# Patient Record
Sex: Female | Born: 1950
Health system: Southern US, Community
[De-identification: ages and names within clinical notes are randomized; demographics above are authoritative.]

## PROBLEM LIST (undated history)

## (undated) DIAGNOSIS — Z9889 Other specified postprocedural states: Secondary | ICD-10-CM

## (undated) DIAGNOSIS — F329 Major depressive disorder, single episode, unspecified: Secondary | ICD-10-CM

## (undated) DIAGNOSIS — K649 Unspecified hemorrhoids: Secondary | ICD-10-CM

## (undated) DIAGNOSIS — N189 Chronic kidney disease, unspecified: Secondary | ICD-10-CM

## (undated) DIAGNOSIS — T7840XA Allergy, unspecified, initial encounter: Secondary | ICD-10-CM

## (undated) DIAGNOSIS — R112 Nausea with vomiting, unspecified: Secondary | ICD-10-CM

## (undated) DIAGNOSIS — K219 Gastro-esophageal reflux disease without esophagitis: Secondary | ICD-10-CM

## (undated) DIAGNOSIS — E785 Hyperlipidemia, unspecified: Secondary | ICD-10-CM

## (undated) DIAGNOSIS — F32A Depression, unspecified: Secondary | ICD-10-CM

## (undated) DIAGNOSIS — M545 Low back pain, unspecified: Secondary | ICD-10-CM

## (undated) DIAGNOSIS — R011 Cardiac murmur, unspecified: Secondary | ICD-10-CM

## (undated) DIAGNOSIS — I341 Nonrheumatic mitral (valve) prolapse: Secondary | ICD-10-CM

## (undated) DIAGNOSIS — I493 Ventricular premature depolarization: Secondary | ICD-10-CM

## (undated) DIAGNOSIS — I10 Essential (primary) hypertension: Secondary | ICD-10-CM

## (undated) DIAGNOSIS — M1990 Unspecified osteoarthritis, unspecified site: Secondary | ICD-10-CM

## (undated) DIAGNOSIS — D649 Anemia, unspecified: Secondary | ICD-10-CM

## (undated) DIAGNOSIS — F419 Anxiety disorder, unspecified: Secondary | ICD-10-CM

## (undated) DIAGNOSIS — L509 Urticaria, unspecified: Secondary | ICD-10-CM

## (undated) HISTORY — PX: OTHER SURGICAL HISTORY: SHX169

## (undated) HISTORY — DX: Gastro-esophageal reflux disease without esophagitis: K21.9

## (undated) HISTORY — PX: HEMORRHOID SURGERY: SHX153

## (undated) HISTORY — DX: Urticaria, unspecified: L50.9

## (undated) HISTORY — DX: Unspecified osteoarthritis, unspecified site: M19.90

## (undated) HISTORY — DX: Ventricular premature depolarization: I49.3

## (undated) HISTORY — DX: Essential (primary) hypertension: I10

## (undated) HISTORY — DX: Major depressive disorder, single episode, unspecified: F32.9

## (undated) HISTORY — DX: Unspecified hemorrhoids: K64.9

## (undated) HISTORY — DX: Anemia, unspecified: D64.9

## (undated) HISTORY — DX: Low back pain, unspecified: M54.50

## (undated) HISTORY — DX: Low back pain: M54.5

## (undated) HISTORY — DX: Nonrheumatic mitral (valve) prolapse: I34.1

## (undated) HISTORY — DX: Allergy, unspecified, initial encounter: T78.40XA

## (undated) HISTORY — DX: Cardiac murmur, unspecified: R01.1

## (undated) HISTORY — DX: Hyperlipidemia, unspecified: E78.5

## (undated) HISTORY — DX: Depression, unspecified: F32.A

## (undated) HISTORY — PX: TONSILLECTOMY: SUR1361

## (undated) HISTORY — DX: Anxiety disorder, unspecified: F41.9

---

## 1982-11-12 HISTORY — PX: ABDOMINAL HYSTERECTOMY: SHX81

## 1999-04-21 ENCOUNTER — Other Ambulatory Visit: Admission: RE | Admit: 1999-04-21 | Discharge: 1999-04-21 | Payer: Self-pay | Admitting: Gynecology

## 1999-07-10 ENCOUNTER — Ambulatory Visit (HOSPITAL_COMMUNITY): Admission: RE | Admit: 1999-07-10 | Discharge: 1999-07-10 | Payer: Self-pay | Admitting: Gynecology

## 1999-07-10 ENCOUNTER — Encounter: Payer: Self-pay | Admitting: Gynecology

## 1999-12-27 ENCOUNTER — Encounter: Admission: RE | Admit: 1999-12-27 | Discharge: 2000-03-26 | Payer: Self-pay | Admitting: *Deleted

## 2000-01-31 ENCOUNTER — Encounter: Payer: Self-pay | Admitting: *Deleted

## 2000-01-31 ENCOUNTER — Encounter: Admission: RE | Admit: 2000-01-31 | Discharge: 2000-01-31 | Payer: Self-pay | Admitting: *Deleted

## 2000-03-27 ENCOUNTER — Encounter: Admission: RE | Admit: 2000-03-27 | Discharge: 2000-04-29 | Payer: Self-pay | Admitting: *Deleted

## 2000-06-14 ENCOUNTER — Other Ambulatory Visit: Admission: RE | Admit: 2000-06-14 | Discharge: 2000-06-14 | Payer: Self-pay | Admitting: Gynecology

## 2000-07-02 ENCOUNTER — Ambulatory Visit (HOSPITAL_COMMUNITY)
Admission: RE | Admit: 2000-07-02 | Discharge: 2000-07-02 | Payer: Self-pay | Admitting: Physical Medicine & Rehabilitation

## 2000-07-11 ENCOUNTER — Ambulatory Visit (HOSPITAL_COMMUNITY): Admission: RE | Admit: 2000-07-11 | Discharge: 2000-07-11 | Payer: Self-pay | Admitting: Gynecology

## 2000-07-11 ENCOUNTER — Encounter
Admission: RE | Admit: 2000-07-11 | Discharge: 2000-08-23 | Payer: Self-pay | Admitting: Physical Medicine & Rehabilitation

## 2000-07-11 ENCOUNTER — Encounter: Payer: Self-pay | Admitting: Gynecology

## 2000-11-27 ENCOUNTER — Emergency Department (HOSPITAL_COMMUNITY): Admission: EM | Admit: 2000-11-27 | Discharge: 2000-11-28 | Payer: Self-pay | Admitting: Internal Medicine

## 2000-12-06 ENCOUNTER — Ambulatory Visit (HOSPITAL_COMMUNITY)
Admission: RE | Admit: 2000-12-06 | Discharge: 2000-12-06 | Payer: Self-pay | Admitting: Physical Medicine & Rehabilitation

## 2000-12-06 ENCOUNTER — Encounter: Payer: Self-pay | Admitting: Physical Medicine & Rehabilitation

## 2001-07-01 ENCOUNTER — Other Ambulatory Visit: Admission: RE | Admit: 2001-07-01 | Discharge: 2001-07-01 | Payer: Self-pay | Admitting: Gynecology

## 2002-06-23 ENCOUNTER — Emergency Department (HOSPITAL_COMMUNITY): Admission: EM | Admit: 2002-06-23 | Discharge: 2002-06-23 | Payer: Self-pay | Admitting: Emergency Medicine

## 2002-07-16 LAB — HM COLONOSCOPY: HM Colonoscopy: ABNORMAL

## 2002-08-02 ENCOUNTER — Emergency Department (HOSPITAL_COMMUNITY): Admission: EM | Admit: 2002-08-02 | Discharge: 2002-08-02 | Payer: Self-pay | Admitting: Emergency Medicine

## 2002-08-27 ENCOUNTER — Other Ambulatory Visit: Admission: RE | Admit: 2002-08-27 | Discharge: 2002-08-27 | Payer: Self-pay | Admitting: Gynecology

## 2002-09-11 ENCOUNTER — Encounter: Payer: Self-pay | Admitting: Gynecology

## 2002-09-11 ENCOUNTER — Ambulatory Visit (HOSPITAL_COMMUNITY): Admission: RE | Admit: 2002-09-11 | Discharge: 2002-09-11 | Payer: Self-pay | Admitting: Gynecology

## 2002-11-12 HISTORY — PX: ROTATOR CUFF REPAIR: SHX139

## 2003-02-19 ENCOUNTER — Emergency Department (HOSPITAL_COMMUNITY): Admission: EM | Admit: 2003-02-19 | Discharge: 2003-02-19 | Payer: Self-pay | Admitting: Emergency Medicine

## 2003-09-22 ENCOUNTER — Encounter: Admission: RE | Admit: 2003-09-22 | Discharge: 2003-11-12 | Payer: Self-pay | Admitting: Orthopedic Surgery

## 2003-10-19 ENCOUNTER — Other Ambulatory Visit: Admission: RE | Admit: 2003-10-19 | Discharge: 2003-10-19 | Payer: Self-pay | Admitting: Gynecology

## 2003-11-13 ENCOUNTER — Encounter: Admission: RE | Admit: 2003-11-13 | Discharge: 2004-01-13 | Payer: Self-pay | Admitting: Orthopedic Surgery

## 2004-08-30 ENCOUNTER — Ambulatory Visit (HOSPITAL_COMMUNITY): Admission: RE | Admit: 2004-08-30 | Discharge: 2004-08-30 | Payer: Self-pay | Admitting: Gynecology

## 2004-09-19 ENCOUNTER — Ambulatory Visit: Payer: Self-pay | Admitting: Internal Medicine

## 2004-11-02 ENCOUNTER — Other Ambulatory Visit: Admission: RE | Admit: 2004-11-02 | Discharge: 2004-11-02 | Payer: Self-pay | Admitting: Gynecology

## 2004-12-19 ENCOUNTER — Ambulatory Visit: Payer: Self-pay | Admitting: Internal Medicine

## 2005-02-28 ENCOUNTER — Ambulatory Visit: Payer: Self-pay | Admitting: Internal Medicine

## 2005-08-30 ENCOUNTER — Ambulatory Visit: Payer: Self-pay | Admitting: Internal Medicine

## 2005-09-05 ENCOUNTER — Ambulatory Visit (HOSPITAL_COMMUNITY): Admission: RE | Admit: 2005-09-05 | Discharge: 2005-09-05 | Payer: Self-pay | Admitting: Gynecology

## 2006-02-28 ENCOUNTER — Ambulatory Visit: Payer: Self-pay | Admitting: Internal Medicine

## 2006-03-19 ENCOUNTER — Ambulatory Visit: Payer: Self-pay | Admitting: Professional

## 2006-04-02 ENCOUNTER — Ambulatory Visit: Payer: Self-pay | Admitting: Professional

## 2006-04-16 ENCOUNTER — Ambulatory Visit: Payer: Self-pay | Admitting: Professional

## 2006-09-10 ENCOUNTER — Ambulatory Visit (HOSPITAL_COMMUNITY): Admission: RE | Admit: 2006-09-10 | Discharge: 2006-09-10 | Payer: Self-pay | Admitting: Gynecology

## 2006-11-11 ENCOUNTER — Emergency Department (HOSPITAL_COMMUNITY): Admission: EM | Admit: 2006-11-11 | Discharge: 2006-11-11 | Payer: Self-pay | Admitting: Family Medicine

## 2006-11-21 ENCOUNTER — Ambulatory Visit: Payer: Self-pay | Admitting: Internal Medicine

## 2006-11-21 LAB — CONVERTED CEMR LAB
Chol/HDL Ratio, serum: 4.9
Cholesterol: 202 mg/dL (ref 0–200)
HDL: 40.9 mg/dL (ref >39.0)
TSH: 1.14 microintl units/mL (ref 0.35–5.50)
Triglyceride fasting, serum: 123 mg/dL (ref 0–149)
VLDL: 25 mg/dL (ref 0–40)

## 2006-12-05 ENCOUNTER — Ambulatory Visit: Payer: Self-pay | Admitting: Internal Medicine

## 2007-03-13 HISTORY — PX: KNEE SURGERY: SHX244

## 2007-03-15 ENCOUNTER — Ambulatory Visit (HOSPITAL_COMMUNITY): Admission: RE | Admit: 2007-03-15 | Discharge: 2007-03-15 | Payer: Self-pay | Admitting: Orthopedic Surgery

## 2007-04-10 ENCOUNTER — Ambulatory Visit (HOSPITAL_COMMUNITY): Admission: RE | Admit: 2007-04-10 | Discharge: 2007-04-10 | Payer: Self-pay | Admitting: Orthopedic Surgery

## 2007-06-10 ENCOUNTER — Emergency Department (HOSPITAL_COMMUNITY): Admission: EM | Admit: 2007-06-10 | Discharge: 2007-06-10 | Payer: Self-pay | Admitting: Family Medicine

## 2007-07-16 ENCOUNTER — Ambulatory Visit: Payer: Self-pay | Admitting: Internal Medicine

## 2007-09-12 ENCOUNTER — Ambulatory Visit (HOSPITAL_COMMUNITY): Admission: RE | Admit: 2007-09-12 | Discharge: 2007-09-12 | Payer: Self-pay | Admitting: Gynecology

## 2007-09-30 ENCOUNTER — Encounter: Payer: Self-pay | Admitting: Internal Medicine

## 2007-09-30 ENCOUNTER — Ambulatory Visit: Payer: Self-pay | Admitting: Internal Medicine

## 2007-09-30 DIAGNOSIS — F4321 Adjustment disorder with depressed mood: Secondary | ICD-10-CM | POA: Insufficient documentation

## 2007-09-30 DIAGNOSIS — I10 Essential (primary) hypertension: Secondary | ICD-10-CM

## 2007-09-30 DIAGNOSIS — H9209 Otalgia, unspecified ear: Secondary | ICD-10-CM | POA: Insufficient documentation

## 2007-09-30 DIAGNOSIS — F411 Generalized anxiety disorder: Secondary | ICD-10-CM

## 2007-09-30 DIAGNOSIS — F419 Anxiety disorder, unspecified: Secondary | ICD-10-CM | POA: Insufficient documentation

## 2007-09-30 LAB — CONVERTED CEMR LAB
ALT: 11 units/L (ref 0–35)
Albumin: 3.9 g/dL (ref 3.5–5.2)
Alkaline Phosphatase: 72 units/L (ref 39–117)
BUN: 10 mg/dL (ref 6–23)
Basophils Absolute: 0 10*3/uL (ref 0.0–0.1)
Basophils Relative: 0.8 % (ref 0.0–1.0)
CO2: 30 meq/L (ref 19–32)
Calcium: 9.4 mg/dL (ref 8.4–10.5)
Cholesterol: 229 mg/dL (ref 0–200)
Creatinine, Ser: 0.8 mg/dL (ref 0.4–1.2)
GFR calc Af Amer: 95 mL/min
HDL: 49.7 mg/dL (ref >39.0)
Hemoglobin: 12.1 g/dL (ref 12.0–15.0)
MCHC: 33.8 g/dL (ref 30.0–36.0)
Monocytes Absolute: 0.6 10*3/uL (ref 0.2–0.7)
Monocytes Relative: 12.6 % — ABNORMAL HIGH (ref 3.0–11.0)
Neutro Abs: 2.7 10*3/uL (ref 1.4–7.7)
Platelets: 210 10*3/uL (ref 150–400)
Potassium: 3.8 meq/L (ref 3.5–5.1)
RDW: 13.6 % (ref 11.5–14.6)
TSH: 1.38 microintl units/mL (ref 0.35–5.50)
Total CHOL/HDL Ratio: 4.6
VLDL: 20 mg/dL (ref 0–40)

## 2007-10-01 ENCOUNTER — Encounter: Payer: Self-pay | Admitting: Internal Medicine

## 2007-10-02 ENCOUNTER — Telehealth: Payer: Self-pay | Admitting: Internal Medicine

## 2008-02-13 ENCOUNTER — Ambulatory Visit: Payer: Self-pay | Admitting: Internal Medicine

## 2008-02-13 DIAGNOSIS — J309 Allergic rhinitis, unspecified: Secondary | ICD-10-CM | POA: Insufficient documentation

## 2008-02-13 DIAGNOSIS — M25519 Pain in unspecified shoulder: Secondary | ICD-10-CM

## 2008-02-26 ENCOUNTER — Emergency Department (HOSPITAL_COMMUNITY): Admission: EM | Admit: 2008-02-26 | Discharge: 2008-02-26 | Payer: Self-pay | Admitting: Emergency Medicine

## 2008-03-30 ENCOUNTER — Ambulatory Visit: Payer: Self-pay | Admitting: Internal Medicine

## 2008-03-30 DIAGNOSIS — M25569 Pain in unspecified knee: Secondary | ICD-10-CM

## 2008-04-09 ENCOUNTER — Ambulatory Visit: Payer: Self-pay | Admitting: Internal Medicine

## 2008-04-09 DIAGNOSIS — H6690 Otitis media, unspecified, unspecified ear: Secondary | ICD-10-CM | POA: Insufficient documentation

## 2008-04-09 DIAGNOSIS — R42 Dizziness and giddiness: Secondary | ICD-10-CM

## 2008-06-15 ENCOUNTER — Ambulatory Visit: Payer: Self-pay | Admitting: Internal Medicine

## 2008-09-24 ENCOUNTER — Ambulatory Visit: Payer: Self-pay | Admitting: Internal Medicine

## 2008-09-26 LAB — CONVERTED CEMR LAB
CO2: 30 meq/L (ref 19–32)
Chloride: 107 meq/L (ref 96–112)
Glucose, Bld: 110 mg/dL — ABNORMAL HIGH (ref 70–99)
Potassium: 3.7 meq/L (ref 3.5–5.1)
Sodium: 142 meq/L (ref 135–145)

## 2008-10-12 ENCOUNTER — Ambulatory Visit: Payer: Self-pay | Admitting: Internal Medicine

## 2008-11-12 DIAGNOSIS — F419 Anxiety disorder, unspecified: Secondary | ICD-10-CM

## 2008-11-12 HISTORY — DX: Anxiety disorder, unspecified: F41.9

## 2009-01-10 ENCOUNTER — Telehealth: Payer: Self-pay | Admitting: Internal Medicine

## 2009-02-16 ENCOUNTER — Ambulatory Visit: Payer: Self-pay | Admitting: Internal Medicine

## 2009-02-16 LAB — CONVERTED CEMR LAB
BUN: 13 mg/dL (ref 6–23)
CO2: 32 meq/L (ref 19–32)
Chloride: 103 meq/L (ref 96–112)
GFR calc non Af Amer: 82.25 mL/min (ref >60)
Glucose, Bld: 109 mg/dL — ABNORMAL HIGH (ref 70–99)
Potassium: 3.9 meq/L (ref 3.5–5.1)
Sodium: 142 meq/L (ref 135–145)

## 2009-02-24 ENCOUNTER — Ambulatory Visit: Payer: Self-pay | Admitting: Internal Medicine

## 2009-02-24 DIAGNOSIS — E559 Vitamin D deficiency, unspecified: Secondary | ICD-10-CM

## 2009-02-28 LAB — CONVERTED CEMR LAB
AST: 24 units/L (ref 0–37)
Albumin: 4.4 g/dL (ref 3.5–5.2)
Alkaline Phosphatase: 76 units/L (ref 39–117)
Basophils Relative: 0.2 % (ref 0.0–3.0)
CO2: 32 meq/L (ref 19–32)
Calcium: 10 mg/dL (ref 8.4–10.5)
Chloride: 100 meq/L (ref 96–112)
Eosinophils Absolute: 0.1 10*3/uL (ref 0.0–0.7)
HCT: 36.9 % (ref 36.0–46.0)
Hemoglobin: 12.5 g/dL (ref 12.0–15.0)
Lymphocytes Relative: 38.3 % (ref 12.0–46.0)
Lymphs Abs: 2.1 10*3/uL (ref 0.7–4.0)
MCHC: 33.9 g/dL (ref 30.0–36.0)
Neutro Abs: 2.7 10*3/uL (ref 1.4–7.7)
Potassium: 3.4 meq/L — ABNORMAL LOW (ref 3.5–5.1)
RBC: 4.6 M/uL (ref 3.87–5.11)
Sodium: 139 meq/L (ref 135–145)
Total Protein: 8.1 g/dL (ref 6.0–8.3)
Vitamin B-12: 397 pg/mL (ref 211–911)

## 2009-05-18 ENCOUNTER — Telehealth: Payer: Self-pay | Admitting: Internal Medicine

## 2009-06-02 ENCOUNTER — Telehealth: Payer: Self-pay | Admitting: Internal Medicine

## 2009-07-05 ENCOUNTER — Ambulatory Visit: Payer: Self-pay | Admitting: Internal Medicine

## 2009-09-07 ENCOUNTER — Ambulatory Visit (HOSPITAL_COMMUNITY): Admission: RE | Admit: 2009-09-07 | Discharge: 2009-09-07 | Payer: Self-pay | Admitting: Gynecology

## 2009-10-19 ENCOUNTER — Ambulatory Visit: Payer: Self-pay | Admitting: Internal Medicine

## 2009-10-20 LAB — CONVERTED CEMR LAB
BUN: 12 mg/dL (ref 6–23)
Basophils Absolute: 0.1 10*3/uL (ref 0.0–0.1)
Calcium: 9.3 mg/dL (ref 8.4–10.5)
Eosinophils Absolute: 0.1 10*3/uL (ref 0.0–0.7)
GFR calc non Af Amer: 94.01 mL/min (ref >60)
Glucose, Bld: 116 mg/dL — ABNORMAL HIGH (ref 70–99)
Lymphocytes Relative: 37.8 % (ref 12.0–46.0)
MCHC: 33.2 g/dL (ref 30.0–36.0)
Neutrophils Relative %: 45 % (ref 43.0–77.0)
RDW: 13.5 % (ref 11.5–14.6)
Sodium: 147 meq/L — ABNORMAL HIGH (ref 135–145)

## 2009-10-25 ENCOUNTER — Ambulatory Visit: Payer: Self-pay | Admitting: Internal Medicine

## 2009-10-25 DIAGNOSIS — M545 Low back pain: Secondary | ICD-10-CM | POA: Insufficient documentation

## 2009-10-25 DIAGNOSIS — R7309 Other abnormal glucose: Secondary | ICD-10-CM

## 2009-11-05 ENCOUNTER — Emergency Department (HOSPITAL_COMMUNITY): Admission: EM | Admit: 2009-11-05 | Discharge: 2009-11-05 | Payer: Self-pay | Admitting: Emergency Medicine

## 2009-11-07 ENCOUNTER — Emergency Department (HOSPITAL_COMMUNITY): Admission: EM | Admit: 2009-11-07 | Discharge: 2009-11-07 | Payer: Self-pay | Admitting: Emergency Medicine

## 2009-12-15 ENCOUNTER — Ambulatory Visit: Payer: Self-pay | Admitting: Internal Medicine

## 2009-12-15 DIAGNOSIS — R634 Abnormal weight loss: Secondary | ICD-10-CM

## 2009-12-15 DIAGNOSIS — J209 Acute bronchitis, unspecified: Secondary | ICD-10-CM | POA: Insufficient documentation

## 2009-12-29 ENCOUNTER — Telehealth: Payer: Self-pay | Admitting: Internal Medicine

## 2010-01-09 ENCOUNTER — Telehealth: Payer: Self-pay | Admitting: Internal Medicine

## 2010-02-23 ENCOUNTER — Ambulatory Visit: Payer: Self-pay | Admitting: Internal Medicine

## 2010-02-23 LAB — CONVERTED CEMR LAB
BUN: 18 mg/dL (ref 6–23)
Basophils Absolute: 0.1 10*3/uL (ref 0.0–0.1)
Basophils Relative: 1.2 % (ref 0.0–3.0)
CO2: 30 meq/L (ref 19–32)
Calcium: 9.5 mg/dL (ref 8.4–10.5)
Chloride: 102 meq/L (ref 96–112)
Creatinine, Ser: 1 mg/dL (ref 0.4–1.2)
Eosinophils Absolute: 0.2 10*3/uL (ref 0.0–0.7)
Eosinophils Relative: 4.3 % (ref 0.0–5.0)
GFR calc non Af Amer: 72.58 mL/min (ref >60)
Glucose, Bld: 107 mg/dL — ABNORMAL HIGH (ref 70–99)
HCT: 34.9 % — ABNORMAL LOW (ref 36.0–46.0)
Hemoglobin: 11.7 g/dL — ABNORMAL LOW (ref 12.0–15.0)
Iron: 53 ug/dL (ref 42–145)
Lymphocytes Relative: 34.8 % (ref 12.0–46.0)
Lymphs Abs: 1.8 10*3/uL (ref 0.7–4.0)
MCHC: 33.4 g/dL (ref 30.0–36.0)
MCV: 79.1 fL (ref 78.0–100.0)
Monocytes Absolute: 0.8 10*3/uL (ref 0.1–1.0)
Monocytes Relative: 15.1 % — ABNORMAL HIGH (ref 3.0–12.0)
Neutro Abs: 2.3 10*3/uL (ref 1.4–7.7)
Neutrophils Relative %: 44.6 % (ref 43.0–77.0)
Platelets: 200 10*3/uL (ref 150.0–400.0)
Potassium: 4.4 meq/L (ref 3.5–5.1)
RBC: 4.41 M/uL (ref 3.87–5.11)
RDW: 14.6 % (ref 11.5–14.6)
Saturation Ratios: 16 % — ABNORMAL LOW (ref 20.0–50.0)
Sodium: 140 meq/L (ref 135–145)
Transferrin: 236 mg/dL (ref 212.0–360.0)
WBC: 5.2 10*3/uL (ref 4.5–10.5)

## 2010-03-01 ENCOUNTER — Ambulatory Visit: Payer: Self-pay | Admitting: Internal Medicine

## 2010-03-30 ENCOUNTER — Ambulatory Visit: Payer: Self-pay | Admitting: Internal Medicine

## 2010-03-30 DIAGNOSIS — M6530 Trigger finger, unspecified finger: Secondary | ICD-10-CM | POA: Insufficient documentation

## 2010-06-29 ENCOUNTER — Ambulatory Visit: Payer: Self-pay | Admitting: Internal Medicine

## 2010-06-29 LAB — CONVERTED CEMR LAB
BUN: 16 mg/dL (ref 6–23)
Chloride: 101 meq/L (ref 96–112)
Hgb A1c MFr Bld: 5.7 % (ref 4.6–6.5)
Potassium: 4.6 meq/L (ref 3.5–5.1)
Sodium: 141 meq/L (ref 135–145)

## 2010-07-05 ENCOUNTER — Ambulatory Visit: Payer: Self-pay | Admitting: Internal Medicine

## 2010-09-08 ENCOUNTER — Ambulatory Visit (HOSPITAL_COMMUNITY): Admission: RE | Admit: 2010-09-08 | Discharge: 2010-09-08 | Payer: Self-pay | Admitting: Gynecology

## 2010-10-24 ENCOUNTER — Ambulatory Visit: Payer: Self-pay | Admitting: Internal Medicine

## 2010-10-24 LAB — CONVERTED CEMR LAB
AST: 16 units/L (ref 0–37)
Alkaline Phosphatase: 66 units/L (ref 39–117)
BUN: 16 mg/dL (ref 6–23)
Basophils Absolute: 0 10*3/uL (ref 0.0–0.1)
Calcium: 9.6 mg/dL (ref 8.4–10.5)
Cholesterol: 243 mg/dL — ABNORMAL HIGH (ref 0–200)
Creatinine, Ser: 1 mg/dL (ref 0.4–1.2)
Direct LDL: 160 mg/dL
Eosinophils Absolute: 0.2 10*3/uL (ref 0.0–0.7)
GFR calc non Af Amer: 72.42 mL/min (ref >60.00)
Glucose, Bld: 101 mg/dL — ABNORMAL HIGH (ref 70–99)
HDL: 44.4 mg/dL (ref >39.00)
Ketones, ur: NEGATIVE mg/dL
Leukocytes, UA: NEGATIVE
Lymphocytes Relative: 41.9 % (ref 12.0–46.0)
Lymphs Abs: 2 10*3/uL (ref 0.7–4.0)
MCHC: 33.4 g/dL (ref 30.0–36.0)
Monocytes Relative: 13.4 % — ABNORMAL HIGH (ref 3.0–12.0)
Platelets: 220 10*3/uL (ref 150.0–400.0)
RDW: 14.1 % (ref 11.5–14.6)
Specific Gravity, Urine: 1.02 (ref 1.000–1.030)
TSH: 0.89 microintl units/mL (ref 0.35–5.50)
Total Bilirubin: 0.5 mg/dL (ref 0.3–1.2)
Triglycerides: 167 mg/dL — ABNORMAL HIGH (ref 0.0–149.0)
Urobilinogen, UA: 0.2 (ref 0.0–1.0)
pH: 5.5 (ref 5.0–8.0)

## 2010-10-31 ENCOUNTER — Ambulatory Visit: Payer: Self-pay | Admitting: Internal Medicine

## 2010-10-31 ENCOUNTER — Encounter: Payer: Self-pay | Admitting: Internal Medicine

## 2010-10-31 DIAGNOSIS — E785 Hyperlipidemia, unspecified: Secondary | ICD-10-CM

## 2010-10-31 LAB — CONVERTED CEMR LAB
BUN: 14 mg/dL (ref 6–23)
Chloride: 103 meq/L (ref 96–112)
Hgb A1c MFr Bld: 5.8 % (ref 4.6–6.5)
Potassium: 4.1 meq/L (ref 3.5–5.1)
Sodium: 140 meq/L (ref 135–145)

## 2010-11-07 LAB — CONVERTED CEMR LAB
IgE (Immunoglobulin E), Serum: 4.2 intl units/mL (ref 0.0–180.0)
Vit D, 25-Hydroxy: 32 ng/mL (ref 30–89)

## 2010-11-26 ENCOUNTER — Emergency Department (HOSPITAL_COMMUNITY)
Admission: EM | Admit: 2010-11-26 | Discharge: 2010-11-26 | Payer: Self-pay | Source: Home / Self Care | Admitting: Family Medicine

## 2010-12-03 ENCOUNTER — Encounter: Payer: Self-pay | Admitting: Gynecology

## 2010-12-11 ENCOUNTER — Ambulatory Visit
Admission: RE | Admit: 2010-12-11 | Discharge: 2010-12-11 | Payer: Self-pay | Source: Home / Self Care | Attending: Internal Medicine | Admitting: Internal Medicine

## 2010-12-11 ENCOUNTER — Encounter: Payer: Self-pay | Admitting: Internal Medicine

## 2010-12-12 NOTE — Assessment & Plan Note (Signed)
Summary: 4 MO ROV /NWS  #   Vital Signs:  Patient profile:   60 year old female Height:      63 inches Weight:      177 pounds BMI:     31.47 O2 Sat:      98 % on Room air Temp:     99.0 degrees F oral Pulse rate:   72 / minute Pulse rhythm:   regular Resp:     16 per minute BP sitting:   120 / 82  (left arm) Cuff size:   regular  Vitals Entered By: Lanier Prude, CMA(AAMA) (July 05, 2010 8:07 AM)  O2 Flow:  Room air  Procedure Note  Injections: The patient complains of pain and inflammation. Indication: chronic pain Consent signed: yes  Procedure # 1: joint injection    Region: lateral    Location: R shoulder    Technique: 25 g needle    Medication: 40 mg depomedrol    Anesthesia: 3.0 ml 1% lidocaine w/o epinephrine    Comment: Risks including but not limited by incomplete procedure, bleeding, infection, recurrence were discussed with the patient. Consent form was signed. Tolerated well. Complicatons - none. Good pain relief following the procedure.   Cleaned and prepped with: alcohol and betadine Wound dressing: bandaid  CC: 4 mo f/u Is Patient Diabetic? No   Primary Care Provider:  Tresa Garter MD  CC:  4 mo f/u.  History of Present Illness: The patient presents for a follow up of HTN, anxiety, depression and R shoulder pain.   Current Medications (verified): 1)  Azor 10-40 Mg  Tabs (Amlodipine-Olmesartan) .... Take 1 Tab By Mouth Every Day 2)  Maxzide-25 37.5-25 Mg  Tabs (Triamterene-Hctz) .[name].. 1 By Mouth Qd 3)  Lorazepam 1 Mg  Tabs (Lorazepam) .[name].. 1 Two Times A Day Prn 4)  Hydrocodone-Acetaminophen 5-500 Mg  Tabs (Hydrocodone-Acetaminophen) .[name].. 1 By Mouth Two Times A Day As Needed Pain 5)  Promethazine Hcl 25 Mg  Tabs (Promethazine Hcl) .[name].. 1 By Mouth Qid As Needed Nausea 6)  Vitamin D3 1000 Unit  Tabs (Cholecalciferol) .[name].. 1 Qd 7)  Tramadol Hcl 50 Mg  Tabs (Tramadol Hcl) .[name].. 1-2 By Mouth Two Times A Day As Needed Pain 8)  Klor-Con M10 10  Meq Cr-Tabs (Potassium Chloride Crys Cr) .[name].. 1 By Mouth Qd 9)  Ferro-Bob 325 (65 Fe) Mg Tabs (Ferrous Sulfate) .[name].. 1 By Mouth Once Daily For Anemia 10)  Pennsaid 1.5 % Soln (Diclofenac Sodium) .... 3-5 Gtt On Skin Three Times A Day For Pain 11)  Meclizine Hcl 12.5 Mg Tabs (Meclizine Hcl) .[name].. 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    Physical Exam  General:  NAD, tired Eyes:  No corneal or conjunctival inflammation noted. EOMI. Perrla. Funduscopic exam benign, without hemorrhages, exudates or papilledema. Vision grossly normal. Ears:  External ear exam shows no significant lesions or deformities.  Otoscopic examination reveals clear canals, tympanic membranes are intact bilaterally without bulging, retraction, inflammation or discharge. Hearing is grossly normal bilaterally. Mouth:  Oral mucosa and oropharynx without lesions or exudates.  Teeth in good repair. Neck:  No deformities, masses, or tenderness noted. Lungs:  Normal respiratory effort, chest expands symmetrically. Lungs are clear to auscultation, no crackles or wheezes. Heart:  Normal rate and regular rhythm. S1 and S2 normal without gallop, click, rub or other extra sounds. 1-2/6 systolic heart murmur  Abdomen:  Bowel sounds positive,abdomen soft and non-tender without masses, organomegaly or hernias noted. Msk:   R shoulder w/restricted due to pain ROM Neurologic:  No cranial nerve deficits noted. Station and gait are normal. Plantar reflexes are down-going bilaterally. DTRs are symmetrical throughout. Sensory, motor and coordinative functions appear intact. Skin:  Intact without suspicious lesions or rashes 1 cm pigmented macula on  R shoulder Psych:  Oriented X3, good eye contact, not anxious appearing, and not depressed appearing.     Impression & Recommendations:  Problem # 1:  SHOULDER PAIN (ICD-719.41) R Assessment Deteriorated  Her updated medication list for this problem includes:    Hydrocodone-acetaminophen 5-500 Mg Tabs (Hydrocodone-acetaminophen) .[name]... 1 by mouth two times a day as needed pain    Tramadol Hcl 50 Mg Tabs (Tramadol hcl) .[name]... 1-2 by mouth two times a day as needed pain    Ibuprofen 600 Mg Tabs (Ibuprofen) .[name]... 1 by mouth bid  pc x 1 wk then as needed for  pain Dr Rennis Chris if not better  Orders: Joint Aspirate / Injection, Large (20610) Depo- Medrol 40mg  (J1030)  Complete Medication List: 1)  Azor 10-40 Mg Tabs (Amlodipine-olmesartan) .... Take 1 tab by mouth every day 2)  Maxzide-25 37.5-25 Mg Tabs (Triamterene-hctz) .[name].. 1 by mouth qd 3)  Lorazepam 1 Mg Tabs (Lorazepam) .[name].. 1 two times a day prn 4)  Hydrocodone-acetaminophen 5-500 Mg Tabs (Hydrocodone-acetaminophen) .[name].. 1 by mouth two times a day as needed pain 5)  Promethazine Hcl 25 Mg Tabs (Promethazine hcl) .[name].. 1 by mouth qid as needed nausea 6)  Vitamin D3 1000 Unit Tabs (Cholecalciferol) .[name].. 1 qd 7)  Tramadol Hcl 50 Mg Tabs (Tramadol hcl) .[name].. 1-2 by mouth two times a day as needed pain 8)  Klor-con M10 10 Meq Cr-tabs (Potassium chloride crys cr) .[name].. 1 by mouth qd 9)  Ferro-bob 325 (65 Fe) Mg Tabs (Ferrous sulfate) .[name].. 1 by mouth once daily for anemia 10)  Pennsaid 1.5 % Soln (Diclofenac sodium) .... 3-5 gtt on skin three times a day for pain 11)  Meclizine Hcl 12.5 Mg Tabs (Meclizine hcl) .[name].. 1-2 by  mouth qid as needed dizziness 12)  Ibuprofen 600 Mg Tabs (Ibuprofen) .[name].. 1 by mouth bid  pc x 1 wk then as needed for  pain  Other Orders: Admin 1st Vaccine (83151) Flu Vaccine 2yrs + (76160)  Patient Instructions: 1)  Please schedule a follow-up appointment in 4 months well w/labs. Prescriptions: IBUPROFEN 600 MG TABS (IBUPROFEN) 1 by mouth bid  pc x 1 wk then as needed for  pain  #60 x 3   Entered and Authorized by:   Tresa Garter MD   Signed by:   Tresa Garter MD on 07/05/2010   Method used:   [facility]        [facility]  [place] [id number]       [address]       [place], [place]  [address]  Ph: [phone] or [phone]       Fax: [phone]   RxID:   [id number]     .lbflu   Flu Vaccine Consent Questions     Do you have a history of severe allergic reactions to this vaccine? no    Any prior history of allergic reactions to egg and/or gelatin? no    Do you have a sensitivity to the preservative Thimersol? no    Do you have a past history of Guillan-Barre Syndrome? no    Do you currently have an acute febrile illness? no    Have you ever had a severe reaction to latex? no    Vaccine information given and explained to patient? yes    Are you currently pregnant? no    Lot Number:[id number]   Exp Date:05/12/2011   Site Given  Left Deltoid IM Lanier Prude, [facility])  July 05, 2010 9:48 AM   Addendum:  Pt moved arm away while receiving 1st flu inj. Needle scratched skin on left deltoid. Less than half of vaccine was injected.  Gave pt 2nd injection and advised her to clean scrape with soap and water and notify us if redness/swelling becomes an issue.  Pt understands. Lanier Prude, [facility])  July 05, 2010 9:32 AM

## 2010-12-12 NOTE — Assessment & Plan Note (Signed)
Summary: PERSISTANT COLD/#/CD   Vital Signs:  Patient profile:   60 year old female Weight:      173 pounds BMI:     30.76 Temp:     98.5 degrees F oral Pulse rate:   89 / minute BP sitting:   142 / 84  (left arm)  Vitals Entered By: Tora Perches (December 15, 2009 8:38 AM) CC: ongoing cough Is Patient Diabetic? No   Primary Care Provider:  Tresa Garter MD  CC:  ongoing cough.  History of Present Illness: The patient presents with complaints of sore throat, fever, cough, sinus congestion and drainge of several wks duration. Not better with OTC meds. Chest hurts with coughing. Can't sleep due to cough. Muscle aches are not  present.  The mucus is not colored. She was in ER in the end of Dec and had a CXR.    Preventive Screening-Counseling & Management  Alcohol-Tobacco     Smoking Status: never  Current Medications (verified): 1)  Azor 10-40 Mg  Tabs (Amlodipine-Olmesartan) .... Take 1 Tab By Mouth Every Day 2)  Maxzide-25 37.5-25 Mg  Tabs (Triamterene-Hctz) .[name].. 1 By Mouth Qd 3)  Lorazepam 1 Mg  Tabs (Lorazepam) .[name].. 1 Two Times A Day Prn 4)  Hydrocodone-Acetaminophen 5-500 Mg  Tabs (Hydrocodone-Acetaminophen) .[name].. 1 By Mouth Two Times A Day As Needed Pain 5)  Promethazine Hcl 25 Mg  Tabs (Promethazine Hcl) .[name].. 1 By Mouth Qid As Needed Nausea 6)  Vitamin D3 1000 Unit  Tabs (Cholecalciferol) .[name].. 1 Qd 7)  Tramadol Hcl 50 Mg  Tabs (Tramadol Hcl) .[name].. 1-2 By Mouth Two Times A Day As Needed Pain 8)  Klor-Con M10 10 Meq Cr-Tabs (Potassium Chloride Crys Cr) .[name].. 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  The patient denies abdominal pain and melena.    Physical Exam  General:  NAD, tired Ears:  External ear exam shows no significant lesions or deformities.  Otoscopic examination reveals clear canals, tympanic membranes are intact bilaterally without bulging, retraction, inflammation or discharge. Hearing is grossly normal bilaterally. Nose:  External nasal examination shows no deformity or inflammation. Nasal mucosa are pink and moist without lesions or exudates. Mouth:  Oral mucosa and oropharynx without lesions or exudates.  Teeth in good repair. Neck:  No deformities, masses, or tenderness noted. Lungs:  Normal respiratory effort, chest expands symmetrically. Lungs are clear to auscultation, no crackles or wheezes. Heart:  Normal rate and regular rhythm. S1 and S2 normal without gallop, click, rub or other extra sounds. 1-2/6 systolic heart murmur  Abdomen:  Bowel sounds positive,abdomen soft and non-tender without masses, organomegaly or hernias noted. Extremities:  No clubbing, cyanosis, edema, or deformity noted with normal full range of motion of all joints.   Skin:  Intact without suspicious lesions or rashes Cervical Nodes:  No lymphadenopathy noted   Impression & Recommendations:  Problem # 1:  BRONCHITIS, ACUTE (ICD-466.0) Assessment Deteriorated  Her updated medication list for this problem includes:    Tessalon Perles 100 Mg Caps (Benzonatate) .[name]... 1-2 by mouth two times a day as needed  cogh    Zithromax Z-pak 250 Mg Tabs (Azithromycin) .[name]... As dirrected    Dulera 100-5 Mcg/act Aero (Mometasone furo-formoterol fum) .[name]... 1 inh bid  Problem # 2:  WEIGHT LOSS (ICD-783.21) due to #1 Assessment: New Will watch. Call if not better  Complete Medication List: 1)  Azor 10-40 Mg Tabs (Amlodipine-olmesartan) .... Take 1 tab by mouth every day 2)  Maxzide-25 37.5-25 Mg Tabs (Triamterene-hctz) .[name].. 1 by mouth qd 3)  Lorazepam 1 Mg Tabs (Lorazepam)  .[name].. 1 two times a day prn 4)  Hydrocodone-acetaminophen 5-500 Mg Tabs (Hydrocodone-acetaminophen) .[name].. 1 by mouth two times a day as needed pain 5)  Promethazine Hcl 25 Mg Tabs (Promethazine hcl) .[name].. 1 by mouth qid as needed nausea 6)  Vitamin D3 1000 Unit Tabs (Cholecalciferol) .[name].. 1 qd 7)  Tramadol Hcl 50 Mg Tabs (Tramadol hcl) .[name].. 1-2 by mouth two times a day as needed pain 8)  Klor-con M10 10 Meq Cr-tabs (Potassium chloride crys cr) .[name].. 1 by mouth qd 9)  Tessalon Perles 100 Mg Caps (Benzonatate) .[name].. 1-2 by mouth two times a day as needed cogh 10)  Zithromax Z-pak 250 Mg Tabs (Azithromycin) .... As dirrected 11)  Dulera 100-5 Mcg/act Aero (Mometasone furo-formoterol fum) .[name].. 1 inh bid  Patient Instructions: 1)  Call if you are not better in a reasonable amount of time or if worse.  Prescriptions: DULERA 100-5 MCG/ACT AERO (MOMETASONE FURO-FORMOTEROL FUM) 1 inh bid  #1 x 3   Entered and Authorized by:   Tresa Garter MD   Signed by:   Tresa Garter MD on 12/15/2009   Method used:   Print then Give to Patient   RxID:   [id number] ZITHROMAX Z-PAK 250 MG TABS (AZITHROMYCIN) as dirrected  #1 x 0   Entered and Authorized by:   Tresa Garter MD   Signed by:   Tresa Garter MD on 12/15/2009   Method used:   [facility]        [facility]  [place] [id number]       [address]       [place], [place]  [address]       Ph: [phone] or [phone]       Fax: [phone]   RxID:   [id number] TESSALON PERLES 100 MG CAPS (BENZONATATE) 1-2 by mouth two times a day as needed cogh  #120 x 1   Entered and Authorized by:   Tresa Garter MD   Signed by:   Tresa Garter MD on 12/15/2009   Method used:   [facility]        [facility]  [place] (retail)       [address]       [place], [place]  [address]       Ph: [phone] or [phone]       Fax: [phone]   RxID:   [id number]

## 2010-12-12 NOTE — Assessment & Plan Note (Signed)
Summary: 4 MO ROV /NWS $50   Vital Signs:  Patient profile:   60 year old female Weight:      185 pounds Temp:     98.4 degrees F oral Pulse rate:   71 / minute BP sitting:   126 / 64  (left arm)  Vitals Entered By: Tora Perches (July 05, 2009 8:24 AM) CC: f/u Is Patient Diabetic? No   Primary Care Provider:  Tresa Garter MD  CC:  f/u.  History of Present Illness: The patient presents for a follow up of hypertension, anxiety, knee OA   Current Medications (verified): 1)  Azor 10-40 Mg  Tabs (Amlodipine-Olmesartan) .... Take 1 Tab By Mouth Every Day 2)  Maxzide-25 37.5-25 Mg  Tabs (Triamterene-Hctz) .[name].. 1 By Mouth Qd 3)  Lorazepam 1 Mg  Tabs (Lorazepam) .[name].. 1 Two Times A Day Prn 4)  Hydrocodone-Acetaminophen 5-500 Mg  Tabs (Hydrocodone-Acetaminophen) .[name].. 1 By Mouth Two Times A Day As Needed Pain 5)  Meclizine Hcl 12.5 Mg Tabs (Meclizine Hcl) .[name].. 1-2 By Mouth Qid As Needed [place] 6)  Promethazine Hcl 25 Mg  Tabs (Promethazine Hcl) .[name].. 1 By Mouth Qid As Needed Nausea 7)  Vitamin D3 1000 Unit  Tabs (Cholecalciferol) .[name].. 1 Qd 8)  Tramadol Hcl 50 Mg  Tabs (Tramadol Hcl) .[name].. 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   Eyes:  No corneal or conjunctival inflammation noted. EOMI. Perrla. Funduscopic exam benign, without  hemorrhages, exudates or papilledema. Vision grossly normal. Mouth:  Oral mucosa and oropharynx without lesions or exudates.  Teeth in good repair. Neck:  No deformities, masses, or tenderness noted. Lungs:  Normal respiratory effort, chest expands symmetrically. Lungs are clear to auscultation, no crackles or wheezes. Heart:  Normal rate and regular rhythm. S1 and S2 normal without gallop, murmur, click, rub or other extra sounds. Abdomen:  Bowel sounds positive,abdomen soft and non-tender without masses, organomegaly or hernias noted. Msk:  B knees are tender w/ROM Extremities:  no edema B Neurologic:  No cranial nerve deficits noted. Station and gait are normal. Plantar reflexes are down-going bilaterally. DTRs are symmetrical throughout. Sensory, motor and coordinative functions appear intact. Skin:  Intact without suspicious lesions or rashes   Impression & Recommendations:  Problem # 1:  HYPERTENSION (ICD-401.9) Assessment Comment Only  Her updated medication list for this problem includes:    Azor 10-40 Mg Tabs (Amlodipine-olmesartan) .[name]... Take 1 tab by mouth every day    Maxzide-25 37.5-25 Mg Tabs (Triamterene-hctz) .[name]... 1 by mouth qd  BP today: 126/64 Prior BP: 144/80 (02/24/2009)  Labs Reviewed: K+: 3.4 (02/24/2009) Creat: : 0.9 (02/24/2009)   Chol: 229 (09/30/2007)   HDL: 49.7 (09/30/2007)   LDL: DEL (09/30/2007)   TG: 100 (09/30/2007)  Problem # 2:  DEPRESSION (ICD-311) Assessment: Improved  Her updated medication list for this problem includes:    Lorazepam 1 Mg Tabs (Lorazepam) .[name]... 1 two  times a day prn  Problem # 3:  ANXIETY (ICD-300.00) Assessment: Comment Only  Her updated medication list for this problem includes:    Lorazepam 1 Mg Tabs (Lorazepam) .[name]... 1 two times a day prn  Problem # 4:  ALLERGIC RHINITIS (ICD-477.9) Assessment: Comment Only  Her updated medication list for this problem includes:    Promethazine Hcl 25 Mg Tabs (Promethazine hcl)  .[name]... 1 by mouth qid as needed nausea  Complete Medication List: 1)  Azor 10-40 Mg Tabs (Amlodipine-olmesartan) .... Take 1 tab by mouth every day 2)  Maxzide-25 37.5-25 Mg Tabs (Triamterene-hctz) .[name].. 1 by mouth qd 3)  Lorazepam 1 Mg Tabs (Lorazepam) .[name].. 1 two times a day prn 4)  Hydrocodone-acetaminophen 5-500 Mg Tabs (Hydrocodone-acetaminophen) .[name].. 1 by mouth two times a day as needed pain 5)  Meclizine Hcl 12.5 Mg Tabs (Meclizine hcl) .[name].. 1-2 by mouth qid as needed verigo 6)  Promethazine Hcl 25 Mg Tabs (Promethazine hcl) .[name].. 1 by mouth qid as needed nausea 7)  Vitamin D3 1000 Unit Tabs (Cholecalciferol) .[name].. 1 qd 8)  Tramadol Hcl 50 Mg Tabs (Tramadol hcl) .[name].. 1-2 by mouth two times a day as needed pain  Patient Instructions: 1)  Please schedule a follow-up appointment in 4 months. 2)  Try to eat more raw plant food, fresh and dry fruit, raw almonds, leafy vegetables, whole foods and less red meat, less animal fat. Poultry and fish is better for you than pork and beef. Avoid processed foods (canned soups, hot dogs, sausage, bacon , frozen dinners). Avoid corn syrup, high fructose syrup or aspartam and Splenda  containing drinks. Honey, Agave and Stevia are better sweeteners. Make your own  dressing with olive oil, wine vinegar, lemon juce, garlic etc. for your salads. 3)  [email or web address] 4)  Start taking a yoga class 5)  BMP prior to visit, ICD-9:401.1 6)  TSH prior to visit, ICD-9: 7)  CBC w/ Diff prior to visit, ICD-9: Prescriptions: HYDROCODONE-ACETAMINOPHEN 5-500 MG  TABS (HYDROCODONE-ACETAMINOPHEN) 1 by mouth two times a day as needed pain  #60 x 1   Entered and Authorized by:   Tresa Garter MD   Signed by:   Tresa Garter MD on 07/05/2009   Method used:   Print then Give to Patient   RxID:   [id number] MAXZIDE-25 37.5-25 MG  TABS (TRIAMTERENE-HCTZ) 1 by mouth qd  #30 Tablet x 12   Entered and Authorized by:   Tresa Garter MD   Signed by:    Tresa Garter MD on 07/05/2009   Method used:   Print then Give to Patient   RxID:   [id number]

## 2010-12-12 NOTE — Letter (Signed)
Summary: Generic Letter  [facility]  [address] [place], [place] [address]   Phone: [phone]  Fax: [phone]    07/05/2010  [id number] [place] [address] [place], [place]  [address]  Ms. Gorczyca' goal weight should be 160 lbs           Sincerely,   Jacinta Shoe MD

## 2010-12-12 NOTE — Miscellaneous (Signed)
Summary: Special Procedure/[facility]  Special Procedure/[facility]   Imported By: Lester [place] 07/07/2010 10:44:19  _____________________________________________________________________  External Attachment:    Type:   Image     Comment:   External Document

## 2010-12-12 NOTE — Progress Notes (Signed)
  Phone Note Call from Patient Call back at [facility] Phone [phone]   Summary of Call: Pt left vm that pharm has faxed [place] with no response but did not give name of medication she needed.  Initial call taken by: Lamar Sprinkles, CMA,  January 09, 2010 3:19 PM    Prescriptions: LORAZEPAM 1 MG  TABS (LORAZEPAM) 1 two times a day prn  #60 x 6   Entered by:   Ami Bullins CMA   Authorized by:   Tresa Garter MD   Signed by:   Bill Salinas CMA on 01/10/2010   Method used:   Telephoned to ...       [facility]  [place]       [address]       [place], [place]  [address]       Ph: [phone] or [phone]       Fax: [phone]   RxID:   [id number] LORAZEPAM 1 MG  TABS (LORAZEPAM) 1 two times a day prn  #60 x 6   Entered by:   Ami Bullins CMA   Authorized by:   A LIM Triage Nurse   Signed by:   Bill Salinas CMA on 01/10/2010   Method used:   Telephoned to ...       [facility]  [place] [facility]retail)       [address]       [place], [place]  [address]       Ph: [phone] or [phone]       Fax: [phone]   RxID:   [id number]  Pt does not use [facility], Lorazepam prescription was canceled at that Pharm/ ab

## 2010-12-12 NOTE — Progress Notes (Signed)
Summary: Lorazepam refill  Phone Note Refill Request Message from:  Fax from Pharmacy on December 29, 2009 11:01 AM  Refills Requested: Medication #1:  LORAZEPAM 1 MG  TABS 1 two times a day prn Next Appointment Scheduled: 02-21-10 Initial call taken by: Lucious Groves,  December 29, 2009 11:01 AM  Follow-up for Phone Call        ok 6 ref Follow-up by: Tresa Garter MD,  December 29, 2009 1:21 PM    Prescriptions: LORAZEPAM 1 MG  TABS (LORAZEPAM) 1 two times a day prn  #60 x 6   Entered by:   Rock Nephew CMA   Authorized by:   Tresa Garter MD   Signed by:   Rock Nephew CMA on 12/29/2009   Method used:   Telephoned to ...       [facility] [id number] (retail)       [address] [place], [place]  [address]       Ph: [phone]       Fax: [phone]   RxID:   [id number]

## 2010-12-12 NOTE — Assessment & Plan Note (Signed)
Summary: 4 MO ROV /NWS  #   Vital Signs:  Patient profile:   60 year old female Height:      63 inches Weight:      180 pounds BMI:     32.00 O2 Sat:      98 % on Room air Temp:     98.4 degrees F oral Pulse rate:   67 / minute BP sitting:   124 / 66  (left arm) Cuff size:   regular  Vitals Entered By: Lucious Groves (March 01, 2010 8:23 AM)  O2 Flow:  Room air CC: 4 mo rtn ov. Pt would like rx for Vertigo./kb Is Patient Diabetic? No Pain Assessment Patient in pain? no        Primary Care Provider:  Georgina Quint Plotnikov MD  CC:  4 mo rtn ov. Pt would like rx for Vertigo./kb.  History of Present Illness: The patient presents for a follow up of hypertension, OA, hyperlipidemia, elev glu. C/o R shoulder pain 2 wks  Current Medications (verified): 1)  Azor 10-40 Mg  Tabs (Amlodipine-Olmesartan) .... Take 1 Tab By Mouth Every Day 2)  Maxzide-25 37.5-25 Mg  Tabs (Triamterene-Hctz) .[name].. 1 By Mouth Qd 3)  Lorazepam 1 Mg  Tabs (Lorazepam) .[name].. 1 Two Times A Day Prn 4)  Hydrocodone-Acetaminophen 5-500 Mg  Tabs (Hydrocodone-Acetaminophen) .[name].. 1 By Mouth Two Times A Day As Needed Pain 5)  Promethazine Hcl 25 Mg  Tabs (Promethazine Hcl) .[name].. 1 By Mouth Qid As Needed Nausea 6)  Vitamin D3 1000 Unit  Tabs (Cholecalciferol) .[name].. 1 Qd 7)  Tramadol Hcl 50 Mg  Tabs (Tramadol Hcl) .[name].. 1-2 By Mouth Two Times A Day As Needed Pain 8)  Klor-Con M10 10 Meq Cr-Tabs (Potassium Chloride Crys Cr) .[name].. 1 By Mouth Qd 9)  Tessalon Perles 100 Mg Caps (Benzonatate) .[name].. 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    Physical Exam  General:  NAD, tired Nose:  External nasal examination shows no deformity or inflammation. Nasal mucosa are pink and moist without lesions or exudates. Mouth:  Oral mucosa and oropharynx without lesions or exudates.  Teeth in good repair. Lungs:  Normal respiratory effort, chest expands symmetrically. Lungs are clear to auscultation, no crackles or wheezes. Heart:  Normal rate and regular rhythm. S1 and S2 normal without gallop, click, rub or other extra sounds. 1-2/6 systolic heart murmur  Abdomen:  Bowel sounds positive,abdomen soft and non-tender without masses, organomegaly or hernias noted. Msk:  B knees are less tender w/ROM R shoulder is tender in subacromial space Extremities:  No clubbing, cyanosis, edema, or deformity noted with normal full range of motion of all joints.   Neurologic:  No cranial nerve deficits noted. Station and gait are normal. Plantar reflexes are down-going bilaterally. DTRs are symmetrical throughout.  Sensory, motor and coordinative functions appear intact. Skin:  Intact without suspicious lesions or rashes Psych:  Oriented X3, good eye contact, not anxious appearing, and not depressed appearing.     Impression & Recommendations:  Problem # 1:  SHOULDER PAIN (ICD-719.41) R rot cuff tendonitis Assessment New Stretch, ROM exercise Will inject if not better Her updated medication list for this problem includes:    Hydrocodone-acetaminophen 5-500 Mg Tabs (Hydrocodone-acetaminophen) .[name]... 1 by mouth two times a day as needed pain    Tramadol Hcl 50 Mg Tabs (Tramadol hcl) .[name]... 1-2 by mouth two times a day as  needed pain  Problem # 2:  LOW BACK PAIN (ICD-724.2) Assessment: Unchanged  Her updated medication list for this problem includes:    Hydrocodone-acetaminophen 5-500 Mg Tabs (Hydrocodone-acetaminophen) .[name]... 1 by mouth two times a day as needed pain    Tramadol Hcl 50 Mg Tabs (Tramadol hcl) .[name]... 1-2 by mouth two times a day as needed pain  Problem # 3:  HYPERGLYCEMIA (ICD-790.29) Assessment: Unchanged  Problem # 4:  DEPRESSION (ICD-311) Assessment: Improved  Her updated medication list for this problem includes:    Lorazepam 1 Mg Tabs (Lorazepam) .[name]... 1 two times a day prn  Complete Medication List: 1)  Azor 10-40 Mg Tabs (Amlodipine-olmesartan) .... Take 1 tab by mouth every day 2)  Maxzide-25 37.5-25 Mg Tabs (Triamterene-hctz) .[name].. 1 by mouth qd 3)  Lorazepam 1 Mg Tabs (Lorazepam) .[name].. 1 two times a day prn 4)  Hydrocodone-acetaminophen 5-500 Mg Tabs (Hydrocodone-acetaminophen) .[name].. 1 by mouth two times a day as needed pain 5)  Promethazine Hcl 25 Mg Tabs (Promethazine hcl) .[name].. 1 by mouth qid as needed nausea 6)  Vitamin D3 1000 Unit Tabs (Cholecalciferol) .[name].. 1 qd 7)  Tramadol Hcl 50 Mg Tabs (Tramadol hcl) .[name].. 1-2 by mouth two times a day as needed pain 8)  Klor-con M10 10 Meq Cr-tabs (Potassium chloride crys cr) .[name].. 1 by mouth qd 9)  Ferro-bob 325 (65 Fe) Mg Tabs (Ferrous sulfate) .[name].. 1 by mouth once daily for anemia 10)  Pennsaid 1.5 % Soln (Diclofenac sodium) .... 3-5 gtt on skin three times a day for pain 11)  Meclizine Hcl 12.5 Mg Tabs (Meclizine hcl) .[name].. 1-2 by mouth qid as needed dizziness  Patient Instructions: 1)  Please schedule a follow-up appointment in 4 months. 2)  BMP prior to visit, ICD-9:790.29 3)  HbgA1C prior to visit, ICD-9: Prescriptions: MECLIZINE HCL 12.5 MG TABS (MECLIZINE HCL) 1-2 by mouth qid as needed dizziness  #60 x 1   Entered and Authorized by:   Tresa Garter MD   Signed by:   Tresa Garter MD on 03/01/2010   Method used:    Print then Give to Patient   RxID:   [id number] PENNSAID 1.5 % SOLN (DICLOFENAC SODIUM) 3-5 gtt on skin three times a day for pain  #1 x 3   Entered and Authorized by:   Tresa Garter MD   Signed by:   Tresa Garter MD on 03/01/2010   Method used:   Print then Give to Patient   RxID:   [id number] HYDROCODONE-ACETAMINOPHEN 5-500 MG  TABS (HYDROCODONE-ACETAMINOPHEN) 1 by mouth two times a day as needed pain  #180 x 3   Entered and Authorized by:   Tresa Garter MD   Signed by:   Tresa Garter MD on 03/01/2010   Method used:   Print then Give to Patient   RxID:   [id number]  LORAZEPAM 1 MG  TABS (LORAZEPAM) 1 two times a day prn  #180 x 1   Entered and Authorized by:   Tresa Garter MD   Signed by:   Tresa Garter MD on 03/01/2010   Method used:   Print then Give to Patient   RxID:   [id number] TRAMADOL HCL 50 MG  TABS (TRAMADOL HCL) 1-2 by mouth two times a day as needed pain  #120 x 4   Entered and Authorized by:   Tresa Garter MD   Signed by:   Tresa Garter MD on 03/01/2010   Method used:   Electronically to        [facility]* (mail-order)             ,          Ph: [phone]       Fax: [phone]   RxID:   [id number] KLOR-CON M10 10 MEQ CR-TABS (POTASSIUM CHLORIDE CRYS CR) 1 by mouth qd  #90 x 3   Entered and Authorized by:   Tresa Garter MD   Signed by:   Tresa Garter MD on 03/01/2010   Method used:   Electronically to        [facility]* (mail-order)             ,          Ph: [phone]       Fax: [phone]   RxID:   [id number] MAXZIDE-25 37.5-25 MG  TABS (TRIAMTERENE-HCTZ) 1 by mouth qd  #90 x 3   Entered and Authorized by:   Tresa Garter MD   Signed by:   Tresa Garter MD on 03/01/2010   Method used:   Electronically to        [facility]* (mail-order)             ,          Ph: [phone]       Fax: [phone]   RxID:    [id number] AZOR 10-40 MG  TABS (AMLODIPINE-OLMESARTAN) Take 1 tab by mouth every day  #90 x 3   Entered and Authorized by:   Tresa Garter MD   Signed by:   Tresa Garter MD on 03/01/2010   Method used:   Electronically to        [facility]* (mail-order)             ,          Ph: [phone]       Fax: [phone]   RxID:   [id number] FERRO-BOB 325 (65 FE) MG TABS (FERROUS SULFATE) 1 by mouth once daily for anemia  #90 x 1   Entered and Authorized by:   Tresa Garter MD   Signed by:   Tresa Garter MD on 03/01/2010   Method used:   Electronically to        [facility]* (mail-order)             ,          Ph: [phone]       Fax: [phone]   RxID:   [id number]

## 2010-12-12 NOTE — Miscellaneous (Signed)
Summary: Procedure/[facility]  Procedure/[facility]   Imported By: Lester Brevard 04/05/2010 10:37:42  _____________________________________________________________________  External Attachment:    Type:   Image     Comment:   External Document

## 2010-12-12 NOTE — Assessment & Plan Note (Signed)
Summary: SHOULDER PAIN/ CORTISONE SHOT? /NWS   Vital Signs:  Patient profile:   60 year old female Height:      63 inches Weight:      176 pounds BMI:     31.29 O2 Sat:      99 % on Room air Temp:     98.6 degrees F oral Pulse rate:   71 / minute BP sitting:   108 / 70  (left arm) Cuff size:   regular  Vitals Entered By: Lucious Groves (Mar 30, 2010 11:22 AM)  O2 Flow:  Room air  Procedure Note  Injections: The patient complains of pain and inflammation. Indication: chronic pain Consent signed: yes  Procedure # 1: joint injection    Region: lateral    Location: R shoulder    Technique: 24 g needle    Medication: 40 mg depomedrol    Anesthesia: 3.0 ml 1% lidocaine w/o epinephrine  Procedure # 2: tendon sheath injection    Region: palmar    Location: right hand    Technique: 29 g needle    Medication: 10 mg depomedrol    Anesthesia: 1.0 ml 1% lidocaine w/o epinephrine    Comment: Risks including but not limited by incomplete procedure, bleeding, infection, recurrence were discussed with the patient. Consent form was signed. Tolerated well. Complicatons - none. Good pain relief following the procedurs.   Cleaned and prepped with: alcohol and betadine Wound dressing: bandaid  CC: C/O continued right shoulder pain. Requests injection./kb Is Patient Diabetic? No Pain Assessment Patient in pain? yes     Location: shoulder Onset of pain  couple of months ago   Primary Care Provider:  Tresa Garter MD  CC:  C/O continued right shoulder pain. Requests injection./kb.  History of Present Illness: R shoulder pain - worse, she is asking for a shot C/o R finger triggering x days - bad  Current Medications (verified): 1)  Azor 10-40 Mg  Tabs (Amlodipine-Olmesartan) .... Take 1 Tab By Mouth Every Day 2)  Maxzide-25 37.5-25 Mg  Tabs (Triamterene-Hctz) .[name].. 1 By Mouth Qd 3)  Lorazepam 1 Mg  Tabs (Lorazepam) .[name].. 1 Two Times A Day Prn 4)  Hydrocodone-Acetaminophen  5-500 Mg  Tabs (Hydrocodone-Acetaminophen) .[name].. 1 By Mouth Two Times A Day As Needed Pain 5)  Promethazine Hcl 25 Mg  Tabs (Promethazine Hcl) .[name].. 1 By Mouth Qid As Needed Nausea 6)  Vitamin D3 1000 Unit  Tabs (Cholecalciferol) .[name].. 1 Qd 7)  Tramadol Hcl 50 Mg  Tabs (Tramadol Hcl) .[name].. 1-2 By Mouth Two Times A Day As Needed Pain 8)  Klor-Con M10 10 Meq Cr-Tabs (Potassium Chloride Crys Cr) .[name].. 1 By Mouth Qd 9)  Ferro-Bob 325 (65 Fe) Mg Tabs (Ferrous Sulfate) .[name].. 1 By Mouth Once Daily For Anemia 10)  Pennsaid 1.5 % Soln (Diclofenac Sodium) .... 3-5 Gtt On Skin Three Times A Day For Pain 11)  Meclizine Hcl 12.5 Mg Tabs (Meclizine Hcl) .[name].. 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 1 by mouth two times a day as needed pain    Tramadol Hcl 50 Mg Tabs (Tramadol hcl) .[name]... 1-2 by mouth two times a day as needed pain  Problem # 2:  TRIGGER FINGER (ICD-727.03) R Assessment: New Options discussd - she asked to have an injection of the flexor tendon  Complete Medication List: 1)  Azor 10-40 Mg Tabs (Amlodipine-olmesartan) .... Take 1 tab by mouth every day 2)  Maxzide-25  37.5-25 Mg Tabs (Triamterene-hctz) .[name].. 1 by mouth qd 3)  Lorazepam 1 Mg Tabs (Lorazepam) .[name].. 1 two times a day prn 4)  Hydrocodone-acetaminophen 5-500 Mg Tabs (Hydrocodone-acetaminophen) .[name].. 1 by mouth two times a day as needed pain 5)  Promethazine Hcl 25 Mg Tabs (Promethazine hcl) .[name].. 1 by mouth qid as needed nausea 6)  Vitamin D3 1000 Unit Tabs (Cholecalciferol) .[name].. 1 qd 7)  Tramadol Hcl 50 Mg Tabs (Tramadol hcl) .[name].. 1-2 by mouth two times a day as needed pain 8)  Klor-con M10 10 Meq Cr-tabs (Potassium chloride crys cr) .[name].. 1 by mouth qd 9)  Ferro-bob 325 (65 Fe) Mg Tabs (Ferrous sulfate) .[name].. 1 by mouth once daily for anemia 10)  Pennsaid 1.5 % Soln (Diclofenac sodium) .... 3-5 gtt on skin three times a day for pain 11)  Meclizine Hcl 12.5 Mg Tabs (Meclizine hcl) .[name].. 1-2 by mouth qid as needed dizziness  Other Orders: Joint Aspirate / Injection, Large (20610) Depo- Medrol 40mg  (J1030) Injection, Tendon / Ligament (16109) Depo-Medrol 20mg  (J1020)  Patient Instructions: 1)  Please schedule a follow-up appointment in 2 months. 2)  Call if you are not better in a reasonable amount of time or if worse.

## 2010-12-14 NOTE — Assessment & Plan Note (Signed)
Summary: 4 MO ROV /NWS  #   Vital Signs:  Patient profile:   60 year old female Height:      63 inches Weight:      181 pounds BMI:     32.18 Temp:     98.4 degrees F oral Pulse rate:   76 / minute Pulse rhythm:   regular Resp:     16 per minute BP sitting:   134 / 90  (left arm) Cuff size:   regular  Vitals Entered By: Lanier Prude, CMA(AAMA) (October 31, 2010 7:53 AM) CC: 4 mo f/u  Is Patient Diabetic? No Comments pt is not taking Hydroco, Promethazine, Tramadol, Pennsaid  or Meclizine.   Primary Care Provider:  Tresa Garter MD  CC:  4 mo f/u .  History of Present Illness: The patient presents for a follow up anxiety, depression and OA, HTN. C/o bad allergies  Current Medications (verified): 1)  Azor 10-40 Mg  Tabs (Amlodipine-Olmesartan) .... Take 1 Tab By Mouth Every Day 2)  Maxzide-25 37.5-25 Mg  Tabs (Triamterene-Hctz) .[name].. 1 By Mouth Qd 3)  Lorazepam 1 Mg  Tabs (Lorazepam) .[name].. 1 Two Times A Day Prn 4)  Hydrocodone-Acetaminophen 5-500 Mg  Tabs (Hydrocodone-Acetaminophen) .[name].. 1 By Mouth Two Times A Day As Needed Pain 5)  Promethazine Hcl 25 Mg  Tabs (Promethazine Hcl) .[name].. 1 By Mouth Qid As Needed Nausea 6)  Vitamin D3 1000 Unit  Tabs (Cholecalciferol) .[name].. 1 Qd 7)  Tramadol Hcl 50 Mg  Tabs (Tramadol Hcl) .[name].. 1-2 By Mouth Two Times A Day As Needed Pain 8)  Klor-Con M10 10 Meq Cr-Tabs (Potassium Chloride Crys Cr) .[name].. 1 By Mouth Qd 9)  Ferro-Bob 325 (65 Fe) Mg Tabs (Ferrous Sulfate) .[name].. 1 By Mouth Once Daily For Anemia 10)  Pennsaid 1.5 % Soln (Diclofenac Sodium) .... 3-5 Gtt On Skin Three Times A Day For Pain 11)  Meclizine Hcl 12.5 Mg Tabs (Meclizine Hcl) .[name].. 1-2 By Mouth Qid As Needed Dizziness 12)  Ibuprofen 600 Mg Tabs (Ibuprofen) .[name].. 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 Nasal mucosa is swollen Mouth:  Oral mucosa and oropharynx without lesions or exudates.  Teeth in good repair. Lungs:  Normal respiratory effort, chest expands symmetrically. Lungs are clear to auscultation, no crackles or wheezes. Heart:  Normal rate and regular rhythm. S1 and S2 normal without gallop, click, rub or other extra sounds. 1-2/6 systolic heart murmur  Abdomen:  Bowel sounds positive,abdomen soft and non-tender without masses, organomegaly or hernias noted. Msk:  R shoulder w/restricted due to pain ROM Extremities:  No clubbing, cyanosis, edema, or deformity noted with normal full range of motion of all joints.   Neurologic:  No cranial nerve deficits noted. Station and gait are normal. Plantar reflexes are down-going bilaterally. DTRs are symmetrical throughout. Sensory, motor and coordinative functions appear intact. Skin:  Intact without suspicious lesions or rashes 1 cm  pigmented macula on  R shoulder Psych:  Oriented X3, good eye contact, not anxious appearing, and not depressed appearing.     Impression & Recommendations:  Problem # 1:  HYPERTENSION (ICD-401.9) Assessment Unchanged  Her updated medication list for this problem includes:    Azor 10-40 Mg Tabs (Amlodipine-olmesartan) .[name]... Take 1 tab by mouth every day    Maxzide-25 37.5-25 Mg Tabs (Triamterene-hctz) .[name]... 1 by mouth qd  Orders: TLB-A1C / Hgb A1C (Glycohemoglobin) (83036-A1C) TLB-BMP (Basic Metabolic Panel-BMET) (80048-METABOL) T-Vitamin D (25-Hydroxy) (21308-65784)  Problem # 2:  ALLERGIC RHINITIS  (ICD-477.9) Assessment: Deteriorated  Her updated medication list for this problem includes:    Promethazine Hcl 25 Mg Tabs (Promethazine hcl) .[name]... 1 by mouth qid as needed nausea    Loratadine 10 Mg Tabs (Loratadine) .[name]... 1 by mouth once daily as needed allergies    Flonase 50 Mcg/act Susp (Fluticasone propionate) .[name]... 1 spr each nostr qd as needed    Sudafed 12 Hour 120 Mg Xr12h-tab (Pseudoephedrine hcl) .[name]... 1 by mouth two times a day as needed allergies  Orders: T-Allergy Profile Region II-[place], [place], [place], [place], [place] (5484) TLB-A1C / Hgb A1C (Glycohemoglobin) (83036-A1C) TLB-BMP (Basic Metabolic Panel-BMET) (80048-METABOL) T-Vitamin D (25-Hydroxy) (69629-52841)  Problem # 3:  LOW BACK PAIN (ICD-724.2) Assessment: Improved  Her updated medication list for this problem includes:    Hydrocodone-acetaminophen 5-500 Mg Tabs (Hydrocodone-acetaminophen) .[name]... 1 by mouth two times a day as needed pain    Tramadol Hcl 50 Mg Tabs (Tramadol hcl) .[name]... 1-2 by mouth two times a day as needed pain    Ibuprofen 600 Mg Tabs (Ibuprofen) .[name]... 1 by mouth bid  pc x 1 wk then as needed for  pain  Orders: TLB-A1C / Hgb A1C (Glycohemoglobin) (83036-A1C) TLB-BMP (Basic Metabolic Panel-BMET) (80048-METABOL) T-Vitamin D (25-Hydroxy) (32440-10272)  Problem # 4:  HYPERGLYCEMIA (ICD-790.29) Assessment: Comment Only  Orders: TLB-A1C / Hgb A1C (Glycohemoglobin) (83036-A1C) TLB-BMP (Basic Metabolic Panel-BMET) (80048-METABOL) T-Vitamin D (25-Hydroxy) (53664-40347)  Problem # 5:  HYPERLIPIDEMIA (ICD-272.4) Assessment: Comment Only Cont w/good diet Rx options discussed  Complete Medication List: 1)  Azor 10-40 Mg Tabs (Amlodipine-olmesartan) .... Take 1 tab by mouth every day 2)  Maxzide-25 37.5-25 Mg Tabs (Triamterene-hctz) .[name].. 1 by mouth qd 3)  Lorazepam 1 Mg Tabs (Lorazepam) .[name].. 1 two times a day prn 4)  Hydrocodone-acetaminophen 5-500 Mg Tabs (Hydrocodone-acetaminophen) .[name].. 1 by mouth two times a  day as needed pain 5)  Promethazine Hcl 25 Mg Tabs (Promethazine hcl) .[name].. 1 by mouth qid as needed nausea 6)  Vitamin D3 1000 Unit Tabs (Cholecalciferol) .[name].. 1 qd 7)  Tramadol Hcl 50 Mg Tabs (Tramadol hcl) .[name].. 1-2 by mouth two times a day as needed pain 8)  Klor-con M10 10 Meq Cr-tabs (Potassium chloride crys cr) .[name].. 1 by mouth qd 9)  Ferro-bob 325 (65 Fe) Mg Tabs (Ferrous sulfate) .[name].. 1 by mouth once daily for anemia 10)  Pennsaid 1.5 % Soln (Diclofenac sodium) .... 3-5 gtt on skin three times a day for pain 11)  Meclizine Hcl 12.5 Mg Tabs (Meclizine hcl) .[name].. 1-2 by mouth qid as needed dizziness 12)  Ibuprofen 600 Mg Tabs (Ibuprofen) .[name].. 1 by mouth bid  pc x 1 wk then as needed for  pain 13)  Loratadine 10 Mg Tabs (Loratadine) .[name].. 1 by mouth once daily as needed allergies 14)  Flonase 50 Mcg/act Susp (Fluticasone propionate) .[name].. 1 spr each nostr qd as needed 15)  Sudafed 12 Hour 120 Mg Xr12h-tab (  Pseudoephedrine hcl) .[name].. 1 by mouth two times a day as needed allergies  Patient Instructions: 1)  Please schedule a follow-up appointment in 4 months. 2)  Use the Sinus rinse as needed  Prescriptions: LORAZEPAM 1 MG  TABS (LORAZEPAM) 1 two times a day prn  #180 x 1   Entered and Authorized by:   Tresa Garter MD   Signed by:   Tresa Garter MD on 10/31/2010   Method used:   Print then Give to Patient   RxID:   [id number] MAXZIDE-25 37.5-25 MG  TABS (TRIAMTERENE-HCTZ) 1 by mouth qd  #90 x 3   Entered and Authorized by:   Tresa Garter MD   Signed by:   Tresa Garter MD on 10/31/2010   Method used:   Print then Give to Patient   RxID:   [id number] AZOR 10-40 MG  TABS (AMLODIPINE-OLMESARTAN) Take 1 tab by mouth every day  #90 x 3   Entered and Authorized by:   Tresa Garter MD   Signed by:   Tresa Garter MD on 10/31/2010   Method used:   Print then Give to Patient   RxID:   [id number] SUDAFED 12 HOUR 120 MG XR12H-TAB  (PSEUDOEPHEDRINE HCL) 1 by mouth two times a day as needed allergies  #60 x 1   Entered and Authorized by:   Tresa Garter MD   Signed by:   Tresa Garter MD on 10/31/2010   Method used:   Print then Give to Patient   RxID:   [id number] FLONASE 50 MCG/ACT SUSP (FLUTICASONE PROPIONATE) 1 spr each nostr qd as needed  #3 x 3   Entered and Authorized by:   Tresa Garter MD   Signed by:   Tresa Garter MD on 10/31/2010   Method used:   Print then Give to Patient   RxID:   [id number] LORATADINE 10 MG TABS (LORATADINE) 1 by mouth once daily as needed allergies  #30 x 6   Entered and Authorized by:   Tresa Garter MD   Signed by:   Tresa Garter MD on 10/31/2010   Method used:   Print then Give to Patient   RxID:   [id number]    Orders Added: 1)  T-Allergy Profile Region II-[place], [place], [place], [place], [place] [5484] 2)  TLB-A1C / Hgb A1C (Glycohemoglobin) [83036-A1C] 3)  TLB-BMP (Basic Metabolic Panel-BMET) [80048-METABOL] 4)  T-Vitamin D (25-Hydroxy) [09323-55732] 5)  Est. Patient Level IV [20254]

## 2010-12-20 NOTE — Miscellaneous (Signed)
Summary: RT index tendon injection/[facility]  RT index tendon injection/[facility]   Imported By: Sherian Rein 12/15/2010 08:56:45  _____________________________________________________________________  External Attachment:    Type:   Image     Comment:   External Document

## 2010-12-20 NOTE — Assessment & Plan Note (Signed)
Summary: hand pain/swelling/SD   Vital Signs:  Patient profile:   61 year old female Height:      63 inches Weight:      180 pounds BMI:     32.00 Temp:     98.5 degrees F oral Pulse rate:   84 / minute Pulse rhythm:   regular Resp:     16 per minute BP sitting:   130 / 80  (left arm) Cuff size:   regular  Vitals Entered By: Lanier Prude, CMA(AAMA) (December 11, 2010 2:40 PM)  Procedure Note  Injections: The patient complains of pain and tenderness. Indication: chronic pain Consent signed: yes  Procedure # 1: tendon sheath injection    Region: palmar    Location: R index finger flexor tendon    Technique: 25 g needle    Medication: 10 mg depomedrol    Anesthesia: 0.5 ml 1% lidocaine w/o epinephrine    Comment: Risks including but not limited by incomplete procedure, bleeding, infection, recurrence were discussed with the patient. Consent form was signed. Tendon sheath inj was performed in usual fasion. Tolerated well. Complicatons - none. Good pain relief following the procedure.   Cleaned and prepped with: alcohol and betadine Wound dressing: bandaid  CC: Rt hand swelling/pain and dizziness Is Patient Diabetic? No Comments pt is not taking Hydroc/APAP, Promethazine, Tramadol, Pennsaid, Meclizine, Ibuprofen or Ferro-bob   Primary Care Provider:  Tresa Garter MD  CC:  Rt hand swelling/pain and dizziness.  History of Present Illness: C/o R index finger triggering and being painful x wks C/o R earache and vertigo off and on x wks.  Current Medications (verified): 1)  Azor 10-40 Mg  Tabs (Amlodipine-Olmesartan) .... Take 1 Tab By Mouth Every Day 2)  Maxzide-25 37.5-25 Mg  Tabs (Triamterene-Hctz) .[name].. 1 By Mouth Qd 3)  Lorazepam 1 Mg  Tabs (Lorazepam) .[name].. 1 Two Times A Day Prn 4)  Hydrocodone-Acetaminophen 5-500 Mg  Tabs (Hydrocodone-Acetaminophen) .[name].. 1 By Mouth Two Times A Day As Needed Pain 5)  Promethazine Hcl 25 Mg  Tabs (Promethazine Hcl) .[name].. 1 By  Mouth Qid As Needed Nausea 6)  Vitamin D3 1000 Unit  Tabs (Cholecalciferol) .[name].. 1 Qd 7)  Tramadol Hcl 50 Mg  Tabs (Tramadol Hcl) .[name].. 1-2 By Mouth Two Times A Day As Needed Pain 8)  Klor-Con M10 10 Meq Cr-Tabs (Potassium Chloride Crys Cr) .[name].. 1 By Mouth Qd 9)  Ferro-Bob 325 (65 Fe) Mg Tabs (Ferrous Sulfate) .[name].. 1 By Mouth Once Daily For Anemia 10)  Pennsaid 1.5 % Soln (Diclofenac Sodium) .... 3-5 Gtt On Skin Three Times A Day For Pain 11)  Meclizine Hcl 12.5 Mg Tabs (Meclizine Hcl) .[name].. 1-2 By Mouth Qid As Needed Dizziness 12)  Ibuprofen 600 Mg Tabs (Ibuprofen) .[name].. 1 By Mouth Bid  Pc X 1 Wk Then As Needed For  Pain 13)  Loratadine 10 Mg Tabs (Loratadine) .[name].. 1 By Mouth Once Daily As Needed Allergies 14)  Flonase 50 Mcg/act Susp (Fluticasone Propionate) .[name].. 1 Spr Each Nostr Qd As Needed 15)  Sudafed 12 Hour 120 Mg Xr12h-Tab (Pseudoephedrine Hcl) .[name].. 1 By Mouth Two Times A Day As Needed Allergies 16)  Fish Oil 1000 Mg Caps (Omega-3 Fatty Acids) .[name].. 1 By Mouth Once Daily 17)  Caltrate 600 1500 Mg Tabs (Calcium Carbonate) .[name].. 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    Physical Exam  General:  NAD Ears:  External ear exam shows no significant lesions or deformities.  Otoscopic examination reveals clear canals, tympanic membranes are with fluid behind R TM. Hearing is grossly normal bilaterally. Mouth:  Oral mucosa and oropharynx without lesions or exudates.  Teeth  in good repair. Lungs:  Normal respiratory effort, chest expands symmetrically. Lungs are clear to auscultation, no crackles or wheezes. Heart:  Normal rate and regular rhythm. S1 and S2 normal without gallop, click, rub or other extra sounds. 1-2/6 systolic heart murmur  Abdomen:  Bowel sounds positive,abdomen soft and non-tender without masses, organomegaly or hernias noted. Msk:  R index finger is triggering Neurologic:  No cranial nerve deficits noted. Station and gait are normal. Plantar reflexes are down-going bilaterally. DTRs are symmetrical throughout. Sensory, motor and coordinative functions appear intact.   Impression & Recommendations:  Problem # 1:  TRIGGER FINGER (ICD-727.03) R index Assessment New Tendon inj  Problem # 2:  OTITIS MEDIA (ICD-382.9) R Assessment: New  Her updated medication list for this problem includes:    Ibuprofen 600 Mg Tabs (Ibuprofen) .[name]... 1 by mouth bid  pc x 1 wk then as needed for  pain    Augmentin 875-125 Mg Tabs (Amoxicillin-pot clavulanate) .[name]... 1 by mouth bid  Problem # 3:  VERTIGO (ICD-780.4)  Assessment: Deteriorated Treat #2. Consider brain imaging if not better Her updated medication list for this problem includes:    Promethazine Hcl 25 Mg Tabs (Promethazine hcl) .[name]... 1 by mouth qid as needed nausea    Meclizine Hcl 12.5 Mg Tabs (Meclizine hcl) .[name]... 1-2 by mouth qid as needed dizziness    Loratadine 10 Mg Tabs (Loratadine) .[name]... 1 by mouth once daily as needed allergies  Complete Medication List: 1)  Azor 10-40 Mg Tabs (Amlodipine-olmesartan) .... Take 1 tab by mouth every day 2)  Maxzide-25 37.5-25 Mg Tabs (Triamterene-hctz) .[name].. 1 by mouth qd 3)  Lorazepam 1 Mg Tabs (Lorazepam) .[name].. 1 two times a day prn 4)  Hydrocodone-acetaminophen 5-500 Mg Tabs (Hydrocodone-acetaminophen) .[name].. 1 by mouth two times a day as needed pain 5)  Promethazine Hcl 25 Mg Tabs (Promethazine hcl) .[name].. 1 by mouth qid as needed nausea 6)  Vitamin D3  1000 Unit Tabs (Cholecalciferol) .[name].. 1 qd 7)  Tramadol Hcl 50 Mg Tabs (Tramadol hcl) .[name].. 1-2 by mouth two times a day as needed pain 8)  Klor-con M10 10 Meq Cr-tabs (Potassium chloride crys cr) .[name].. 1 by mouth qd 9)  Ferro-bob 325 (65 Fe) Mg Tabs (Ferrous sulfate) .[name].. 1 by mouth once daily for anemia 10)  Pennsaid 1.5 % Soln (Diclofenac sodium) .... 3-5 gtt on skin three times a day for pain 11)  Meclizine Hcl 12.5 Mg Tabs (Meclizine hcl) .[name].. 1-2 by mouth qid as needed dizziness 12)  Ibuprofen 600 Mg Tabs (Ibuprofen) .[name].. 1 by mouth bid  pc x 1 wk then as needed for  pain 13)  Loratadine 10 Mg Tabs (Loratadine) .[name].. 1 by mouth once daily as needed allergies 14)  Flonase 50 Mcg/act Susp (Fluticasone propionate) .[name].. 1 spr each nostr qd as needed 15)  Sudafed 12 Hour  120 Mg Xr12h-tab (Pseudoephedrine hcl) .[name].. 1 by mouth two times a day as needed allergies 16)  Fish Oil 1000 Mg Caps (Omega-3 fatty acids) .[name].. 1 by mouth once daily 17)  Caltrate 600 1500 Mg Tabs (Calcium carbonate) .[name].. 1 by mouth once daily 18)  Augmentin 875-125 Mg Tabs (Amoxicillin-pot clavulanate) .[name].. 1 by mouth bid  Other Orders: Injection, Tendon / Ligament (21308) Depo-Medrol 20mg  (J1020)  Patient Instructions: 1)  Please schedule a follow-up appointment in 3 months. 2)  Call if you are not better in a reasonable amount of time or if worse.  Prescriptions: AUGMENTIN 875-125 MG TABS (AMOXICILLIN-POT CLAVULANATE) 1 by mouth bid  #20 x 1   Entered and Authorized by:   Tresa Garter MD   Signed by:   Tresa Garter MD on 12/11/2010   Method used:   Electronically to        [facility]  [address] (retail)       [address]       [place], [place]  [address]       Ph: [phone] or [phone]       Fax: [phone]   RxID:   [id number]    Orders Added: 1)  Est. Patient Level IV [51884] 2)  Injection, Tendon / Ligament [20550] 3)  Depo-Medrol 20mg  [J1020]

## 2011-03-02 ENCOUNTER — Ambulatory Visit (INDEPENDENT_AMBULATORY_CARE_PROVIDER_SITE_OTHER): Payer: 59 | Admitting: Internal Medicine

## 2011-03-02 ENCOUNTER — Encounter: Payer: Self-pay | Admitting: Internal Medicine

## 2011-03-02 VITALS — BP 120/90 | HR 72 | Temp 98.4°F | Resp 16 | Ht 63.5 in | Wt 180.0 lb

## 2011-03-02 DIAGNOSIS — F329 Major depressive disorder, single episode, unspecified: Secondary | ICD-10-CM

## 2011-03-02 DIAGNOSIS — J309 Allergic rhinitis, unspecified: Secondary | ICD-10-CM

## 2011-03-02 DIAGNOSIS — I10 Essential (primary) hypertension: Secondary | ICD-10-CM

## 2011-03-02 DIAGNOSIS — R7309 Other abnormal glucose: Secondary | ICD-10-CM

## 2011-03-02 MED ORDER — CITALOPRAM HYDROBROMIDE 20 MG PO TABS: 20.0000 mg | ORAL_TABLET | Freq: Every day | ORAL | Status: DC

## 2011-03-02 NOTE — Assessment & Plan Note (Addendum)
Worse. Stressed. Start Celexa.

## 2011-03-02 NOTE — Assessment & Plan Note (Signed)
On Rx 

## 2011-03-02 NOTE — Progress Notes (Signed)
  Subjective:    Patient ID: Jane Mooney, female    DOB: 03-22-1951, 60 y.o.   MRN: [id number]  HPI   The patient is here to follow up on chronic depression, anxiety, headaches and chronic moderate fibromyalgia symptoms controlled with medicines, diet and exercise. C/o more anxiety and stress.  Review of Systems  HENT: Negative for dental problem.   Cardiovascular: Negative for chest pain.  Gastrointestinal: Negative for nausea.  Musculoskeletal: Negative for back pain.  Psychiatric/Behavioral: Positive for sleep disturbance. Negative for suicidal ideas and dysphoric mood. The patient is nervous/anxious.    Wt Readings from Last 3 Encounters:  03/02/11 180 lb (81.647 kg)  12/11/10 180 lb (81.647 kg)  10/31/10 181 lb (82.101 kg)   BP Readings from Last 3 Encounters:  03/02/11 120/90  12/11/10 130/80  10/31/10 134/90        Objective:   Physical Exam  Constitutional: She appears well-developed and well-nourished. No distress.  HENT:  Head: Normocephalic.  Right Ear: External ear normal.  Left Ear: External ear normal.  Nose: Nose normal.  Mouth/Throat: Oropharynx is clear and moist.  Eyes: Conjunctivae are normal. Pupils are equal, round, and reactive to light. Right eye exhibits no discharge. Left eye exhibits no discharge.  Neck: Normal range of motion. Neck supple. No JVD present. No tracheal deviation present. No thyromegaly present.  Cardiovascular: Normal rate, regular rhythm and normal heart sounds.   Pulmonary/Chest: No stridor. No respiratory distress. She has no wheezes.  Abdominal: Soft. Bowel sounds are normal. She exhibits no distension and no mass. There is no tenderness. There is no rebound and no guarding.  Musculoskeletal: She exhibits no edema and no tenderness.  Lymphadenopathy:    She has no cervical adenopathy.  Neurological: She displays normal reflexes. No cranial nerve deficit. She exhibits normal muscle tone. Coordination normal.  Skin: No rash  noted. No erythema.  Psychiatric: She has a normal mood and affect. Her behavior is normal. Judgment and thought content normal.          Assessment & Plan:  DEPRESSION Worse. Stressed. Start Celexa.  ALLERGIC RHINITIS On Rx  HYPERGLYCEMIA Monitoring  HYPERTENSION Cont Rx

## 2011-03-02 NOTE — Assessment & Plan Note (Signed)
Cont Rx 

## 2011-03-02 NOTE — Assessment & Plan Note (Signed)
Monitoring

## 2011-03-27 NOTE — Op Note (Signed)
[name], Mooney                 ACCOUNT NO.:  [id number]   MEDICAL RECORD NO.:  [id number]          PATIENT TYPE:  AMB   LOCATION:  SDS                          FACILITY:  [facility]   PHYSICIAN:  Vania Rea. Supple, M.D.  DATE OF BIRTH:  [birth year]   DATE OF PROCEDURE:  04/10/2007  DATE OF DISCHARGE:                               OPERATIVE REPORT   PREOPERATIVE DIAGNOSES:  1. Left knee medial meniscus tear.  2. Left great toenail onychomycosis.   POSTOPERATIVE DIAGNOSES:  1. Left knee medial meniscus tear.  2. Chondromalacia of medial femoral condyle, left knee.  3. Left great toenail onychomycosis.  4. Chondromalacia, lateral tibial plateau.   PROCEDURES:  1. Removal of left great toenail.  2. Left knee diagnostic arthroscopy.  3. Partial medial meniscectomy.  4. Chondroplasty of the medial femoral condyle.  5. Chondroplasty of the lateral tibial plateau.   SURGEON OF RECORD:  Vania Rea. Supple, M.D.   ASSISTANTFrench Ana Shuford PA-C.   ANESTHESIA:  LMA general.   TOURNIQUET TIME:  None was used.   ESTIMATED BLOOD LOSS:  Minimal.   DRAINS:  None.   HISTORY:  Jane Mooney is a 60 year old female who has had chronic left  medial knee pain with examination showing discrete medial joint line  tenderness.  Due to her ongoing pain and functional limitations, she is  brought to the operating room at this time for planned left knee  arthroscopy as described below.   In addition, she has had a long history of difficulties with a left  great toenail onychomycosis with marked instability of the nail.  She is  requesting removal of nail at the time of surgery on the left knee.   I preoperatively counseled Ms. Herrod on treatment options as well as  risks versus benefits thereof.  The possible surgical complications of  bleeding, infection, neurovascular injury, DVT, PE, as well as  persistent pain, were reviewed.  She understands and accepts and agrees  with plan as outlined  above.   PROCEDURE IN DETAIL:  After undergoing routine preoperative evaluation,  the patient received prophylactic antibiotics.  Placed supine on the  operating table and underwent smooth induction of an LMA general  anesthesia.  Attention directed to the left foot, where the forefoot was  sterilely prepped in standard fashion.  Utilizing a Freer, the nail was  mobilized and it was noted to be grossly unstable.  It remained attached  at the proximal nail fold.  This was carefully dissected away with a  Glorious Peach and then a hemostat was used to remove the nail in its entirety.  The wound was then cleaned and Xeroform and a dry dressing was then  applied and wrapped about the great toe.  We did place approximately 5  mL of 0.5% plain Marcaine at the base of the toe at the  metatarsophalangeal joint level for postop analgesia.  The dressings  were then applied.   Attention was then directed to the left knee.  The left leg was placed  in a leg holder and sterilely prepped and draped in  standard fashion.  Standard arthroscopy portals were established and diagnostic arthroscopy  was performed.  The suprapatellar pouch and gutters were clear.  The  patellofemoral joint showed normal articular cartilage and normal  patellar tracking.  The ACL was intact with negative __________  Lachman.  Medially there was a complex tear involving the middle and  posterior thirds of the medial meniscus, and this was trimmed back to  stable peripheral margin with a basket and then the shears brought in  for final contouring and removal of meniscal fragments.  Probing of the  remaining peripheral rim of the medial meniscus showed it to be stable.  There was some diffuse grade 2 chondromalacia on the medial femoral  condyle, and was debrided to a stable base with a shaver.  Laterally  there was some grade 3 chondromalacia on the lateral tibial plateau, and  this was also debrided with  shaver to A stable  cartilaginous base.  The  articular surface on the lateral femoral condyle was in excellent  condition.  The lateral meniscus also intact and stable to probing. At  this point a final inspection and irrigation of the knee joint was  completed.  The fluid and instruments were removed.  A combination of  Marcaine, morphine, and epinephrine was instilled into the left knee  additional Marcaine with epinephrine instilled about the portals.  The  left knee was wrapped with a bulky dry dressing and Ace bandage, and the  patient was then transferred to the recovery room in stable condition.      Vania Rea. Supple, M.D.  Electronically Signed     KMS/MEDQ  D:  04/10/2007  T:  04/10/2007  Job:  [id number]

## 2011-05-02 ENCOUNTER — Encounter: Payer: Self-pay | Admitting: Internal Medicine

## 2011-05-03 ENCOUNTER — Ambulatory Visit (INDEPENDENT_AMBULATORY_CARE_PROVIDER_SITE_OTHER): Payer: 59 | Admitting: Internal Medicine

## 2011-05-03 ENCOUNTER — Encounter: Payer: Self-pay | Admitting: Internal Medicine

## 2011-05-03 ENCOUNTER — Other Ambulatory Visit (INDEPENDENT_AMBULATORY_CARE_PROVIDER_SITE_OTHER): Payer: 59

## 2011-05-03 ENCOUNTER — Telehealth: Payer: Self-pay | Admitting: Internal Medicine

## 2011-05-03 ENCOUNTER — Other Ambulatory Visit: Payer: Self-pay | Admitting: Internal Medicine

## 2011-05-03 DIAGNOSIS — F329 Major depressive disorder, single episode, unspecified: Secondary | ICD-10-CM

## 2011-05-03 DIAGNOSIS — R739 Hyperglycemia, unspecified: Secondary | ICD-10-CM

## 2011-05-03 DIAGNOSIS — F3289 Other specified depressive episodes: Secondary | ICD-10-CM

## 2011-05-03 DIAGNOSIS — F411 Generalized anxiety disorder: Secondary | ICD-10-CM

## 2011-05-03 DIAGNOSIS — M25569 Pain in unspecified knee: Secondary | ICD-10-CM

## 2011-05-03 DIAGNOSIS — R202 Paresthesia of skin: Secondary | ICD-10-CM

## 2011-05-03 DIAGNOSIS — R7309 Other abnormal glucose: Secondary | ICD-10-CM

## 2011-05-03 DIAGNOSIS — R209 Unspecified disturbances of skin sensation: Secondary | ICD-10-CM

## 2011-05-03 DIAGNOSIS — I10 Essential (primary) hypertension: Secondary | ICD-10-CM

## 2011-05-03 DIAGNOSIS — K649 Unspecified hemorrhoids: Secondary | ICD-10-CM

## 2011-05-03 LAB — LDL CHOLESTEROL, DIRECT: Direct LDL: 179.7 mg/dL

## 2011-05-03 LAB — LIPID PANEL
Cholesterol: 284 mg/dL — ABNORMAL HIGH (ref 0–200)
Total CHOL/HDL Ratio: 5
Triglycerides: 143 mg/dL (ref 0.0–149.0)
VLDL: 28.6 mg/dL (ref 0.0–40.0)

## 2011-05-03 LAB — CBC WITH DIFFERENTIAL/PLATELET
Basophils Relative: 0.7 % (ref 0.0–3.0)
Eosinophils Relative: 3.7 % (ref 0.0–5.0)
Hemoglobin: 12.1 g/dL (ref 12.0–15.0)
Lymphocytes Relative: 33 % (ref 12.0–46.0)
Neutro Abs: 3 10*3/uL (ref 1.4–7.7)
Neutrophils Relative %: 50.1 % (ref 43.0–77.0)
RBC: 4.45 Mil/uL (ref 3.87–5.11)
WBC: 6 10*3/uL (ref 4.5–10.5)

## 2011-05-03 LAB — TSH: TSH: 1.4 u[IU]/mL (ref 0.35–5.50)

## 2011-05-03 LAB — URINALYSIS
Ketones, ur: NEGATIVE
Specific Gravity, Urine: 1.025 (ref 1.000–1.030)
Total Protein, Urine: NEGATIVE
Urine Glucose: NEGATIVE
Urobilinogen, UA: 0.2 (ref 0.0–1.0)
pH: 5.5 (ref 5.0–8.0)

## 2011-05-03 LAB — COMPREHENSIVE METABOLIC PANEL
AST: 21 U/L (ref 0–37)
Albumin: 4.6 g/dL (ref 3.5–5.2)
Alkaline Phosphatase: 77 U/L (ref 39–117)
BUN: 18 mg/dL (ref 6–23)
Calcium: 9.5 mg/dL (ref 8.4–10.5)
Chloride: 99 mEq/L (ref 96–112)
Creatinine, Ser: 1 mg/dL (ref 0.4–1.2)
Glucose, Bld: 111 mg/dL — ABNORMAL HIGH (ref 70–99)
Potassium: 3.8 mEq/L (ref 3.5–5.1)

## 2011-05-03 MED ORDER — HYDROCORTISONE 2.5 % RE CREA: TOPICAL_CREAM | RECTAL | Status: DC

## 2011-05-03 MED ORDER — BUPROPION HCL ER (SR) 150 MG PO TB12: 150.0000 mg | ORAL_TABLET | Freq: Two times a day (BID) | ORAL | Status: DC

## 2011-05-03 NOTE — Assessment & Plan Note (Signed)
D/c Celexa Start Wellbutrin

## 2011-05-03 NOTE — Assessment & Plan Note (Signed)
Anusol HC. Surg ref was offered

## 2011-05-03 NOTE — Telephone Encounter (Signed)
Jane Mooney, please, inform patient that all labs are normal except for low Vit B12. OV tomorrow to start shots Thx

## 2011-05-03 NOTE — Assessment & Plan Note (Signed)
BP Readings from Last 3 Encounters:  05/03/11 138/70  03/02/11 120/90  12/11/10 130/80

## 2011-05-03 NOTE — Progress Notes (Signed)
  Subjective:    Patient ID: Jane Mooney, female    DOB: 1951-08-03, 60 y.o.   MRN: [id number]  HPI   The patient is here to follow up on chronic depression, anxiety, HTN, OA and chronic moderate fibromyalgia symptoms controlled with medicines, diet and exercise. C/o being sleepy in am after taking Celexa at hs.   Review of Systems  Constitutional: Positive for fatigue. Negative for chills, activity change, appetite change and unexpected weight change.  HENT: Negative for congestion, mouth sores and sinus pressure.   Eyes: Negative for visual disturbance.  Respiratory: Negative for cough and chest tightness.   Gastrointestinal: Negative for nausea and abdominal pain.  Genitourinary: Negative for frequency, difficulty urinating and vaginal pain.  Musculoskeletal: Negative for back pain and gait problem.  Skin: Negative for pallor and rash.  Neurological: Negative for dizziness, tremors, weakness, numbness and headaches.  Psychiatric/Behavioral: Negative for suicidal ideas, confusion and sleep disturbance. The patient is nervous/anxious.    Wt Readings from Last 3 Encounters:  05/03/11 183 lb (83.008 kg)  03/02/11 180 lb (81.647 kg)  12/11/10 180 lb (81.647 kg)       Objective:   Physical Exam  Constitutional: She appears well-developed. No distress.       Obese  HENT:  Head: Normocephalic.  Right Ear: External ear normal.  Left Ear: External ear normal.  Nose: Nose normal.  Mouth/Throat: Oropharynx is clear and moist.  Eyes: Conjunctivae are normal. Pupils are equal, round, and reactive to light. Right eye exhibits no discharge. Left eye exhibits no discharge.  Neck: Normal range of motion. Neck supple. No JVD present. No tracheal deviation present. No thyromegaly present.  Cardiovascular: Normal rate, regular rhythm and normal heart sounds.   Pulmonary/Chest: No stridor. No respiratory distress. She has no wheezes.  Abdominal: Soft. Bowel sounds are normal. She exhibits no  distension and no mass. There is no tenderness. There is no rebound and no guarding.  Musculoskeletal: She exhibits no edema and no tenderness.  Lymphadenopathy:    She has no cervical adenopathy.  Neurological: She displays normal reflexes. No cranial nerve deficit. She exhibits normal muscle tone. Coordination normal.  Skin: No rash noted. No erythema.  Psychiatric: She has a normal mood and affect. Her behavior is normal. Judgment and thought content normal.   L arm growth - irritated       Assessment & Plan:

## 2011-05-03 NOTE — Assessment & Plan Note (Signed)
On Rx 

## 2011-05-04 NOTE — Telephone Encounter (Signed)
Pt informed/transferred to scheduler.  

## 2011-05-07 ENCOUNTER — Encounter: Payer: Self-pay | Admitting: Internal Medicine

## 2011-05-07 ENCOUNTER — Ambulatory Visit (INDEPENDENT_AMBULATORY_CARE_PROVIDER_SITE_OTHER): Payer: 59 | Admitting: Internal Medicine

## 2011-05-07 DIAGNOSIS — E785 Hyperlipidemia, unspecified: Secondary | ICD-10-CM

## 2011-05-07 DIAGNOSIS — I10 Essential (primary) hypertension: Secondary | ICD-10-CM

## 2011-05-07 DIAGNOSIS — E538 Deficiency of other specified B group vitamins: Secondary | ICD-10-CM | POA: Insufficient documentation

## 2011-05-07 MED ORDER — CYANOCOBALAMIN 1000 MCG/ML IJ SOLN
1000.0000 ug | Freq: Once | INTRAMUSCULAR | Status: AC
  Administered 2011-05-07: 1000 ug via INTRAMUSCULAR

## 2011-05-07 MED ORDER — CYANOCOBALAMIN 1000 MCG/ML IJ SOLN: INTRAMUSCULAR | Status: DC

## 2011-05-07 MED ORDER — "SYRINGE/NEEDLE (DISP) 30G X 1/2"" 1 ML MISC": 1.0000 | Status: DC

## 2011-05-07 NOTE — Patient Instructions (Signed)
[email or web address]

## 2011-05-07 NOTE — Progress Notes (Signed)
  Subjective:    Patient ID: Jane Mooney, female    DOB: 1951-07-07, 60 y.o.   MRN: [id number]  HPI  F/u abn labs - low vit B12, elev glu and elev chol  Review of Systems  Constitutional: Positive for fatigue. Negative for chills, activity change, appetite change and unexpected weight change.  HENT: Negative for congestion, mouth sores and sinus pressure.   Eyes: Negative for visual disturbance.  Respiratory: Negative for cough and chest tightness.   Gastrointestinal: Negative for nausea and abdominal pain.  Genitourinary: Negative for frequency, difficulty urinating and vaginal pain.  Musculoskeletal: Negative for back pain and gait problem.  Skin: Negative for pallor and rash.  Neurological: Negative for dizziness, tremors, weakness, numbness and headaches.  Psychiatric/Behavioral: Negative for confusion and sleep disturbance.       Objective:   Physical Exam  Constitutional: She appears well-developed and well-nourished. No distress.  HENT:  Head: Normocephalic.  Right Ear: External ear normal.  Left Ear: External ear normal.  Nose: Nose normal.  Mouth/Throat: Oropharynx is clear and moist.  Eyes: Conjunctivae are normal. Pupils are equal, round, and reactive to light. Right eye exhibits no discharge. Left eye exhibits no discharge.  Neck: Normal range of motion. Neck supple. No JVD present. No tracheal deviation present. No thyromegaly present.  Cardiovascular: Normal rate, regular rhythm and normal heart sounds.   Pulmonary/Chest: No stridor. No respiratory distress. She has no wheezes.  Abdominal: Soft. Bowel sounds are normal. She exhibits no distension and no mass. There is no tenderness. There is no rebound and no guarding.  Musculoskeletal: She exhibits no edema and no tenderness.  Lymphadenopathy:    She has no cervical adenopathy.  Neurological: She displays normal reflexes. No cranial nerve deficit. She exhibits normal muscle tone. Coordination normal.  Skin: No  rash noted. No erythema.  Psychiatric: She has a normal mood and affect. Her behavior is normal. Judgment and thought content normal.        Lab Results  Component Value Date   WBC 6.0 05/03/2011   HGB 12.1 05/03/2011   HCT 36.1 05/03/2011   PLT 225.0 05/03/2011   CHOL 284* 05/03/2011   TRIG 143.0 05/03/2011   HDL 53.30 05/03/2011   LDLDIRECT 179.7 05/03/2011   ALT 15 05/03/2011   AST 21 05/03/2011   NA 139 05/03/2011   K 3.8 05/03/2011   CL 99 05/03/2011   CREATININE 1.0 05/03/2011   BUN 18 05/03/2011   CO2 31 05/03/2011   TSH 1.40 05/03/2011   HGBA1C 6.2 05/03/2011     Assessment & Plan:

## 2011-05-07 NOTE — Assessment & Plan Note (Signed)
Start B12 shots. 

## 2011-05-07 NOTE — Assessment & Plan Note (Signed)
Loose wt Good diet Recheck labs

## 2011-05-07 NOTE — Assessment & Plan Note (Signed)
On Rx 

## 2011-06-20 ENCOUNTER — Ambulatory Visit (INDEPENDENT_AMBULATORY_CARE_PROVIDER_SITE_OTHER): Payer: 59 | Admitting: Internal Medicine

## 2011-06-20 ENCOUNTER — Encounter: Payer: Self-pay | Admitting: Internal Medicine

## 2011-06-20 VITALS — BP 140/70 | HR 68 | Temp 98.8°F | Resp 16 | Wt 184.0 lb

## 2011-06-20 DIAGNOSIS — J069 Acute upper respiratory infection, unspecified: Secondary | ICD-10-CM | POA: Insufficient documentation

## 2011-06-20 MED ORDER — PROMETHAZINE-CODEINE 6.25-10 MG/5ML PO SYRP: 5.0000 mL | ORAL_SOLUTION | ORAL | Status: AC | PRN

## 2011-06-20 MED ORDER — AZITHROMYCIN 250 MG PO TABS: 250.0000 mg | ORAL_TABLET | Freq: Every day | ORAL | Status: AC

## 2011-06-20 NOTE — Assessment & Plan Note (Signed)
Zpac Prom-cod 

## 2011-06-20 NOTE — Progress Notes (Signed)
  Subjective:    Patient ID: Jane Mooney, female    DOB: 1951/02/12, 60 y.o.   MRN: [id number]  HPI  HPI  C/o URI sx's x   11 days. C/o ST, cough, weakness. Not better with OTC medicines. Actually, the patient is getting worse. The patient did not sleep last night due to cough.  Review of Systems  Constitutional: Positive for fever, chills and fatigue.  HENT: Positive for congestion, rhinorrhea, sneezing and postnasal drip.   Eyes: Positive for photophobia and pain. Negative for discharge and visual disturbance.  Respiratory: Positive for cough and no wheezing.   Positive for chest pain.  Gastrointestinal: Negative for vomiting, abdominal pain, diarrhea and abdominal distention.  Genitourinary: Negative for dysuria and difficulty urinating.  Skin: Negative for rash.  Neurological: Positive for dizziness, weakness and light-headedness.       Review of Systems     Objective:   Physical Exam  Constitutional: She appears well-developed and well-nourished. No distress.       Looks tired NAD  HENT:  Head: Normocephalic.  Right Ear: External ear normal.  Left Ear: External ear normal.  Nose: Nose normal.  Mouth/Throat: Oropharynx is clear and moist.       eryth throat  Eyes: Conjunctivae are normal. Pupils are equal, round, and reactive to light. Right eye exhibits no discharge. Left eye exhibits no discharge.  Neck: Normal range of motion. Neck supple. No JVD present. No tracheal deviation present. No thyromegaly present.  Cardiovascular: Normal rate, regular rhythm and normal heart sounds.   Pulmonary/Chest: No stridor. No respiratory distress. She has no wheezes. She has no rales. She exhibits no tenderness.       coughing  Abdominal: Soft. Bowel sounds are normal. She exhibits no distension. There is no tenderness.  Musculoskeletal: She exhibits no tenderness.  Lymphadenopathy:    She has no cervical adenopathy.  Skin: No rash noted. No erythema.  Psychiatric: She has a  normal mood and affect. Her behavior is normal. Judgment and thought content normal.          Assessment & Plan:

## 2011-06-20 NOTE — Patient Instructions (Signed)
Use over-the-counter  "cold" medicines  such as "Tylenol cold" , "Advil cold",  "Mucinex" or" Mucinex D"  for cough and congestion.   Avoid decongestants if you have high blood pressure and use "Afrin" nasal spray for nasal congestion as directed instead. Use" Delsym" or" Robitussin" cough syrup varietis for cough.  You can use plain "Tylenol" or "Advil" for fever, chills and achyness.  

## 2011-07-08 ENCOUNTER — Other Ambulatory Visit: Payer: Self-pay | Admitting: Internal Medicine

## 2011-07-27 ENCOUNTER — Other Ambulatory Visit: Payer: Self-pay | Admitting: Internal Medicine

## 2011-07-27 ENCOUNTER — Other Ambulatory Visit (INDEPENDENT_AMBULATORY_CARE_PROVIDER_SITE_OTHER): Payer: 59

## 2011-07-27 DIAGNOSIS — I10 Essential (primary) hypertension: Secondary | ICD-10-CM

## 2011-07-27 DIAGNOSIS — E785 Hyperlipidemia, unspecified: Secondary | ICD-10-CM

## 2011-07-27 DIAGNOSIS — E538 Deficiency of other specified B group vitamins: Secondary | ICD-10-CM

## 2011-07-27 LAB — VITAMIN B12: Vitamin B-12: 1125 pg/mL — ABNORMAL HIGH (ref 211–911)

## 2011-07-27 LAB — LIPID PANEL
Cholesterol: 223 mg/dL — ABNORMAL HIGH (ref 0–200)
HDL: 49.5 mg/dL (ref >39.00)
Triglycerides: 100 mg/dL (ref 0.0–149.0)
VLDL: 20 mg/dL (ref 0.0–40.0)

## 2011-07-27 LAB — COMPREHENSIVE METABOLIC PANEL
AST: 20 U/L (ref 0–37)
Alkaline Phosphatase: 76 U/L (ref 39–117)
BUN: 15 mg/dL (ref 6–23)
Creatinine, Ser: 1 mg/dL (ref 0.4–1.2)
Glucose, Bld: 120 mg/dL — ABNORMAL HIGH (ref 70–99)

## 2011-07-27 LAB — LDL CHOLESTEROL, DIRECT: Direct LDL: 155.9 mg/dL

## 2011-08-03 ENCOUNTER — Encounter: Payer: Self-pay | Admitting: Internal Medicine

## 2011-08-03 ENCOUNTER — Ambulatory Visit (INDEPENDENT_AMBULATORY_CARE_PROVIDER_SITE_OTHER): Payer: 59 | Admitting: Internal Medicine

## 2011-08-03 VITALS — BP 120/80 | HR 76 | Temp 98.6°F | Resp 16 | Wt 185.0 lb

## 2011-08-03 DIAGNOSIS — M25569 Pain in unspecified knee: Secondary | ICD-10-CM

## 2011-08-03 DIAGNOSIS — R7309 Other abnormal glucose: Secondary | ICD-10-CM

## 2011-08-03 DIAGNOSIS — I10 Essential (primary) hypertension: Secondary | ICD-10-CM

## 2011-08-03 DIAGNOSIS — B351 Tinea unguium: Secondary | ICD-10-CM | POA: Insufficient documentation

## 2011-08-03 DIAGNOSIS — Z23 Encounter for immunization: Secondary | ICD-10-CM

## 2011-08-03 DIAGNOSIS — E538 Deficiency of other specified B group vitamins: Secondary | ICD-10-CM

## 2011-08-03 DIAGNOSIS — D485 Neoplasm of uncertain behavior of skin: Secondary | ICD-10-CM

## 2011-08-03 MED ORDER — CICLOPIROX 8 % EX SOLN: Freq: Every day | CUTANEOUS | Status: AC

## 2011-08-03 MED ORDER — IBUPROFEN 600 MG PO TABS: 600.0000 mg | ORAL_TABLET | Freq: Two times a day (BID) | ORAL | Status: DC | PRN

## 2011-08-03 MED ORDER — HYDROCODONE-ACETAMINOPHEN 5-500 MG PO TABS: 1.0000 | ORAL_TABLET | Freq: Two times a day (BID) | ORAL | Status: DC | PRN

## 2011-08-03 MED ORDER — VITAMIN B-12 1000 MCG SL SUBL: 1.0000 | SUBLINGUAL_TABLET | Freq: Every day | SUBLINGUAL | Status: DC

## 2011-08-03 NOTE — Progress Notes (Signed)
Subjective:    Patient ID: Jane Mooney, female    DOB: 1951-09-05, 60 y.o.   MRN: [id number]  HPI The patient presents for a follow-up of  chronic hypertension, chronic dyslipidemia, elev glu, B12 def C/o irritated skin lesion   Review of Systems  Constitutional: Negative for chills, activity change, appetite change, fatigue and unexpected weight change.  HENT: Negative for congestion, mouth sores and sinus pressure.   Eyes: Negative for visual disturbance.  Respiratory: Negative for cough and chest tightness.   Gastrointestinal: Negative for nausea and abdominal pain.  Genitourinary: Negative for frequency, difficulty urinating and vaginal pain.  Musculoskeletal: Negative for back pain and gait problem.  Skin: Negative for pallor and rash.  Neurological: Negative for dizziness, tremors, weakness, numbness and headaches.  Psychiatric/Behavioral: Negative for confusion and sleep disturbance.       Objective:   Physical Exam  Constitutional: She appears well-developed and well-nourished. No distress.  HENT:  Head: Normocephalic.  Right Ear: External ear normal.  Left Ear: External ear normal.  Nose: Nose normal.  Mouth/Throat: Oropharynx is clear and moist.  Eyes: Conjunctivae are normal. Pupils are equal, round, and reactive to light. Right eye exhibits no discharge. Left eye exhibits no discharge.  Neck: Normal range of motion. Neck supple. No JVD present. No tracheal deviation present. No thyromegaly present.  Cardiovascular: Normal rate, regular rhythm and normal heart sounds.   Pulmonary/Chest: No stridor. No respiratory distress. She has no wheezes.  Abdominal: Soft. Bowel sounds are normal. She exhibits no distension and no mass. There is no tenderness. There is no rebound and no guarding.  Musculoskeletal: She exhibits no edema and no tenderness.  Lymphadenopathy:    She has no cervical adenopathy.  Neurological: She displays normal reflexes. No cranial nerve deficit.  She exhibits normal muscle tone. Coordination normal.  Skin: No rash noted. No erythema.       L inner arm irritated growth  Psychiatric: She has a normal mood and affect. Her behavior is normal. Judgment and thought content normal.   2 fingernails w/fungus  Lab Results  Component Value Date   WBC 6.0 05/03/2011   HGB 12.1 05/03/2011   HCT 36.1 05/03/2011   PLT 225.0 05/03/2011   CHOL 223* 07/27/2011   TRIG 100.0 07/27/2011   HDL 49.50 07/27/2011   LDLDIRECT 155.9 07/27/2011   ALT 12 07/27/2011   AST 20 07/27/2011   NA 140 07/27/2011   K 3.9 07/27/2011   CL 104 07/27/2011   CREATININE 1.0 07/27/2011   BUN 15 07/27/2011   CO2 28 07/27/2011   TSH 1.40 05/03/2011   HGBA1C 6.2 05/03/2011     Procedure Note :     Procedure :  Skin biopsy L arm lesion   Indication:  Irritated lesion(s)   Risks including unsuccessful procedure , bleeding, infection, bruising, scar, a need for another complete procedure and others were explained to the patient in detail as well as the benefits. Informed consent was obtained and signed.   The patient was placed in a decubitus position.  Lesion #1 on     measuring   4  mm   Skin over lesion #1  was prepped with Betadine and alcohol  and anesthetized with 1/2 cc of 2% lidocaine and epinephrine, using a 25-gauge 1 inch needle.  Hyfrecator was used to destroy the rest of the lesion potentially left behind and for hemostasis. Band-Aid was applied with antibiotic ointment. Discarded the specimen.     Tolerated well. Complications  none.      Postprocedure instructions :    A Band-Aid should be  changed twice daily. You can take a shower tomorrow.  Keep the wounds clean. You can wash them with liquid soap and water. Pat dry with gauze or a Kleenex tissue  Before applying antibiotic ointment and a Band-Aid.   You need to report immediately  if fever, chills or any signs of infection develop.    The biopsy results should be available in 1 -2 weeks.         Assessment & Plan:

## 2011-08-03 NOTE — Assessment & Plan Note (Signed)
Continue with current prescription therapy as reflected on the Med list.  

## 2011-08-03 NOTE — Assessment & Plan Note (Signed)
Worse Continue with current prescription therapy as reflected on the Med list.  

## 2011-08-03 NOTE — Assessment & Plan Note (Signed)
Try [facility]

## 2011-08-03 NOTE — Assessment & Plan Note (Signed)
Check A1c Wt Readings from Last 3 Encounters:  08/03/11 185 lb (83.915 kg)  06/20/11 184 lb (83.462 kg)  05/07/11 181 lb (82.101 kg)

## 2011-08-03 NOTE — Assessment & Plan Note (Signed)
She wants to try a SL form Will switch Labs in 3 mo

## 2011-08-03 NOTE — Assessment & Plan Note (Signed)
Will remove 

## 2011-08-06 ENCOUNTER — Other Ambulatory Visit (HOSPITAL_COMMUNITY): Payer: Self-pay | Admitting: Gynecology

## 2011-08-06 DIAGNOSIS — Z1231 Encounter for screening mammogram for malignant neoplasm of breast: Secondary | ICD-10-CM

## 2011-08-22 ENCOUNTER — Telehealth: Payer: Self-pay | Admitting: *Deleted

## 2011-08-22 MED ORDER — LORAZEPAM 1 MG PO TABS: 1.0000 mg | ORAL_TABLET | Freq: Two times a day (BID) | ORAL | Status: DC | PRN

## 2011-08-22 NOTE — Telephone Encounter (Signed)
OK to fill this prescription with additional refills x 6 mo Thank you!  

## 2011-08-22 NOTE — Telephone Encounter (Signed)
Patient requesting RF of ATIVAN to go to [facility].

## 2011-08-22 NOTE — Telephone Encounter (Signed)
Pending signature, then needs to be faxed to [facility]

## 2011-08-24 NOTE — Telephone Encounter (Signed)
Rx faxed to [facility]. Left detailed mess informing pt.

## 2011-09-10 ENCOUNTER — Ambulatory Visit (HOSPITAL_COMMUNITY)
Admission: RE | Admit: 2011-09-10 | Discharge: 2011-09-10 | Disposition: A | Discharge status: Home / Self Care | Payer: 59 | Source: Ambulatory Visit | Attending: Gynecology | Admitting: Gynecology

## 2011-09-10 DIAGNOSIS — Z1231 Encounter for screening mammogram for malignant neoplasm of breast: Secondary | ICD-10-CM

## 2011-10-31 ENCOUNTER — Other Ambulatory Visit (INDEPENDENT_AMBULATORY_CARE_PROVIDER_SITE_OTHER): Payer: 59

## 2011-10-31 DIAGNOSIS — E538 Deficiency of other specified B group vitamins: Secondary | ICD-10-CM

## 2011-10-31 DIAGNOSIS — R7309 Other abnormal glucose: Secondary | ICD-10-CM

## 2011-10-31 LAB — COMPREHENSIVE METABOLIC PANEL
ALT: 13 U/L (ref 0–35)
AST: 19 U/L (ref 0–37)
Albumin: 4.3 g/dL (ref 3.5–5.2)
CO2: 30 mEq/L (ref 19–32)
Calcium: 9.4 mg/dL (ref 8.4–10.5)
Chloride: 107 mEq/L (ref 96–112)
GFR: 83.65 mL/min (ref >60.00)
Potassium: 4.3 mEq/L (ref 3.5–5.1)

## 2011-10-31 LAB — VITAMIN B12: Vitamin B-12: 787 pg/mL (ref 211–911)

## 2011-10-31 LAB — HEMOGLOBIN A1C: Hgb A1c MFr Bld: 5.9 % (ref 4.6–6.5)

## 2011-11-07 ENCOUNTER — Encounter: Payer: Self-pay | Admitting: Internal Medicine

## 2011-11-07 ENCOUNTER — Ambulatory Visit (INDEPENDENT_AMBULATORY_CARE_PROVIDER_SITE_OTHER): Payer: 59 | Admitting: Internal Medicine

## 2011-11-07 DIAGNOSIS — F329 Major depressive disorder, single episode, unspecified: Secondary | ICD-10-CM

## 2011-11-07 DIAGNOSIS — F3289 Other specified depressive episodes: Secondary | ICD-10-CM

## 2011-11-07 DIAGNOSIS — R7309 Other abnormal glucose: Secondary | ICD-10-CM

## 2011-11-07 DIAGNOSIS — E538 Deficiency of other specified B group vitamins: Secondary | ICD-10-CM

## 2011-11-07 DIAGNOSIS — M545 Low back pain, unspecified: Secondary | ICD-10-CM

## 2011-11-07 DIAGNOSIS — F411 Generalized anxiety disorder: Secondary | ICD-10-CM

## 2011-11-07 DIAGNOSIS — I10 Essential (primary) hypertension: Secondary | ICD-10-CM

## 2011-11-07 MED ORDER — PROMETHAZINE HCL 25 MG PO TABS: 25.0000 mg | ORAL_TABLET | Freq: Four times a day (QID) | ORAL | Status: DC | PRN

## 2011-11-07 NOTE — Assessment & Plan Note (Signed)
Continue with current prescription therapy as reflected on the Med list.  

## 2011-11-07 NOTE — Assessment & Plan Note (Signed)
Better A1c and glu Wt Readings from Last 3 Encounters:  11/07/11 187 lb (84.823 kg)  08/03/11 185 lb (83.915 kg)  06/20/11 184 lb (83.462 kg)

## 2011-11-07 NOTE — Progress Notes (Signed)
  Subjective:    Patient ID: Jane Mooney, female    DOB: Nov 08, 1951, 60 y.o.   MRN: [id number]  HPI   The patient is here to follow up on elev glu, chronic depression, anxiety, headaches and chronic moderate OA/LBP symptoms controlled with medicines, diet and exercise.   Review of Systems  Constitutional: Negative for chills, activity change, appetite change, fatigue and unexpected weight change.  HENT: Negative for congestion, mouth sores and sinus pressure.   Eyes: Negative for visual disturbance.  Respiratory: Negative for cough and chest tightness.   Gastrointestinal: Negative for nausea and abdominal pain.  Genitourinary: Negative for frequency, difficulty urinating and vaginal pain.  Musculoskeletal: Negative for back pain and gait problem.  Skin: Negative for pallor and rash.  Neurological: Negative for dizziness, tremors, weakness, numbness and headaches.  Psychiatric/Behavioral: Negative for confusion and sleep disturbance. The patient is not nervous/anxious.        Objective:   Physical Exam  Constitutional: She appears well-developed. No distress.       obese  HENT:  Head: Normocephalic.  Right Ear: External ear normal.  Left Ear: External ear normal.  Nose: Nose normal.  Mouth/Throat: Oropharynx is clear and moist.  Eyes: Conjunctivae are normal. Pupils are equal, round, and reactive to light. Right eye exhibits no discharge. Left eye exhibits no discharge.  Neck: Normal range of motion. Neck supple. No JVD present. No tracheal deviation present. No thyromegaly present.  Cardiovascular: Normal rate, regular rhythm and normal heart sounds.   Pulmonary/Chest: No stridor. No respiratory distress. She has no wheezes.  Abdominal: Soft. Bowel sounds are normal. She exhibits no distension and no mass. There is no tenderness. There is no rebound and no guarding.  Musculoskeletal: She exhibits tenderness (LS is tender w/ROM). She exhibits no edema.  Lymphadenopathy:    She  has no cervical adenopathy.  Neurological: She displays normal reflexes. No cranial nerve deficit. She exhibits normal muscle tone. Coordination normal.  Skin: No rash noted. No erythema.  Psychiatric: She has a normal mood and affect. Her behavior is normal. Judgment and thought content normal.      Lab Results  Component Value Date   WBC 6.0 05/03/2011   HGB 12.1 05/03/2011   HCT 36.1 05/03/2011   PLT 225.0 05/03/2011   GLUCOSE 107* 10/31/2011   CHOL 223* 07/27/2011   TRIG 100.0 07/27/2011   HDL 49.50 07/27/2011   LDLDIRECT 155.9 07/27/2011   ALT 13 10/31/2011   AST 19 10/31/2011   NA 143 10/31/2011   K 4.3 10/31/2011   CL 107 10/31/2011   CREATININE 0.9 10/31/2011   BUN 13 10/31/2011   CO2 30 10/31/2011   TSH 1.40 05/03/2011   HGBA1C 5.9 10/31/2011       Assessment & Plan:

## 2011-12-13 ENCOUNTER — Other Ambulatory Visit: Payer: Self-pay | Admitting: Internal Medicine

## 2011-12-28 ENCOUNTER — Encounter: Payer: Self-pay | Admitting: Internal Medicine

## 2011-12-28 ENCOUNTER — Ambulatory Visit (INDEPENDENT_AMBULATORY_CARE_PROVIDER_SITE_OTHER)
Admission: RE | Admit: 2011-12-28 | Discharge: 2011-12-28 | Disposition: A | Discharge status: Home / Self Care | Payer: 59 | Source: Ambulatory Visit | Attending: Internal Medicine | Admitting: Internal Medicine

## 2011-12-28 ENCOUNTER — Telehealth: Payer: Self-pay | Admitting: Internal Medicine

## 2011-12-28 ENCOUNTER — Ambulatory Visit (INDEPENDENT_AMBULATORY_CARE_PROVIDER_SITE_OTHER): Payer: 59 | Admitting: Internal Medicine

## 2011-12-28 VITALS — BP 120/80 | HR 80 | Temp 99.6°F | Resp 16 | Wt 187.0 lb

## 2011-12-28 DIAGNOSIS — M545 Low back pain, unspecified: Secondary | ICD-10-CM

## 2011-12-28 DIAGNOSIS — M542 Cervicalgia: Secondary | ICD-10-CM

## 2011-12-28 DIAGNOSIS — I10 Essential (primary) hypertension: Secondary | ICD-10-CM

## 2011-12-28 DIAGNOSIS — E538 Deficiency of other specified B group vitamins: Secondary | ICD-10-CM

## 2011-12-28 MED ORDER — CYCLOBENZAPRINE HCL 5 MG PO TABS: 5.0000 mg | ORAL_TABLET | Freq: Two times a day (BID) | ORAL | Status: AC | PRN

## 2011-12-28 MED ORDER — PREDNISONE 10 MG PO TABS: ORAL_TABLET | ORAL | Status: DC

## 2011-12-28 NOTE — Telephone Encounter (Signed)
Jane Mooney, please, inform patient that her c spine xray showed OA Thx

## 2011-12-28 NOTE — Progress Notes (Signed)
  Subjective:    Patient ID: Jane Mooney, female    DOB: 07-Oct-1951, 61 y.o.   MRN: [id number]  HPI  C/o bad pain in the neck and traps B - very severe now x 5 d  Review of Systems     Objective:   Physical Exam        Assessment & Plan:

## 2011-12-28 NOTE — Patient Instructions (Signed)
Stretch Heat Contour pillow

## 2011-12-30 NOTE — Assessment & Plan Note (Signed)
Continue with current prescription therapy as reflected on the Med list.  

## 2011-12-30 NOTE — Assessment & Plan Note (Addendum)
R>>L 2/13 Possible radiculopathy See meds Exercises given Xray c-spine

## 2011-12-31 ENCOUNTER — Other Ambulatory Visit: Payer: Self-pay | Admitting: Internal Medicine

## 2011-12-31 ENCOUNTER — Other Ambulatory Visit: Payer: Self-pay | Admitting: Family Medicine

## 2011-12-31 ENCOUNTER — Telehealth: Payer: Self-pay | Admitting: Internal Medicine

## 2011-12-31 NOTE — Telephone Encounter (Signed)
[facility] Triage Call Report Triage Record Num: [id number] Operator: Lodema Pilot Patient Name: Jane Mooney Call Date & Time: 12/28/2011 6:31:53PM Patient Phone: [phone] PCP: Sonda Primes Patient Gender: Female PCP Fax : [phone] Patient DOB: [birth year] Practice Name: [facility] Reason for Call: Caller: [name]/Patient; PCP: Sonda Primes; CB#: [phone]; Call Reason: RX Flexeril was not at the Pharmacy; Sx Onset: 12/23/11 ; Sx Notes: Was seen 12/28/11. Back and neck pain radiating to right shoulder. (+) pinched nerver. RX: Prednisone (paper script was given), Flexeril (e-script) Pt had went to p/u RX, but Pharmacy had not received it.; Afebrile; Wt: ; Guideline Used: Back Sympoms; Disp:used standing order of Flexeril 5 mg PO TID PRN pain or muscular discomforts. Called to [facility] on [address] at [phone], and spoke with [place], Rph; Appt Scheduled?: No. Advised to call office on Monday 12/31/11. Care advice given. Protocol(s) Used: Back Symptoms Recommended Outcome per Protocol: See Provider within 72 Hours Reason for Outcome: Mild to moderate pain in back with normal activity or rest AND not responding to 72 hours of home care Care Advice: ~ Call provider if symptoms worsen or new symptoms develop. ~ Avoid heavy lifting, bending and twisting of the back, and prolonged sitting until evaluated by provider. ~ Avoid activity that causes or worsens symptoms. ~ SYMPTOM / CONDITION MANAGEMENT ~ CAUTIONS Analgesic/Antipyretic Advice - NSAIDs: Consider aspirin, ibuprofen, naproxen or ketoprofen for pain or fever as directed on label or by pharmacist/provider. PRECAUTIONS: - If over 60 years of age, should not take longer than 1 week without consulting provider. EXCEPTIONS: - Should not be used if taking blood thinners or have bleeding problems. - Do not use if have history of sensitivity/allergy to any of these medications; or history of cardiovascular,  ulcer, kidney, liver disease or diabetes unless approved by provider. - Do not exceed recommended dose or frequency. ~ 12/28/2011 6:51:17PM Page 1 of 1 [email or web address]

## 2012-01-03 NOTE — Telephone Encounter (Signed)
Pt advised.

## 2012-02-11 ENCOUNTER — Telehealth: Payer: Self-pay

## 2012-02-11 DIAGNOSIS — L709 Acne, unspecified: Secondary | ICD-10-CM

## 2012-02-11 NOTE — Telephone Encounter (Signed)
Pt called stating she has developed a moderate acne breakout near her chin. Pt is requesting a referral to see Dermatology, please advise.

## 2012-02-12 NOTE — Telephone Encounter (Signed)
Ok derm ref Thx

## 2012-03-06 ENCOUNTER — Telehealth: Payer: Self-pay | Admitting: *Deleted

## 2012-03-06 ENCOUNTER — Encounter: Payer: Self-pay | Admitting: Internal Medicine

## 2012-03-06 ENCOUNTER — Ambulatory Visit (INDEPENDENT_AMBULATORY_CARE_PROVIDER_SITE_OTHER): Payer: 59 | Admitting: Internal Medicine

## 2012-03-06 VITALS — BP 104/70 | HR 80 | Temp 98.4°F | Resp 16 | Wt 188.0 lb

## 2012-03-06 DIAGNOSIS — E538 Deficiency of other specified B group vitamins: Secondary | ICD-10-CM

## 2012-03-06 DIAGNOSIS — I10 Essential (primary) hypertension: Secondary | ICD-10-CM

## 2012-03-06 DIAGNOSIS — M545 Low back pain: Secondary | ICD-10-CM

## 2012-03-06 DIAGNOSIS — F329 Major depressive disorder, single episode, unspecified: Secondary | ICD-10-CM

## 2012-03-06 DIAGNOSIS — E669 Obesity, unspecified: Secondary | ICD-10-CM | POA: Insufficient documentation

## 2012-03-06 NOTE — Assessment & Plan Note (Signed)
Continue with current prescription therapy as reflected on the Med list.  

## 2012-03-06 NOTE — Patient Instructions (Addendum)
Wt Readings from Last 3 Encounters:  03/06/12 188 lb (85.276 kg)  12/28/11 187 lb (84.823 kg)  11/07/11 187 lb (84.823 kg)    [email or web address]

## 2012-03-06 NOTE — Assessment & Plan Note (Signed)
Better  

## 2012-03-06 NOTE — Assessment & Plan Note (Signed)
Options discussed incl Lap band

## 2012-03-06 NOTE — Telephone Encounter (Signed)
Pt calling stating she forgot to ask for a refill on her Vicodin sent to [facility]. Please advise.

## 2012-03-06 NOTE — Progress Notes (Signed)
Patient ID: Jane Mooney, female   DOB: [birth year], 61 y.o.   MRN: [id number]  Subjective:    Patient ID: Jane Mooney, female    DOB: 12-27-1950, 61 y.o.   MRN: [id number]  HPI   The patient is here to follow up on elev glu, chronic depression, anxiety, headaches and chronic moderate OA/LBP symptoms controlled with medicines, diet and exercise.  Wt Readings from Last 3 Encounters:  03/06/12 188 lb (85.276 kg)  12/28/11 187 lb (84.823 kg)  11/07/11 187 lb (84.823 kg)   BP Readings from Last 3 Encounters:  03/06/12 104/70  12/28/11 120/80  11/07/11 120/80       Review of Systems  Constitutional: Negative for chills, activity change, appetite change, fatigue and unexpected weight change.  HENT: Negative for congestion, mouth sores and sinus pressure.   Eyes: Negative for visual disturbance.  Respiratory: Negative for cough and chest tightness.   Gastrointestinal: Negative for nausea and abdominal pain.  Genitourinary: Negative for frequency, difficulty urinating and vaginal pain.  Musculoskeletal: Negative for back pain and gait problem.  Skin: Negative for pallor and rash.  Neurological: Negative for dizziness, tremors, weakness, numbness and headaches.  Psychiatric/Behavioral: Negative for confusion and sleep disturbance. The patient is not nervous/anxious.        Objective:   Physical Exam  Constitutional: She appears well-developed. No distress.       obese  HENT:  Head: Normocephalic.  Right Ear: External ear normal.  Left Ear: External ear normal.  Nose: Nose normal.  Mouth/Throat: Oropharynx is clear and moist.  Eyes: Conjunctivae are normal. Pupils are equal, round, and reactive to light. Right eye exhibits no discharge. Left eye exhibits no discharge.  Neck: Normal range of motion. Neck supple. No JVD present. No tracheal deviation present. No thyromegaly present.  Cardiovascular: Normal rate, regular rhythm and normal heart sounds.   Pulmonary/Chest: No  stridor. No respiratory distress. She has no wheezes.  Abdominal: Soft. Bowel sounds are normal. She exhibits no distension and no mass. There is no tenderness. There is no rebound and no guarding.  Musculoskeletal: She exhibits tenderness (LS is tender w/ROM). She exhibits no edema.  Lymphadenopathy:    She has no cervical adenopathy.  Neurological: She displays normal reflexes. No cranial nerve deficit. She exhibits normal muscle tone. Coordination normal.  Skin: No rash noted. No erythema.  Psychiatric: She has a normal mood and affect. Her behavior is normal. Judgment and thought content normal.      Lab Results  Component Value Date   WBC 6.0 05/03/2011   HGB 12.1 05/03/2011   HCT 36.1 05/03/2011   PLT 225.0 05/03/2011   GLUCOSE 107* 10/31/2011   CHOL 223* 07/27/2011   TRIG 100.0 07/27/2011   HDL 49.50 07/27/2011   LDLDIRECT 155.9 07/27/2011   ALT 13 10/31/2011   AST 19 10/31/2011   NA 143 10/31/2011   K 4.3 10/31/2011   CL 107 10/31/2011   CREATININE 0.9 10/31/2011   BUN 13 10/31/2011   CO2 30 10/31/2011   TSH 1.40 05/03/2011   HGBA1C 5.9 10/31/2011       Assessment & Plan:

## 2012-03-07 NOTE — Telephone Encounter (Signed)
OK to fill this prescription with additional refills x1 Thank you!  

## 2012-03-11 MED ORDER — HYDROCODONE-ACETAMINOPHEN 5-500 MG PO TABS: 1.0000 | ORAL_TABLET | Freq: Two times a day (BID) | ORAL | Status: DC | PRN

## 2012-03-11 NOTE — Telephone Encounter (Signed)
Per [facility]- Rx must be faxed to them. Rx printed/pending MD sig.

## 2012-03-12 NOTE — Telephone Encounter (Signed)
Rx was signed/faxed to [facility] on 03/11/12.

## 2012-03-22 ENCOUNTER — Encounter (HOSPITAL_COMMUNITY): Payer: Self-pay | Admitting: Emergency Medicine

## 2012-03-22 ENCOUNTER — Emergency Department (HOSPITAL_COMMUNITY)
Admission: EM | Admit: 2012-03-22 | Discharge: 2012-03-22 | Disposition: A | Discharge status: Home / Self Care | Payer: 59 | Attending: Emergency Medicine | Admitting: Emergency Medicine

## 2012-03-22 DIAGNOSIS — I059 Rheumatic mitral valve disease, unspecified: Secondary | ICD-10-CM | POA: Insufficient documentation

## 2012-03-22 DIAGNOSIS — IMO0001 Reserved for inherently not codable concepts without codable children: Secondary | ICD-10-CM | POA: Insufficient documentation

## 2012-03-22 DIAGNOSIS — I10 Essential (primary) hypertension: Secondary | ICD-10-CM | POA: Insufficient documentation

## 2012-03-22 DIAGNOSIS — Z79899 Other long term (current) drug therapy: Secondary | ICD-10-CM | POA: Insufficient documentation

## 2012-03-22 DIAGNOSIS — S50861A Insect bite (nonvenomous) of right forearm, initial encounter: Secondary | ICD-10-CM

## 2012-03-22 DIAGNOSIS — E7800 Pure hypercholesterolemia, unspecified: Secondary | ICD-10-CM | POA: Insufficient documentation

## 2012-03-22 MED ORDER — TRIAMCINOLONE ACETONIDE 0.5 % EX OINT: TOPICAL_OINTMENT | Freq: Two times a day (BID) | CUTANEOUS | Status: AC

## 2012-03-22 MED ORDER — FAMOTIDINE 20 MG PO TABS: 20.0000 mg | ORAL_TABLET | Freq: Two times a day (BID) | ORAL | Status: DC

## 2012-03-22 NOTE — ED Provider Notes (Signed)
History     CSN: [id number]  Arrival date & time 03/22/12  2007   First MD Initiated Contact with Patient 03/22/12 2131      Chief Complaint  Patient presents with  . Insect Bite    (Consider location/radiation/quality/duration/timing/severity/associated sxs/prior treatment) Patient is a 61 y.o. female presenting with rash. The history is provided by the patient.  Rash  This is a new problem. The current episode started yesterday. The problem has not changed since onset.There has been no fever. The rash is present on the right arm. The pain is at a severity of 1/10. The patient is experiencing no pain. The pain has been constant since onset. Associated symptoms include itching. She has tried antihistamines for the symptoms.  Pt states she was at a store and saw a "bug" on her right forearm. States she killed it. Since then swelling redness to the right forearm. States it is not painful, but very itchy. States took benadryl with no irmpvovement. Denies fever, chills, pain with wrist or elbow movement.   Past Medical History  Diagnosis Date  . Anxiety 2010  . Depression   . HTN (hypertension)   . DJD (degenerative joint disease)     Left knee  . MVP (mitral valve prolapse)   . Symptomatic PVCs   . Allergic rhinitis   . LBP (low back pain)   . Hyperlipidemia   . Hemorrhoid     Past Surgical History  Procedure Date  . Knee surgery 03/2007    Left Knee Arthr- Dr Rennis Chris  . Rotator cuff repair 2004    Left   . Abdominal hysterectomy 1984  . Tonsillectomy     Family History  Problem Relation Age of Onset  . Diabetes Other   . Hypertension Other   . Breast cancer Other   . Cancer Other     breast   . Hypertension Mother     History  Substance Use Topics  . Smoking status: Never Smoker   . Smokeless tobacco: Not on file  . Alcohol Use: No    OB History    Grav Para Term Preterm Abortions TAB SAB Ect Mult Living                  Review of Systems    Constitutional: Negative for fever and chills.  HENT: Negative for congestion and facial swelling.   Respiratory: Negative.   Cardiovascular: Negative.   Musculoskeletal: Negative.   Skin: Positive for itching and rash.  Neurological: Negative for weakness and numbness.    Allergies  Citalopram; Hydrochlorothiazide w-triamterene; Mirtazapine; and Quinapril hcl  Home Medications   Current Outpatient Rx  Name Route Sig Dispense Refill  . AZOR 10-40 MG PO TABS  TAKE 1 TABLET EVERY DAY 7 tablet 0  . VITAMIN D3 1000 UNITS PO TABS Oral Take 1,000 Units by mouth daily.      [name] VITAMIN B-12 1000 MCG SL SUBL Sublingual Place 1 tablet (1,000 mcg total) under the tongue daily. 100 tablet 3  . ESTRADIOL 0.1 MG/GM VA CREA Vaginal Place 1 g vaginally. Two times weekly.    [name] FERROUS SULFATE 325 (65 FE) MG PO TABS Oral Take 325 mg by mouth daily. For anemia     . OMEGA-3 FATTY ACIDS 1000 MG PO CAPS Oral Take 1 g by mouth daily.      [name] FLUTICASONE PROPIONATE 50 MCG/ACT NA SUSP Nasal Place 1 spray into the nose daily as needed. Allergies.    [name]  HYDROCODONE-ACETAMINOPHEN 5-500 MG PO TABS Oral Take 1 tablet by mouth 2 (two) times daily as needed. As needed for pain 90 tablet 1  . HYDROCORTISONE 2.5 % RE CREA  Apply rectally 2 times daily 90 g 3  . IBUPROFEN 600 MG PO TABS Oral Take 1 tablet (600 mg total) by mouth 2 (two) times daily as needed. For pain 60 tablet 3  . LORAZEPAM 1 MG PO TABS Oral Take 1 mg by mouth 2 (two) times daily as needed. Anxiety.    [name] POTASSIUM CHLORIDE CRYS ER 10 MEQ PO TBCR  TAKE 1 TABLET DAILY 90 tablet 1  . TRIAMTERENE-HCTZ 37.5-25 MG PO TABS Oral Take 1 tablet by mouth daily.        BP 131/72  Pulse 65  Temp(Src) 98.6 F (37 C) (Oral)  Resp 16  Ht 5\' 3"  (1.6 m)  Wt 187 lb (84.823 kg)  BMI 33.13 kg/m2  SpO2 100%  Physical Exam  Nursing note and vitals reviewed. Constitutional: She appears well-developed and well-nourished. No distress.  HENT:  Head: Normocephalic  and atraumatic.  Eyes: Conjunctivae are normal.  Neck: Neck supple.  Cardiovascular: Normal rate, regular rhythm and normal heart sounds.   Pulmonary/Chest: Effort normal and breath sounds normal. No respiratory distress.  Musculoskeletal:       Mild swelling, erythema to the right distal dorsal forearm with central pin point papule. No pain with wrist or elbow ROM. Area not tender.   Neurological: She is alert.  Skin: Skin is warm and dry. Rash noted. There is erythema.  Psychiatric: She has a normal mood and affect.    ED Course  Procedures (including critical care time)  I suspect pt's symptoms are due to a localized histamine reaction for an insect bite. She is afebrile, she has no pain, doubt cellulitis. Will treat with topical steroids, HI and H2 blockers and close follow up.    1. Insect bite of forearm with local reaction, right, initial encounter       MDM          Lottie Mussel, PA 03/23/12 0106

## 2012-03-22 NOTE — Discharge Instructions (Signed)
Continue benadryl. Apply kenalog cream topically as prescribed. You can also try topical benadryl for itching. Take pepcid to help as well. Follow up in two days for recheck. Return if increased rendness, pain, fever, or any new concerning symptom   Insect Sting Allergy An insect sting can cause pain, redness, and itching at the sting site. Symptoms of an allergic reaction are usually contained in the area of the sting site (localized). An allergic reaction usually occurs within minutes of an insect sting. Redness and swelling of the sting site may last as long as 1 week. SYMPTOMS   A local reaction at the sting site can cause:   Pain.   Redness.   Itching.   Swelling.   A systemic reaction can cause a reaction anywhere on your body. For example, you may develop the following:   Hives.   Generalized swelling.   Body aches.   Itching.   Dizziness.   Nausea or vomiting.   A more serious (anaphylactic) reaction can involve:   Difficulty breathing or wheezing.   Tongue or throat swelling.   Fainting.  HOME CARE INSTRUCTIONS   If you are stung, look to see if the stinger is still in the skin. This can appear as a small, black dot at the sting site. The stinger can be removed by scraping it with a dull object such as a credit card or your fingernail. Do not use tweezers. Tweezers can squeeze the stinger and release more insect venom into the skin.   After the stinger has been removed, wash the sting site with soap and water or rubbing alcohol.   Put ice on the sting area.   Put ice in a plastic bag.   Place a towel between your skin and the bag.   Leave the ice on for 15 to 20 minutes, 3 to 4 times a day.   You can use a topical anti-itch cream, such as hydrocortisone cream, to help reduce itching.   You can take an oral antihistamine medicine to help decrease swelling and other symptoms.   Only take over-the-counter or prescription medicines for pain, discomfort, or  fever as directed by your caregiver.   If prescribed, keep an epinephrine injection to temporarily treat emergency allergic reactions with you at all times. It is important to know how and when to give an epinephrine injection.   Avoid contact with stinging insects or the insect thought to have caused your reaction.   Wear long pants when mowing grass or hiking. Wear gloves when gardening.   Use unscented deodorant and avoid strong perfumes when outdoors.   Wear a medical alert bracelet or necklace that describes your allergies.   Make sure your primary caregiver has a record of your insect sting reaction.   It may be helpful to consult with an allergy specialist. You may have other sensitivities that you are not aware of.  SEEK IMMEDIATE MEDICAL CARE IF:  You experience wheezing or difficulty breathing.   You have difficulty swallowing, or you develop throat tightness.   You have mouth, tongue, or throat swelling.   You feel weak, or you faint.   You have coughing or a change in your voice.   You experience vomiting, diarrhea, or stomach cramps.   You have chest pain or lightheadedness.   You notice raised, red patches on the skin that itch.  These may be early warning signs of a serious generalized or anaphylactic reaction. Call your local emergency services (911 in  [place] immediately. MAKE SURE YOU:   Understand these instructions.   Will watch your condition.   Will get help right away if you are not doing well or get worse.  FOR MORE INFORMATION American Academy of Allergy Asthma and Immunology: www.aaaai.org [employer]: www.acaai.org Document Released: 09/27/2006 Document Revised: 10/18/2011 Document Reviewed: 11/08/2009 [facility] Patient Information 2012 [place], [place].

## 2012-03-22 NOTE — ED Notes (Signed)
Pt c/o itching/redness to R FA, pt states while shopping yesterday noted a bug on R arm, later in the day began itching. Redness noted.

## 2012-03-23 NOTE — ED Provider Notes (Signed)
Medical screening examination/treatment/procedure(s) were performed by non-physician practitioner and as supervising physician I was immediately available for consultation/collaboration. Devoria Albe, MD, [facility]   Ward Givens, MD 03/23/12 1038

## 2012-03-24 ENCOUNTER — Telehealth: Payer: Self-pay | Admitting: *Deleted

## 2012-03-24 DIAGNOSIS — Z0000 Encounter for general adult medical examination without abnormal findings: Secondary | ICD-10-CM

## 2012-03-24 NOTE — Telephone Encounter (Signed)
Message copied by Merrilyn Puma on Mon Mar 24, 2012 11:56 AM ------      Message from: Etheleen Sia      Created: Thu Mar 06, 2012  9:13 AM      Regarding: PHYSICAL LABS       PHYSICAL LABS  FOR AUG APPT

## 2012-03-24 NOTE — Telephone Encounter (Signed)
06/2012 CPE labs entered.   

## 2012-05-18 ENCOUNTER — Other Ambulatory Visit: Payer: Self-pay | Admitting: Internal Medicine

## 2012-05-19 ENCOUNTER — Other Ambulatory Visit: Payer: Self-pay | Admitting: General Practice

## 2012-05-19 MED ORDER — POTASSIUM CHLORIDE CRYS ER 10 MEQ PO TBCR: 10.0000 meq | EXTENDED_RELEASE_TABLET | Freq: Every day | ORAL | Status: DC

## 2012-05-23 ENCOUNTER — Encounter: Payer: Self-pay | Admitting: Internal Medicine

## 2012-06-24 ENCOUNTER — Other Ambulatory Visit (INDEPENDENT_AMBULATORY_CARE_PROVIDER_SITE_OTHER): Payer: 59

## 2012-06-24 DIAGNOSIS — Z0000 Encounter for general adult medical examination without abnormal findings: Secondary | ICD-10-CM

## 2012-06-24 LAB — CBC WITH DIFFERENTIAL/PLATELET
Basophils Absolute: 0 10*3/uL (ref 0.0–0.1)
Eosinophils Absolute: 0.2 10*3/uL (ref 0.0–0.7)
MCHC: 31.8 g/dL (ref 30.0–36.0)
MCV: 81.1 fl (ref 78.0–100.0)
Monocytes Absolute: 0.6 10*3/uL (ref 0.1–1.0)
Neutrophils Relative %: 45.9 % (ref 43.0–77.0)
Platelets: 216 10*3/uL (ref 150.0–400.0)
RDW: 13.9 % (ref 11.5–14.6)

## 2012-06-24 LAB — URINALYSIS, ROUTINE W REFLEX MICROSCOPIC
Ketones, ur: NEGATIVE
Leukocytes, UA: NEGATIVE
Specific Gravity, Urine: 1.025 (ref 1.000–1.030)
Urobilinogen, UA: 0.2 (ref 0.0–1.0)

## 2012-06-24 LAB — BASIC METABOLIC PANEL
BUN: 17 mg/dL (ref 6–23)
CO2: 27 mEq/L (ref 19–32)
Calcium: 9.4 mg/dL (ref 8.4–10.5)
Creatinine, Ser: 1 mg/dL (ref 0.4–1.2)
Glucose, Bld: 93 mg/dL (ref 70–99)
Sodium: 135 mEq/L (ref 135–145)

## 2012-06-24 LAB — LIPID PANEL
Cholesterol: 205 mg/dL — ABNORMAL HIGH (ref 0–200)
Triglycerides: 146 mg/dL (ref 0.0–149.0)

## 2012-06-24 LAB — HEPATIC FUNCTION PANEL
Albumin: 4.4 g/dL (ref 3.5–5.2)
Alkaline Phosphatase: 67 U/L (ref 39–117)
Total Protein: 7.5 g/dL (ref 6.0–8.3)

## 2012-07-02 ENCOUNTER — Encounter: Payer: Self-pay | Admitting: Internal Medicine

## 2012-07-02 ENCOUNTER — Ambulatory Visit (INDEPENDENT_AMBULATORY_CARE_PROVIDER_SITE_OTHER): Payer: 59 | Admitting: Internal Medicine

## 2012-07-02 VITALS — BP 120/76 | HR 76 | Temp 98.2°F | Resp 16 | Wt 182.5 lb

## 2012-07-02 DIAGNOSIS — Z0000 Encounter for general adult medical examination without abnormal findings: Secondary | ICD-10-CM | POA: Insufficient documentation

## 2012-07-02 DIAGNOSIS — Z23 Encounter for immunization: Secondary | ICD-10-CM

## 2012-07-02 MED ORDER — IBUPROFEN 600 MG PO TABS: 600.0000 mg | ORAL_TABLET | Freq: Two times a day (BID) | ORAL | Status: DC | PRN

## 2012-07-02 MED ORDER — LORAZEPAM 1 MG PO TABS: 1.0000 mg | ORAL_TABLET | Freq: Two times a day (BID) | ORAL | Status: DC | PRN

## 2012-07-02 NOTE — Progress Notes (Signed)
   Subjective:    Patient ID: Jane Mooney, female    DOB: Dec 03, 1950, 61 y.o.   MRN: [id number]  HPI  The patient is here for a wellness exam. The patient has been doing well overall without major physical or psychological issues going on lately  The patient is here to follow up on elev glu, chronic depression, anxiety, headaches and chronic moderate OA/LBP symptoms controlled with medicines, diet and exercise.  Wt Readings from Last 3 Encounters:  03/22/12 187 lb (84.823 kg)  03/06/12 188 lb (85.276 kg)  12/28/11 187 lb (84.823 kg)   BP Readings from Last 3 Encounters:  03/22/12 131/72  03/06/12 104/70  12/28/11 120/80       Review of Systems  Constitutional: Negative for chills, activity change, appetite change, fatigue and unexpected weight change.  HENT: Negative for congestion, mouth sores and sinus pressure.   Eyes: Negative for visual disturbance.  Respiratory: Negative for cough and chest tightness.   Gastrointestinal: Negative for nausea and abdominal pain.  Genitourinary: Negative for frequency, difficulty urinating and vaginal pain.  Musculoskeletal: Negative for back pain and gait problem.  Skin: Negative for pallor and rash.  Neurological: Negative for dizziness, tremors, weakness, numbness and headaches.  Psychiatric/Behavioral: Negative for confusion and disturbed wake/sleep cycle. The patient is not nervous/anxious.        Objective:   Physical Exam  Constitutional: She appears well-developed. No distress.       obese  HENT:  Head: Normocephalic.  Right Ear: External ear normal.  Left Ear: External ear normal.  Nose: Nose normal.  Mouth/Throat: Oropharynx is clear and moist.  Eyes: Conjunctivae are normal. Pupils are equal, round, and reactive to light. Right eye exhibits no discharge. Left eye exhibits no discharge.  Neck: Normal range of motion. Neck supple. No JVD present. No tracheal deviation present. No thyromegaly present.  Cardiovascular:  Normal rate, regular rhythm and normal heart sounds.   Pulmonary/Chest: No stridor. No respiratory distress. She has no wheezes.  Abdominal: Soft. Bowel sounds are normal. She exhibits no distension and no mass. There is no tenderness. There is no rebound and no guarding.  Musculoskeletal: She exhibits tenderness (LS is tender w/ROM). She exhibits no edema.  Lymphadenopathy:    She has no cervical adenopathy.  Neurological: She displays normal reflexes. No cranial nerve deficit. She exhibits normal muscle tone. Coordination normal.  Skin: No rash noted. No erythema.  Psychiatric: She has a normal mood and affect. Her behavior is normal. Judgment and thought content normal.      Lab Results  Component Value Date   WBC 5.2 06/24/2012   HGB 11.8* 06/24/2012   HCT 37.3 06/24/2012   PLT 216.0 06/24/2012   GLUCOSE 93 06/24/2012   CHOL 205* 06/24/2012   TRIG 146.0 06/24/2012   HDL 47.80 06/24/2012   LDLDIRECT 136.3 06/24/2012   ALT 15 06/24/2012   AST 22 06/24/2012   NA 135 06/24/2012   K 3.7 06/24/2012   CL 99 06/24/2012   CREATININE 1.0 06/24/2012   BUN 17 06/24/2012   CO2 27 06/24/2012   TSH 2.10 06/24/2012   HGBA1C 5.9 10/31/2011       Assessment & Plan:

## 2012-07-02 NOTE — Assessment & Plan Note (Addendum)
We discussed age appropriate health related issues, including available/recomended screening tests and vaccinations. We discussed a need for adhering to healthy diet and exercise. Labs/EKG were reviewed/ordered. All questions were answered. Colon is due - she will schedule.  tDAP Zostavax suggested

## 2012-08-14 ENCOUNTER — Other Ambulatory Visit (HOSPITAL_COMMUNITY): Payer: Self-pay | Admitting: Gynecology

## 2012-08-14 DIAGNOSIS — Z1231 Encounter for screening mammogram for malignant neoplasm of breast: Secondary | ICD-10-CM

## 2012-09-09 ENCOUNTER — Ambulatory Visit (INDEPENDENT_AMBULATORY_CARE_PROVIDER_SITE_OTHER): Payer: 59

## 2012-09-09 DIAGNOSIS — Z23 Encounter for immunization: Secondary | ICD-10-CM

## 2012-09-10 ENCOUNTER — Ambulatory Visit (HOSPITAL_COMMUNITY)
Admission: RE | Admit: 2012-09-10 | Discharge: 2012-09-10 | Disposition: A | Discharge status: Home / Self Care | Payer: 59 | Source: Ambulatory Visit | Attending: Gynecology | Admitting: Gynecology

## 2012-09-10 DIAGNOSIS — Z1231 Encounter for screening mammogram for malignant neoplasm of breast: Secondary | ICD-10-CM | POA: Insufficient documentation

## 2012-11-07 ENCOUNTER — Other Ambulatory Visit: Payer: Self-pay | Admitting: Internal Medicine

## 2012-11-14 ENCOUNTER — Other Ambulatory Visit: Payer: Self-pay | Admitting: Internal Medicine

## 2012-12-31 ENCOUNTER — Encounter: Payer: Self-pay | Admitting: Internal Medicine

## 2012-12-31 ENCOUNTER — Ambulatory Visit (INDEPENDENT_AMBULATORY_CARE_PROVIDER_SITE_OTHER): Payer: 59 | Admitting: Internal Medicine

## 2012-12-31 VITALS — BP 116/76 | HR 76 | Temp 98.0°F | Resp 16 | Wt 184.0 lb

## 2012-12-31 DIAGNOSIS — E559 Vitamin D deficiency, unspecified: Secondary | ICD-10-CM

## 2012-12-31 DIAGNOSIS — E785 Hyperlipidemia, unspecified: Secondary | ICD-10-CM

## 2012-12-31 DIAGNOSIS — F411 Generalized anxiety disorder: Secondary | ICD-10-CM

## 2012-12-31 DIAGNOSIS — E538 Deficiency of other specified B group vitamins: Secondary | ICD-10-CM

## 2012-12-31 DIAGNOSIS — F329 Major depressive disorder, single episode, unspecified: Secondary | ICD-10-CM

## 2012-12-31 DIAGNOSIS — M545 Low back pain: Secondary | ICD-10-CM

## 2012-12-31 NOTE — Assessment & Plan Note (Signed)
Continue with current prescription therapy as reflected on the Med list.  

## 2012-12-31 NOTE — Progress Notes (Signed)
   Subjective:    HPI    The patient is here to follow up on elev glu, chronic depression, anxiety, headaches and chronic moderate OA/LBP symptoms controlled with medicines, diet and exercise. C/o bunions   Wt Readings from Last 3 Encounters:  12/31/12 184 lb (83.462 kg)  07/02/12 182 lb 8 oz (82.781 kg)  03/22/12 187 lb (84.823 kg)   BP Readings from Last 3 Encounters:  12/31/12 116/76  07/02/12 120/76  03/22/12 131/72       Review of Systems  Constitutional: Negative for chills, activity change, appetite change, fatigue and unexpected weight change.  HENT: Negative for congestion, mouth sores and sinus pressure.   Eyes: Negative for visual disturbance.  Respiratory: Negative for cough and chest tightness.   Gastrointestinal: Negative for nausea and abdominal pain.  Genitourinary: Negative for frequency, difficulty urinating and vaginal pain.  Musculoskeletal: Negative for back pain and gait problem.  Skin: Negative for pallor and rash.  Neurological: Negative for dizziness, tremors, weakness, numbness and headaches.  Psychiatric/Behavioral: Negative for confusion and sleep disturbance. The patient is not nervous/anxious.        Objective:   Physical Exam  Constitutional: She appears well-developed. No distress.  obese  HENT:  Head: Normocephalic.  Right Ear: External ear normal.  Left Ear: External ear normal.  Nose: Nose normal.  Mouth/Throat: Oropharynx is clear and moist.  Eyes: Conjunctivae are normal. Pupils are equal, round, and reactive to light. Right eye exhibits no discharge. Left eye exhibits no discharge.  Neck: Normal range of motion. Neck supple. No JVD present. No tracheal deviation present. No thyromegaly present.  Cardiovascular: Normal rate, regular rhythm and normal heart sounds.   Pulmonary/Chest: No stridor. No respiratory distress. She has no wheezes.  Abdominal: Soft. Bowel sounds are normal. She exhibits no distension and no mass. There  is no tenderness. There is no rebound and no guarding.  Musculoskeletal: She exhibits tenderness (LS is tender w/ROM). She exhibits no edema.  Lymphadenopathy:    She has no cervical adenopathy.  Neurological: She displays normal reflexes. No cranial nerve deficit. She exhibits normal muscle tone. Coordination normal.  Skin: No rash noted. No erythema.  Psychiatric: She has a normal mood and affect. Her behavior is normal. Judgment and thought content normal.  R>L bunion    Lab Results  Component Value Date   WBC 5.2 06/24/2012   HGB 11.8* 06/24/2012   HCT 37.3 06/24/2012   PLT 216.0 06/24/2012   GLUCOSE 93 06/24/2012   CHOL 205* 06/24/2012   TRIG 146.0 06/24/2012   HDL 47.80 06/24/2012   LDLDIRECT 136.3 06/24/2012   ALT 15 06/24/2012   AST 22 06/24/2012   NA 135 06/24/2012   K 3.7 06/24/2012   CL 99 06/24/2012   CREATININE 1.0 06/24/2012   BUN 17 06/24/2012   CO2 27 06/24/2012   TSH 2.10 06/24/2012   HGBA1C 5.9 10/31/2011       Assessment & Plan:

## 2013-01-08 ENCOUNTER — Other Ambulatory Visit: Payer: Self-pay | Admitting: Internal Medicine

## 2013-01-22 ENCOUNTER — Telehealth: Payer: Self-pay | Admitting: Internal Medicine

## 2013-01-22 DIAGNOSIS — M25579 Pain in unspecified ankle and joints of unspecified foot: Secondary | ICD-10-CM

## 2013-01-22 NOTE — Telephone Encounter (Signed)
Pt is still having foot pain.  She is wanting a referral to a pediatrist.

## 2013-02-09 NOTE — Telephone Encounter (Signed)
Ok done

## 2013-02-10 NOTE — Telephone Encounter (Signed)
Pt is aware that [facility] will contact her with the appt.

## 2013-02-16 ENCOUNTER — Telehealth: Payer: Self-pay | Admitting: *Deleted

## 2013-02-16 DIAGNOSIS — Z0000 Encounter for general adult medical examination without abnormal findings: Secondary | ICD-10-CM

## 2013-02-16 NOTE — Telephone Encounter (Signed)
Labs entered.

## 2013-02-16 NOTE — Telephone Encounter (Signed)
Message copied by Merrilyn Puma on Mon Feb 16, 2013  4:37 PM ------      Message from: Etheleen Sia      Created: Wed Dec 31, 2012  9:42 AM      Regarding: LABS       PHYSICAL LABS FOR AUG APPT ------

## 2013-02-26 ENCOUNTER — Encounter: Payer: Self-pay | Admitting: Internal Medicine

## 2013-04-30 ENCOUNTER — Ambulatory Visit (INDEPENDENT_AMBULATORY_CARE_PROVIDER_SITE_OTHER): Payer: 59 | Admitting: Internal Medicine

## 2013-04-30 ENCOUNTER — Encounter: Payer: Self-pay | Admitting: Internal Medicine

## 2013-04-30 VITALS — BP 112/72 | HR 61 | Temp 98.3°F | Ht 63.0 in | Wt 189.0 lb

## 2013-04-30 DIAGNOSIS — H938X2 Other specified disorders of left ear: Secondary | ICD-10-CM

## 2013-04-30 DIAGNOSIS — H938X9 Other specified disorders of ear, unspecified ear: Secondary | ICD-10-CM

## 2013-04-30 DIAGNOSIS — R51 Headache: Secondary | ICD-10-CM

## 2013-04-30 NOTE — Patient Instructions (Signed)

## 2013-04-30 NOTE — Progress Notes (Signed)
Subjective:    Patient ID: Jane Mooney, female    DOB: 1951-05-08, 62 y.o.   MRN: [id number]  HPI  Pt presents to the clinic today with c/o left ear fullness and pain on the left side of the face. This started 2 days ago. She is having difficulty hearing out of that left ear. She reports that sounds are muffled. She did take Ibuprofen OTC which did help with the facial pain. She denies dizziness, nausea or vomiting. She has not recently started any new medications. She has not had any history of allergies or asthma. She has not had a problem with wax buildup. She denies any trauma to the ear.   Review of Systems      Past Medical History  Diagnosis Date  . Anxiety 2010  . Depression   . HTN (hypertension)   . DJD (degenerative joint disease)     Left knee  . MVP (mitral valve prolapse)   . Symptomatic PVCs   . Allergic rhinitis   . LBP (low back pain)   . Hyperlipidemia   . Hemorrhoid     Current Outpatient Prescriptions  Medication Sig Dispense Refill  . AZOR 10-40 MG per tablet TAKE 1 TABLET DAILY  90 tablet  1  . Cholecalciferol (VITAMIN D3) 1000 UNITS tablet Take 1,000 Units by mouth daily.        . clobetasol (OLUX) 0.05 % topical foam Apply topically as directed.      . Cyanocobalamin (VITAMIN B-12) 1000 MCG SUBL Place 1 tablet (1,000 mcg total) under the tongue daily.  100 tablet  3  . estradiol (ESTRACE VAGINAL) 0.1 MG/GM vaginal cream Place 1 g vaginally. Two times weekly.      . ferrous sulfate (FERRO-BOB) 325 (65 FE) MG tablet Take 325 mg by mouth daily. For anemia       . fish oil-omega-3 fatty acids 1000 MG capsule Take 1 g by mouth daily.        [name] ibuprofen (ADVIL,MOTRIN) 600 MG tablet Take 1 tablet (600 mg total) by mouth 2 (two) times daily as needed. For pain  60 tablet  3  . KLOR-CON M10 10 MEQ tablet TAKE 1 TABLET DAILY  90 tablet  0  . LORazepam (ATIVAN) 1 MG tablet TAKE 1 TABLET BY MOUTH 2 TIMES A DAY AS NEEDED FOR ANXIETY  60 tablet  3  . minocycline  (DYNACIN) 50 MG tablet Take 50 mg by mouth 2 (two) times daily.      [name] triamterene-hydrochlorothiazide (DYAZIDE) 37.5-25 MG per capsule TAKE 1 CAPSULE DAILY  90 capsule  1  . triamterene-hydrochlorothiazide (MAXZIDE-25) 37.5-25 MG per tablet Take 1 tablet by mouth daily.        [name] HYDROcodone-acetaminophen (VICODIN) 5-500 MG per tablet Take 1 tablet by mouth 2 (two) times daily as needed. As needed for pain  90 tablet  1  . hydrocortisone (ANUSOL-HC) 2.5 % rectal cream Apply rectally 2 times daily       No current facility-administered medications for this visit.    Allergies  Allergen Reactions  . Citalopram     weak  . Hydrochlorothiazide W-Triamterene   . Mirtazapine   . Quinapril Hcl     REACTION: cugh    Family History  Problem Relation Age of Onset  . Diabetes Other   . Hypertension Other   . Breast cancer Other   . Cancer Other     breast   . Hypertension Mother  History   Social History  . Marital Status: Married    Spouse Name: N/A    Number of Children: 2  . Years of Education: N/A   Occupational History  . Retired Charity fundraiser [employer]    01/2010   Social History Main Topics  . Smoking status: Never Smoker   . Smokeless tobacco: Not on file  . Alcohol Use: No  . Drug Use: No  . Sexually Active: Yes   Other Topics Concern  . Not on file   Social History Narrative  . No narrative on file     Constitutional: Denies fever, malaise, fatigue, headache or abrupt weight changes.  HEENT: Pt reports left ear fullness and facial pain. Denies eye pain, eye redness, ear pain, ringing in the ears, wax buildup, runny nose, nasal congestion, bloody nose, or sore throat. Neurological: Denies dizziness, difficulty with memory, difficulty with speech or problems with balance and coordination.   No other specific complaints in a complete review of systems (except as listed in HPI above).  Objective:   Physical Exam   BP 112/72  Pulse 61  Temp(Src) 98.3 F (36.8  C) (Oral)  Ht 5\' 3"  (1.6 m)  Wt 189 lb (85.73 kg)  BMI 33.49 kg/m2  SpO2 99% Wt Readings from Last 3 Encounters:  04/30/13 189 lb (85.73 kg)  12/31/12 184 lb (83.462 kg)  07/02/12 182 lb 8 oz (82.781 kg)    General: Appears her stated age, overweight but well developed, well nourished in NAD. Skin: Warm, dry and intact. No rashes, lesions or ulcerations noted. HEENT: Head: normal shape and size; Eyes: sclera white, no icterus, conjunctiva pink, PERRLA and EOMs intact; Ears: Tm's gray and intact, normal light reflex; Nose: mucosa pink and moist, septum midline; Throat/Mouth: Teeth present, mucosa pink and moist, no exudate, lesions or ulcerations noted.  Cardiovascular: Normal rate and rhythm. S1,S2 noted.  No murmur, rubs or gallops noted. No JVD or BLE edema. No carotid bruits noted. Pulmonary/Chest: Normal effort and positive vesicular breath sounds. No respiratory distress. No wheezes, rales or ronchi noted.   Neurological: Alert and oriented. Cranial nerves II-XII intact. Coordination normal. +DTRs bilaterally.   BMET    Component Value Date/Time   NA 135 06/24/2012 1035   K 3.7 06/24/2012 1035   CL 99 06/24/2012 1035   CO2 27 06/24/2012 1035   GLUCOSE 93 06/24/2012 1035   BUN 17 06/24/2012 1035   CREATININE 1.0 06/24/2012 1035   CALCIUM 9.4 06/24/2012 1035   GFRNONAA 71.59 10/31/2010 0807   GFRAA 94 09/24/2008 1155    Lipid Panel     Component Value Date/Time   CHOL 205* 06/24/2012 1035   TRIG 146.0 06/24/2012 1035   HDL 47.80 06/24/2012 1035   CHOLHDL 4 06/24/2012 1035   VLDL 29.2 06/24/2012 1035    CBC    Component Value Date/Time   WBC 5.2 06/24/2012 1035   RBC 4.59 06/24/2012 1035   HGB 11.8* 06/24/2012 1035   HCT 37.3 06/24/2012 1035   PLT 216.0 06/24/2012 1035   MCV 81.1 06/24/2012 1035   MCHC 31.8 06/24/2012 1035   RDW 13.9 06/24/2012 1035   LYMPHSABS 2.0 06/24/2012 1035   MONOABS 0.6 06/24/2012 1035   EOSABS 0.2 06/24/2012 1035   BASOSABS 0.0 06/24/2012 1035    Hgb  A1C Lab Results  Component Value Date   HGBA1C 5.9 10/31/2011         Assessment & Plan:   Left ear fullness with radiating facial  pain, new onset:  Seems to be getting better Monitor symptoms Let us know if pain persist, worsens or if you develop fever or dizziness Can you a hot water bladder against your ear Continue ibuprofen as needed

## 2013-07-03 ENCOUNTER — Encounter: Payer: 59 | Admitting: Internal Medicine

## 2013-07-06 ENCOUNTER — Ambulatory Visit (INDEPENDENT_AMBULATORY_CARE_PROVIDER_SITE_OTHER): Payer: 59

## 2013-07-06 DIAGNOSIS — Z0000 Encounter for general adult medical examination without abnormal findings: Secondary | ICD-10-CM

## 2013-07-06 LAB — URINALYSIS, ROUTINE W REFLEX MICROSCOPIC
Bilirubin Urine: NEGATIVE
Hgb urine dipstick: NEGATIVE
Nitrite: NEGATIVE
Total Protein, Urine: NEGATIVE
Urobilinogen, UA: 0.2 (ref 0.0–1.0)

## 2013-07-06 LAB — CBC WITH DIFFERENTIAL/PLATELET
Basophils Absolute: 0 10*3/uL (ref 0.0–0.1)
Eosinophils Absolute: 0.2 10*3/uL (ref 0.0–0.7)
Hemoglobin: 13.3 g/dL (ref 12.0–15.0)
Lymphocytes Relative: 33.7 % (ref 12.0–46.0)
MCHC: 33.5 g/dL (ref 30.0–36.0)
Monocytes Relative: 12.1 % — ABNORMAL HIGH (ref 3.0–12.0)
Neutro Abs: 2.9 10*3/uL (ref 1.4–7.7)
Neutrophils Relative %: 50.3 % (ref 43.0–77.0)
RDW: 13.6 % (ref 11.5–14.6)

## 2013-07-06 LAB — LIPID PANEL
HDL: 48.2 mg/dL (ref >39.00)
Total CHOL/HDL Ratio: 5
VLDL: 32 mg/dL (ref 0.0–40.0)

## 2013-07-06 LAB — BASIC METABOLIC PANEL
CO2: 29 mEq/L (ref 19–32)
Calcium: 9.9 mg/dL (ref 8.4–10.5)
Creatinine, Ser: 1.2 mg/dL (ref 0.4–1.2)
GFR: 59.89 mL/min — ABNORMAL LOW (ref >60.00)
Glucose, Bld: 103 mg/dL — ABNORMAL HIGH (ref 70–99)
Sodium: 138 mEq/L (ref 135–145)

## 2013-07-06 LAB — HEPATIC FUNCTION PANEL
AST: 18 U/L (ref 0–37)
Albumin: 4.3 g/dL (ref 3.5–5.2)
Alkaline Phosphatase: 75 U/L (ref 39–117)
Bilirubin, Direct: 0.1 mg/dL (ref 0.0–0.3)
Total Bilirubin: 0.5 mg/dL (ref 0.3–1.2)

## 2013-07-08 LAB — TSH: TSH: 0.79 u[IU]/mL (ref 0.35–5.50)

## 2013-07-10 ENCOUNTER — Encounter: Payer: Self-pay | Admitting: Internal Medicine

## 2013-07-10 ENCOUNTER — Ambulatory Visit (INDEPENDENT_AMBULATORY_CARE_PROVIDER_SITE_OTHER): Payer: 59 | Admitting: Internal Medicine

## 2013-07-10 VITALS — BP 110/80 | HR 80 | Temp 98.1°F | Resp 16 | Ht 63.5 in | Wt 189.0 lb

## 2013-07-10 DIAGNOSIS — I10 Essential (primary) hypertension: Secondary | ICD-10-CM

## 2013-07-10 DIAGNOSIS — Z23 Encounter for immunization: Secondary | ICD-10-CM

## 2013-07-10 DIAGNOSIS — E669 Obesity, unspecified: Secondary | ICD-10-CM

## 2013-07-10 DIAGNOSIS — M545 Low back pain, unspecified: Secondary | ICD-10-CM

## 2013-07-10 DIAGNOSIS — E538 Deficiency of other specified B group vitamins: Secondary | ICD-10-CM

## 2013-07-10 DIAGNOSIS — F411 Generalized anxiety disorder: Secondary | ICD-10-CM

## 2013-07-10 DIAGNOSIS — E559 Vitamin D deficiency, unspecified: Secondary | ICD-10-CM

## 2013-07-10 DIAGNOSIS — Z0000 Encounter for general adult medical examination without abnormal findings: Secondary | ICD-10-CM

## 2013-07-10 MED ORDER — HYDROCODONE-ACETAMINOPHEN 5-325 MG PO TABS: 1.0000 | ORAL_TABLET | Freq: Two times a day (BID) | ORAL | Status: DC | PRN

## 2013-07-10 MED ORDER — LORAZEPAM 1 MG PO TABS: ORAL_TABLET | ORAL | Status: DC

## 2013-07-10 MED ORDER — LORCASERIN HCL 10 MG PO TABS: 1.0000 | ORAL_TABLET | Freq: Two times a day (BID) | ORAL | Status: DC

## 2013-07-10 MED ORDER — TRIAMTERENE-HCTZ 37.5-25 MG PO TABS: 1.0000 | ORAL_TABLET | Freq: Every day | ORAL | Status: DC

## 2013-07-10 MED ORDER — AMLODIPINE-OLMESARTAN 10-40 MG PO TABS: ORAL_TABLET | ORAL | Status: DC

## 2013-07-10 NOTE — Patient Instructions (Addendum)
[email or web address]

## 2013-07-10 NOTE — Assessment & Plan Note (Addendum)
Will try Belviq  Wt Readings from Last 3 Encounters:  07/10/13 189 lb (85.73 kg)  04/30/13 189 lb (85.73 kg)  12/31/12 184 lb (83.462 kg)

## 2013-07-10 NOTE — Progress Notes (Signed)
   Subjective:     HPI  The patient is here for a wellness exam. The patient has been doing well overall without major physical or psychological issues going on lately  The patient is here to follow up on elev glu, chronic depression, anxiety, headaches and chronic moderate OA/LBP symptoms controlled with medicines, diet and exercise.  C/o obesity  Wt Readings from Last 3 Encounters:  07/10/13 189 lb (85.73 kg)  04/30/13 189 lb (85.73 kg)  12/31/12 184 lb (83.462 kg)   BP Readings from Last 3 Encounters:  07/10/13 110/80  04/30/13 112/72  12/31/12 116/76     Review of Systems  Constitutional: Negative for chills, activity change, appetite change, fatigue and unexpected weight change.  HENT: Negative for congestion, mouth sores and sinus pressure.   Eyes: Negative for visual disturbance.  Respiratory: Negative for cough and chest tightness.   Gastrointestinal: Negative for nausea and abdominal pain.  Genitourinary: Negative for frequency, difficulty urinating and vaginal pain.  Musculoskeletal: Negative for back pain and gait problem.  Skin: Negative for pallor and rash.  Neurological: Negative for dizziness, tremors, weakness, numbness and headaches.  Psychiatric/Behavioral: Negative for confusion and sleep disturbance. The patient is not nervous/anxious.        Objective:   Physical Exam  Constitutional: She appears well-developed. No distress.  obese  HENT:  Head: Normocephalic.  Right Ear: External ear normal.  Left Ear: External ear normal.  Nose: Nose normal.  Mouth/Throat: Oropharynx is clear and moist.  Eyes: Conjunctivae are normal. Pupils are equal, round, and reactive to light. Right eye exhibits no discharge. Left eye exhibits no discharge.  Neck: Normal range of motion. Neck supple. No JVD present. No tracheal deviation present. No thyromegaly present.  Cardiovascular: Normal rate, regular rhythm and normal heart sounds.   Pulmonary/Chest: No stridor. No  respiratory distress. She has no wheezes.  Abdominal: Soft. Bowel sounds are normal. She exhibits no distension and no mass. There is no tenderness. There is no rebound and no guarding.  Musculoskeletal: She exhibits tenderness (LS is tender w/ROM). She exhibits no edema.  Lymphadenopathy:    She has no cervical adenopathy.  Neurological: She displays normal reflexes. No cranial nerve deficit. She exhibits normal muscle tone. Coordination normal.  Skin: No rash noted. No erythema.  Psychiatric: She has a normal mood and affect. Her behavior is normal. Judgment and thought content normal.      Lab Results  Component Value Date   WBC 5.7 07/06/2013   HGB 13.3 07/06/2013   HCT 39.9 07/06/2013   PLT 233.0 07/06/2013   GLUCOSE 103* 07/06/2013   CHOL 222* 07/06/2013   TRIG 160.0* 07/06/2013   HDL 48.20 07/06/2013   LDLDIRECT 153.6 07/06/2013   ALT 14 07/06/2013   AST 18 07/06/2013   NA 138 07/06/2013   K 3.8 07/06/2013   CL 101 07/06/2013   CREATININE 1.2 07/06/2013   BUN 21 07/06/2013   CO2 29 07/06/2013   TSH 0.79 07/06/2013   HGBA1C 5.9 10/31/2011       Assessment & Plan:

## 2013-07-10 NOTE — Assessment & Plan Note (Signed)
We discussed age appropriate health related issues, including available/recomended screening tests and vaccinations. We discussed a need for adhering to healthy diet and exercise. Labs/EKG were reviewed/ordered. All questions were answered. GYN, mammo, Ophth q 12-24  mo Colon - [facility] will sch

## 2013-07-11 NOTE — Assessment & Plan Note (Signed)
Continue with current prescription therapy as reflected on the Med list.  

## 2013-08-25 ENCOUNTER — Other Ambulatory Visit (HOSPITAL_COMMUNITY): Payer: Self-pay | Admitting: Gynecology

## 2013-08-25 DIAGNOSIS — Z1231 Encounter for screening mammogram for malignant neoplasm of breast: Secondary | ICD-10-CM

## 2013-09-02 ENCOUNTER — Ambulatory Visit (INDEPENDENT_AMBULATORY_CARE_PROVIDER_SITE_OTHER): Payer: 59 | Admitting: Internal Medicine

## 2013-09-02 ENCOUNTER — Encounter: Payer: Self-pay | Admitting: Internal Medicine

## 2013-09-02 VITALS — BP 140/82 | HR 76 | Temp 98.6°F | Ht 63.5 in | Wt 191.0 lb

## 2013-09-02 DIAGNOSIS — J069 Acute upper respiratory infection, unspecified: Secondary | ICD-10-CM | POA: Insufficient documentation

## 2013-09-02 DIAGNOSIS — H109 Unspecified conjunctivitis: Secondary | ICD-10-CM | POA: Insufficient documentation

## 2013-09-02 DIAGNOSIS — I10 Essential (primary) hypertension: Secondary | ICD-10-CM

## 2013-09-02 MED ORDER — TOBRAMYCIN 0.3 % OP SOLN: 1.0000 [drp] | Freq: Four times a day (QID) | OPHTHALMIC | Status: DC

## 2013-09-02 MED ORDER — AZITHROMYCIN 250 MG PO TABS: ORAL_TABLET | ORAL | Status: DC

## 2013-09-02 NOTE — Assessment & Plan Note (Signed)
Mild to mod, for antibx course,  to f/u any worsening symptoms or concerns 

## 2013-09-02 NOTE — Patient Instructions (Signed)
Please take all new medication as prescribed  Please continue all other medications as before, and refills have been done if requested.  Please have the pharmacy call with any other refills you may need.   

## 2013-09-02 NOTE — Progress Notes (Signed)
Subjective:    Patient ID: Jane Mooney, female    DOB: 1951/02/25, 62 y.o.   MRN: [id number]  HPI   Here with 2-3 days acute onset fever, left eye conjunctival redness, irritaiton and d/c, headache, general weakness and malaise, with mild ST and cough, but pt denies chest pain, wheezing, increased sob or doe, orthopnea, PND, increased LE swelling, palpitations, dizziness or syncope.  Retired Engineer, civil (consulting). Pt denies new neurological symptoms such as new headache, or facial or extremity weakness or numbness.  Pt denies polydipsia, polyuria.     Past Medical History  Diagnosis Date  . Anxiety 2010  . Depression   . HTN (hypertension)   . DJD (degenerative joint disease)     Left knee  . MVP (mitral valve prolapse)   . Symptomatic PVCs   . Allergic rhinitis   . LBP (low back pain)   . Hyperlipidemia   . Hemorrhoid    Past Surgical History  Procedure Laterality Date  . Knee surgery  03/2007    Left Knee Arthr- Dr Rennis Chris  . Rotator cuff repair  2004    Left   . Abdominal hysterectomy  1984  . Tonsillectomy      reports that she has never smoked. She does not have any smokeless tobacco history on file. She reports that she does not drink alcohol or use illicit drugs. family history includes Breast cancer in her other; Cancer in her other; Diabetes in her other; Hypertension in her mother and other. Allergies  Allergen Reactions  . Citalopram     weak  . Hydrochlorothiazide W-Triamterene   . Mirtazapine   . Quinapril Hcl     REACTION: cugh   Current Outpatient Prescriptions on File Prior to Visit  Medication Sig Dispense Refill  . amLODipine-olmesartan (AZOR) 10-40 MG per tablet TAKE 1 TABLET DAILY  30 tablet  11  . Cholecalciferol (VITAMIN D3) 1000 UNITS tablet Take 1,000 Units by mouth daily.        . clobetasol (OLUX) 0.05 % topical foam Apply topically as directed.      . Cyanocobalamin (VITAMIN B-12) 1000 MCG SUBL Place 1 tablet (1,000 mcg total) under the tongue daily.  100  tablet  3  . estradiol (ESTRACE VAGINAL) 0.1 MG/GM vaginal cream Place 1 g vaginally. Two times weekly.      . ferrous sulfate (FERRO-BOB) 325 (65 FE) MG tablet Take 325 mg by mouth daily. For anemia       . fish oil-omega-3 fatty acids 1000 MG capsule Take 1 g by mouth daily.        [name] HYDROcodone-acetaminophen (NORCO/VICODIN) 5-325 MG per tablet Take 1 tablet by mouth 2 (two) times daily as needed for pain.  60 tablet  2  . ibuprofen (ADVIL,MOTRIN) 600 MG tablet Take 1 tablet (600 mg total) by mouth 2 (two) times daily as needed. For pain  60 tablet  3  . KLOR-CON M10 10 MEQ tablet TAKE 1 TABLET DAILY  90 tablet  0  . LORazepam (ATIVAN) 1 MG tablet TAKE 1 TABLET BY MOUTH 2 TIMES A DAY AS NEEDED FOR ANXIETY  60 tablet  5  . Lorcaserin HCl (BELVIQ) 10 MG TABS Take 1 tablet by mouth 2 (two) times daily.  60 tablet  3  . minocycline (DYNACIN) 50 MG tablet Take 50 mg by mouth 2 (two) times daily.      [name] triamterene-hydrochlorothiazide (MAXZIDE-25) 37.5-25 MG per tablet Take 1 tablet by mouth daily.  30 tablet  11  . hydrocortisone (ANUSOL-HC) 2.5 % rectal cream Apply rectally 2 times daily       No current facility-administered medications on file prior to visit.     Review of Systems  Constitutional: Negative for unexpected weight change, or unusual diaphoresis  HENT: Negative for tinnitus.   Eyes: Negative for photophobia and visual disturbance.  Respiratory: Negative for choking and stridor.   Gastrointestinal: Negative for vomiting and blood in stool.  Genitourinary: Negative for hematuria and decreased urine volume.  Musculoskeletal: Negative for acute joint swelling Skin: Negative for color change and wound.  Neurological: Negative for tremors and numbness other than noted  Psychiatric/Behavioral: Negative for decreased concentration or  hyperactivity.       Objective:   Physical Exam BP 140/82  Pulse 76  Temp(Src) 98.6 F (37 C) (Oral)  Ht 5' 3.5" (1.613 m)  Wt 191 lb (86.637  kg)  BMI 33.3 kg/m2  SpO2 99% VS noted,  Constitutional: Pt appears well-developed and well-nourished.  HENT: Head: NCAT.  Right Ear: External ear normal.  Left Ear: External ear normal.  Eyes: left Conjunctivae with mild to mod erythema, d/c, EOM are normal. Pupils are equal, round, and reactive to light.  Bilat tm's with mild erythema.  Max sinus areas non tender.  Pharynx with mild erythema, no exudate Neck: Normal range of motion. Neck supple.  Cardiovascular: Normal rate and regular rhythm.   Pulmonary/Chest: Effort normal and breath sounds normal.  Neurological: Pt is alert. Not confused  Skin: Skin is warm. No erythema.  Psychiatric: Pt behavior is normal. Thought content normal.     Assessment & Plan:

## 2013-09-02 NOTE — Assessment & Plan Note (Signed)
stable overall by history and exam, recent data reviewed with pt, and pt to continue medical treatment as before,  to f/u any worsening symptoms or concerns BP Readings from Last 3 Encounters:  09/02/13 140/82  07/10/13 110/80  04/30/13 112/72

## 2013-09-11 ENCOUNTER — Ambulatory Visit (HOSPITAL_COMMUNITY)
Admission: RE | Admit: 2013-09-11 | Discharge: 2013-09-11 | Disposition: A | Discharge status: Home / Self Care | Payer: 59 | Source: Ambulatory Visit | Attending: Gynecology | Admitting: Gynecology

## 2013-09-11 DIAGNOSIS — Z1231 Encounter for screening mammogram for malignant neoplasm of breast: Secondary | ICD-10-CM | POA: Insufficient documentation

## 2013-10-14 ENCOUNTER — Encounter: Payer: Self-pay | Admitting: Internal Medicine

## 2013-10-14 ENCOUNTER — Ambulatory Visit (INDEPENDENT_AMBULATORY_CARE_PROVIDER_SITE_OTHER): Payer: 59 | Admitting: Internal Medicine

## 2013-10-14 VITALS — BP 118/80 | HR 80 | Temp 98.6°F | Resp 16 | Wt 190.0 lb

## 2013-10-14 DIAGNOSIS — Z1211 Encounter for screening for malignant neoplasm of colon: Secondary | ICD-10-CM

## 2013-10-14 DIAGNOSIS — I10 Essential (primary) hypertension: Secondary | ICD-10-CM

## 2013-10-14 DIAGNOSIS — M25569 Pain in unspecified knee: Secondary | ICD-10-CM

## 2013-10-14 DIAGNOSIS — E538 Deficiency of other specified B group vitamins: Secondary | ICD-10-CM

## 2013-10-14 DIAGNOSIS — F329 Major depressive disorder, single episode, unspecified: Secondary | ICD-10-CM

## 2013-10-14 MED ORDER — METHYLPREDNISOLONE ACETATE 80 MG/ML IJ SUSP: 80.0000 mg | Freq: Once | INTRAMUSCULAR | Status: DC

## 2013-10-14 MED ORDER — IBUPROFEN 600 MG PO TABS: 600.0000 mg | ORAL_TABLET | Freq: Two times a day (BID) | ORAL | Status: DC | PRN

## 2013-10-14 MED ORDER — POTASSIUM CHLORIDE CRYS ER 10 MEQ PO TBCR: EXTENDED_RELEASE_TABLET | ORAL | Status: DC

## 2013-10-14 NOTE — Assessment & Plan Note (Signed)
b.anserina bursitis B See procedure

## 2013-10-14 NOTE — Assessment & Plan Note (Signed)
Continue with current prescription therapy as reflected on the Med list.  

## 2013-10-14 NOTE — Progress Notes (Signed)
Pre visit review using our clinic review tool, if applicable. No additional management support is needed unless otherwise documented below in the visit note. 

## 2013-10-14 NOTE — Patient Instructions (Signed)
Postprocedure instructions :    A Band-Aid should be left on for 12 hours. Injection therapy is not a cure itself. It is used in conjunction with other modalities. You can use nonsteroidal anti-inflammatories like ibuprofen , hot and cold compresses. Rest is recommended in the next 24 hours. You need to report immediately  if fever, chills or any signs of infection develop. 

## 2013-10-14 NOTE — Progress Notes (Signed)
Subjective:     HPI    The patient is here to follow up on elev glu, chronic depression, anxiety, headaches and chronic moderate OA/LBP symptoms controlled with medicines, diet and exercise. C/o R>L knee pain C/o obesity  Wt Readings from Last 3 Encounters:  10/14/13 190 lb (86.183 kg)  09/02/13 191 lb (86.637 kg)  07/10/13 189 lb (85.73 kg)   BP Readings from Last 3 Encounters:  10/14/13 118/80  09/02/13 140/82  07/10/13 110/80     Review of Systems  Constitutional: Negative for chills, activity change, appetite change, fatigue and unexpected weight change.  HENT: Negative for congestion, mouth sores and sinus pressure.   Eyes: Negative for visual disturbance.  Respiratory: Negative for cough and chest tightness.   Gastrointestinal: Negative for nausea and abdominal pain.  Genitourinary: Negative for frequency, difficulty urinating and vaginal pain.  Musculoskeletal: Negative for back pain and gait problem.  Skin: Negative for pallor and rash.  Neurological: Negative for dizziness, tremors, weakness, numbness and headaches.  Psychiatric/Behavioral: Negative for confusion and sleep disturbance. The patient is not nervous/anxious.        Objective:   Physical Exam  Constitutional: She appears well-developed. No distress.  obese  HENT:  Head: Normocephalic.  Right Ear: External ear normal.  Left Ear: External ear normal.  Nose: Nose normal.  Mouth/Throat: Oropharynx is clear and moist.  Eyes: Conjunctivae are normal. Pupils are equal, round, and reactive to light. Right eye exhibits no discharge. Left eye exhibits no discharge.  Neck: Normal range of motion. Neck supple. No JVD present. No tracheal deviation present. No thyromegaly present.  Cardiovascular: Normal rate, regular rhythm and normal heart sounds.   Pulmonary/Chest: No stridor. No respiratory distress. She has no wheezes.  Abdominal: Soft. Bowel sounds are normal. She exhibits no distension and no  mass. There is no tenderness. There is no rebound and no guarding.  Musculoskeletal: She exhibits tenderness (LS is tender w/ROM). She exhibits no edema.  Lymphadenopathy:    She has no cervical adenopathy.  Neurological: She displays normal reflexes. No cranial nerve deficit. She exhibits normal muscle tone. Coordination normal.  Skin: No rash noted. No erythema.  Psychiatric: She has a normal mood and affect. Her behavior is normal. Judgment and thought content normal.   B b.anserina are very tender    Lab Results  Component Value Date   WBC 5.7 07/06/2013   HGB 13.3 07/06/2013   HCT 39.9 07/06/2013   PLT 233.0 07/06/2013   GLUCOSE 103* 07/06/2013   CHOL 222* 07/06/2013   TRIG 160.0* 07/06/2013   HDL 48.20 07/06/2013   LDLDIRECT 153.6 07/06/2013   ALT 14 07/06/2013   AST 18 07/06/2013   NA 138 07/06/2013   K 3.8 07/06/2013   CL 101 07/06/2013   CREATININE 1.2 07/06/2013   BUN 21 07/06/2013   CO2 29 07/06/2013   TSH 0.79 07/06/2013   HGBA1C 5.9 10/31/2011    Procedure Note :    Procedure : Joint Injection,  Knee bursa anserina R, L   Indication: Bursitis with refractory  chronic pain.   Risks including unsuccessful procedure , bleeding, infection, bruising, skin atrophy and others were explained to the patient in detail as well as the benefits. Informed consent was obtained and signed.   Tthe patient was placed in a comfortable position. Skin was prepped with Betadine and alcohol  and anesthetized with a cooling spray. Then, a 3 cc syringe with a 1.5 inch long 25-gauge needle was used for  a bursa injection in a fan-like fasion with 3 mL of 2% lidocaine and 40 mg of Depo-Medrol in each knee.  Band-Aids were applied.   Tolerated well. Complications: None. Good pain relief following the procedure.       Assessment & Plan:

## 2013-10-20 ENCOUNTER — Encounter: Payer: Self-pay | Admitting: Internal Medicine

## 2013-12-09 ENCOUNTER — Ambulatory Visit (AMBULATORY_SURGERY_CENTER): Payer: 59 | Admitting: *Deleted

## 2013-12-09 ENCOUNTER — Encounter: Payer: Self-pay | Admitting: Internal Medicine

## 2013-12-09 VITALS — Ht 65.0 in | Wt 185.8 lb

## 2013-12-09 DIAGNOSIS — Z1211 Encounter for screening for malignant neoplasm of colon: Secondary | ICD-10-CM

## 2013-12-09 MED ORDER — PEG-KCL-NACL-NASULF-NA ASC-C 100 G PO SOLR: ORAL | Status: DC

## 2013-12-09 NOTE — Progress Notes (Signed)
No egg or soy allergy  Pt states she has post op nausea and vomiting

## 2013-12-23 ENCOUNTER — Encounter: Payer: Self-pay | Admitting: Internal Medicine

## 2013-12-23 ENCOUNTER — Ambulatory Visit (AMBULATORY_SURGERY_CENTER): Payer: 59 | Admitting: Internal Medicine

## 2013-12-23 VITALS — BP 124/78 | HR 54 | Temp 97.2°F | Resp 19 | Ht 65.0 in | Wt 185.0 lb

## 2013-12-23 DIAGNOSIS — D126 Benign neoplasm of colon, unspecified: Secondary | ICD-10-CM

## 2013-12-23 DIAGNOSIS — Z1211 Encounter for screening for malignant neoplasm of colon: Secondary | ICD-10-CM

## 2013-12-23 MED ORDER — SODIUM CHLORIDE 0.9 % IV SOLN: 500.0000 mL | INTRAVENOUS | Status: DC

## 2013-12-23 NOTE — Op Note (Signed)
[facility]  [address]. [place], [id number]   COLONOSCOPY PROCEDURE REPORT  PATIENT: Jane Mooney, Jane Mooney  MR#: [id number] BIRTHDATE: Mar 23, 1951 , 68  yrs. old GENDER: Female ENDOSCOPIST: Lafayette Dragon, MD REFERRED XI:[facility] Avel Sensor, M.D. PROCEDURE DATE:  12/23/2013 PROCEDURE:   Colonoscopy with cold biopsy polypectomy First Screening Colonoscopy - Avg.  risk and is 50 yrs.  old or older - No.  Prior Negative Screening - Now for repeat screening. 10 or more years since last screening  History of Adenoma - Now for follow-up colonoscopy & has been > or = to 3 yrs.  N/A  Polyps Removed Today? Yes. ASA CLASS:   Class II INDICATIONS:Average risk patient for colon cancer and colonoscopy September 2003 showed internal hemorrhoids. MEDICATIONS: MAC sedation, administered by CRNA and propofol (Diprivan) 300mg  IV  DESCRIPTION OF PROCEDURE:   After the risks benefits and alternatives of the procedure were thoroughly explained, informed consent was obtained.  A digital rectal exam revealed no abnormalities of the rectum.   The LB [id number]  endoscope was introduced through the anus and advanced to the cecum, which was identified by both the appendix and ileocecal valve. No adverse events experienced.   The quality of the prep was good, using MoviPrep  The instrument was then slowly withdrawn as the colon was fully examined.      COLON FINDINGS: A firm sessile polyp ranging between 3-65mm in size was found in the ascending colon.  A polypectomy was performed with cold forceps.  The resection was complete and the polyp tissue was completely retrieved.  Retroflexed views revealed no abnormalities. The time to cecum=8 minutes 16 seconds.  Withdrawal time=6 minutes 22 seconds.  The scope was withdrawn and the procedure completed. COMPLICATIONS: There were no complications.  ENDOSCOPIC IMPRESSION: Sessile polyp ranging between 3-53mm in size was found in  the ascending colon; polypectomy was performed with cold forceps  RECOMMENDATIONS: 1.  Await biopsy results 2.  high-fiber diet Call colonoscopy pending path report   eSigned:  Lafayette Dragon, MD 12/23/2013 11:55 AM   cc:

## 2013-12-23 NOTE — Progress Notes (Signed)
Called to room to assist during endoscopic procedure.  Patient ID and intended procedure confirmed with present staff. Received instructions for my participation in the procedure from the performing physician.  

## 2013-12-23 NOTE — Patient Instructions (Signed)
YOU HAD AN ENDOSCOPIC PROCEDURE TODAY AT [facility]: Refer to the procedure report that was given to you for any specific questions about what was found during the examination.  If the procedure report does not answer your questions, please call your gastroenterologist to clarify.  If you requested that your care partner not be given the details of your procedure findings, then the procedure report has been included in a sealed envelope for you to review at your convenience later.  YOU SHOULD EXPECT: Some feelings of bloating in the abdomen. Passage of more gas than usual.  Walking can help get rid of the air that was put into your GI tract during the procedure and reduce the bloating. If you had a lower endoscopy (such as a colonoscopy or flexible sigmoidoscopy) you may notice spotting of blood in your stool or on the toilet paper. If you underwent a bowel prep for your procedure, then you may not have a normal bowel movement for a few days.  DIET: Your first meal following the procedure should be a light meal and then it is ok to progress to your normal diet.  A half-sandwich or bowl of soup is an example of a good first meal.  Heavy or fried foods are harder to digest and may make you feel nauseous or bloated.  Likewise meals heavy in dairy and vegetables can cause extra gas to form and this can also increase the bloating.  Drink plenty of fluids but you should avoid alcoholic beverages for 24 hours.  ACTIVITY: Your care partner should take you home directly after the procedure.  You should plan to take it easy, moving slowly for the rest of the day.  You can resume normal activity the day after the procedure however you should NOT DRIVE or use heavy machinery for 24 hours (because of the sedation medicines used during the test).    SYMPTOMS TO REPORT IMMEDIATELY: A gastroenterologist can be reached at any hour.  During normal business hours, 8:30 AM to 5:00 PM Monday through Friday,  call [phone].  After hours and on weekends, please call the GI answering service at [phone] who will take a message and have the physician on call contact you.   Following lower endoscopy (colonoscopy or flexible sigmoidoscopy):  Excessive amounts of blood in the stool  Significant tenderness or worsening of abdominal pains  Swelling of the abdomen that is new, acute  Fever of 100F or higher  F  FOLLOW UP: If any biopsies were taken you will be contacted by phone or by letter within the next 1-3 weeks.  Call your gastroenterologist if you have not heard about the biopsies in 3 weeks.  Our staff will call the home number listed on your records the next business day following your procedure to check on you and address any questions or concerns that you may have at that time regarding the information given to you following your procedure. This is a courtesy call and so if there is no answer at the home number and we have not heard from you through the emergency physician on call, we will assume that you have returned to your regular daily activities without incident.  SIGNATURES/CONFIDENTIALITY: You and/or your care partner have signed paperwork which will be entered into your electronic medical record.  These signatures attest to the fact that that the information above on your After Visit Summary has been reviewed and is understood.  Full responsibility of the confidentiality  of this discharge information lies with you and/or your care-partner.   Information on polyps given to you today  Await pathology report  High fiber diet information given to you today

## 2013-12-24 ENCOUNTER — Telehealth: Payer: Self-pay

## 2013-12-24 NOTE — Telephone Encounter (Signed)
  Follow up Call-  Call back number 12/23/2013  Post procedure Call Back phone  # [phone]  Permission to leave phone message Yes     Patient questions:  Do you have a fever, pain , or abdominal swelling? no Pain Score  0 *  Have you tolerated food without any problems? yes  Have you been able to return to your normal activities? yes  Do you have any questions about your discharge instructions: Diet   no Medications  no Follow up visit  no  Do you have questions or concerns about your Care? no  Actions: * If pain score is 4 or above: No action needed, pain <4.

## 2013-12-28 ENCOUNTER — Encounter: Payer: Self-pay | Admitting: Internal Medicine

## 2013-12-30 ENCOUNTER — Encounter: Payer: Self-pay | Admitting: *Deleted

## 2014-01-18 ENCOUNTER — Encounter: Payer: Self-pay | Admitting: Internal Medicine

## 2014-01-18 ENCOUNTER — Ambulatory Visit (INDEPENDENT_AMBULATORY_CARE_PROVIDER_SITE_OTHER): Payer: 59 | Admitting: Internal Medicine

## 2014-01-18 VITALS — BP 140/70 | HR 76 | Temp 98.6°F | Resp 16 | Wt 189.0 lb

## 2014-01-18 DIAGNOSIS — M545 Low back pain, unspecified: Secondary | ICD-10-CM

## 2014-01-18 DIAGNOSIS — I10 Essential (primary) hypertension: Secondary | ICD-10-CM

## 2014-01-18 DIAGNOSIS — L509 Urticaria, unspecified: Secondary | ICD-10-CM

## 2014-01-18 DIAGNOSIS — E538 Deficiency of other specified B group vitamins: Secondary | ICD-10-CM

## 2014-01-18 MED ORDER — LORATADINE 10 MG PO TABS: 10.0000 mg | ORAL_TABLET | Freq: Every day | ORAL | Status: DC

## 2014-01-18 MED ORDER — METHYLPREDNISOLONE ACETATE 80 MG/ML IJ SUSP
80.0000 mg | Freq: Once | INTRAMUSCULAR | Status: AC
  Administered 2014-01-18: 80 mg via INTRAMUSCULAR

## 2014-01-18 MED ORDER — TRIAMCINOLONE ACETONIDE 0.5 % EX CREA: 1.0000 "application " | TOPICAL_CREAM | Freq: Three times a day (TID) | CUTANEOUS | Status: DC

## 2014-01-18 MED ORDER — HYDROXYZINE HCL 25 MG PO TABS: 25.0000 mg | ORAL_TABLET | Freq: Three times a day (TID) | ORAL | Status: DC | PRN

## 2014-01-18 NOTE — Assessment & Plan Note (Signed)
Continue with current prescription therapy as reflected on the Med list.  

## 2014-01-18 NOTE — Assessment & Plan Note (Signed)
3/15 ?etiology off and on x 10 years Claritin  Milk free trial (no milk, ice cream, cheese and yogurt) for 4-6 weeks. OK to use almond, coconut, rice or soy milk. "Almond breeze" brand tastes good.

## 2014-01-18 NOTE — Progress Notes (Signed)
   Subjective:     HPI  C/o hives off and on x3 over past week (h/o hives)  The patient is here to follow up on elev glu, chronic depression, anxiety, headaches and chronic moderate OA/LBP symptoms controlled with medicines, diet and exercise. F/u R>L knee pain F/u obesity  Wt Readings from Last 3 Encounters:  01/18/14 189 lb (85.73 kg)  12/23/13 185 lb (83.915 kg)  12/09/13 185 lb 12.8 oz (84.278 kg)   BP Readings from Last 3 Encounters:  01/18/14 140/70  12/23/13 124/78  10/14/13 118/80     Review of Systems  Constitutional: Negative for chills, activity change, appetite change, fatigue and unexpected weight change.  HENT: Negative for congestion, mouth sores and sinus pressure.   Eyes: Negative for visual disturbance.  Respiratory: Negative for cough and chest tightness.   Gastrointestinal: Negative for nausea and abdominal pain.  Genitourinary: Negative for frequency, difficulty urinating and vaginal pain.  Musculoskeletal: Negative for back pain and gait problem.  Skin: Negative for pallor and rash.  Neurological: Negative for dizziness, tremors, weakness, numbness and headaches.  Psychiatric/Behavioral: Negative for confusion and sleep disturbance. The patient is not nervous/anxious.        Objective:   Physical Exam  Constitutional: She appears well-developed. No distress.  obese  HENT:  Head: Normocephalic.  Right Ear: External ear normal.  Left Ear: External ear normal.  Nose: Nose normal.  Mouth/Throat: Oropharynx is clear and moist.  Eyes: Conjunctivae are normal. Pupils are equal, round, and reactive to light. Right eye exhibits no discharge. Left eye exhibits no discharge.  Neck: Normal range of motion. Neck supple. No JVD present. No tracheal deviation present. No thyromegaly present.  Cardiovascular: Normal rate, regular rhythm and normal heart sounds.   Pulmonary/Chest: No stridor. No respiratory distress. She has no wheezes.  Abdominal: Soft. Bowel  sounds are normal. She exhibits no distension and no mass. There is no tenderness. There is no rebound and no guarding.  Musculoskeletal: She exhibits tenderness (LS is tender w/ROM). She exhibits no edema.  Lymphadenopathy:    She has no cervical adenopathy.  Neurological: She displays normal reflexes. No cranial nerve deficit. She exhibits normal muscle tone. Coordination normal.  Skin: No rash noted. No erythema.  Psychiatric: She has a normal mood and affect. Her behavior is normal. Judgment and thought content normal.   pictures of hives   Lab Results  Component Value Date   WBC 5.7 07/06/2013   HGB 13.3 07/06/2013   HCT 39.9 07/06/2013   PLT 233.0 07/06/2013   GLUCOSE 103* 07/06/2013   CHOL 222* 07/06/2013   TRIG 160.0* 07/06/2013   HDL 48.20 07/06/2013   LDLDIRECT 153.6 07/06/2013   ALT 14 07/06/2013   AST 18 07/06/2013   NA 138 07/06/2013   K 3.8 07/06/2013   CL 101 07/06/2013   CREATININE 1.2 07/06/2013   BUN 21 07/06/2013   CO2 29 07/06/2013   TSH 0.79 07/06/2013   HGBA1C 5.9 10/31/2011     Assessment & Plan:

## 2014-01-18 NOTE — Progress Notes (Signed)
Pre visit review using our clinic review tool, if applicable. No additional management support is needed unless otherwise documented below in the visit note. 

## 2014-01-18 NOTE — Patient Instructions (Signed)
   Milk free trial (no milk, ice cream, cheese and yogurt) for 4-6 weeks. OK to use almond, coconut, rice or soy milk. "Almond breeze" brand tastes good.  

## 2014-02-25 ENCOUNTER — Other Ambulatory Visit: Payer: Self-pay | Admitting: Internal Medicine

## 2014-03-22 ENCOUNTER — Ambulatory Visit (INDEPENDENT_AMBULATORY_CARE_PROVIDER_SITE_OTHER): Payer: 59 | Admitting: Internal Medicine

## 2014-03-22 ENCOUNTER — Encounter: Payer: Self-pay | Admitting: Internal Medicine

## 2014-03-22 VITALS — BP 158/82 | HR 72 | Temp 98.6°F | Resp 16 | Wt 188.0 lb

## 2014-03-22 DIAGNOSIS — L500 Allergic urticaria: Secondary | ICD-10-CM

## 2014-03-22 DIAGNOSIS — F4321 Adjustment disorder with depressed mood: Secondary | ICD-10-CM

## 2014-03-22 DIAGNOSIS — F411 Generalized anxiety disorder: Secondary | ICD-10-CM

## 2014-03-22 DIAGNOSIS — E538 Deficiency of other specified B group vitamins: Secondary | ICD-10-CM

## 2014-03-22 DIAGNOSIS — L509 Urticaria, unspecified: Secondary | ICD-10-CM

## 2014-03-22 DIAGNOSIS — E669 Obesity, unspecified: Secondary | ICD-10-CM

## 2014-03-22 DIAGNOSIS — I10 Essential (primary) hypertension: Secondary | ICD-10-CM

## 2014-03-22 NOTE — Assessment & Plan Note (Signed)
Continue with current prescription therapy as reflected on the Med list.  

## 2014-03-22 NOTE — Assessment & Plan Note (Addendum)
Continue with current prescription therapy as reflected on the Med list - Atarax and off dairy

## 2014-03-22 NOTE — Assessment & Plan Note (Signed)
2023/03/12 63 yo mother died

## 2014-03-22 NOTE — Assessment & Plan Note (Signed)
Wt Readings from Last 3 Encounters:  03/22/14 188 lb (85.276 kg)  01/18/14 189 lb (85.73 kg)  12/23/13 185 lb (83.915 kg)

## 2014-03-22 NOTE — Assessment & Plan Note (Signed)
BP is ok at home Continue with current prescription therapy as reflected on the Med list. BP Readings from Last 3 Encounters:  03/22/14 158/82  01/18/14 140/70  12/23/13 124/78

## 2014-03-22 NOTE — Progress Notes (Signed)
Pre visit review using our clinic review tool, if applicable. No additional management support is needed unless otherwise documented below in the visit note. 

## 2014-03-24 ENCOUNTER — Ambulatory Visit (INDEPENDENT_AMBULATORY_CARE_PROVIDER_SITE_OTHER): Payer: 59 | Admitting: Internal Medicine

## 2014-03-24 ENCOUNTER — Encounter: Payer: Self-pay | Admitting: Internal Medicine

## 2014-03-24 VITALS — BP 108/70 | HR 65 | Temp 99.0°F | Resp 14 | Wt 184.6 lb

## 2014-03-24 DIAGNOSIS — J209 Acute bronchitis, unspecified: Secondary | ICD-10-CM

## 2014-03-24 MED ORDER — HYDROCODONE-HOMATROPINE 5-1.5 MG/5ML PO SYRP: 5.0000 mL | ORAL_SOLUTION | Freq: Four times a day (QID) | ORAL | Status: DC | PRN

## 2014-03-24 MED ORDER — AMOXICILLIN 500 MG PO CAPS: 500.0000 mg | ORAL_CAPSULE | Freq: Three times a day (TID) | ORAL | Status: DC

## 2014-03-24 NOTE — Progress Notes (Signed)
   Subjective:    Patient ID: Jane Mooney, female    DOB: Jul 03, 1951, 63 y.o.   MRN: [id number]  HPI    Symptoms began 03/20/14 the sore throat which persisted until 5/10. She developed a cough which is nonproductive no paroxysmal. Last 20-60 minutes at a time. As result in severe discomfort in the anterior left thorax.  She is not exposed to any ill individuals but was cleaning her mother's house and was exposed to pollen and dust.  She's had some itchy eyes. As of 5/12 she's produce some green cleaning.  She's had low-grade fever to 99.9. She may have had some slight wheezing  She does have pressure pain in the frontal and maxillary sinuses.  She's never smoked. She has no history of asthma.    Review of Systems  She denies any nasal purulence, hemoptysis, or dyspnea.        Objective:   Physical Exam General appearance:good health ;well nourished; no acute distress or increased work of breathing is present.  No  lymphadenopathy about the head, neck, or axilla noted.   Eyes: No conjunctival inflammation or lid edema is present.   Ears:  External ear exam shows no significant lesions or deformities.  Otoscopic examination reveals clear canals, tympanic membranes are intact bilaterally without bulging, retraction, inflammation or discharge.  Nose:  External nasal examination shows no deformity or inflammation. Nasal mucosa are dry  without lesions or exudates. No septal dislocation or deviation.No obstruction to airflow.   Oral exam: Dental hygiene is good; lips and gums are healthy appearing.There is no oropharyngeal erythema or exudate noted.   Neck:  No deformities, thyromegaly, masses, or tenderness noted.    Heart:  Normal rate and regular rhythm. S1 and S2 normal without gallop, click, rub or other extra sounds. Grade 9/9-8 systolic murmur  Lungs:Chest clear to auscultation; no wheezes, rhonchi,rales ,or rubs present.No increased work of breathing.  Dry  cough  Extremities:  No cyanosis, edema, or clubbing  noted    Skin: Warm & dry w/o jaundice or tenting.         Assessment & Plan:  #1 acute bronchitis  #2 upper respiratory tract symptoms without nasal purulence  See orders and after visit summary

## 2014-03-24 NOTE — Progress Notes (Signed)
Pre visit review using our clinic review tool, if applicable. No additional management support is needed unless otherwise documented below in the visit note. 

## 2014-03-24 NOTE — Patient Instructions (Signed)

## 2014-03-29 ENCOUNTER — Encounter: Payer: Self-pay | Admitting: Internal Medicine

## 2014-03-29 DIAGNOSIS — L500 Allergic urticaria: Secondary | ICD-10-CM | POA: Insufficient documentation

## 2014-03-29 NOTE — Assessment & Plan Note (Signed)
Resolved

## 2014-03-29 NOTE — Progress Notes (Signed)
   Subjective:     HPI  F/u hives - resolved  The patient is here to follow up on elev glu, chronic depression, anxiety, headaches and chronic moderate OA/LBP symptoms controlled with medicines, diet and exercise. F/u R>L knee pain F/u obesity  Wt Readings from Last 3 Encounters:  03/24/14 184 lb 9.6 oz (83.734 kg)  03/22/14 188 lb (85.276 kg)  01/18/14 189 lb (85.73 kg)   BP Readings from Last 3 Encounters:  03/24/14 108/70  03/22/14 158/82  01/18/14 140/70     Review of Systems  Constitutional: Negative for chills, activity change, appetite change, fatigue and unexpected weight change.  HENT: Negative for congestion, mouth sores and sinus pressure.   Eyes: Negative for visual disturbance.  Respiratory: Negative for cough and chest tightness.   Gastrointestinal: Negative for nausea and abdominal pain.  Genitourinary: Negative for frequency, difficulty urinating and vaginal pain.  Musculoskeletal: Negative for back pain and gait problem.  Skin: Negative for pallor and rash.  Neurological: Negative for dizziness, tremors, weakness, numbness and headaches.  Psychiatric/Behavioral: Negative for confusion and sleep disturbance. The patient is not nervous/anxious.        Objective:   Physical Exam  Constitutional: She appears well-developed. No distress.  obese  HENT:  Head: Normocephalic.  Right Ear: External ear normal.  Left Ear: External ear normal.  Nose: Nose normal.  Mouth/Throat: Oropharynx is clear and moist.  Eyes: Conjunctivae are normal. Pupils are equal, round, and reactive to light. Right eye exhibits no discharge. Left eye exhibits no discharge.  Neck: Normal range of motion. Neck supple. No JVD present. No tracheal deviation present. No thyromegaly present.  Cardiovascular: Normal rate, regular rhythm and normal heart sounds.   Pulmonary/Chest: No stridor. No respiratory distress. She has no wheezes.  Abdominal: Soft. Bowel sounds are normal. She  exhibits no distension and no mass. There is no tenderness. There is no rebound and no guarding.  Musculoskeletal: She exhibits tenderness (LS is tender w/ROM). She exhibits no edema.  Lymphadenopathy:    She has no cervical adenopathy.  Neurological: She displays normal reflexes. No cranial nerve deficit. She exhibits normal muscle tone. Coordination normal.  Skin: No rash noted. No erythema.  Psychiatric: She has a normal mood and affect. Her behavior is normal. Judgment and thought content normal.      Lab Results  Component Value Date   WBC 5.7 07/06/2013   HGB 13.3 07/06/2013   HCT 39.9 07/06/2013   PLT 233.0 07/06/2013   GLUCOSE 103* 07/06/2013   CHOL 222* 07/06/2013   TRIG 160.0* 07/06/2013   HDL 48.20 07/06/2013   LDLDIRECT 153.6 07/06/2013   ALT 14 07/06/2013   AST 18 07/06/2013   NA 138 07/06/2013   K 3.8 07/06/2013   CL 101 07/06/2013   CREATININE 1.2 07/06/2013   BUN 21 07/06/2013   CO2 29 07/06/2013   TSH 0.79 07/06/2013   HGBA1C 5.9 10/31/2011     Assessment & Plan:

## 2014-04-02 ENCOUNTER — Telehealth: Payer: Self-pay | Admitting: Internal Medicine

## 2014-04-02 NOTE — Telephone Encounter (Signed)
OK to fill this prescription with additional refills x0 Thank you!  

## 2014-04-02 NOTE — Telephone Encounter (Signed)
Pt called request refill for hydrocodone. Please call pt °

## 2014-04-06 ENCOUNTER — Encounter: Payer: Self-pay | Admitting: *Deleted

## 2014-04-06 MED ORDER — HYDROCODONE-ACETAMINOPHEN 5-325 MG PO TABS: 1.0000 | ORAL_TABLET | Freq: Two times a day (BID) | ORAL | Status: DC | PRN

## 2014-04-06 NOTE — Telephone Encounter (Signed)
Rx printed. Pt informed.

## 2014-04-16 ENCOUNTER — Ambulatory Visit (INDEPENDENT_AMBULATORY_CARE_PROVIDER_SITE_OTHER)
Admission: RE | Admit: 2014-04-16 | Discharge: 2014-04-16 | Disposition: A | Discharge status: Home / Self Care | Payer: 59 | Source: Ambulatory Visit | Attending: Internal Medicine | Admitting: Internal Medicine

## 2014-04-16 ENCOUNTER — Ambulatory Visit (INDEPENDENT_AMBULATORY_CARE_PROVIDER_SITE_OTHER): Payer: 59 | Admitting: Internal Medicine

## 2014-04-16 ENCOUNTER — Encounter: Payer: Self-pay | Admitting: Internal Medicine

## 2014-04-16 VITALS — BP 150/80 | HR 69 | Temp 98.7°F | Ht 63.0 in | Wt 189.4 lb

## 2014-04-16 DIAGNOSIS — I10 Essential (primary) hypertension: Secondary | ICD-10-CM

## 2014-04-16 DIAGNOSIS — F329 Major depressive disorder, single episode, unspecified: Secondary | ICD-10-CM

## 2014-04-16 DIAGNOSIS — M25569 Pain in unspecified knee: Secondary | ICD-10-CM

## 2014-04-16 DIAGNOSIS — E538 Deficiency of other specified B group vitamins: Secondary | ICD-10-CM

## 2014-04-16 DIAGNOSIS — F3289 Other specified depressive episodes: Secondary | ICD-10-CM

## 2014-04-16 MED ORDER — METHYLPREDNISOLONE ACETATE 80 MG/ML IJ SUSP: 80.0000 mg | Freq: Once | INTRAMUSCULAR | Status: DC

## 2014-04-16 MED ORDER — LORAZEPAM 1 MG PO TABS: ORAL_TABLET | ORAL | Status: DC

## 2014-04-16 MED ORDER — DULOXETINE HCL 20 MG PO CPEP: 20.0000 mg | ORAL_CAPSULE | Freq: Every day | ORAL | Status: DC

## 2014-04-16 MED ORDER — METHYLPREDNISOLONE ACETATE 40 MG/ML IJ SUSP
40.0000 mg | Freq: Once | INTRAMUSCULAR | Status: AC
  Administered 2014-04-16: 40 mg via INTRA_ARTICULAR

## 2014-04-16 NOTE — Progress Notes (Signed)
Subjective:     HPI    The patient is here to follow up on elev glu, chronic depression, anxiety, headaches and chronic moderate OA/LBP symptoms controlled with medicines, diet and exercise. C/o R>>L knee pain   Wt Readings from Last 3 Encounters:  04/16/14 189 lb 6.4 oz (85.911 kg)  03/24/14 184 lb 9.6 oz (83.734 kg)  03/22/14 188 lb (85.276 kg)   BP Readings from Last 3 Encounters:  04/16/14 150/80  03/24/14 108/70  03/22/14 158/82     Review of Systems  Constitutional: Negative for chills, activity change, appetite change, fatigue and unexpected weight change.  HENT: Negative for congestion, mouth sores and sinus pressure.   Eyes: Negative for visual disturbance.  Respiratory: Negative for cough and chest tightness.   Gastrointestinal: Negative for nausea and abdominal pain.  Genitourinary: Negative for frequency, difficulty urinating and vaginal pain.  Musculoskeletal: Negative for back pain and gait problem.  Skin: Negative for pallor and rash.  Neurological: Negative for dizziness, tremors, weakness, numbness and headaches.  Psychiatric/Behavioral: Negative for confusion and sleep disturbance. The patient is not nervous/anxious.        Objective:   Physical Exam  Constitutional: She appears well-developed. No distress.  obese  HENT:  Head: Normocephalic.  Right Ear: External ear normal.  Left Ear: External ear normal.  Nose: Nose normal.  Mouth/Throat: Oropharynx is clear and moist.  Eyes: Conjunctivae are normal. Pupils are equal, round, and reactive to light. Right eye exhibits no discharge. Left eye exhibits no discharge.  Neck: Normal range of motion. Neck supple. No JVD present. No tracheal deviation present. No thyromegaly present.  Cardiovascular: Normal rate, regular rhythm and normal heart sounds.   Pulmonary/Chest: No stridor. No respiratory distress. She has no wheezes.  Abdominal: Soft. Bowel sounds are normal. She exhibits no distension and no  mass. There is no tenderness. There is no rebound and no guarding.  Musculoskeletal: She exhibits tenderness (LS is tender w/ROM). She exhibits no edema.  Lymphadenopathy:    She has no cervical adenopathy.  Neurological: She displays normal reflexes. No cranial nerve deficit. She exhibits normal muscle tone. Coordination normal.  Skin: No rash noted. No erythema.  Psychiatric: She has a normal mood and affect. Her behavior is normal. Judgment and thought content normal.   B b.anserina are very tender R>>L    Lab Results  Component Value Date   WBC 5.7 07/06/2013   HGB 13.3 07/06/2013   HCT 39.9 07/06/2013   PLT 233.0 07/06/2013   GLUCOSE 103* 07/06/2013   CHOL 222* 07/06/2013   TRIG 160.0* 07/06/2013   HDL 48.20 07/06/2013   LDLDIRECT 153.6 07/06/2013   ALT 14 07/06/2013   AST 18 07/06/2013   NA 138 07/06/2013   K 3.8 07/06/2013   CL 101 07/06/2013   CREATININE 1.2 07/06/2013   BUN 21 07/06/2013   CO2 29 07/06/2013   TSH 0.79 07/06/2013   HGBA1C 5.9 10/31/2011    Procedure Note :    Procedure : Joint Injection,  Knee bursa anserina R, L   Indication: Bursitis with refractory  chronic pain.   Risks including unsuccessful procedure , bleeding, infection, bruising, skin atrophy and others were explained to the patient in detail as well as the benefits. Informed consent was obtained and signed.   Tthe patient was placed in a comfortable position. Skin was prepped with Betadine and alcohol  and anesthetized with a cooling spray. Then, a 3 cc syringe with a 1.5 inch long 25-gauge  needle was used for a bursa injection in a fan-like fasion with 3 mL of 2% lidocaine and 40 mg of Depo-Medrol in each knee.  Band-Aids were applied.   Tolerated well. Complications: None. Good pain relief following the procedure.       Assessment & Plan:

## 2014-04-16 NOTE — Assessment & Plan Note (Signed)
R>>L R knee Xray Will inject B b.anserina

## 2014-04-16 NOTE — Assessment & Plan Note (Signed)
Continue with current prescription therapy as reflected on the Med list.  

## 2014-04-16 NOTE — Progress Notes (Signed)
Pre visit review using our clinic review tool, if applicable. No additional management support is needed unless otherwise documented below in the visit note. 

## 2014-04-16 NOTE — Assessment & Plan Note (Signed)
Will try Cymbalta 20 mg/d - titrate up to 40 - 60 mg/d

## 2014-05-06 ENCOUNTER — Ambulatory Visit (INDEPENDENT_AMBULATORY_CARE_PROVIDER_SITE_OTHER): Payer: 59 | Admitting: Internal Medicine

## 2014-05-06 ENCOUNTER — Encounter: Payer: Self-pay | Admitting: Internal Medicine

## 2014-05-06 VITALS — BP 140/70 | HR 54 | Temp 98.5°F

## 2014-05-06 DIAGNOSIS — S8990XA Unspecified injury of unspecified lower leg, initial encounter: Secondary | ICD-10-CM

## 2014-05-06 DIAGNOSIS — S99929A Unspecified injury of unspecified foot, initial encounter: Secondary | ICD-10-CM

## 2014-05-06 DIAGNOSIS — S99919A Unspecified injury of unspecified ankle, initial encounter: Secondary | ICD-10-CM

## 2014-05-06 DIAGNOSIS — S8991XA Unspecified injury of right lower leg, initial encounter: Secondary | ICD-10-CM

## 2014-05-06 MED ORDER — HYDROCODONE-ACETAMINOPHEN 2.5-500 MG PO TABS: 1.0000 | ORAL_TABLET | ORAL | Status: DC | PRN

## 2014-05-06 NOTE — Progress Notes (Signed)
Pre visit review using our clinic review tool, if applicable. No additional management support is needed unless otherwise documented below in the visit note. 

## 2014-05-06 NOTE — Patient Instructions (Signed)
The Orthopedic referral will be scheduled  ASAP and you'll be notified of the time.

## 2014-05-06 NOTE — Progress Notes (Signed)
   Subjective:    Patient ID: Jane Mooney, female    DOB: [birth year], 63 y.o.   MRN: [id number]  HPI Pt was seen 04/16/14 for bilateral knee pain, with more pain in the R knee by Dr. Alain Marion. At this time the pt received a cortisone injection and had a 4vw Xray of her R knee that was negative.   Last night pt reports she was walking down the stairs and felt something popped in the front of her knee. She did not hear a pop, but felt one. She did not fall when this happened nor did she have immediate swelling. Since this time she has not been able to bare weight or move or leg without extreme pain. The pain goes down the medial side of the patella and across the inferior aspect of the patella. The pain is throbbing in nature. There is no radiation of the pain into the feet. While sitting the pt rates her pain as a 4/10. When she tries to walk or move the leg it is a 10/10. Pt has taken some Vicodin she had at home which helped very little. She has also been icing and using heat on her knee, since the time of injury.   Pt denies a history of gout.     Review of Systems  Constitutional: Negative for fever.  Skin: Negative for color change and rash.  Neurological: Negative for numbness.       No tingling in feet       Objective:   Physical Exam  Moderate swelling of the medial aspect of the patella  Marked pain upon palpation superior to the tibial head and across the medial aspect of the knee.  There is no ecchymosis.  Unable to perform ROM due to pain.  Unable to assess ligaments due to pain.      Assessment & Plan:  #1 acute knee injury; would consider an MRI of the knee to assess for ligament damage. Send pt to Dr. Tamala Julian to have bedside US to assess ligament status.

## 2014-05-06 NOTE — Progress Notes (Signed)
   Subjective:    Patient ID: Jane Mooney, female    DOB: [birth year], 63 y.o.   MRN: [id number]  HPI Approximately 10:00 last night she was walking down the stairs when she felt something pop in the medial aspect of the right knee. She did not fall but was unable to bear weight. She's used ice since that time. She also found leftover Vicodin which helped minimally. Her pain was rated up to a level X on 10 scale.    She has had arthroscopic surgery on the left for medial meniscus tear. She states that the presentation was similar. Review of Systems   She received steroid injections in the knees intra-articularly 04/16/14 for bilateral knee pain . Films at that time were negative.       Objective:   Physical Exam   She appears well nourished; she is uncomfortable at this time. She is in a wheelchair and nonweightbearing  There is a valgus deformity of the lower extremities. There is fusiform enlargement of both knees.  She resists any rotation or palpation of the right knee area. The tenderness is most pronounced over the medial tibial head area. She also is tenderness to palpation of the meniscus line in this area. The pain was exacerbated by lateral rotation of the tibia and eversion of the foot.  Skin appears normal with no bruising.  Pedal pulses are intact.        Assessment & Plan:  #1 possible tear of the tibial collateral ligament versus  injury of medial meniscus

## 2014-05-07 ENCOUNTER — Other Ambulatory Visit: Payer: Self-pay | Admitting: Internal Medicine

## 2014-05-07 ENCOUNTER — Telehealth: Payer: Self-pay

## 2014-05-07 DIAGNOSIS — M25561 Pain in right knee: Secondary | ICD-10-CM

## 2014-05-07 DIAGNOSIS — S99929A Unspecified injury of unspecified foot, initial encounter: Principal | ICD-10-CM

## 2014-05-07 DIAGNOSIS — S99919A Unspecified injury of unspecified ankle, initial encounter: Principal | ICD-10-CM

## 2014-05-07 DIAGNOSIS — S8990XA Unspecified injury of unspecified lower leg, initial encounter: Secondary | ICD-10-CM

## 2014-05-07 NOTE — Telephone Encounter (Signed)
Phone call from Jane Mooney [phone] stating she was seen yesterday and is to be referred to Orthopedics, Dr Onnie Graham. Dr Onnie Graham is not available so she is wanting a referral to [facility]

## 2014-05-07 NOTE — Telephone Encounter (Signed)
Patient called again regarding ortho referral. She states her appointment with Dr. Onnie Graham was canceled and is requesting to see someone at [facility]. I see the documentation in the office note, however no referral was entered. I spoke w/[facility] and scheduled pt appt w/Dr. Alfonso Ramus today, 05/07/14, at 3:00pm. Please enter ortho referral so we may properly document this referral. Thank you.

## 2014-05-19 ENCOUNTER — Encounter (HOSPITAL_BASED_OUTPATIENT_CLINIC_OR_DEPARTMENT_OTHER): Payer: Self-pay | Admitting: *Deleted

## 2014-05-20 ENCOUNTER — Other Ambulatory Visit: Payer: Self-pay

## 2014-05-20 ENCOUNTER — Encounter (HOSPITAL_BASED_OUTPATIENT_CLINIC_OR_DEPARTMENT_OTHER)
Admission: RE | Admit: 2014-05-20 | Discharge: 2014-05-20 | Disposition: A | Discharge status: Home / Self Care | Payer: 59 | Source: Ambulatory Visit | Attending: Orthopedic Surgery | Admitting: Orthopedic Surgery

## 2014-05-20 LAB — BASIC METABOLIC PANEL
Anion gap: 14 (ref 5–15)
BUN: 15 mg/dL (ref 6–23)
CALCIUM: 9.6 mg/dL (ref 8.4–10.5)
CO2: 27 mEq/L (ref 19–32)
Chloride: 102 mEq/L (ref 96–112)
Creatinine, Ser: 0.78 mg/dL (ref 0.50–1.10)
GFR calc Af Amer: >90 mL/min (ref >90)
GFR calc non Af Amer: 87 mL/min — ABNORMAL LOW (ref >90)
GLUCOSE: 99 mg/dL (ref 70–99)
Potassium: 3.9 mEq/L (ref 3.7–5.3)
Sodium: 143 mEq/L (ref 137–147)

## 2014-05-20 NOTE — Progress Notes (Signed)
EKG reviewed with Dr Al Corpus. No new orders, ok for surgery

## 2014-05-21 ENCOUNTER — Encounter (HOSPITAL_BASED_OUTPATIENT_CLINIC_OR_DEPARTMENT_OTHER)
Admission: RE | Disposition: A | Discharge status: Home / Self Care | Payer: Self-pay | Source: Ambulatory Visit | Attending: Orthopedic Surgery

## 2014-05-21 ENCOUNTER — Encounter (HOSPITAL_BASED_OUTPATIENT_CLINIC_OR_DEPARTMENT_OTHER): Payer: Self-pay | Admitting: *Deleted

## 2014-05-21 ENCOUNTER — Encounter (HOSPITAL_BASED_OUTPATIENT_CLINIC_OR_DEPARTMENT_OTHER): Payer: 59 | Admitting: Anesthesiology

## 2014-05-21 ENCOUNTER — Ambulatory Visit (HOSPITAL_BASED_OUTPATIENT_CLINIC_OR_DEPARTMENT_OTHER): Payer: 59 | Admitting: Anesthesiology

## 2014-05-21 ENCOUNTER — Ambulatory Visit (HOSPITAL_BASED_OUTPATIENT_CLINIC_OR_DEPARTMENT_OTHER)
Admission: RE | Admit: 2014-05-21 | Discharge: 2014-05-21 | Disposition: A | Discharge status: Home / Self Care | Payer: 59 | Source: Ambulatory Visit | Attending: Orthopedic Surgery | Admitting: Orthopedic Surgery

## 2014-05-21 DIAGNOSIS — I059 Rheumatic mitral valve disease, unspecified: Secondary | ICD-10-CM | POA: Insufficient documentation

## 2014-05-21 DIAGNOSIS — F411 Generalized anxiety disorder: Secondary | ICD-10-CM | POA: Insufficient documentation

## 2014-05-21 DIAGNOSIS — J309 Allergic rhinitis, unspecified: Secondary | ICD-10-CM | POA: Insufficient documentation

## 2014-05-21 DIAGNOSIS — E785 Hyperlipidemia, unspecified: Secondary | ICD-10-CM | POA: Insufficient documentation

## 2014-05-21 DIAGNOSIS — Z01812 Encounter for preprocedural laboratory examination: Secondary | ICD-10-CM | POA: Insufficient documentation

## 2014-05-21 DIAGNOSIS — M23329 Other meniscus derangements, posterior horn of medial meniscus, unspecified knee: Secondary | ICD-10-CM | POA: Insufficient documentation

## 2014-05-21 DIAGNOSIS — K649 Unspecified hemorrhoids: Secondary | ICD-10-CM | POA: Insufficient documentation

## 2014-05-21 DIAGNOSIS — I10 Essential (primary) hypertension: Secondary | ICD-10-CM | POA: Insufficient documentation

## 2014-05-21 DIAGNOSIS — M1710 Unilateral primary osteoarthritis, unspecified knee: Secondary | ICD-10-CM | POA: Insufficient documentation

## 2014-05-21 DIAGNOSIS — F3289 Other specified depressive episodes: Secondary | ICD-10-CM | POA: Insufficient documentation

## 2014-05-21 DIAGNOSIS — F329 Major depressive disorder, single episode, unspecified: Secondary | ICD-10-CM | POA: Insufficient documentation

## 2014-05-21 DIAGNOSIS — M2240 Chondromalacia patellae, unspecified knee: Secondary | ICD-10-CM | POA: Insufficient documentation

## 2014-05-21 DIAGNOSIS — Z01810 Encounter for preprocedural cardiovascular examination: Secondary | ICD-10-CM | POA: Insufficient documentation

## 2014-05-21 HISTORY — DX: Other specified postprocedural states: R11.2

## 2014-05-21 HISTORY — DX: Other specified postprocedural states: Z98.890

## 2014-05-21 HISTORY — PX: KNEE ARTHROSCOPY WITH MEDIAL MENISECTOMY: SHX5651

## 2014-05-21 LAB — POCT HEMOGLOBIN-HEMACUE: Hemoglobin: 13.2 g/dL (ref 12.0–15.0)

## 2014-05-21 SURGERY — ARTHROSCOPY, KNEE, WITH MEDIAL MENISCECTOMY
Anesthesia: General | Site: Knee | Laterality: Right

## 2014-05-21 MED ORDER — PROMETHAZINE HCL 25 MG/ML IJ SOLN
INTRAMUSCULAR | Status: AC
  Filled 2014-05-21: qty 1

## 2014-05-21 MED ORDER — SCOPOLAMINE 1 MG/3DAYS TD PT72
1.0000 | MEDICATED_PATCH | TRANSDERMAL | Status: DC
  Administered 2014-05-21: 1.5 mg via TRANSDERMAL

## 2014-05-21 MED ORDER — SODIUM CHLORIDE 0.9 % IJ SOLN
INTRAMUSCULAR | Status: AC
  Filled 2014-05-21: qty 10

## 2014-05-21 MED ORDER — PROPOFOL 10 MG/ML IV BOLUS
INTRAVENOUS | Status: DC | PRN
  Administered 2014-05-21: 50 mg via INTRAVENOUS
  Administered 2014-05-21: 200 mg via INTRAVENOUS

## 2014-05-21 MED ORDER — MIDAZOLAM HCL 5 MG/5ML IJ SOLN
INTRAMUSCULAR | Status: DC | PRN
  Administered 2014-05-21: 2 mg via INTRAVENOUS

## 2014-05-21 MED ORDER — BUPIVACAINE HCL (PF) 0.25 % IJ SOLN
INTRAMUSCULAR | Status: AC
  Filled 2014-05-21: qty 30

## 2014-05-21 MED ORDER — BUPIVACAINE HCL (PF) 0.5 % IJ SOLN
INTRAMUSCULAR | Status: AC
  Filled 2014-05-21: qty 30

## 2014-05-21 MED ORDER — PROMETHAZINE HCL 25 MG/ML IJ SOLN
6.2500 mg | INTRAMUSCULAR | Status: DC | PRN
  Administered 2014-05-21: 6.25 mg via INTRAVENOUS

## 2014-05-21 MED ORDER — OXYCODONE HCL 5 MG/5ML PO SOLN: 5.0000 mg | Freq: Once | ORAL | Status: DC | PRN

## 2014-05-21 MED ORDER — DOCUSATE SODIUM 100 MG PO CAPS: 100.0000 mg | ORAL_CAPSULE | Freq: Two times a day (BID) | ORAL | Status: DC

## 2014-05-21 MED ORDER — METHYLPREDNISOLONE ACETATE 80 MG/ML IJ SUSP
INTRAMUSCULAR | Status: DC | PRN
  Administered 2014-05-21: 16:00:00 via INTRA_ARTICULAR

## 2014-05-21 MED ORDER — SCOPOLAMINE 1 MG/3DAYS TD PT72
MEDICATED_PATCH | TRANSDERMAL | Status: AC
  Filled 2014-05-21: qty 1

## 2014-05-21 MED ORDER — DEXAMETHASONE SODIUM PHOSPHATE 4 MG/ML IJ SOLN
INTRAMUSCULAR | Status: DC | PRN
  Administered 2014-05-21: 10 mg via INTRAVENOUS

## 2014-05-21 MED ORDER — OXYCODONE HCL 5 MG PO TABS: 10.0000 mg | ORAL_TABLET | ORAL | Status: DC | PRN

## 2014-05-21 MED ORDER — LACTATED RINGERS IV SOLN
INTRAVENOUS | Status: DC
  Administered 2014-05-21 (×2): via INTRAVENOUS

## 2014-05-21 MED ORDER — ONDANSETRON HCL 4 MG/2ML IJ SOLN
INTRAMUSCULAR | Status: DC | PRN
  Administered 2014-05-21: 4 mg via INTRAVENOUS

## 2014-05-21 MED ORDER — MIDAZOLAM HCL 2 MG/2ML IJ SOLN: 1.0000 mg | INTRAMUSCULAR | Status: DC | PRN

## 2014-05-21 MED ORDER — ASPIRIN 81 MG PO TABS: 81.0000 mg | ORAL_TABLET | Freq: Every day | ORAL | Status: DC

## 2014-05-21 MED ORDER — FENTANYL CITRATE 0.05 MG/ML IJ SOLN
INTRAMUSCULAR | Status: AC
  Filled 2014-05-21: qty 6

## 2014-05-21 MED ORDER — HYDROMORPHONE HCL PF 1 MG/ML IJ SOLN: 0.2500 mg | INTRAMUSCULAR | Status: DC | PRN

## 2014-05-21 MED ORDER — GLYCOPYRROLATE 0.2 MG/ML IJ SOLN
INTRAMUSCULAR | Status: DC | PRN
  Administered 2014-05-21: 0.2 mg via INTRAVENOUS

## 2014-05-21 MED ORDER — OXYCODONE HCL 5 MG PO TABS: 5.0000 mg | ORAL_TABLET | Freq: Once | ORAL | Status: DC | PRN

## 2014-05-21 MED ORDER — ONDANSETRON HCL 4 MG PO TABS: 4.0000 mg | ORAL_TABLET | Freq: Three times a day (TID) | ORAL | Status: DC | PRN

## 2014-05-21 MED ORDER — LIDOCAINE HCL (CARDIAC) 20 MG/ML IV SOLN
INTRAVENOUS | Status: DC | PRN
  Administered 2014-05-21: 50 mg via INTRAVENOUS

## 2014-05-21 MED ORDER — CEFAZOLIN SODIUM-DEXTROSE 2-3 GM-% IV SOLR
INTRAVENOUS | Status: DC | PRN
  Administered 2014-05-21: 2 g via INTRAVENOUS

## 2014-05-21 MED ORDER — FENTANYL CITRATE 0.05 MG/ML IJ SOLN
INTRAMUSCULAR | Status: DC | PRN
  Administered 2014-05-21 (×4): 50 ug via INTRAVENOUS

## 2014-05-21 MED ORDER — FENTANYL CITRATE 0.05 MG/ML IJ SOLN: 50.0000 ug | INTRAMUSCULAR | Status: DC | PRN

## 2014-05-21 MED ORDER — SODIUM CHLORIDE 0.9 % IR SOLN
Status: DC | PRN
  Administered 2014-05-21: 1

## 2014-05-21 MED ORDER — MIDAZOLAM HCL 2 MG/2ML IJ SOLN
INTRAMUSCULAR | Status: AC
  Filled 2014-05-21: qty 2

## 2014-05-21 SURGICAL SUPPLY — 41 items
BANDAGE ELASTIC 6 VELCRO ST LF (GAUZE/BANDAGES/DRESSINGS) ×3 IMPLANT
BLADE CUDA 5.5 (BLADE) IMPLANT
BLADE CUDA GRT WHITE 3.5 (BLADE) IMPLANT
BLADE CUTTER GATOR 3.5 (BLADE) ×3 IMPLANT
BLADE CUTTER MENIS 5.5 (BLADE) IMPLANT
CANISTER SUCT 3000ML (MISCELLANEOUS) IMPLANT
CHLORAPREP W/TINT 26ML (MISCELLANEOUS) ×3 IMPLANT
CUTTER MENISCUS  4.2MM (BLADE)
CUTTER MENISCUS 4.2MM (BLADE) IMPLANT
DRAPE ARTHROSCOPY W/POUCH 90 (DRAPES) ×3 IMPLANT
DRAPE U 20/CS (DRAPES) ×3 IMPLANT
DRSG EMULSION OIL 3X3 NADH (GAUZE/BANDAGES/DRESSINGS) ×3 IMPLANT
GAUZE SPONGE 4X4 12PLY STRL (GAUZE/BANDAGES/DRESSINGS) ×6 IMPLANT
GLOVE BIO SURGEON STRL SZ7.5 (GLOVE) ×6 IMPLANT
GLOVE BIO SURGEON STRL SZ8 (GLOVE) IMPLANT
GLOVE BIOGEL PI IND STRL 7.0 (GLOVE) IMPLANT
GLOVE BIOGEL PI IND STRL 7.5 (GLOVE) IMPLANT
GLOVE BIOGEL PI IND STRL 8 (GLOVE) IMPLANT
GLOVE BIOGEL PI IND STRL 8.5 (GLOVE) IMPLANT
GLOVE BIOGEL PI INDICATOR 7.0 (GLOVE) ×2
GLOVE BIOGEL PI INDICATOR 7.5 (GLOVE) ×2
GLOVE BIOGEL PI INDICATOR 8 (GLOVE)
GLOVE BIOGEL PI INDICATOR 8.5 (GLOVE)
GLOVE SURG SS PI 7.0 STRL IVOR (GLOVE) ×2 IMPLANT
GOWN STRL REUS W/ TWL LRG LVL3 (GOWN DISPOSABLE) ×3 IMPLANT
GOWN STRL REUS W/ TWL XL LVL3 (GOWN DISPOSABLE) ×1 IMPLANT
GOWN STRL REUS W/TWL LRG LVL3 (GOWN DISPOSABLE) ×9
GOWN STRL REUS W/TWL XL LVL3 (GOWN DISPOSABLE) ×3
IMMOBILIZER KNEE 22 UNIV (SOFTGOODS) ×2 IMPLANT
KNEE WRAP E Z 3 GEL PACK (MISCELLANEOUS) IMPLANT
MANIFOLD NEPTUNE II (INSTRUMENTS) ×3 IMPLANT
PACK ARTHROSCOPY DSU (CUSTOM PROCEDURE TRAY) ×3 IMPLANT
PACK BASIN DAY SURGERY FS (CUSTOM PROCEDURE TRAY) ×3 IMPLANT
SET ARTHROSCOPY TUBING (MISCELLANEOUS) ×3
SET ARTHROSCOPY TUBING LN (MISCELLANEOUS) ×1 IMPLANT
SUT ETHILON 3 0 PS 1 (SUTURE) ×3 IMPLANT
TAPE CLOTH 3X10 TAN LF (GAUZE/BANDAGES/DRESSINGS) IMPLANT
TOWEL OR 17X24 6PK STRL BLUE (TOWEL DISPOSABLE) ×3 IMPLANT
TOWEL OR NON WOVEN STRL DISP B (DISPOSABLE) ×3 IMPLANT
WAND STAR VAC 90 (SURGICAL WAND) IMPLANT
WATER STERILE IRR 1000ML POUR (IV SOLUTION) ×3 IMPLANT

## 2014-05-21 NOTE — Transfer of Care (Signed)
Immediate Anesthesia Transfer of Care Note  Patient: Jane Mooney  Procedure(s) Performed: Procedure(s): RIGHT KNEE ARTHROSCOPY WITH DEBRIDEMENT/SHAVING, MEDIAL MENISECTOMY (Right)  Patient Location: PACU  Anesthesia Type:General  Level of Consciousness: awake, alert  and oriented  Airway & Oxygen Therapy: Patient Spontanous Breathing and Patient connected to face mask oxygen  Post-op Assessment: Report given to PACU RN, Post -op Vital signs reviewed and stable and Patient moving all extremities  Post vital signs: Reviewed and stable  Complications: No apparent anesthesia complications

## 2014-05-21 NOTE — Interval H&P Note (Signed)
History and Physical Interval Note:  05/21/2014 1:50 PM  Jane Mooney  has presented today for surgery, with the diagnosis of Right Knee: Chondromalcia Patella, Derangement Medial Meniscus, Post Horn, Knee  The various methods of treatment have been discussed with the patient and family. After consideration of risks, benefits and other options for treatment, the patient has consented to  Procedure(s): RIGHT KNEE ARTHROSCOPY WITH DEBRIDEMENT/SHAVING, MEDIAL MENISECTOMY (Right) as a surgical intervention .  The patient's history has been reviewed, patient examined, no change in status, stable for surgery.  I have reviewed the patient's chart and labs.  Questions were answered to the patient's satisfaction.     [name], D

## 2014-05-21 NOTE — Anesthesia Postprocedure Evaluation (Signed)
Anesthesia Post Note  Patient: Jane Mooney  Procedure(s) Performed: Procedure(s) (LRB): RIGHT KNEE ARTHROSCOPY WITH DEBRIDEMENT/SHAVING, MEDIAL MENISECTOMY (Right)  Anesthesia type: general  Patient location: PACU  Post pain: Pain level controlled  Post assessment: Patient's Cardiovascular Status Stable  Last Vitals:  Filed Vitals:   05/21/14 1700  BP: 145/87  Pulse: 60  Temp:   Resp: 19    Post vital signs: Reviewed and stable  Level of consciousness: sedated  Complications: No apparent anesthesia complications

## 2014-05-21 NOTE — Discharge Instructions (Signed)
Call Dr. Debroah Loop office on Monday morning to find out if/when you may remove dressing and if/when you may get the wound wet.  Bear weight as tolerated with knee immobilizer on  OK to work on ROM with no weight on leg   Post Anesthesia Home Care Instructions  Activity: Get plenty of rest for the remainder of the day. A responsible adult should stay with you for 24 hours following the procedure.  For the next 24 hours, DO NOT: -Drive a car -Paediatric nurse -Drink alcoholic beverages -Take any medication unless instructed by your physician -Make any legal decisions or sign important papers.  Meals: Start with liquid foods such as gelatin or soup. Progress to regular foods as tolerated. Avoid greasy, spicy, heavy foods. If nausea and/or vomiting occur, drink only clear liquids until the nausea and/or vomiting subsides. Call your physician if vomiting continues.  Special Instructions/Symptoms: Your throat may feel dry or sore from the anesthesia or the breathing tube placed in your throat during surgery. If this causes discomfort, gargle with warm salt water. The discomfort should disappear within 24 hours.

## 2014-05-21 NOTE — Op Note (Signed)
05/21/2014  4:00 PM  PATIENT:  Jane Mooney    PRE-OPERATIVE DIAGNOSIS:  Right Knee: Chondromalcia Patella, Derangement Medial Meniscus, Post Horn, Knee  POST-OPERATIVE DIAGNOSIS:  Same  PROCEDURE:  RIGHT KNEE ARTHROSCOPY WITH DEBRIDEMENT/SHAVING, MEDIAL MENISECTOMY  SURGEON:  Devone Tousley, D, MD  ASSISTANT: Toshi Sakamato OPA  ANESTHESIA:   General  BLOOD LOSS: min  COMPLICATIONS: None   PREOPERATIVE INDICATIONS:  Jane Mooney is a  63 y.o. female with a diagnosis of Right Knee: Chondromalcia Patella, Derangement Medial Meniscus, Post Horn, Knee who failed conservative measures and elected for surgical management.    The risks benefits and alternatives were discussed with the patient preoperatively including but not limited to the risks of infection, bleeding, nerve injury, cardiopulmonary complications, the need for revision surgery, among others, and the patient was willing to proceed.  OPERATIVE IMPLANTS: none  OPERATIVE FINDINGS: Examination under anesthesia: stable knee Diagnostic Arthroscopy:  articular cartilage:Medial femoral chondyl lesion engages at 45degrees, 1x0.5cm, full thickness Medial meniscus:radial tear Lateral meniscus:stable Anterior cruciate ligament/PCL: stable Loose bodies: none    OPERATIVE PROCEDURE:  Patient was identified in the preoperative holding area and site was marked by me female was transported to the operating theater and placed on the table in supine position taking care to pad all bony prominences. After a preincinduction time out anesthesia was induced.  female received ancef for preoperative antibiotics. The right lower extremity was prepped and draped in normal sterile fashion and a pre-incision timeout was performed.   A small stab incision was made in the anterolateral portal position. The arthroscope was introduced in the joint. A medial portal was then established under direct visualization just above the anterior horn of the  medial meniscus. Diagnostic arthroscopy was then carried out with findings as described above.  I performed a partial medial meniscectomy back to a stable rim. I performed a chondroplasty on the lateral tibial plateau. I then performed a debridement and microfracture of the femoral condyle lesion.  The arthroscopic equipment was removed from the joint and the portals were closed with 3-0 nylon in an interrupted fashion. The knee was infiltrated with depomedrol 80mg .  Sterile dressings were then applied including Xeroform 4 x 4's ABDs an ACE bandage.  The patient was then allowed to awaken from general anesthesia, transferred to the stretcher and taken to the recovery room in stable condition.  POSTOPERATIVE PLAN: The patient will be discharged home today and will followup in one week for suture removal and wound check.  VTE prophylaxis: ambulation and ASA 81daily for 30 days

## 2014-05-21 NOTE — Anesthesia Procedure Notes (Signed)
Procedure Name: LMA Insertion Date/Time: 05/21/2014 2:53 PM Performed by: Toula Moos L Pre-anesthesia Checklist: Patient identified, Emergency Drugs available, Suction available, Patient being monitored and Timeout performed Patient Re-evaluated:Patient Re-evaluated prior to inductionOxygen Delivery Method: Circle System Utilized Preoxygenation: Pre-oxygenation with 100% oxygen Intubation Type: IV induction Ventilation: Mask ventilation without difficulty LMA: LMA inserted LMA Size: 4.0 Number of attempts: 1 Airway Equipment and Method: bite block Placement Confirmation: positive ETCO2 and breath sounds checked- equal and bilateral Tube secured with: Tape Dental Injury: Teeth and Oropharynx as per pre-operative assessment

## 2014-05-21 NOTE — Anesthesia Preprocedure Evaluation (Signed)
Anesthesia Evaluation  Patient identified by MRN, date of birth, ID band Patient awake    Reviewed: Allergy & Precautions, H&P , NPO status , Patient's Chart, lab work & pertinent test results  History of Anesthesia Complications (+) PONV and history of anesthetic complications  Airway Mallampati: II TM Distance: >3 FB Neck ROM: full    Dental  (+) Dental Advidsory Given, Partial Upper   Pulmonary neg pulmonary ROS,  breath sounds clear to auscultation        Cardiovascular hypertension, negative cardio ROS  Rhythm:regular Rate:Normal     Neuro/Psych negative neurological ROS  negative psych ROS   GI/Hepatic negative GI ROS, Neg liver ROS,   Endo/Other  negative endocrine ROS  Renal/GU negative Renal ROS     Musculoskeletal   Abdominal   Peds  Hematology   Anesthesia Other Findings   Reproductive/Obstetrics negative OB ROS                           Anesthesia Physical Anesthesia Plan  ASA: II  Anesthesia Plan: General LMA   Post-op Pain Management:    Induction:   Airway Management Planned:   Additional Equipment:   Intra-op Plan:   Post-operative Plan:   Informed Consent: I have reviewed the patients History and Physical, chart, labs and discussed the procedure including the risks, benefits and alternatives for the proposed anesthesia with the patient or authorized representative who has indicated his/her understanding and acceptance.   Dental Advisory Given  Plan Discussed with: Anesthesiologist, CRNA and Surgeon  Anesthesia Plan Comments:         Anesthesia Quick Evaluation

## 2014-05-21 NOTE — H&P (Signed)
ORTHOPAEDIC CONSULTATION  REQUESTING PHYSICIAN: Renette Butters, MD  Chief Complaint: R knee medial meniscal tear  HPI: Jane Mooney is a 63 y.o. female who complains of  R knee mechanical symptoms  Past Medical History  Diagnosis Date  . Anxiety 2010  . Depression   . HTN (hypertension)   . DJD (degenerative joint disease)     Left knee  . MVP (mitral valve prolapse)   . Symptomatic PVCs   . Allergic rhinitis   . LBP (low back pain)   . Hyperlipidemia   . Hemorrhoid   . Anemia   . Heart murmur   . Urticaria     recurrent  . PONV (postoperative nausea and vomiting)    Past Surgical History  Procedure Laterality Date  . Knee surgery  03/2007    Left Knee Arthr- Dr Onnie Graham  . Rotator cuff repair  2004    Left   . Abdominal hysterectomy  1984  . Tonsillectomy    . Hemorrhoid surgery    . Urtica     History   Social History  . Marital Status: Married    Spouse Name: N/A    Number of Children: 2  . Years of Education: N/A   Occupational History  . 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 Smoking status: Never Smoker   . Smokeless tobacco: Never Used  . Alcohol Use: Yes     Comment: social  . Drug Use: No  . Sexual Activity: Yes   Other Topics Concern  . None   Social History Narrative  . None   Family History  Problem Relation Age of Onset  . Diabetes Other   . Hypertension Other   . Breast cancer Other   . Cancer Other     breast   . Hypertension Mother   . Colon cancer Neg Hx   . Esophageal cancer Neg Hx   . Stomach cancer Neg Hx   . Rectal cancer Neg Hx    Allergies  Allergen Reactions  . Citalopram     weak  . Mirtazapine     Made pt very groggy  . Quinapril Hcl     REACTION: cough   Prior to Admission medications   Medication Sig Start Date End Date Taking? Authorizing Provider  amLODipine-olmesartan (AZOR) 10-40 MG per tablet TAKE 1 TABLET DAILY 07/10/13  Yes Aleksei Plotnikov V, MD    Cholecalciferol (VITAMIN D3) 1000 UNITS tablet Take 1,000 Units by mouth daily.     Yes Historical Provider, MD  Cyanocobalamin (VITAMIN B-12) 1000 MCG SUBL Place 1 tablet (1,000 mcg total) under the tongue daily. 08/03/11  Yes Aleksei Plotnikov V, MD  ferrous sulfate (FERRO-BOB) 325 (65 FE) MG tablet Take 325 mg by mouth daily. For anemia    Yes Historical Provider, MD  fish oil-omega-3 fatty acids 1000 MG capsule Take 1 g by mouth daily.     Yes Historical Provider, MD  HYDROcodone-acetaminophen (VICODIN) 2.5-500 MG per tablet Take 1 tablet by mouth every 4 (four) hours as needed for pain. 05/06/14  Yes Hendricks Limes, MD  hydrOXYzine (ATARAX/VISTARIL) 25 MG tablet Take 1 tablet (25 mg total) by mouth every 8 (eight) hours as needed. 01/18/14  Yes Aleksei Plotnikov V, MD  ibuprofen (ADVIL,MOTRIN) 600 MG tablet Take 1 tablet (600 mg total) by mouth 2 (two) times daily as needed. For pain 10/14/13  Yes Aleksei Plotnikov V,  MD  loratadine (CLARITIN) 10 MG tablet Take 1 tablet (10 mg total) by mouth daily. 01/18/14  Yes Aleksei Plotnikov V, MD  LORazepam (ATIVAN) 1 MG tablet TAKE 1 TABLET BY MOUTH 2 TIMES A DAY AS NEEDED FOR ANXIETY 04/16/14  Yes Aleksei Plotnikov V, MD  potassium chloride (KLOR-CON M10) 10 MEQ tablet TAKE 1 TABLET DAILY 10/14/13  Yes Aleksei Plotnikov V, MD  triamterene-hydrochlorothiazide (MAXZIDE-25) 37.5-25 MG per tablet Take 1 tablet by mouth daily. 07/10/13  Yes Aleksei Plotnikov V, MD  hydrocortisone (ANUSOL-HC) 2.5 % rectal cream Apply rectally 2 times daily 05/03/11 04/16/14  Cassandria Anger, MD   No results found.  Positive ROS: All other systems have been reviewed and were otherwise negative with the exception of those mentioned in the HPI and as above.  Labs cbc  Recent Labs  05/21/14 1242  HGB 13.2    Labs inflam No results found for this basename: ESR, CRP,  in the last 72 hours  Labs coag No results found for this basename: INR, PT, PTT,  in the last 72  hours   Recent Labs  05/20/14 1145  NA 143  K 3.9  CL 102  CO2 27  GLUCOSE 99  BUN 15  CREATININE 0.78  CALCIUM 9.6    Physical Exam: Filed Vitals:   05/21/14 1223  BP: 133/87  Pulse: 67  Temp: 98.7 F (37.1 C)  Resp: 16   General: Alert, no acute distress Cardiovascular: No pedal edema Respiratory: No cyanosis, no use of accessory musculature GI: No organomegaly, abdomen is soft and non-tender Skin: No lesions in the area of chief complaint Neurologic: Sensation intact distally Psychiatric: Patient is competent for consent with normal mood and affect Lymphatic: No axillary or cervical lymphadenopathy  MUSCULOSKELETAL:  Mechanical catching Other extremities are atraumatic with painless ROM and NVI.  Assessment: R knee meniscal tear  Plan: R knee scope, medial meniscectomy    Edmonia Lynch, D, MD Cell [phone]   05/21/2014 1:49 PM

## 2014-05-24 ENCOUNTER — Encounter (HOSPITAL_BASED_OUTPATIENT_CLINIC_OR_DEPARTMENT_OTHER): Payer: Self-pay | Admitting: Orthopedic Surgery

## 2014-07-28 ENCOUNTER — Encounter: Payer: Self-pay | Admitting: Internal Medicine

## 2014-07-28 ENCOUNTER — Ambulatory Visit (INDEPENDENT_AMBULATORY_CARE_PROVIDER_SITE_OTHER): Payer: Medicare Other | Admitting: Internal Medicine

## 2014-07-28 ENCOUNTER — Other Ambulatory Visit (INDEPENDENT_AMBULATORY_CARE_PROVIDER_SITE_OTHER): Payer: Medicare Other

## 2014-07-28 VITALS — BP 138/80 | HR 86 | Temp 98.7°F | Wt 186.0 lb

## 2014-07-28 DIAGNOSIS — R7309 Other abnormal glucose: Secondary | ICD-10-CM

## 2014-07-28 DIAGNOSIS — Z23 Encounter for immunization: Secondary | ICD-10-CM

## 2014-07-28 DIAGNOSIS — F329 Major depressive disorder, single episode, unspecified: Secondary | ICD-10-CM

## 2014-07-28 DIAGNOSIS — F3289 Other specified depressive episodes: Secondary | ICD-10-CM

## 2014-07-28 DIAGNOSIS — R739 Hyperglycemia, unspecified: Secondary | ICD-10-CM

## 2014-07-28 DIAGNOSIS — I10 Essential (primary) hypertension: Secondary | ICD-10-CM

## 2014-07-28 DIAGNOSIS — M25561 Pain in right knee: Secondary | ICD-10-CM

## 2014-07-28 DIAGNOSIS — M25569 Pain in unspecified knee: Secondary | ICD-10-CM

## 2014-07-28 DIAGNOSIS — E538 Deficiency of other specified B group vitamins: Secondary | ICD-10-CM

## 2014-07-28 LAB — CBC WITH DIFFERENTIAL/PLATELET
BASOS ABS: 0.1 10*3/uL (ref 0.0–0.1)
Basophils Relative: 1.1 % (ref 0.0–3.0)
EOS ABS: 0.2 10*3/uL (ref 0.0–0.7)
Eosinophils Relative: 3 % (ref 0.0–5.0)
HCT: 38.6 % (ref 36.0–46.0)
Hemoglobin: 12.5 g/dL (ref 12.0–15.0)
Lymphocytes Relative: 36.6 % (ref 12.0–46.0)
Lymphs Abs: 2.1 10*3/uL (ref 0.7–4.0)
MCHC: 32.4 g/dL (ref 30.0–36.0)
MCV: 80 fl (ref 78.0–100.0)
Monocytes Absolute: 0.7 10*3/uL (ref 0.1–1.0)
Monocytes Relative: 11.5 % (ref 3.0–12.0)
NEUTROS ABS: 2.7 10*3/uL (ref 1.4–7.7)
NEUTROS PCT: 47.8 % (ref 43.0–77.0)
Platelets: 241 10*3/uL (ref 150.0–400.0)
RBC: 4.82 Mil/uL (ref 3.87–5.11)
RDW: 14.8 % (ref 11.5–15.5)
WBC: 5.7 10*3/uL (ref 4.0–10.5)

## 2014-07-28 LAB — HEPATIC FUNCTION PANEL
ALT: 13 U/L (ref 0–35)
AST: 16 U/L (ref 0–37)
Albumin: 4.4 g/dL (ref 3.5–5.2)
Alkaline Phosphatase: 79 U/L (ref 39–117)
Bilirubin, Direct: 0.1 mg/dL (ref 0.0–0.3)
TOTAL PROTEIN: 8 g/dL (ref 6.0–8.3)
Total Bilirubin: 0.6 mg/dL (ref 0.2–1.2)

## 2014-07-28 LAB — IBC PANEL
Iron: 69 ug/dL (ref 42–145)
SATURATION RATIOS: 18.1 % — AB (ref 20.0–50.0)
TRANSFERRIN: 272.6 mg/dL (ref 212.0–360.0)

## 2014-07-28 LAB — HEMOGLOBIN A1C: Hgb A1c MFr Bld: 5.8 % (ref 4.6–6.5)

## 2014-07-28 LAB — BASIC METABOLIC PANEL
BUN: 10 mg/dL (ref 6–23)
CALCIUM: 9.7 mg/dL (ref 8.4–10.5)
CO2: 31 mEq/L (ref 19–32)
Chloride: 102 mEq/L (ref 96–112)
Creatinine, Ser: 0.9 mg/dL (ref 0.4–1.2)
GFR: 76.85 mL/min (ref >60.00)
GLUCOSE: 109 mg/dL — AB (ref 70–99)
Potassium: 3.9 mEq/L (ref 3.5–5.1)
Sodium: 139 mEq/L (ref 135–145)

## 2014-07-28 MED ORDER — TRIAMTERENE-HCTZ 37.5-25 MG PO TABS: 1.0000 | ORAL_TABLET | Freq: Every day | ORAL | Status: DC

## 2014-07-28 MED ORDER — AMLODIPINE-OLMESARTAN 10-40 MG PO TABS: ORAL_TABLET | ORAL | Status: DC

## 2014-07-28 NOTE — Assessment & Plan Note (Signed)
R knee - s/p arthroscopic surgery

## 2014-07-28 NOTE — Progress Notes (Signed)
   Subjective:     HPI    The patient is here to follow up on elev glu, chronic depression, anxiety, headaches and chronic moderate OA/LBP symptoms controlled with medicines, diet and exercise. C/o R knee pain - post-op (05/21/14)   Wt Readings from Last 3 Encounters:  07/28/14 186 lb (84.369 kg)  05/21/14 181 lb 6 oz (82.271 kg)  05/21/14 181 lb 6 oz (82.271 kg)   BP Readings from Last 3 Encounters:  07/28/14 138/80  05/21/14 146/71  05/21/14 146/71     Review of Systems  Constitutional: Negative for chills, activity change, appetite change, fatigue and unexpected weight change.  HENT: Negative for congestion, mouth sores and sinus pressure.   Eyes: Negative for visual disturbance.  Respiratory: Negative for cough and chest tightness.   Gastrointestinal: Negative for nausea and abdominal pain.  Genitourinary: Negative for frequency, difficulty urinating and vaginal pain.  Musculoskeletal: Negative for back pain and gait problem.  Skin: Negative for pallor and rash.  Neurological: Negative for dizziness, tremors, weakness, numbness and headaches.  Psychiatric/Behavioral: Negative for confusion and sleep disturbance. The patient is not nervous/anxious.        Objective:   Physical Exam  Constitutional: She appears well-developed. No distress.  obese  HENT:  Head: Normocephalic.  Right Ear: External ear normal.  Left Ear: External ear normal.  Nose: Nose normal.  Mouth/Throat: Oropharynx is clear and moist.  Eyes: Conjunctivae are normal. Pupils are equal, round, and reactive to light. Right eye exhibits no discharge. Left eye exhibits no discharge.  Neck: Normal range of motion. Neck supple. No JVD present. No tracheal deviation present. No thyromegaly present.  Cardiovascular: Normal rate, regular rhythm and normal heart sounds.   Pulmonary/Chest: No stridor. No respiratory distress. She has no wheezes.  Abdominal: Soft. Bowel sounds are normal. She exhibits no  distension and no mass. There is no tenderness. There is no rebound and no guarding.  Musculoskeletal: She exhibits tenderness (LS is tender w/ROM). She exhibits no edema.  Lymphadenopathy:    She has no cervical adenopathy.  Neurological: She displays normal reflexes. No cranial nerve deficit. She exhibits normal muscle tone. Coordination normal.  Skin: No rash noted. No erythema.  Psychiatric: She has a normal mood and affect. Her behavior is normal. Judgment and thought content normal.  R knee in a brace Cane    Lab Results  Component Value Date   WBC 5.7 07/06/2013   HGB 13.2 05/21/2014   HCT 39.9 07/06/2013   PLT 233.0 07/06/2013   GLUCOSE 99 05/20/2014   CHOL 222* 07/06/2013   TRIG 160.0* 07/06/2013   HDL 48.20 07/06/2013   LDLDIRECT 153.6 07/06/2013   ALT 14 07/06/2013   AST 18 07/06/2013   NA 143 05/20/2014   K 3.9 05/20/2014   CL 102 05/20/2014   CREATININE 0.78 05/20/2014   BUN 15 05/20/2014   CO2 27 05/20/2014   TSH 0.79 07/06/2013   HGBA1C 5.9 10/31/2011            Assessment & Plan:

## 2014-07-28 NOTE — Assessment & Plan Note (Signed)
Continue with current prescription therapy as reflected on the Med list.  

## 2014-07-28 NOTE — Assessment & Plan Note (Signed)
Doing better.   

## 2014-07-28 NOTE — Progress Notes (Signed)
Pre visit review using our clinic review tool, if applicable. No additional management support is needed unless otherwise documented below in the visit note. 

## 2014-09-16 ENCOUNTER — Other Ambulatory Visit (HOSPITAL_COMMUNITY): Payer: Self-pay | Admitting: Gynecology

## 2014-09-16 DIAGNOSIS — Z1231 Encounter for screening mammogram for malignant neoplasm of breast: Secondary | ICD-10-CM

## 2014-09-18 ENCOUNTER — Emergency Department (HOSPITAL_COMMUNITY)
Admission: EM | Admit: 2014-09-18 | Discharge: 2014-09-18 | Disposition: A | Discharge status: Home / Self Care | Payer: Medicare Other | Source: Home / Self Care | Attending: Family Medicine | Admitting: Family Medicine

## 2014-09-18 ENCOUNTER — Encounter (HOSPITAL_COMMUNITY): Payer: Self-pay | Admitting: Emergency Medicine

## 2014-09-18 DIAGNOSIS — J329 Chronic sinusitis, unspecified: Secondary | ICD-10-CM

## 2014-09-18 DIAGNOSIS — B349 Viral infection, unspecified: Secondary | ICD-10-CM

## 2014-09-18 DIAGNOSIS — B9789 Other viral agents as the cause of diseases classified elsewhere: Secondary | ICD-10-CM

## 2014-09-18 MED ORDER — PREDNISONE 10 MG PO TABS: ORAL_TABLET | ORAL | Status: DC

## 2014-09-18 MED ORDER — AMOXICILLIN-POT CLAVULANATE 875-125 MG PO TABS: 1.0000 | ORAL_TABLET | Freq: Two times a day (BID) | ORAL | Status: DC

## 2014-09-18 MED ORDER — FLUTICASONE PROPIONATE 50 MCG/ACT NA SUSP: 2.0000 | Freq: Two times a day (BID) | NASAL | Status: DC

## 2014-09-18 NOTE — ED Notes (Signed)
C/o cold sx onset 4 days Sx include: HA, productive cough, congestion Denies f/v/n/d, SOB, wheezing Taking OTC cold meds w/no relief Alert, no signs of acute distress.

## 2014-09-18 NOTE — ED Provider Notes (Signed)
CSN: [id number]     Arrival date & time 09/18/14  1015 History   First MD Initiated Contact with Patient 09/18/14 1057     Chief Complaint  Patient presents with  . URI   (Consider location/radiation/quality/duration/timing/severity/associated sxs/prior Treatment) HPI       63 year old female presents complaining of headache, sinus pressure, cough, congestion. Her symptoms began 4 days ago. This started with a sore throat and slight headache and has progressed since that time. She is having some chest pain with coughing as well. No fever, chills, NVD. No pleuritic chest pain. Her husband was sick with similar symptoms last week. No recent travel  Past Medical History  Diagnosis Date  . Anxiety 2010  . Depression   . HTN (hypertension)   . DJD (degenerative joint disease)     Left knee  . MVP (mitral valve prolapse)   . Symptomatic PVCs   . Allergic rhinitis   . LBP (low back pain)   . Hyperlipidemia   . Hemorrhoid   . Anemia   . Heart murmur   . Urticaria     recurrent  . PONV (postoperative nausea and vomiting)    Past Surgical History  Procedure Laterality Date  . Knee surgery  03/2007    Left Knee Arthr- Dr Onnie Graham  . Rotator cuff repair  2004    Left   . Abdominal hysterectomy  1984  . Tonsillectomy    . Hemorrhoid surgery    . Urtica    . Knee arthroscopy with medial menisectomy Right 05/21/2014    Procedure: RIGHT KNEE ARTHROSCOPY WITH DEBRIDEMENT/SHAVING, MEDIAL MENISECTOMY;  Surgeon: Renette Butters, MD;  Location: [facility];  Service: Orthopedics;  Laterality: Right;   Family History  Problem Relation Age of Onset  . Diabetes Other   . Hypertension Other   . Breast cancer Other   . Cancer Other     breast   . Hypertension Mother   . Colon cancer Neg Hx   . Esophageal cancer Neg Hx   . Stomach cancer Neg Hx   . Rectal cancer Neg Hx    History  Substance Use Topics  . Smoking status: Never Smoker   . Smokeless tobacco: Never Used  .  Alcohol Use: Yes     Comment: social   OB History    No data available     Review of Systems  Constitutional: Negative for fever, chills and fatigue.  HENT: Positive for congestion, postnasal drip, rhinorrhea, sinus pressure and sore throat. Negative for ear pain.   Respiratory: Positive for cough. Negative for chest tightness, shortness of breath and wheezing.   Cardiovascular: Positive for chest pain (with coughing only).  Gastrointestinal: Negative for nausea, vomiting, abdominal pain and diarrhea.  Musculoskeletal: Negative for myalgias and arthralgias.  All other systems reviewed and are negative.   Allergies  Citalopram; Mirtazapine; and Quinapril hcl  Home Medications   Prior to Admission medications   Medication Sig Start Date End Date Taking? Authorizing Provider  triamterene-hydrochlorothiazide (MAXZIDE-25) 37.5-25 MG per tablet Take 1 tablet by mouth daily. 07/28/14  Yes Aleksei Plotnikov V, MD  amLODipine-olmesartan (AZOR) 10-40 MG per tablet TAKE 1 TABLET DAILY 07/28/14   Lew Dawes V, MD  amoxicillin-clavulanate (AUGMENTIN) 875-125 MG per tablet Take 1 tablet by mouth every 12 (twelve) hours. 09/18/14   Liam Graham, PA-C  aspirin 81 MG tablet Take 1 tablet (81 mg total) by mouth daily. 05/21/14   Renette Butters, MD  Cholecalciferol (VITAMIN D3) 1000 UNITS tablet Take 1,000 Units by mouth daily.      Historical Provider, MD  Cyanocobalamin (VITAMIN B-12) 1000 MCG SUBL Place 1 tablet (1,000 mcg total) under the tongue daily. 08/03/11   Aleksei Plotnikov V, MD  docusate sodium (COLACE) 100 MG capsule Take 1 capsule (100 mg total) by mouth 2 (two) times daily. 05/21/14   Renette Butters, MD  ferrous sulfate (FERRO-BOB) 325 (65 FE) MG tablet Take 325 mg by mouth daily. For anemia     Historical Provider, MD  fish oil-omega-3 fatty acids 1000 MG capsule Take 1 g by mouth daily.      Historical Provider, MD  fluticasone (FLONASE) 50 MCG/ACT nasal spray Place 2 sprays  into both nostrils 2 (two) times daily. Decrease to 2 sprays/nostril daily after 5 days 09/18/14   Liam Graham, PA-C  hydrocortisone (ANUSOL-HC) 2.5 % rectal cream Apply rectally 2 times daily 05/03/11 04/16/14  Cassandria Anger, MD  hydrOXYzine (ATARAX/VISTARIL) 25 MG tablet Take 1 tablet (25 mg total) by mouth every 8 (eight) hours as needed. 01/18/14   Aleksei Plotnikov V, MD  loratadine (CLARITIN) 10 MG tablet Take 1 tablet (10 mg total) by mouth daily. 01/18/14   Aleksei Plotnikov V, MD  LORazepam (ATIVAN) 1 MG tablet TAKE 1 TABLET BY MOUTH 2 TIMES A DAY AS NEEDED FOR ANXIETY 04/16/14   Aleksei Plotnikov V, MD  meloxicam (MOBIC) 15 MG tablet Take 15 mg by mouth daily.    Historical Provider, MD  ondansetron (ZOFRAN) 4 MG tablet Take 1 tablet (4 mg total) by mouth every 8 (eight) hours as needed for nausea or vomiting. 05/21/14   Renette Butters, MD  potassium chloride (KLOR-CON M10) 10 MEQ tablet TAKE 1 TABLET DAILY 10/14/13   Aleksei Plotnikov V, MD  predniSONE (DELTASONE) 10 MG tablet 4 tabs PO QD for 4 days; 3 tabs PO QD for 3 days; 2 tabs PO QD for 2 days; 1 tab PO QD for 1 day 09/18/14   Liam Graham, PA-C   BP 175/86 mmHg  Pulse 60  Temp(Src) 99.1 F (37.3 C) (Oral)  Resp 18  SpO2 100% Physical Exam  Constitutional: She is oriented to person, place, and time. Vital signs are normal. She appears well-developed and well-nourished. No distress.  HENT:  Head: Normocephalic and atraumatic.  Right Ear: Tympanic membrane, external ear and ear canal normal.  Left Ear: Tympanic membrane, external ear and ear canal normal.  Nose: Nose normal. Right sinus exhibits no maxillary sinus tenderness and no frontal sinus tenderness. Left sinus exhibits no maxillary sinus tenderness and no frontal sinus tenderness.  Mouth/Throat: Uvula is midline, oropharynx is clear and moist and mucous membranes are normal. No oropharyngeal exudate.  Cobblestoning of the posterior oropharynx  Eyes: Conjunctivae are  normal. Right eye exhibits no discharge. Left eye exhibits no discharge.  Neck: Normal range of motion. Neck supple.  Cardiovascular: Normal rate and regular rhythm.   Murmur heard.  Systolic murmur is present with a grade of 2/6  Pulmonary/Chest: Effort normal and breath sounds normal. No respiratory distress.  Lymphadenopathy:       Head (right side): No tonsillar adenopathy present.       Head (left side): No tonsillar adenopathy present.    She has no cervical adenopathy.  Neurological: She is alert and oriented to person, place, and time. She has normal strength. Coordination normal.  Skin: Skin is warm and dry. No rash noted. She is not  diaphoretic.  Psychiatric: She has a normal mood and affect. Judgment normal.  Nursing note and vitals reviewed.   ED Course  Procedures (including critical care time) Labs Review Labs Reviewed - No data to display  Imaging Review No results found.   MDM   1. Viral sinusitis    Exam is normal. Most likely viral, especially given she was exposed to someone else with similar symptoms. Treat symptomatically. Advised to start antibiotic if worsening or if no improvement in one week. Return precautions discussed with the patient.   Meds ordered this encounter  Medications  . fluticasone (FLONASE) 50 MCG/ACT nasal spray    Sig: Place 2 sprays into both nostrils 2 (two) times daily. Decrease to 2 sprays/nostril daily after 5 days    Dispense:  16 g    Refill:  2    Order Specific Question:  Supervising Provider    Answer:  Lynne Leader, [place]  . predniSONE (DELTASONE) 10 MG tablet    Sig: 4 tabs PO QD for 4 days; 3 tabs PO QD for 3 days; 2 tabs PO QD for 2 days; 1 tab PO QD for 1 day    Dispense:  30 tablet    Refill:  0    Order Specific Question:  Supervising Provider    Answer:  Lynne Leader, [place]  . amoxicillin-clavulanate (AUGMENTIN) 875-125 MG per tablet    Sig: Take 1 tablet by mouth every 12 (twelve) hours.    Dispense:  14  tablet    Refill:  0    Order Specific Question:  Supervising Provider    Answer:  Lynne Leader, [place]       Liam Graham, PA-C 09/18/14 1104

## 2014-09-18 NOTE — Discharge Instructions (Signed)
Antibiotic Resistance Antibiotics are drugs. They fight infections caused by bacteria. Antibiotics greatly reduce illness and death from infectious diseases. Over time, the bacteria that antibiotics once controlled are much harder to kill. CAUSES  Antibiotic resistance occurs when bacteria change in some way. These changes can lessen the abilities of drugs designed to cure infections. The overuse of antibiotics can cause antibiotic resistance. Almost all important bacterial infections in the world are becoming resistant to drugs. Antibiotic resistance has been called one of the world's most pressing public health problems.  Antibiotics should be used to treat bacterial infections. But they are not effective against viral infections. These include the common cold, most sore throats, and the flu. Smart use of antibiotics will control the spread of resistance.  TREATMENT   Only use antibiotics as prescribed by your caregiver.  Talk with your caregiver about antibiotic resistance.  Ask what else you can do to feel better.  Do not take an antibiotic for a viral infection. This could be a cold, cough, or the flu.  Do not save some of your antibiotic for the next time you get sick.  Take an antibiotic exactly as the caregiver tells you.  Do not take an antibiotic that is prescribed for someone else.  Use the antibiotic as directed. Take the correct dose at the scheduled time. SEEK MEDICAL CARE IF:  You react to the antibiotic with:  A rash.  Itching.  An upset stomach. Document Released: 01/19/2003 Document Revised: 03/15/2014 Document Reviewed: 08/23/2008 [facility] Patient Information 2015 [place], [place]. This information is not intended to replace advice given to you by your health care provider. Make sure you discuss any questions you have with your health care provider.  Sinusitis Sinusitis is redness, soreness, and inflammation of the paranasal sinuses. Paranasal sinuses are air  pockets within the bones of your face (beneath the eyes, the middle of the forehead, or above the eyes). In healthy paranasal sinuses, mucus is able to drain out, and air is able to circulate through them by way of your nose. However, when your paranasal sinuses are inflamed, mucus and air can become trapped. This can allow bacteria and other germs to grow and cause infection. Sinusitis can develop quickly and last only a short time (acute) or continue over a long period (chronic). Sinusitis that lasts for more than 12 weeks is considered chronic.  CAUSES  Causes of sinusitis include:  Allergies.  Structural abnormalities, such as displacement of the cartilage that separates your nostrils (deviated septum), which can decrease the air flow through your nose and sinuses and affect sinus drainage.  Functional abnormalities, such as when the small hairs (cilia) that line your sinuses and help remove mucus do not work properly or are not present. SIGNS AND SYMPTOMS  Symptoms of acute and chronic sinusitis are the same. The primary symptoms are pain and pressure around the affected sinuses. Other symptoms include:  Upper toothache.  Earache.  Headache.  Bad breath.  Decreased sense of smell and taste.  A cough, which worsens when you are lying flat.  Fatigue.  Fever.  Thick drainage from your nose, which often is green and may contain pus (purulent).  Swelling and warmth over the affected sinuses. DIAGNOSIS  Your health care provider will perform a physical exam. During the exam, your health care provider may:  Look in your nose for signs of abnormal growths in your nostrils (nasal polyps).  Tap over the affected sinus to check for signs of infection.  View  the inside of your sinuses (endoscopy) using an imaging device that has a light attached (endoscope). If your health care provider suspects that you have chronic sinusitis, one or more of the following tests may be  recommended:  Allergy tests.  Nasal culture. A sample of mucus is taken from your nose, sent to a lab, and screened for bacteria.  Nasal cytology. A sample of mucus is taken from your nose and examined by your health care provider to determine if your sinusitis is related to an allergy. TREATMENT  Most cases of acute sinusitis are related to a viral infection and will resolve on their own within 10 days. Sometimes medicines are prescribed to help relieve symptoms (pain medicine, decongestants, nasal steroid sprays, or saline sprays).  However, for sinusitis related to a bacterial infection, your health care provider will prescribe antibiotic medicines. These are medicines that will help kill the bacteria causing the infection.  Rarely, sinusitis is caused by a fungal infection. In theses cases, your health care provider will prescribe antifungal medicine. For some cases of chronic sinusitis, surgery is needed. Generally, these are cases in which sinusitis recurs more than 3 times per year, despite other treatments. HOME CARE INSTRUCTIONS   Drink plenty of water. Water helps thin the mucus so your sinuses can drain more easily.  Use a humidifier.  Inhale steam 3 to 4 times a day (for example, sit in the bathroom with the shower running).  Apply a warm, moist washcloth to your face 3 to 4 times a day, or as directed by your health care provider.  Use saline nasal sprays to help moisten and clean your sinuses.  Take medicines only as directed by your health care provider.  If you were prescribed either an antibiotic or antifungal medicine, finish it all even if you start to feel better. SEEK IMMEDIATE MEDICAL CARE IF:  You have increasing pain or severe headaches.  You have nausea, vomiting, or drowsiness.  You have swelling around your face.  You have vision problems.  You have a stiff neck.  You have difficulty breathing. MAKE SURE YOU:   Understand these  instructions.  Will watch your condition.  Will get help right away if you are not doing well or get worse. Document Released: 10/29/2005 Document Revised: 03/15/2014 Document Reviewed: 11/13/2011 [facility] Patient Information 2015 [place], [place]. This information is not intended to replace advice given to you by your health care provider. Make sure you discuss any questions you have with your health care provider.

## 2014-09-20 ENCOUNTER — Telehealth: Payer: Self-pay | Admitting: *Deleted

## 2014-09-20 NOTE — Telephone Encounter (Signed)
[facility] Triage Call Report Triage Record Num: [id number] Operator: Prentice Docker [place] Patient Name: Jane Mooney Call Date & Time: 09/18/2014 5:19:46AM Patient Phone: [phone] PCP: [name] Patient Gender: Female PCP Fax : [phone] Patient DOB: Sep 21, 1951 Practice Name[facility] Reason for Call: Caller: Grayce/Patient; PCP: [name] (Adults only); CB#: [id number]; Call regarding Cough/Congestion; Onset 09/15/14 S&S began. Afebrile 98.1 oral. Productive cough green no blood noted. Slight scratchy throat but has improved since Wednesday.Nagging HA," not severe." Nothing taken for HA at this time. Took husbands prescription cough syrup before bed, cant remember name of it. It has improved her cough. No pain in sinus. Emergent S&S ruled out per URI Protocol: "productive cough with colored sputum(other than clear or white). Advised to be evaluated within 24hrs. No office appts available today(saturday) Pt will call office to see if she can be worked in. OFFICE SCHEDULER: please contact this pt with an appt for today if you have any. Thank You. Protocol(s) Used: Upper Respiratory Infection (URI) Recommended Outcome per Protocol: See Provider within 24 hours Reason for Outcome: Productive cough with colored sputum (other than clear or white sputum) Care Advice: ~ Use a cool mist humidifier to moisten air. Be sure to clean according to manufacturer's instructions. ~ May inhale steam from hot shower or heated water. Be careful to avoid burns. Limit or avoid exposure to irritants and allergens (e.g. air pollution, smoke/smoking, chemicals, dust, pollen, pet dander, etc.) ~ ~ If you can, stop smoking now and avoid all secondhand smoke. Drink more fluids -- water, low-sugar juices, tea and warm soup, especially chicken broth, are options. Avoid caffeinated or alcoholic beverages because they can increase the chance of dehydration. ~ ~ SYMPTOM / CONDITION  MANAGEMENT ~ INFECTION CONTROL ~ CAUTIONS Coughing up mucus or phlegm helps to get rid of an infection. A productive cough should not be stopped. A cough medicine with guaifenesin (Robitussin, Mucinex) can help loosen the mucus. Cough medicine with dextromethorphan (DM) should be avoided. Drinking lots of fluids can help loosen the mucus too, especially warm fluids. ~ Analgesic/Antipyretic Advice - Acetaminophen: Consider acetaminophen as directed on label or by pharmacist/provider for pain or fever PRECAUTIONS: - Use if there is no history of liver disease, alcoholism, or intake of three or more alcohol drinks per day - Only if approved by provider during pregnancy or when breastfeeding - During pregnancy, acetaminophen should not be taken more than 3 consecutive days without telling provider - Do not exceed recommended dose or frequency ~ ~ Go to the ED if having chest pain with breathing or breathing is becoming more difficult. Call provider if has a fever over 101.5 F (38.6 C) that has not responded to home care measures, having shaking chills or any fever in someone immunocompromised/frail elderly. ~ Respiratory Hygiene: - Cover the nose/mouth tightly with a tissue when coughing or sneezing. ~ 09/18/2014 5:44:20AM Page 1 of 2 [id number] [facility] Triage Call Report Patient Name: Aidaly Cordner continuation page/s - Use tissue 1 time and discard in the nearest waste receptacle. - Wash hands with soap and water or alcohol-based hand rub after coming into contact with respiratory secretions and contaminated objects/materials. - Alternatively when no tissue is available, cough into the bend of the elbow. - Avoid touching your eyes, nose or mouth. Total water intake includes drinking water, water in beverages, and water contained in food. Fluids make up about 80% of the body's total hydration need. Individual fluid requirement to maintain hydration vary based on  physical activity, environmental factors and illness. Limit fluids that contain sugar, caffeine, or alcohol. Urine will be very light yellow color when you drink enough fluids. ~ 11/

## 2014-10-11 ENCOUNTER — Ambulatory Visit (HOSPITAL_COMMUNITY)
Admission: RE | Admit: 2014-10-11 | Discharge: 2014-10-11 | Disposition: A | Discharge status: Home / Self Care | Payer: Medicare Other | Source: Ambulatory Visit | Attending: Gynecology | Admitting: Gynecology

## 2014-10-11 DIAGNOSIS — Z1231 Encounter for screening mammogram for malignant neoplasm of breast: Secondary | ICD-10-CM | POA: Insufficient documentation

## 2014-10-27 ENCOUNTER — Ambulatory Visit: Payer: Medicare Other | Admitting: Internal Medicine

## 2014-11-15 ENCOUNTER — Ambulatory Visit (INDEPENDENT_AMBULATORY_CARE_PROVIDER_SITE_OTHER): Payer: Medicare Other | Admitting: Internal Medicine

## 2014-11-15 ENCOUNTER — Encounter: Payer: Self-pay | Admitting: Internal Medicine

## 2014-11-15 VITALS — BP 138/76 | HR 63 | Temp 98.7°F | Wt 191.0 lb

## 2014-11-15 DIAGNOSIS — E538 Deficiency of other specified B group vitamins: Secondary | ICD-10-CM

## 2014-11-15 DIAGNOSIS — E669 Obesity, unspecified: Secondary | ICD-10-CM

## 2014-11-15 DIAGNOSIS — I10 Essential (primary) hypertension: Secondary | ICD-10-CM

## 2014-11-15 DIAGNOSIS — M25561 Pain in right knee: Secondary | ICD-10-CM

## 2014-11-15 DIAGNOSIS — Z23 Encounter for immunization: Secondary | ICD-10-CM

## 2014-11-15 MED ORDER — POTASSIUM CHLORIDE CRYS ER 10 MEQ PO TBCR: EXTENDED_RELEASE_TABLET | ORAL | Status: DC

## 2014-11-15 MED ORDER — LORAZEPAM 1 MG PO TABS: ORAL_TABLET | ORAL | Status: DC

## 2014-11-15 MED ORDER — NAPROXEN 500 MG PO TABS: 500.0000 mg | ORAL_TABLET | Freq: Two times a day (BID) | ORAL | Status: DC | PRN

## 2014-11-15 NOTE — Progress Notes (Signed)
Pre visit review using our clinic review tool, if applicable. No additional management support is needed unless otherwise documented below in the visit note. 

## 2014-11-15 NOTE — Assessment & Plan Note (Signed)
Continue with current prescription therapy as reflected on the Med list.  

## 2014-11-15 NOTE — Patient Instructions (Signed)
Low carb diet 

## 2014-11-15 NOTE — Progress Notes (Signed)
   Subjective:     HPI    The patient is here to follow up on elev glu, chronic depression, anxiety, headaches and chronic moderate OA/LBP symptoms controlled with medicines, diet and exercise. C/o R knee pain - post-op (Jun 14, 2014) Mother died in 9 - grieving   Wt Readings from Last 3 Encounters:  11/15/14 191 lb (86.637 kg)  07/28/14 186 lb (84.369 kg)  06-14-2014 181 lb 6 oz (82.271 kg)   BP Readings from Last 3 Encounters:  11/15/14 138/76  09/18/14 175/86  07/28/14 138/80     Review of Systems  Constitutional: Negative for chills, activity change, appetite change, fatigue and unexpected weight change.  HENT: Negative for congestion, mouth sores and sinus pressure.   Eyes: Negative for visual disturbance.  Respiratory: Negative for cough and chest tightness.   Gastrointestinal: Negative for nausea and abdominal pain.  Genitourinary: Negative for frequency, difficulty urinating and vaginal pain.  Musculoskeletal: Negative for back pain and gait problem.  Skin: Negative for pallor and rash.  Neurological: Negative for dizziness, tremors, weakness, numbness and headaches.  Psychiatric/Behavioral: Negative for confusion and sleep disturbance. The patient is not nervous/anxious.        Objective:   Physical Exam  Constitutional: She appears well-developed. No distress.  HENT:  Head: Normocephalic.  Right Ear: External ear normal.  Left Ear: External ear normal.  Nose: Nose normal.  Mouth/Throat: Oropharynx is clear and moist.  Eyes: Conjunctivae are normal. Pupils are equal, round, and reactive to light. Right eye exhibits no discharge. Left eye exhibits no discharge.  Neck: Normal range of motion. Neck supple. No JVD present. No tracheal deviation present. No thyromegaly present.  Cardiovascular: Normal rate, regular rhythm and normal heart sounds.   Pulmonary/Chest: No stridor. No respiratory distress. She has no wheezes.  Abdominal: Soft. Bowel sounds are normal.  She exhibits no distension and no mass. There is no tenderness. There is no rebound and no guarding.  Musculoskeletal: She exhibits tenderness. She exhibits no edema.  B knees  Lymphadenopathy:    She has no cervical adenopathy.  Neurological: She displays normal reflexes. No cranial nerve deficit. She exhibits normal muscle tone. Coordination normal.  Skin: No rash noted. No erythema.  Psychiatric: She has a normal mood and affect. Her behavior is normal. Judgment and thought content normal.   Limp    Lab Results  Component Value Date   WBC 5.7 07/28/2014   HGB 12.5 07/28/2014   HCT 38.6 07/28/2014   PLT 241.0 07/28/2014   GLUCOSE 109* 07/28/2014   CHOL 222* 07/06/2013   TRIG 160.0* 07/06/2013   HDL 48.20 07/06/2013   LDLDIRECT 153.6 07/06/2013   ALT 13 07/28/2014   AST 16 07/28/2014   NA 139 07/28/2014   K 3.9 07/28/2014   CL 102 07/28/2014   CREATININE 0.9 07/28/2014   BUN 10 07/28/2014   CO2 31 07/28/2014   TSH 0.79 07/06/2013   HGBA1C 5.8 07/28/2014            Assessment & Plan:

## 2014-11-15 NOTE — Assessment & Plan Note (Signed)
Options discussed Naproxen prn  Will inject in 1 mo if needed

## 2014-11-16 ENCOUNTER — Telehealth: Payer: Self-pay | Admitting: Internal Medicine

## 2014-11-16 NOTE — Telephone Encounter (Signed)
emmi emailed °

## 2014-11-25 ENCOUNTER — Telehealth: Payer: Self-pay | Admitting: Internal Medicine

## 2014-11-25 ENCOUNTER — Telehealth: Payer: Self-pay | Admitting: *Deleted

## 2014-11-25 MED ORDER — TRIAMTERENE-HCTZ 37.5-25 MG PO TABS: 2.0000 | ORAL_TABLET | Freq: Every day | ORAL | Status: DC

## 2014-11-25 NOTE — Telephone Encounter (Signed)
Stop Naproxen Take Maxzide (a diuretic) 2 tabs q am Thx

## 2014-11-25 NOTE — Telephone Encounter (Signed)
Notifed pt with md response...Jane Mooney

## 2014-11-25 NOTE — Telephone Encounter (Signed)
Pt is retaining water, feet are a little swollen. Pt is requesting a diuretic. Pt was seen 1/4 for OV. Pls advise   [phone]

## 2014-11-25 NOTE — Telephone Encounter (Signed)
Pt left msg stating [facility] did not received maxzide script. MD hit no print. Resent to [facility]. Called pt no answer LMOM resent med...Jane Mooney

## 2014-12-01 ENCOUNTER — Encounter: Payer: Self-pay | Admitting: Internal Medicine

## 2015-02-07 ENCOUNTER — Other Ambulatory Visit: Payer: Self-pay | Admitting: Internal Medicine

## 2015-02-07 ENCOUNTER — Other Ambulatory Visit: Payer: Self-pay | Admitting: *Deleted

## 2015-02-07 MED ORDER — AMLODIPINE-OLMESARTAN 10-40 MG PO TABS: ORAL_TABLET | ORAL | Status: DC

## 2015-03-16 ENCOUNTER — Ambulatory Visit: Payer: Medicare Other | Admitting: Internal Medicine

## 2015-03-31 ENCOUNTER — Encounter: Payer: Self-pay | Admitting: Internal Medicine

## 2015-03-31 ENCOUNTER — Ambulatory Visit (INDEPENDENT_AMBULATORY_CARE_PROVIDER_SITE_OTHER): Payer: Medicare Other | Admitting: Internal Medicine

## 2015-03-31 VITALS — BP 104/54 | HR 65 | Temp 98.4°F | Ht 63.0 in | Wt 182.5 lb

## 2015-03-31 DIAGNOSIS — M5441 Lumbago with sciatica, right side: Secondary | ICD-10-CM

## 2015-03-31 DIAGNOSIS — R634 Abnormal weight loss: Secondary | ICD-10-CM

## 2015-03-31 DIAGNOSIS — E538 Deficiency of other specified B group vitamins: Secondary | ICD-10-CM | POA: Diagnosis not present

## 2015-03-31 DIAGNOSIS — Z23 Encounter for immunization: Secondary | ICD-10-CM

## 2015-03-31 DIAGNOSIS — I10 Essential (primary) hypertension: Secondary | ICD-10-CM

## 2015-03-31 MED ORDER — AMLODIPINE BESYLATE 10 MG PO TABS: 10.0000 mg | ORAL_TABLET | Freq: Every day | ORAL | Status: DC

## 2015-03-31 MED ORDER — TRAMADOL HCL 50 MG PO TABS: 50.0000 mg | ORAL_TABLET | Freq: Two times a day (BID) | ORAL | Status: DC | PRN

## 2015-03-31 MED ORDER — LOSARTAN POTASSIUM 100 MG PO TABS: 100.0000 mg | ORAL_TABLET | Freq: Every day | ORAL | Status: DC

## 2015-03-31 NOTE — Assessment & Plan Note (Signed)
Cont w/wt loss Tramadol prn  Potential benefits of a long term opioids use as well as potential risks (i.e. addiction risk, apnea etc) and complications (i.e. Somnolence, constipation and others) were explained to the patient and were aknowledged.

## 2015-03-31 NOTE — Assessment & Plan Note (Signed)
On B12 

## 2015-03-31 NOTE — Progress Notes (Signed)
Pre visit review using our clinic review tool, if applicable. No additional management support is needed unless otherwise documented below in the visit note. 

## 2015-03-31 NOTE — Assessment & Plan Note (Signed)
Wt Readings from Last 3 Encounters:  03/31/15 182 lb 8 oz (82.781 kg)  11/15/14 191 lb (86.637 kg)  07/28/14 186 lb (84.369 kg)

## 2015-03-31 NOTE — Progress Notes (Signed)
   Subjective:     HPI    The patient is here to follow up on elev glu, chronic depression, anxiety, headaches and chronic moderate OA/LBP symptoms controlled with medicines, diet and exercise. C/o R knee pain - post-op (06/13/14) Mother died in 7 - grieving   Wt Readings from Last 3 Encounters:  03/31/15 182 lb 8 oz (82.781 kg)  11/15/14 191 lb (86.637 kg)  07/28/14 186 lb (84.369 kg)   BP Readings from Last 3 Encounters:  03/31/15 104/54  11/15/14 138/76  09/18/14 175/86     Review of Systems  Constitutional: Negative for chills, activity change, appetite change, fatigue and unexpected weight change.  HENT: Negative for congestion, mouth sores and sinus pressure.   Eyes: Negative for visual disturbance.  Respiratory: Negative for cough and chest tightness.   Gastrointestinal: Negative for nausea and abdominal pain.  Genitourinary: Negative for frequency, difficulty urinating and vaginal pain.  Musculoskeletal: Negative for back pain and gait problem.  Skin: Negative for pallor and rash.  Neurological: Negative for dizziness, tremors, weakness, numbness and headaches.  Psychiatric/Behavioral: Negative for confusion and sleep disturbance. The patient is not nervous/anxious.        Objective:   Physical Exam  Constitutional: She appears well-developed. No distress.  HENT:  Head: Normocephalic.  Right Ear: External ear normal.  Left Ear: External ear normal.  Nose: Nose normal.  Mouth/Throat: Oropharynx is clear and moist.  Eyes: Conjunctivae are normal. Pupils are equal, round, and reactive to light. Right eye exhibits no discharge. Left eye exhibits no discharge.  Neck: Normal range of motion. Neck supple. No JVD present. No tracheal deviation present. No thyromegaly present.  Cardiovascular: Normal rate, regular rhythm and normal heart sounds.   Pulmonary/Chest: No stridor. No respiratory distress. She has no wheezes.  Abdominal: Soft. Bowel sounds are normal.  She exhibits no distension and no mass. There is no tenderness. There is no rebound and no guarding.  Musculoskeletal: She exhibits tenderness. She exhibits no edema.  B knees  Lymphadenopathy:    She has no cervical adenopathy.  Neurological: She displays normal reflexes. No cranial nerve deficit. She exhibits normal muscle tone. Coordination normal.  Skin: No rash noted. No erythema.  Psychiatric: She has a normal mood and affect. Her behavior is normal. Judgment and thought content normal.   Limp    Lab Results  Component Value Date   WBC 5.7 07/28/2014   HGB 12.5 07/28/2014   HCT 38.6 07/28/2014   PLT 241.0 07/28/2014   GLUCOSE 109* 07/28/2014   CHOL 222* 07/06/2013   TRIG 160.0* 07/06/2013   HDL 48.20 07/06/2013   LDLDIRECT 153.6 07/06/2013   ALT 13 07/28/2014   AST 16 07/28/2014   NA 139 07/28/2014   K 3.9 07/28/2014   CL 102 07/28/2014   CREATININE 0.9 07/28/2014   BUN 10 07/28/2014   CO2 31 07/28/2014   TSH 0.79 07/06/2013   HGBA1C 5.8 07/28/2014            Assessment & Plan:

## 2015-03-31 NOTE — Assessment & Plan Note (Addendum)
Amlod-olmesartan, Triamt-HCTZ - too $$$ BP Readings from Last 3 Encounters:  03/31/15 104/54  11/15/14 138/76  09/18/14 175/86

## 2015-06-23 ENCOUNTER — Other Ambulatory Visit: Payer: Self-pay | Admitting: Internal Medicine

## 2015-06-27 ENCOUNTER — Ambulatory Visit (INDEPENDENT_AMBULATORY_CARE_PROVIDER_SITE_OTHER): Payer: Medicare Other

## 2015-06-27 VITALS — BP 140/70 | Ht 63.5 in | Wt 175.5 lb

## 2015-06-27 DIAGNOSIS — Z1231 Encounter for screening mammogram for malignant neoplasm of breast: Secondary | ICD-10-CM | POA: Diagnosis not present

## 2015-06-27 DIAGNOSIS — Z0000 Encounter for general adult medical examination without abnormal findings: Secondary | ICD-10-CM | POA: Diagnosis not present

## 2015-06-27 NOTE — Patient Instructions (Addendum)
Ms. Reichart , Thank you for taking time to come for your Medicare Wellness Visit. I appreciate your ongoing commitment to your health goals. Please review the following plan we discussed and let me know if I can assist you in the future.   Plan: 1.Will review for Advanced Directive; [facility] offers free advance directive forms, as well as assistance in completing the forms themselves. For assistance, contact the Spiritual Care Department at [phone], or the Clinical Social Work Department at [phone]. 2; will plan to discuss Dexa scan with Dr. Alain Marion 3. Will fup on norvasc for ankle edema 4. Will get mammogram this fall in October / order was placed  Just for information:  Controllable  risk for heart disease reviewed for goal setting: I Excess body fat around the waist Waist circumference women >35 Triglycerides > 150 HDL < 50 BP > 130/85 Glucose > 100 Goals are determined by the physician and based on other relevant data and risk Plan Lifestyle changes Risk Calculator: [email or web address]  BP Education: <120/80; any elevation will warrant further checks    These are the goals we discussed: Goals    . Weight < 160 lb (72.576 kg)     Joined [employer] and plans to lose to 160; [employer] has great social support and calorie tracks;         This is a list of the screening recommended for you and due dates:  Health Maintenance  Topic Date Due  .  Hepatitis C: One time screening is recommended by Center for Disease Control  (CDC) for  adults born from [birth year] through 1967.   07-01-1951  . HIV Screening  07/12/1966  . DEXA scan (bone density measurement)  07/12/2014  . Flu Shot  06/13/2015  . Mammogram  10/11/2016  . Tetanus Vaccine  07/02/2022  . Colon Cancer Screening  12/24/2023  . Shingles Vaccine  Completed  . Pneumonia vaccines  Completed    Advance Directive Advance directives are the legal documents that allow you to make choices about your health care  and medical treatment if you cannot speak for yourself. Advance directives are a way for you to communicate your wishes to family, friends, and health care providers. The specified people can then convey your decisions about end-of-life care to avoid confusion if you should become unable to communicate. Ideally, the process of discussing and writing advance directives should happen over time rather than making decisions all at once. Advance directives can be modified as your situation changes, and you can change your mind at any time, even after you have signed the advance directives. Each state has its own laws regarding advance directives. You may want to check with your health care provider, attorney, or state representative about the law in your state. Below are some examples of advance directives. LIVING WILL A living will is a set of instructions documenting your wishes about medical care when you cannot care for yourself. It is used if you become:  Terminally ill.  Incapacitated.  Unable to communicate.  Unable to make decisions. Items to consider in your living will include:  The use or non-use of life-sustaining equipment, such as dialysis machines and breathing machines (ventilators).  A do not resuscitate (DNR) order, which is the instruction not to use cardiopulmonary resuscitation (CPR) if breathing or heartbeat stops.  Tube feeding.  Withholding of food and fluids.  Comfort (palliative) care when the goal becomes comfort rather than a cure.  Organ and tissue donation. A living will does  not give instructions about distribution of your money and property if you should pass away. It is advisable to seek the expert advice of a lawyer in drawing up a will regarding your possessions. Decisions about taxes, beneficiaries, and asset distribution will be legally binding. This process can relieve your family and friends of any burdens surrounding disputes or questions that may come up  about the allocation of your assets. DO NOT RESUSCITATE (DNR) A do not resuscitate (DNR) order is a request to not have CPR in the event that your heart stops beating or you stop breathing. Unless given other instructions, a health care provider will try to help any patient whose heart has stopped or who has stopped breathing.  HEALTH CARE PROXY AND DURABLE POWER OF ATTORNEY FOR HEALTH CARE A health care proxy is a person (agent) appointed to make medical decisions for you if you cannot. Generally, people choose someone they know well and trust to represent their preferences when they can no longer do so. You should be sure to ask this person for agreement to act as your agent. An agent may have to exercise judgment in the event of a medical decision for which your wishes are not known. The durable power of attorney for health care is the legal document that names your health care proxy. Once written, it should be:  Signed.  Notarized.  Dated.  Copied.  Witnessed.  Incorporated into your medical record. You may also want to appoint someone to manage your financial affairs if you cannot. This is called a durable power of attorney for finances. It is a separate legal document from the durable power of attorney for health care. You may choose the same person or someone different from your health care proxy to act as your agent in financial matters. Document Released: 02/05/2008 Document Revised: 11/03/2013 Document Reviewed: 03/18/2013 [facility] Patient Information 2015 [place], [place]. This information is not intended to replace advice given to you by your health care provider. Make sure you discuss any questions you have with your health care provider.  Osteoporosis Throughout your life, your body breaks down old bone and replaces it with new bone. As you get older, your body does not replace bone as quickly as it breaks it down. By the age of 4 years, most people begin to gradually lose bone  because of the imbalance between bone loss and replacement. Some people lose more bone than others. Bone loss beyond a specified normal degree is considered osteoporosis.  Osteoporosis affects the strength and durability of your bones. The inside of the ends of your bones and your flat bones, like the bones of your pelvis, look like honeycomb, filled with tiny open spaces. As bone loss occurs, your bones become less dense. This means that the open spaces inside your bones become bigger and the walls between these spaces become thinner. This makes your bones weaker. Bones of a person with osteoporosis can become so weak that they can break (fracture) during minor accidents, such as a simple fall. CAUSES  The following factors have been associated with the development of osteoporosis:  Smoking.  Drinking more than 2 alcoholic drinks several days per week.  Long-term use of certain medicines:  Corticosteroids.  Chemotherapy medicines.  Thyroid medicines.  Antiepileptic medicines.  Gonadal hormone suppression medicine.  Immunosuppression medicine.  Being underweight.  Lack of physical activity.  Lack of exposure to the sun. This can lead to vitamin D deficiency.  Certain medical conditions:  Certain inflammatory  bowel diseases, such as Crohn disease and ulcerative colitis.  Diabetes.  Hyperthyroidism.  Hyperparathyroidism. RISK FACTORS Anyone can develop osteoporosis. However, the following factors can increase your risk of developing osteoporosis:  Gender--Women are at higher risk than men.  Age--Being older than 50 years increases your risk.  Ethnicity--White and Asian people have an increased risk.  Weight --Being extremely underweight can increase your risk of osteoporosis.  Family history of osteoporosis--Having a family member who has developed osteoporosis can increase your risk. SYMPTOMS  Usually, people with osteoporosis have no symptoms.  DIAGNOSIS  Signs  during a physical exam that may prompt your caregiver to suspect osteoporosis include:  Decreased height. This is usually caused by the compression of the bones that form your spine (vertebrae) because they have weakened and become fractured.  A curving or rounding of the upper back (kyphosis). To confirm signs of osteoporosis, your caregiver may request a procedure that uses 2 low-dose X-ray beams with different levels of energy to measure your bone mineral density (dual-energy X-ray absorptiometry [DXA]). Also, your caregiver may check your level of vitamin D. TREATMENT  The goal of osteoporosis treatment is to strengthen bones in order to decrease the risk of bone fractures. There are different types of medicines available to help achieve this goal. Some of these medicines work by slowing the processes of bone loss. Some medicines work by increasing bone density. Treatment also involves making sure that your levels of calcium and vitamin D are adequate. PREVENTION  There are things you can do to help prevent osteoporosis. Adequate intake of calcium and vitamin D can help you achieve optimal bone mineral density. Regular exercise can also help, especially resistance and weight-bearing activities. If you smoke, quitting smoking is an important part of osteoporosis prevention. MAKE SURE YOU:  Understand these instructions.  Will watch your condition.  Will get help right away if you are not doing well or get worse. FOR MORE INFORMATION www.osteo.org and EquipmentWeekly.com.ee Document Released: 08/08/2005 Document Revised: 02/23/2013 Document Reviewed: 10/13/2011 [facility] Patient Information 2015 [place], [place]. This information is not intended to replace advice given to you by your health care provider. Make sure you discuss any questions you have with your health care provider.   Fall Prevention and Home Safety Falls cause injuries and can affect all age groups. It is possible to prevent falls.  HOW  TO PREVENT FALLS  Wear shoes with rubber soles that do not have an opening for your toes.  Keep the inside and outside of your house well lit.  Use night lights throughout your home.  Remove clutter from floors.  Clean up floor spills.  Remove throw rugs or fasten them to the floor with carpet tape.  Do not place electrical cords across pathways.  Put grab bars by your tub, shower, and toilet. Do not use towel bars as grab bars.  Put handrails on both sides of the stairway. Fix loose handrails.  Do not climb on stools or stepladders, if possible.  Do not wax your floors.  Repair uneven or unsafe sidewalks, walkways, or stairs.  Keep items you use a lot within reach.  Be aware of pets.  Keep emergency numbers next to the telephone.  Put smoke detectors in your home and near bedrooms. Ask your doctor what other things you can do to prevent falls. Document Released: 08/25/2009 Document Revised: 04/29/2012 Document Reviewed: 01/29/2012 [facility] Patient Information 2015 [place], [place]. This information is not intended to replace advice given to you by your  health care provider. Make sure you discuss any questions you have with your health care provider.  Health Maintenance Adopting a healthy lifestyle and getting preventive care can go a long way to promote health and wellness. Talk with your health care provider about what schedule of regular examinations is right for you. This is a good chance for you to check in with your provider about disease prevention and staying healthy. In between checkups, there are plenty of things you can do on your own. Experts have done a lot of research about which lifestyle changes and preventive measures are most likely to keep you healthy. Ask your health care provider for more information. WEIGHT AND DIET  Eat a healthy diet  Be sure to include plenty of vegetables, fruits, low-fat dairy products, and lean protein.  Do not eat a lot of  foods high in solid fats, added sugars, or salt.  Get regular exercise. This is one of the most important things you can do for your health.  Most adults should exercise for at least 150 minutes each week. The exercise should increase your heart rate and make you sweat (moderate-intensity exercise).  Most adults should also do strengthening exercises at least twice a week. This is in addition to the moderate-intensity exercise.  Maintain a healthy weight  Body mass index (BMI) is a measurement that can be used to identify possible weight problems. It estimates body fat based on height and weight. Your health care provider can help determine your BMI and help you achieve or maintain a healthy weight.  For females 46 years of age and older:   A BMI below 18.5 is considered underweight.  A BMI of 18.5 to 24.9 is normal.  A BMI of 25 to 29.9 is considered overweight.  A BMI of 30 and above is considered obese.  Watch levels of cholesterol and blood lipids  You should start having your blood tested for lipids and cholesterol at 66 years of age, then have this test every 5 years.  You may need to have your cholesterol levels checked more often if:  Your lipid or cholesterol levels are high.  You are older than 66 years of age.  You are at high risk for heart disease.  CANCER SCREENING   Lung Cancer  Lung cancer screening is recommended for adults 5-66 years old who are at high risk for lung cancer because of a history of smoking.  A yearly low-dose CT scan of the lungs is recommended for people who:  Currently smoke.  Have quit within the past 15 years.  Have at least a 30-pack-year history of smoking. A pack year is smoking an average of one pack of cigarettes a day for 1 year.  Yearly screening should continue until it has been 15 years since you quit.  Yearly screening should stop if you develop a health problem that would prevent you from having lung cancer  treatment.  Breast Cancer  Practice breast self-awareness. This means understanding how your breasts normally appear and feel.  It also means doing regular breast self-exams. Let your health care provider know about any changes, no matter how small.  If you are in your 20s or 30s, you should have a clinical breast exam (CBE) by a health care provider every 1-3 years as part of a regular health exam.  If you are 13 or older, have a CBE every year. Also consider having a breast X-ray (mammogram) every year.  If you have a  family history of breast cancer, talk to your health care provider about genetic screening.  If you are at high risk for breast cancer, talk to your health care provider about having an MRI and a mammogram every year.  Breast cancer gene (BRCA) assessment is recommended for women who have family members with BRCA-related cancers. BRCA-related cancers include:  Breast.  Ovarian.  Tubal.  Peritoneal cancers.  Results of the assessment will determine the need for genetic counseling and BRCA1 and BRCA2 testing. Cervical Cancer Routine pelvic examinations to screen for cervical cancer are no longer recommended for nonpregnant women who are considered low risk for cancer of the pelvic organs (ovaries, uterus, and vagina) and who do not have symptoms. A pelvic examination may be necessary if you have symptoms including those associated with pelvic infections. Ask your health care provider if a screening pelvic exam is right for you.   The Pap test is the screening test for cervical cancer for women who are considered at risk.  If you had a hysterectomy for a problem that was not cancer or a condition that could lead to cancer, then you no longer need Pap tests.  If you are older than 65 years, and you have had normal Pap tests for the past 10 years, you no longer need to have Pap tests.  If you have had past treatment for cervical cancer or a condition that could lead to  cancer, you need Pap tests and screening for cancer for at least 20 years after your treatment.  If you no longer get a Pap test, assess your risk factors if they change (such as having a new sexual partner). This can affect whether you should start being screened again.  Some women have medical problems that increase their chance of getting cervical cancer. If this is the case for you, your health care provider may recommend more frequent screening and Pap tests.  The human papillomavirus (HPV) test is another test that may be used for cervical cancer screening. The HPV test looks for the virus that can cause cell changes in the cervix. The cells collected during the Pap test can be tested for HPV.  The HPV test can be used to screen women 64 years of age and older. Getting tested for HPV can extend the interval between normal Pap tests from three to five years.  An HPV test also should be used to screen women of any age who have unclear Pap test results.  After 66 years of age, women should have HPV testing as often as Pap tests.  Colorectal Cancer  This type of cancer can be detected and often prevented.  Routine colorectal cancer screening usually begins at 66 years of age and continues through 66 years of age.  Your health care provider may recommend screening at an earlier age if you have risk factors for colon cancer.  Your health care provider may also recommend using home test kits to check for hidden blood in the stool.  A small camera at the end of a tube can be used to examine your colon directly (sigmoidoscopy or colonoscopy). This is done to check for the earliest forms of colorectal cancer.  Routine screening usually begins at age 27.  Direct examination of the colon should be repeated every 5-10 years through 66 years of age. However, you may need to be screened more often if early forms of precancerous polyps or small growths are found. Skin Cancer  Check your skin  from head to toe regularly.  Tell your health care provider about any new moles or changes in moles, especially if there is a change in a mole's shape or color.  Also tell your health care provider if you have a mole that is larger than the size of a pencil eraser.  Always use sunscreen. Apply sunscreen liberally and repeatedly throughout the day.  Protect yourself by wearing long sleeves, pants, a wide-brimmed hat, and sunglasses whenever you are outside. HEART DISEASE, DIABETES, AND HIGH BLOOD PRESSURE   Have your blood pressure checked at least every 1-2 years. High blood pressure causes heart disease and increases the risk of stroke.  If you are between 33 years and 59 years old, ask your health care provider if you should take aspirin to prevent strokes.  Have regular diabetes screenings. This involves taking a blood sample to check your fasting blood sugar level.  If you are at a normal weight and have a low risk for diabetes, have this test once every three years after 66 years of age.  If you are overweight and have a high risk for diabetes, consider being tested at a younger age or more often. PREVENTING INFECTION  Hepatitis B  If you have a higher risk for hepatitis B, you should be screened for this virus. You are considered at high risk for hepatitis B if:  You were born in a country where hepatitis B is common. Ask your health care provider which countries are considered high risk.  Your parents were born in a high-risk country, and you have not been immunized against hepatitis B (hepatitis B vaccine).  You have HIV or AIDS.  You use needles to inject street drugs.  You live with someone who has hepatitis B.  You have had sex with someone who has hepatitis B.  You get hemodialysis treatment.  You take certain medicines for conditions, including cancer, organ transplantation, and autoimmune conditions. Hepatitis C  Blood testing is recommended for:  Everyone  born from [birth year] through 1967.  Anyone with known risk factors for hepatitis C. Sexually transmitted infections (STIs)  You should be screened for sexually transmitted infections (STIs) including gonorrhea and chlamydia if:  You are sexually active and are younger than 66 years of age.  You are older than 66 years of age and your health care provider tells you that you are at risk for this type of infection.  Your sexual activity has changed since you were last screened and you are at an increased risk for chlamydia or gonorrhea. Ask your health care provider if you are at risk.  If you do not have HIV, but are at risk, it may be recommended that you take a prescription medicine daily to prevent HIV infection. This is called pre-exposure prophylaxis (PrEP). You are considered at risk if:  You are sexually active and do not regularly use condoms or know the HIV status of your partner(s).  You take drugs by injection.  You are sexually active with a partner who has HIV. Talk with your health care provider about whether you are at high risk of being infected with HIV. If you choose to begin PrEP, you should first be tested for HIV. You should then be tested every 3 months for as long as you are taking PrEP.  PREGNANCY   If you are premenopausal and you may become pregnant, ask your health care provider about preconception counseling.  If you may become pregnant, take 400 to 800  micrograms (mcg) of folic acid every day.  If you want to prevent pregnancy, talk to your health care provider about birth control (contraception). OSTEOPOROSIS AND MENOPAUSE   Osteoporosis is a disease in which the bones lose minerals and strength with aging. This can result in serious bone fractures. Your risk for osteoporosis can be identified using a bone density scan.  If you are 14 years of age or older, or if you are at risk for osteoporosis and fractures, ask your health care provider if you should be  screened.  Ask your health care provider whether you should take a calcium or vitamin D supplement to lower your risk for osteoporosis.  Menopause may have certain physical symptoms and risks.  Hormone replacement therapy may reduce some of these symptoms and risks. Talk to your health care provider about whether hormone replacement therapy is right for you.  HOME CARE INSTRUCTIONS   Schedule regular health, dental, and eye exams.  Stay current with your immunizations.   Do not use any tobacco products including cigarettes, chewing tobacco, or electronic cigarettes.  If you are pregnant, do not drink alcohol.  If you are breastfeeding, limit how much and how often you drink alcohol.  Limit alcohol intake to no more than 1 drink per day for nonpregnant women. One drink equals 12 ounces of beer, 5 ounces of wine, or 1 ounces of hard liquor.  Do not use street drugs.  Do not share needles.  Ask your health care provider for help if you need support or information about quitting drugs.  Tell your health care provider if you often feel depressed.  Tell your health care provider if you have ever been abused or do not feel safe at home. Document Released: 05/14/2011 Document Revised: 03/15/2014 Document Reviewed: 09/30/2013 [facility] Patient Information 2015 [place], [place]. This information is not intended to replace advice given to you by your health care provider. Make sure you discuss any questions you have with your health care provider.

## 2015-06-27 NOTE — Progress Notes (Addendum)
Subjective:   Jane Mooney is a 64 y.o. female who presents for an Initial Medicare Annual Wellness Visit.  Review of Systems     HRA assessment completed during visit;   Patient is here for Annual Wellness Assessment  The Patient was informed that this wellness visit is to identify risk and educate on how to reduce risk for increase disease through lifestyle changes.  The patient verbalized understanding that any voiced medical issues still need to be directed to their physician.    Problem list reviewed and are being managed medically; HTN;  States she feels she is in good health. Occasionally has sleepness night but not too often/ worked 3d shift Lives with spouse and 2 children; 1 one grand- child 14yo   Educated on lifestyle changes as appropriate: HTN, HYPERLIPIDEMIA; Obesity; BMI 32.4 / Now 30; due to weight loss A1c in Sept 2015; 5.8/ the patient is setting goals and hopes to bring weight down to 160lbs.   Patient is a retired Therapist, sports of 6 years Family Hx HTN/  Grandfather DM; lost both legs; Mother had HTN; died last April Died early in her life when she was 64yo  Diet; Educated on metabolic syndrome; starting weight 193 at [facility] in Jan Now 174; tops focus on calories; taking in 1200 calories They have to document everything they eat; and write down the calories Referred http://vang.com/ for online tool  Exercise; HDL 47; States they exercise during their class;  On every Tuesday they walk in the mall; [place] in mall; Bowling league;   Still has a lot of pain in right knee/ had torn ligament repair July 2015 1-10; now about a 4; had a lot of OA in right knee/ Does not limit function; No falls    Psychosocial support; home is 2 levels; bedrooms upstairs; safe community; firearm safety; smoke alarms;   Discussed Goal to improve health based on risk; weight loss  Screenings overdue Hepatitis C/ educated  HIV screening/ educated  Dexa scan: Will speak to Dr.  Alain Marion Flu; not available today  Ophthalmology exam; this year; Dr. Laurence Aly; [facility] in [place]; completed / no glaucoma Shingles 07/2012 Tetanus; 06/2012 Colonoscopy; 12/2023 EKG 05/2014 Mammogram:  Oct 2015; 09/2016/  will prefer to have one again this year in October; Order placed at [facility] the patient's preferred site Hearing: 4000hz  in both ears Dental: has dental work q 6 months Cardiac Risk Factors include: advanced age (>21men, >81 women);dyslipidemia;hypertension;obesity (BMI >30kg/m2)   No c/o of chest discomfort     Objective:    Today's Vitals   06/27/15 1006  BP: 140/70  Height: 5' 3.5" (1.613 m)  Weight: 175 lb 8 oz (79.606 kg)    Current Medications (verified) Outpatient Encounter Prescriptions as of 06/27/2015  Medication Sig  . amLODipine (NORVASC) 10 MG tablet Take 1 tablet (10 mg total) by mouth daily.  . Cholecalciferol (VITAMIN D3) 1000 UNITS tablet Take 1,000 Units by mouth daily.    . Clobetasol Propionate Emulsion 0.05 % topical foam Apply topically See admin instructions.  . Cyanocobalamin (VITAMIN B-12) 1000 MCG SUBL Place 1 tablet (1,000 mcg total) under the tongue daily.  . ferrous sulfate (FERRO-BOB) 325 (65 FE) MG tablet Take 325 mg by mouth daily. For anemia   . fish oil-omega-3 fatty acids 1000 MG capsule Take 1 g by mouth daily.    . fluticasone (FLONASE) 50 MCG/ACT nasal spray Place 2 sprays into both nostrils 2 (two) times daily. Decrease  to 2 sprays/nostril daily after 5 days  . hydrOXYzine (ATARAX/VISTARIL) 25 MG tablet TAKE 1 TABLET BY MOUTH EVERY 8 HOURS AS NEEDED  . loratadine (CLARITIN) 10 MG tablet Take 1 tablet (10 mg total) by mouth daily.  [name] LORazepam (ATIVAN) 1 MG tablet TAKE 1 TABLET BY MOUTH TWICE A DAY AS NEEDED FOR ANXIETY  . losartan (COZAAR) 100 MG tablet Take 1 tablet (100 mg total) by mouth daily.  . potassium chloride (KLOR-CON M10) 10 MEQ tablet TAKE 1 TABLET DAILY  .  triamterene-hydrochlorothiazide (MAXZIDE-25) 37.5-25 MG per tablet Take 2 tablets by mouth daily.  . hydrocortisone (ANUSOL-HC) 2.5 % rectal cream Apply rectally 2 times daily  . traMADol (ULTRAM) 50 MG tablet Take 1-2 tablets (50-100 mg total) by mouth 2 (two) times daily as needed for severe pain. (Patient not taking: Reported on 06/27/2015)   Facility-Administered Encounter Medications as of 06/27/2015  Medication  . methylPREDNISolone acetate (DEPO-MEDROL) injection 80 mg    Allergies (verified) Citalopram; Ibuprofen; Mirtazapine; and Quinapril hcl   History: Past Medical History  Diagnosis Date  . Anxiety 2010  . Depression   . HTN (hypertension)   . DJD (degenerative joint disease)     Left knee  . MVP (mitral valve prolapse)   . Symptomatic PVCs   . Allergic rhinitis   . LBP (low back pain)   . Hyperlipidemia   . Hemorrhoid   . Anemia   . Heart murmur   . Urticaria     recurrent  . PONV (postoperative nausea and vomiting)    Past Surgical History  Procedure Laterality Date  . Knee surgery  03/2007    Left Knee Arthr- Dr Onnie Graham  . Rotator cuff repair  2004    Left   . Abdominal hysterectomy  1984  . Tonsillectomy    . Hemorrhoid surgery    . Urtica    . Knee arthroscopy with medial menisectomy Right 05/21/2014    Procedure: RIGHT KNEE ARTHROSCOPY WITH DEBRIDEMENT/SHAVING, MEDIAL MENISECTOMY;  Surgeon: Renette Butters, MD;  Location: [facility];  Service: Orthopedics;  Laterality: Right;   Family History  Problem Relation Age of Onset  . Diabetes Other   . Hypertension Other   . Breast cancer Other   . Cancer Other     breast   . Hypertension Mother   . Colon cancer Neg Hx   . Esophageal cancer Neg Hx   . Stomach cancer Neg Hx   . Rectal cancer Neg Hx    Social History   Occupational History  . 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 Smoking status: Never Smoker   . Smokeless tobacco: Never Used  . Alcohol  Use: Yes     Comment: social  . Drug Use: No  . Sexual Activity: Yes    Tobacco Counseling Counseling given: Not Answered   Activities of Daily Living In your present state of health, do you have any difficulty performing the following activities: 06/27/2015  Hearing? N  Vision? N  Difficulty concentrating or making decisions? N  Walking or climbing stairs? N  Dressing or bathing? N  Doing errands, shopping? N  Preparing Food and eating ? N  Using the Toilet? N  In the past six months, have you accidently leaked urine? N  Do you have problems with loss of bowel control? N  Managing your Medications? N  Managing your Finances? N  Housekeeping or managing your Housekeeping?  N    Immunizations and Health Maintenance Immunization History  Administered Date(s) Administered  . Influenza Split 08/03/2011, 09/09/2012  . Influenza Whole 07/05/2010  . Influenza,inj,Quad PF,36+ Mos 07/10/2013, 07/28/2014  . Pneumococcal Conjugate-13 03/31/2015  . Pneumococcal Polysaccharide-23 11/15/2014  . Tdap 07/02/2012  . Zoster 07/15/2012   Health Maintenance Due  Topic Date Due  . Hepatitis C Screening  02-28-1951  . HIV Screening  07/12/1964  . DEXA SCAN  07/12/2014  . INFLUENZA VACCINE  06/13/2015    Patient Care Team: Cassandria Anger, MD as PCP - General Lafayette Dragon, MD (Gastroenterology) Justice Britain, MD (Orthopedic Surgery) Selinda Orion, MD as Surgeon (Obstetrics and Gynecology) Selinda Orion, MD as Consulting Physician (Gynecology) Morene Crocker, NP as Nurse Practitioner (Gynecology)  Indicate any recent Medical Services you may have received from other than [facility] providers in the past year (date may be approximate).     Assessment:   This is a routine wellness examination for Royelle.  Pt determined a personalized goal of weight loss and is enjoying [facility] program. Exercises 4 days a week; sliver sneaker and tops goes for walk in mall x 2 days x 60 minutes; Will be  starting a bowling league. No history of smoking. The patient will discuss need for bone density with Dr. Alain Marion this week.  Will also discuss norvasc and noted ankle swelling during the day at next apt. Continues to take 2 maxide per order; BP 140 / 70 today and this was a second reading; Will try to check a couple of times prior to apt with Plontnikov at the end of the week.  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 Vaccines up to date. Cognition assessed by AD8; Score 0/MMSE deferred as the patient stated they had no memory issues; No identified risk were noted; The patient was oriented x 3; appropriate in dress and manner and no objective failures at ADL's or IADL's.  Long term plan to age in home discussed   Negative for falls, depression, or incontinence of bowel or bladder   Did agree to take information on Advanced Directives home. Care team updated     Hearing/Vision screen  Hearing Screening   125Hz  250Hz  500Hz  1000Hz  2000Hz  4000Hz  8000Hz   Right ear:      100   Left ear:      100     Dietary issues and exercise activities discussed: Current Exercise Habits:: Structured exercise class, Type of exercise: Other - see comments, Time (Minutes): 60 (up 75% up and about/ wants to bowl; walks at th emall), Frequency (Times/Week): 4, Weekly Exercise (Minutes/Week): 120, Intensity: Mild  Goals    . Weight < 160 lb (72.576 kg)     Joined [facility] and plans to lose to 160; [facility] has great social support and calorie tracks;        Depression Screen PHQ 2/9 Scores 06/27/2015  PHQ - 2 Score 0    Fall Risk Fall Risk  06/27/2015  Falls in the past year? No  Risk for fall due to : Impaired mobility  Risk for fall due to (comments): right knee sitll hurts; but is not limiting at this time    Cognitive Function: MMSE - Mini Mental State Exam 06/27/2015  Not completed: Unable to  complete    Screening Tests Health Maintenance  Topic Date Due  . Hepatitis C Screening  05/06/1951  . HIV Screening  07/12/1964  . DEXA SCAN  07/12/2014  . INFLUENZA VACCINE  06/13/2015  . MAMMOGRAM  10/11/2016  . TETANUS/TDAP  07/02/2022  . COLONOSCOPY  12/24/2023  . ZOSTAVAX  Completed  . PNA vac Low Risk Adult  Completed      Plan:     Plan: 1.Will review for Advanced Directive; [place] offers free advance directive forms, as well as assistance in completing the forms themselves. For assistance, contact the Spiritual Care Department at [phone], or the Clinical Social Work Department at [phone]. 2; will plan to discuss Dexa scan with Dr. Alain Marion 3. Will fup on norvasc for ankle edema/ BP 140/70 and will try to get a couple of readings prior to apt on Friday with Dr. Alain Marion 4. Will get mammogram this fall in October / order was placed  Also will take flu shot at next apt. If available on Friday   During the course of the visit, Dilpreet was educated and counseled about the following appropriate screening and preventive services:   Vaccines to include Pneumoccal, Influenza, Hepatitis B, Td, Zostavax, HCV/ up to date  Electrocardiogram 05/2014  Cardiovascular disease screening/ n/a  Colorectal cancer screening/ Due 12/2023   Bone density screening: DUE and to discuss with Dr. Alain Marion  Diabetes screening/ educated and reviewed goals  Glaucoma screening/ neg per the patient  Mammography/PAP; deferred to GYN; but did decide to have mammogra this year and [facility]  Nutrition counseling/ currently going to [facility]  Smoking cessation counseling/ n/a , never smoked  Patient Instructions (the written plan) were given to the patient.    Wynetta Fines, RN   06/27/2015   Medical screening examination/treatment/procedure(s) were performed by non-physician practitioner and as supervising physician I was immediately available for  consultation/collaboration. I agree with above. [name], MD

## 2015-06-27 NOTE — Telephone Encounter (Signed)
Couldn't locate script so i call rx into pharmacy spoke with [facility] gave md approval.../lmb

## 2015-07-01 ENCOUNTER — Ambulatory Visit (INDEPENDENT_AMBULATORY_CARE_PROVIDER_SITE_OTHER): Payer: Medicare Other | Admitting: Internal Medicine

## 2015-07-01 ENCOUNTER — Other Ambulatory Visit (INDEPENDENT_AMBULATORY_CARE_PROVIDER_SITE_OTHER): Payer: Medicare Other

## 2015-07-01 ENCOUNTER — Other Ambulatory Visit: Payer: Self-pay | Admitting: *Deleted

## 2015-07-01 ENCOUNTER — Encounter: Payer: Self-pay | Admitting: Internal Medicine

## 2015-07-01 VITALS — BP 110/64 | HR 67 | Wt 172.0 lb

## 2015-07-01 DIAGNOSIS — I10 Essential (primary) hypertension: Secondary | ICD-10-CM | POA: Diagnosis not present

## 2015-07-01 DIAGNOSIS — E538 Deficiency of other specified B group vitamins: Secondary | ICD-10-CM

## 2015-07-01 LAB — BASIC METABOLIC PANEL
BUN: 29 mg/dL — AB (ref 6–23)
CHLORIDE: 102 meq/L (ref 96–112)
CO2: 29 mEq/L (ref 19–32)
CREATININE: 1.81 mg/dL — AB (ref 0.40–1.20)
Calcium: 9.9 mg/dL (ref 8.4–10.5)
GFR: 35.98 mL/min — AB (ref >60.00)
Glucose, Bld: 109 mg/dL — ABNORMAL HIGH (ref 70–99)
POTASSIUM: 3.6 meq/L (ref 3.5–5.1)
Sodium: 141 mEq/L (ref 135–145)

## 2015-07-01 MED ORDER — TRIAMTERENE-HCTZ 37.5-25 MG PO TABS: 1.0000 | ORAL_TABLET | Freq: Every day | ORAL | Status: DC

## 2015-07-01 MED ORDER — POTASSIUM CHLORIDE CRYS ER 10 MEQ PO TBCR: EXTENDED_RELEASE_TABLET | ORAL | Status: DC

## 2015-07-01 MED ORDER — AMLODIPINE BESYLATE 10 MG PO TABS: 5.0000 mg | ORAL_TABLET | Freq: Every day | ORAL | Status: DC

## 2015-07-01 NOTE — Assessment & Plan Note (Signed)
On B12 

## 2015-07-01 NOTE — Assessment & Plan Note (Signed)
Better w/wt loss Amlodipine -reduce to 5 mg/d, Losartan, Triamt-HCTZ - reduce to 1 a day

## 2015-07-01 NOTE — Progress Notes (Signed)
Subjective:  Patient ID: Jane Mooney, female    DOB: [birth year]  Age: 64 y.o. MRN: [id number]  CC: No chief complaint on file.   HPI Jane Mooney presents for HTN. B12 def, allergies f/u. Loosing wt on diet - 20 lbs  Outpatient Prescriptions Prior to Visit  Medication Sig Dispense Refill  . amLODipine (NORVASC) 10 MG tablet Take 1 tablet (10 mg total) by mouth daily. 90 tablet 3  . Cholecalciferol (VITAMIN D3) 1000 UNITS tablet Take 1,000 Units by mouth daily.      . Clobetasol Propionate Emulsion 0.05 % topical foam Apply topically See admin instructions.  1  . Cyanocobalamin (VITAMIN B-12) 1000 MCG SUBL Place 1 tablet (1,000 mcg total) under the tongue daily. 100 tablet 3  . ferrous sulfate (FERRO-BOB) 325 (65 FE) MG tablet Take 325 mg by mouth daily. For anemia     . fish oil-omega-3 fatty acids 1000 MG capsule Take 1 g by mouth daily.      . fluticasone (FLONASE) 50 MCG/ACT nasal spray Place 2 sprays into both nostrils 2 (two) times daily. Decrease to 2 sprays/nostril daily after 5 days 16 g 2  . hydrOXYzine (ATARAX/VISTARIL) 25 MG tablet TAKE 1 TABLET BY MOUTH EVERY 8 HOURS AS NEEDED 90 tablet 2  . loratadine (CLARITIN) 10 MG tablet Take 1 tablet (10 mg total) by mouth daily. 100 tablet 3  . LORazepam (ATIVAN) 1 MG tablet TAKE 1 TABLET BY MOUTH TWICE A DAY AS NEEDED FOR ANXIETY 60 tablet 3  . losartan (COZAAR) 100 MG tablet Take 1 tablet (100 mg total) by mouth daily. 90 tablet 3  . potassium chloride (KLOR-CON M10) 10 MEQ tablet TAKE 1 TABLET DAILY 30 tablet 11  . traMADol (ULTRAM) 50 MG tablet Take 1-2 tablets (50-100 mg total) by mouth 2 (two) times daily as needed for severe pain. 100 tablet 3  . triamterene-hydrochlorothiazide (MAXZIDE-25) 37.5-25 MG per tablet Take 2 tablets by mouth daily. 60 tablet 11  . hydrocortisone (ANUSOL-HC) 2.5 % rectal cream Apply rectally 2 times daily     Facility-Administered Medications Prior to Visit  Medication Dose Route Frequency Provider  Last Rate Last Dose  . methylPREDNISolone acetate (DEPO-MEDROL) injection 80 mg  80 mg Intra-articular Once Avamarie Crossley V, MD        ROS Review of Systems  Constitutional: Negative for chills, activity change, appetite change, fatigue and unexpected weight change.  HENT: Negative for congestion, mouth sores and sinus pressure.   Eyes: Negative for visual disturbance.  Respiratory: Negative for cough and chest tightness.   Gastrointestinal: Negative for nausea and abdominal pain.  Genitourinary: Negative for frequency, difficulty urinating and vaginal pain.  Musculoskeletal: Positive for arthralgias. Negative for back pain and gait problem.  Skin: Negative for pallor and rash.  Neurological: Negative for dizziness, tremors, weakness, numbness and headaches.  Psychiatric/Behavioral: Negative for confusion and sleep disturbance.    Objective:  BP 110/64 mmHg  Pulse 67  Wt 172 lb (78.019 kg)  SpO2 97%  BP Readings from Last 3 Encounters:  07/01/15 110/64  06/27/15 140/70  03/31/15 104/54    Wt Readings from Last 3 Encounters:  07/01/15 172 lb (78.019 kg)  06/27/15 175 lb 8 oz (79.606 kg)  03/31/15 182 lb 8 oz (82.781 kg)    Physical Exam  Constitutional: She appears well-developed. No distress.  HENT:  Head: Normocephalic.  Right Ear: External ear normal.  Left Ear: External ear normal.  Nose: Nose normal.  Mouth/Throat: Oropharynx  is clear and moist.  Eyes: Conjunctivae are normal. Pupils are equal, round, and reactive to light. Right eye exhibits no discharge. Left eye exhibits no discharge.  Neck: Normal range of motion. Neck supple. No JVD present. No tracheal deviation present. No thyromegaly present.  Cardiovascular: Normal rate, regular rhythm and normal heart sounds.   Pulmonary/Chest: No stridor. No respiratory distress. She has no wheezes.  Abdominal: Soft. Bowel sounds are normal. She exhibits no distension and no mass. There is no tenderness. There is no  rebound and no guarding.  Musculoskeletal: She exhibits no edema or tenderness.  Lymphadenopathy:    She has no cervical adenopathy.  Neurological: She displays normal reflexes. No cranial nerve deficit. She exhibits normal muscle tone. Coordination normal.  Skin: No rash noted. No erythema.  Psychiatric: She has a normal mood and affect. Her behavior is normal. Judgment and thought content normal.    Lab Results  Component Value Date   WBC 5.7 07/28/2014   HGB 12.5 07/28/2014   HCT 38.6 07/28/2014   PLT 241.0 07/28/2014   GLUCOSE 109* 07/28/2014   CHOL 222* 07/06/2013   TRIG 160.0* 07/06/2013   HDL 48.20 07/06/2013   LDLDIRECT 153.6 07/06/2013   ALT 13 07/28/2014   AST 16 07/28/2014   NA 139 07/28/2014   K 3.9 07/28/2014   CL 102 07/28/2014   CREATININE 0.9 07/28/2014   BUN 10 07/28/2014   CO2 31 07/28/2014   TSH 0.79 07/06/2013   HGBA1C 5.8 07/28/2014    Mm Screening Breast Tomo Bilateral  10/11/2014   CLINICAL DATA:  Screening.  EXAM: DIGITAL SCREENING BILATERAL MAMMOGRAM WITH 3D TOMO WITH CAD  COMPARISON:  Previous exam(s).  ACR Breast Density Category b: There are scattered areas of fibroglandular density.  FINDINGS: There are no findings suspicious for malignancy. Images were processed with CAD.  IMPRESSION: No mammographic evidence of malignancy. A result letter of this screening mammogram will be mailed directly to the patient.  RECOMMENDATION: Screening mammogram in one year. (Code:SM-B-01Y)  BI-RADS CATEGORY  1: Negative.   Electronically Signed   By: Lillia Mountain M.D.   On: 10/11/2014 11:49    Assessment & Plan:   There are no diagnoses linked to this encounter. I am having Ms. Juniel maintain her ferrous sulfate, fish oil-omega-3 fatty acids, cholecalciferol, Vitamin B-12, hydrocortisone, loratadine, fluticasone, potassium chloride, triamterene-hydrochlorothiazide, hydrOXYzine, Clobetasol Propionate Emulsion, amLODipine, losartan, traMADol, and LORazepam. We will  continue to administer methylPREDNISolone acetate.  No orders of the defined types were placed in this encounter.     Follow-up: No Follow-up on file.  [name], MD

## 2015-07-01 NOTE — Progress Notes (Signed)
Pre visit review using our clinic review tool, if applicable. No additional management support is needed unless otherwise documented below in the visit note. 

## 2015-07-05 ENCOUNTER — Other Ambulatory Visit: Payer: Self-pay | Admitting: *Deleted

## 2015-07-05 DIAGNOSIS — N289 Disorder of kidney and ureter, unspecified: Secondary | ICD-10-CM

## 2015-07-05 DIAGNOSIS — I10 Essential (primary) hypertension: Secondary | ICD-10-CM

## 2015-07-06 ENCOUNTER — Encounter: Payer: Self-pay | Admitting: Internal Medicine

## 2015-07-06 ENCOUNTER — Ambulatory Visit (INDEPENDENT_AMBULATORY_CARE_PROVIDER_SITE_OTHER): Payer: Medicare Other | Admitting: Internal Medicine

## 2015-07-06 VITALS — BP 130/80 | HR 82 | Temp 98.9°F | Resp 18 | Wt 172.0 lb

## 2015-07-06 DIAGNOSIS — J209 Acute bronchitis, unspecified: Secondary | ICD-10-CM

## 2015-07-06 MED ORDER — HYDROCODONE-HOMATROPINE 5-1.5 MG/5ML PO SYRP: 5.0000 mL | ORAL_SOLUTION | Freq: Four times a day (QID) | ORAL | Status: DC | PRN

## 2015-07-06 MED ORDER — AZITHROMYCIN 250 MG PO TABS: ORAL_TABLET | ORAL | Status: DC

## 2015-07-06 NOTE — Patient Instructions (Signed)

## 2015-07-06 NOTE — Progress Notes (Signed)
   Subjective:    Patient ID: Jane Mooney, female    DOB: Mar 18, 1951, 64 y.o.   MRN: [id number]  HPI  Her symptoms began 07/02/15 as sore throat followed by nonproductive cough. As of 8/22 the cough was productive of clear sputum. The cough is described as severe and paroxysmal. It was associated with vomiting last night. She also has some associated hoarseness. Her temp was 99.4 last night associated with chills. She does experience some postnasal drainage and watery eyes. There is paranasal pressure as well.  She took ibuprofen 600 mg last night for fever and chills without significant response. She treated the chills by employing a heating blanket.    Review of Systems Frontal headache, facial pain , nasal purulence, dental pain, sore throat , otic pain or otic discharge denied. No fever , chills or sweats.  The cough is not associated with wheezing.      Objective:   Physical Exam  Paroxysms of nonproductive cough. There is marked erythema of the nasal mucosa. She has upper and lower partials.  General appearance:Adequately nourished; no acute distress or increased work of breathing is present.    Lymphatic: No  lymphadenopathy about the head, neck, or axilla .  Eyes: No conjunctival inflammation or lid edema is present. There is no scleral icterus.  Ears:  External ear exam shows no significant lesions or deformities.  Otoscopic examination reveals clear canals, tympanic membranes are intact bilaterally without bulging, retraction, inflammation or discharge.  Nose:  External nasal examination shows no deformity or inflammation. No septal dislocation or deviation.No obstruction to airflow.   Oral exam: There is no oropharyngeal erythema or exudate .  Neck:  No deformities, thyromegaly, masses, or tenderness noted.   Supple with full range of motion without pain.   Heart:  Normal rate and regular rhythm. S1 and S2 normal without gallop, murmur, click, rub or other extra  sounds.   Lungs:Chest clear to auscultation; no wheezes, rhonchi,rales ,or rubs present.  Extremities:  No cyanosis, edema, or clubbing  noted    Skin: Warm & dry w/o tenting or jaundice. No significant lesions or rash.       Assessment & Plan:  #1 acute bronchitis w/o bronchospasm #2 rhinitis Plan: See orders and recommendations

## 2015-07-06 NOTE — Progress Notes (Signed)
Pre visit review using our clinic review tool, if applicable. No additional management support is needed unless otherwise documented below in the visit note. 

## 2015-07-12 ENCOUNTER — Other Ambulatory Visit (INDEPENDENT_AMBULATORY_CARE_PROVIDER_SITE_OTHER): Payer: Medicare Other

## 2015-07-12 DIAGNOSIS — I10 Essential (primary) hypertension: Secondary | ICD-10-CM

## 2015-07-12 DIAGNOSIS — N289 Disorder of kidney and ureter, unspecified: Secondary | ICD-10-CM

## 2015-07-12 LAB — BASIC METABOLIC PANEL
BUN: 16 mg/dL (ref 6–23)
CALCIUM: 9.6 mg/dL (ref 8.4–10.5)
CO2: 32 mEq/L (ref 19–32)
CREATININE: 1.18 mg/dL (ref 0.40–1.20)
Chloride: 103 mEq/L (ref 96–112)
GFR: 58.94 mL/min — ABNORMAL LOW (ref >60.00)
Glucose, Bld: 104 mg/dL — ABNORMAL HIGH (ref 70–99)
Potassium: 4 mEq/L (ref 3.5–5.1)
Sodium: 141 mEq/L (ref 135–145)

## 2015-07-19 ENCOUNTER — Ambulatory Visit (INDEPENDENT_AMBULATORY_CARE_PROVIDER_SITE_OTHER)
Admission: RE | Admit: 2015-07-19 | Discharge: 2015-07-19 | Disposition: A | Discharge status: Home / Self Care | Payer: Medicare Other | Source: Ambulatory Visit | Attending: Internal Medicine | Admitting: Internal Medicine

## 2015-07-19 ENCOUNTER — Encounter: Payer: Self-pay | Admitting: Internal Medicine

## 2015-07-19 ENCOUNTER — Ambulatory Visit (INDEPENDENT_AMBULATORY_CARE_PROVIDER_SITE_OTHER): Payer: Medicare Other | Admitting: Internal Medicine

## 2015-07-19 VITALS — BP 120/70 | HR 60 | Temp 98.5°F | Wt 170.0 lb

## 2015-07-19 DIAGNOSIS — J069 Acute upper respiratory infection, unspecified: Secondary | ICD-10-CM

## 2015-07-19 MED ORDER — LEVOFLOXACIN 500 MG PO TABS: 500.0000 mg | ORAL_TABLET | Freq: Every day | ORAL | Status: DC

## 2015-07-19 MED ORDER — PROMETHAZINE-CODEINE 6.25-10 MG/5ML PO SYRP: 5.0000 mL | ORAL_SOLUTION | ORAL | Status: DC | PRN

## 2015-07-19 NOTE — Progress Notes (Signed)
Pre visit review using our clinic review tool, if applicable. No additional management support is needed unless otherwise documented below in the visit note. 

## 2015-07-19 NOTE — Assessment & Plan Note (Signed)
8/16 refractory CXR Abx - Levaquin Prom/cod

## 2015-07-21 NOTE — Progress Notes (Signed)
Subjective:  Patient ID: Jane Mooney, female    DOB: Dec 02, 1950  Age: 64 y.o. MRN: [id number]  CC: Cough   HPI ZEA KOSTKA presents for URI sx's, bad cough x 2 weeks. Pt took Early - not better.  Outpatient Prescriptions Prior to Visit  Medication Sig Dispense Refill  . amLODipine (NORVASC) 10 MG tablet Take 0.5 tablets (5 mg total) by mouth daily. 90 tablet 3  . Cholecalciferol (VITAMIN D3) 1000 UNITS tablet Take 1,000 Units by mouth daily.      . Clobetasol Propionate Emulsion 0.05 % topical foam Apply topically See admin instructions.  1  . Cyanocobalamin (VITAMIN B-12) 1000 MCG SUBL Place 1 tablet (1,000 mcg total) under the tongue daily. 100 tablet 3  . ferrous sulfate (FERRO-BOB) 325 (65 FE) MG tablet Take 325 mg by mouth daily. For anemia     . fish oil-omega-3 fatty acids 1000 MG capsule Take 1 g by mouth daily.      . fluticasone (FLONASE) 50 MCG/ACT nasal spray Place 2 sprays into both nostrils 2 (two) times daily. Decrease to 2 sprays/nostril daily after 5 days 16 g 2  . hydrOXYzine (ATARAX/VISTARIL) 25 MG tablet TAKE 1 TABLET BY MOUTH EVERY 8 HOURS AS NEEDED 90 tablet 2  . loratadine (CLARITIN) 10 MG tablet Take 1 tablet (10 mg total) by mouth daily. 100 tablet 3  . LORazepam (ATIVAN) 1 MG tablet TAKE 1 TABLET BY MOUTH TWICE A DAY AS NEEDED FOR ANXIETY 60 tablet 3  . losartan (COZAAR) 100 MG tablet Take 1 tablet (100 mg total) by mouth daily. 90 tablet 3  . potassium chloride (KLOR-CON M10) 10 MEQ tablet TAKE 1 TABLET DAILY 30 tablet 11  . traMADol (ULTRAM) 50 MG tablet Take 1-2 tablets (50-100 mg total) by mouth 2 (two) times daily as needed for severe pain. 100 tablet 3  . triamterene-hydrochlorothiazide (MAXZIDE-25) 37.5-25 MG per tablet Take 1 tablet by mouth daily. 90 tablet 3  . azithromycin (ZITHROMAX Z-PAK) 250 MG tablet 2 day 1, then 1 qd 6 tablet 0  . HYDROcodone-homatropine (HYDROMET) 5-1.5 MG/5ML syrup Take 5 mLs by mouth every 6 (six) hours as needed for cough.  120 mL 0  . hydrocortisone (ANUSOL-HC) 2.5 % rectal cream Apply rectally 2 times daily     Facility-Administered Medications Prior to Visit  Medication Dose Route Frequency Provider Last Rate Last Dose  . methylPREDNISolone acetate (DEPO-MEDROL) injection 80 mg  80 mg Intra-articular Once Aleksei Plotnikov V, MD        ROS Review of Systems  Constitutional: Negative for chills, activity change, appetite change, fatigue and unexpected weight change.  HENT: Negative for congestion, mouth sores and sinus pressure.   Eyes: Negative for visual disturbance.  Respiratory: Positive for cough. Negative for choking, chest tightness and wheezing.   Gastrointestinal: Negative for nausea and abdominal pain.  Genitourinary: Negative for frequency, difficulty urinating and vaginal pain.  Musculoskeletal: Negative for back pain and gait problem.  Skin: Negative for pallor and rash.  Neurological: Negative for dizziness, tremors, weakness, numbness and headaches.  Psychiatric/Behavioral: Negative for confusion and sleep disturbance.    Objective:  BP 120/70 mmHg  Pulse 60  Temp(Src) 98.5 F (36.9 C) (Oral)  Wt 170 lb (77.111 kg)  SpO2 98%  BP Readings from Last 3 Encounters:  07/19/15 120/70  07/06/15 130/80  07/01/15 110/64    Wt Readings from Last 3 Encounters:  07/19/15 170 lb (77.111 kg)  07/06/15 172 lb (78.019 kg)  07/01/15 172 lb (78.019 kg)    Physical Exam  Constitutional: She appears well-developed. No distress.  HENT:  Head: Normocephalic.  Right Ear: External ear normal.  Left Ear: External ear normal.  Nose: Nose normal.  Mouth/Throat: Oropharynx is clear and moist.  Eyes: Conjunctivae are normal. Pupils are equal, round, and reactive to light. Right eye exhibits no discharge. Left eye exhibits no discharge.  Neck: Normal range of motion. Neck supple. No JVD present. No tracheal deviation present. No thyromegaly present.  Cardiovascular: Normal rate, regular rhythm and  normal heart sounds.   Pulmonary/Chest: No stridor. No respiratory distress. She has no wheezes.  Abdominal: Soft. Bowel sounds are normal. She exhibits no distension and no mass. There is no tenderness. There is no rebound and no guarding.  Musculoskeletal: She exhibits no edema or tenderness.  Lymphadenopathy:    She has no cervical adenopathy.  Neurological: She displays normal reflexes. No cranial nerve deficit. She exhibits normal muscle tone. Coordination normal.  Skin: No rash noted. No erythema.  Psychiatric: She has a normal mood and affect. Her behavior is normal. Judgment and thought content normal.  eryth throat  Lab Results  Component Value Date   WBC 5.7 07/28/2014   HGB 12.5 07/28/2014   HCT 38.6 07/28/2014   PLT 241.0 07/28/2014   GLUCOSE 104* 07/12/2015   CHOL 222* 07/06/2013   TRIG 160.0* 07/06/2013   HDL 48.20 07/06/2013   LDLDIRECT 153.6 07/06/2013   ALT 13 07/28/2014   AST 16 07/28/2014   NA 141 07/12/2015   K 4.0 07/12/2015   CL 103 07/12/2015   CREATININE 1.18 07/12/2015   BUN 16 07/12/2015   CO2 32 07/12/2015   TSH 0.79 07/06/2013   HGBA1C 5.8 07/28/2014    Dg Chest 2 View  07/19/2015   CLINICAL DATA:  Cough, chest pain and congestion for 3 weeks.  EXAM: CHEST  2 VIEW  COMPARISON:  PA and lateral chest 11/05/2009.  FINDINGS: Mild cardiomegaly is seen. Lungs are clear. Heart size is normal. No pneumothorax or pleural effusion. No focal bony abnormality.  IMPRESSION: Mild cardiomegaly without acute disease.   Electronically Signed   By: Inge Rise M.D.   On: 07/19/2015 16:48    Assessment & Plan:   Natosha was seen today for cough.  Diagnoses and all orders for this visit:  URI, acute -     DG Chest 2 View  Other orders -     levofloxacin (LEVAQUIN) 500 MG tablet; Take 1 tablet (500 mg total) by mouth daily. -     promethazine-codeine (PHENERGAN WITH CODEINE) 6.25-10 MG/5ML syrup; Take 5-10 mLs by mouth every 4 (four) hours as needed.   I  have discontinued Ms. Sones's HYDROcodone-homatropine and azithromycin. I am also having her start on levofloxacin and promethazine-codeine. Additionally, I am having her maintain her ferrous sulfate, fish oil-omega-3 fatty acids, cholecalciferol, Vitamin B-12, hydrocortisone, loratadine, fluticasone, hydrOXYzine, Clobetasol Propionate Emulsion, losartan, traMADol, LORazepam, amLODipine, triamterene-hydrochlorothiazide, and potassium chloride. We will continue to administer methylPREDNISolone acetate.  Meds ordered this encounter  Medications  . levofloxacin (LEVAQUIN) 500 MG tablet    Sig: Take 1 tablet (500 mg total) by mouth daily.    Dispense:  10 tablet    Refill:  0  . promethazine-codeine (PHENERGAN WITH CODEINE) 6.25-10 MG/5ML syrup    Sig: Take 5-10 mLs by mouth every 4 (four) hours as needed.    Dispense:  300 mL    Refill:  0     Follow-up:  No Follow-up on file.  [name], MD

## 2015-08-18 ENCOUNTER — Encounter (HOSPITAL_COMMUNITY): Payer: Self-pay | Admitting: Emergency Medicine

## 2015-08-18 ENCOUNTER — Emergency Department (HOSPITAL_COMMUNITY)
Admission: EM | Admit: 2015-08-18 | Discharge: 2015-08-18 | Disposition: A | Discharge status: Home / Self Care | Payer: Medicare Other | Attending: Emergency Medicine | Admitting: Emergency Medicine

## 2015-08-18 DIAGNOSIS — R21 Rash and other nonspecific skin eruption: Secondary | ICD-10-CM | POA: Diagnosis present

## 2015-08-18 DIAGNOSIS — Z7951 Long term (current) use of inhaled steroids: Secondary | ICD-10-CM | POA: Insufficient documentation

## 2015-08-18 DIAGNOSIS — I10 Essential (primary) hypertension: Secondary | ICD-10-CM | POA: Insufficient documentation

## 2015-08-18 DIAGNOSIS — Z8639 Personal history of other endocrine, nutritional and metabolic disease: Secondary | ICD-10-CM | POA: Insufficient documentation

## 2015-08-18 DIAGNOSIS — Z792 Long term (current) use of antibiotics: Secondary | ICD-10-CM | POA: Diagnosis not present

## 2015-08-18 DIAGNOSIS — Z79899 Other long term (current) drug therapy: Secondary | ICD-10-CM | POA: Insufficient documentation

## 2015-08-18 DIAGNOSIS — Z8719 Personal history of other diseases of the digestive system: Secondary | ICD-10-CM | POA: Diagnosis not present

## 2015-08-18 DIAGNOSIS — F419 Anxiety disorder, unspecified: Secondary | ICD-10-CM | POA: Diagnosis not present

## 2015-08-18 DIAGNOSIS — M1990 Unspecified osteoarthritis, unspecified site: Secondary | ICD-10-CM | POA: Insufficient documentation

## 2015-08-18 DIAGNOSIS — L259 Unspecified contact dermatitis, unspecified cause: Secondary | ICD-10-CM | POA: Diagnosis not present

## 2015-08-18 DIAGNOSIS — D649 Anemia, unspecified: Secondary | ICD-10-CM | POA: Diagnosis not present

## 2015-08-18 DIAGNOSIS — R011 Cardiac murmur, unspecified: Secondary | ICD-10-CM | POA: Insufficient documentation

## 2015-08-18 MED ORDER — TRIAMCINOLONE ACETONIDE 0.025 % EX OINT: 1.0000 "application " | TOPICAL_OINTMENT | Freq: Two times a day (BID) | CUTANEOUS | Status: DC

## 2015-08-18 NOTE — Discharge Instructions (Signed)

## 2015-08-18 NOTE — ED Provider Notes (Signed)
CSN: [id number]     Arrival date & time 08/18/15  1606 History   First MD Initiated Contact with Patient 08/18/15 1703     Chief Complaint  Patient presents with  . Rash     (Consider location/radiation/quality/duration/timing/severity/associated sxs/prior Treatment) HPI Comments: The patient complains of a rash to the left hairline behind the ear and on the base of the neck, she has not had any new topical products, hair products or any exposures to medications. She does note that she wore a new para of the earrings which were "cheaper". She has had some hydrocortisone cream which she has applied to the skin which has helped a small amount but the rash still persists. No associated cough, shortness of breath, swelling of the lips or the tongue, symptoms are mild and persistent.  Patient is a 64 y.o. female presenting with rash. The history is provided by the patient.  Rash Associated symptoms: no shortness of breath     Past Medical History  Diagnosis Date  . Anxiety 2010  . Depression   . HTN (hypertension)   . DJD (degenerative joint disease)     Left knee  . MVP (mitral valve prolapse)   . Symptomatic PVCs   . Allergic rhinitis   . LBP (low back pain)   . Hyperlipidemia   . Hemorrhoid   . Anemia   . Heart murmur   . Urticaria     recurrent  . PONV (postoperative nausea and vomiting)    Past Surgical History  Procedure Laterality Date  . Knee surgery  03/2007    Left Knee Arthr- Dr Onnie Graham  . Rotator cuff repair  2004    Left   . Abdominal hysterectomy  1984  . Tonsillectomy    . Hemorrhoid surgery    . Urtica    . Knee arthroscopy with medial menisectomy Right 05/21/2014    Procedure: RIGHT KNEE ARTHROSCOPY WITH DEBRIDEMENT/SHAVING, MEDIAL MENISECTOMY;  Surgeon: Renette Butters, MD;  Location: [facility];  Service: Orthopedics;  Laterality: Right;   Family History  Problem Relation Age of Onset  . Diabetes Other   . Hypertension Other   . Breast  cancer Other   . Cancer Other     breast   . Hypertension Mother   . Colon cancer Neg Hx   . Esophageal cancer Neg Hx   . Stomach cancer Neg Hx   . Rectal cancer Neg Hx    Social History  Substance Use Topics  . Smoking status: Never Smoker   . Smokeless tobacco: Never Used  . Alcohol Use: Yes     Comment: social   OB History    No data available     Review of Systems  Respiratory: Negative for cough and shortness of breath.   Skin: Positive for rash.      Allergies  Citalopram; Ibuprofen; Mirtazapine; and Quinapril hcl  Home Medications   Prior to Admission medications   Medication Sig Start Date End Date Taking? Authorizing Provider  amLODipine (NORVASC) 10 MG tablet Take 0.5 tablets (5 mg total) by mouth daily. 07/01/15   Aleksei Plotnikov V, MD  Cholecalciferol (VITAMIN D3) 1000 UNITS tablet Take 1,000 Units by mouth daily.      Historical Provider, MD  Clobetasol Propionate Emulsion 0.05 % topical foam Apply topically See admin instructions. 02/14/15   Historical Provider, MD  Cyanocobalamin (VITAMIN B-12) 1000 MCG SUBL Place 1 tablet (1,000 mcg total) under the tongue daily. 08/03/11  Aleksei Plotnikov V, MD  ferrous sulfate (FERRO-BOB) 325 (65 FE) MG tablet Take 325 mg by mouth daily. For anemia     Historical Provider, MD  fish oil-omega-3 fatty acids 1000 MG capsule Take 1 g by mouth daily.      Historical Provider, MD  fluticasone (FLONASE) 50 MCG/ACT nasal spray Place 2 sprays into both nostrils 2 (two) times daily. Decrease to 2 sprays/nostril daily after 5 days 09/18/14   Liam Graham, PA-C  hydrocortisone (ANUSOL-HC) 2.5 % rectal cream Apply rectally 2 times daily 05/03/11 04/16/14  Lew Dawes V, MD  hydrOXYzine (ATARAX/VISTARIL) 25 MG tablet TAKE 1 TABLET BY MOUTH EVERY 8 HOURS AS NEEDED 02/07/15   Cassandria Anger, MD  levofloxacin (LEVAQUIN) 500 MG tablet Take 1 tablet (500 mg total) by mouth daily. 07/19/15   Aleksei Plotnikov V, MD  loratadine  (CLARITIN) 10 MG tablet Take 1 tablet (10 mg total) by mouth daily. 01/18/14   Aleksei Plotnikov V, MD  LORazepam (ATIVAN) 1 MG tablet TAKE 1 TABLET BY MOUTH TWICE A DAY AS NEEDED FOR ANXIETY 06/26/15   Aleksei Plotnikov V, MD  losartan (COZAAR) 100 MG tablet Take 1 tablet (100 mg total) by mouth daily. 03/31/15   Aleksei Plotnikov V, MD  potassium chloride (KLOR-CON M10) 10 MEQ tablet TAKE 1 TABLET DAILY 07/01/15   Cassandria Anger, MD  promethazine-codeine (PHENERGAN WITH CODEINE) 6.25-10 MG/5ML syrup Take 5-10 mLs by mouth every 4 (four) hours as needed. 07/19/15   Aleksei Plotnikov V, MD  traMADol (ULTRAM) 50 MG tablet Take 1-2 tablets (50-100 mg total) by mouth 2 (two) times daily as needed for severe pain. 03/31/15   Aleksei Plotnikov V, MD  triamcinolone (KENALOG) 0.025 % ointment Apply 1 application topically 2 (two) times daily. Do not apply to face 08/18/15   Noemi Chapel, MD  triamterene-hydrochlorothiazide (MAXZIDE-25) 37.5-25 MG per tablet Take 1 tablet by mouth daily. 07/01/15   Aleksei Plotnikov V, MD   BP 155/70 mmHg  Pulse 60  Temp(Src) 98.2 F (36.8 C) (Oral)  Resp 20  SpO2 100% Physical Exam  Constitutional: She appears well-developed and well-nourished.  HENT:  Head: Normocephalic and atraumatic.  Oropharynx clear, there is a erythematous papular rash along the hairline on the left and a small amount on the right, no pustules, no vesicles, no petechiae or purpura  Eyes: Conjunctivae are normal. Right eye exhibits no discharge. Left eye exhibits no discharge.  Pulmonary/Chest: Effort normal. No respiratory distress.  Neurological: She is alert. Coordination normal.  Skin: Skin is warm and dry. No rash noted. She is not diaphoretic. No erythema.  Psychiatric: She has a normal mood and affect.  Nursing note and vitals reviewed.   ED Course  Procedures (including critical care time) Labs Review Labs Reviewed - No data to display  Imaging Review No results found. I have  personally reviewed and evaluated these images and lab results as part of my medical decision-making.   EKG Interpretation None      MDM   Final diagnoses:  Contact dermatitis    Contact dermatitis most likely, change to triamcinolone cream, can follow up outpatient. Mild contact dermatitis   Meds given in ED:  Medications - No data to display  New Prescriptions   TRIAMCINOLONE (KENALOG) 0.025 % OINTMENT    Apply 1 application topically 2 (two) times daily. Do not apply to face      Noemi Chapel, MD 08/18/15 [phone]

## 2015-08-18 NOTE — ED Notes (Signed)
Pt states she started experiencing a red/itching rash to the base of her hairline/top of her neck that started yesterday. States she's used a couple new products, not sure if she could be having a reaction. Denies pain to rash. Mild erythema visible. States she put some cream on it this morning and took a benadryl around 0800 this am.

## 2015-09-29 ENCOUNTER — Ambulatory Visit (INDEPENDENT_AMBULATORY_CARE_PROVIDER_SITE_OTHER): Payer: Medicare Other | Admitting: Internal Medicine

## 2015-09-29 ENCOUNTER — Encounter: Payer: Self-pay | Admitting: Internal Medicine

## 2015-09-29 VITALS — BP 116/60 | HR 61 | Wt 175.0 lb

## 2015-09-29 DIAGNOSIS — M25511 Pain in right shoulder: Secondary | ICD-10-CM | POA: Diagnosis not present

## 2015-09-29 DIAGNOSIS — Z23 Encounter for immunization: Secondary | ICD-10-CM

## 2015-09-29 DIAGNOSIS — M546 Pain in thoracic spine: Secondary | ICD-10-CM | POA: Insufficient documentation

## 2015-09-29 DIAGNOSIS — M170 Bilateral primary osteoarthritis of knee: Secondary | ICD-10-CM | POA: Insufficient documentation

## 2015-09-29 MED ORDER — METHYLPREDNISOLONE ACETATE 40 MG/ML IJ SUSP
40.0000 mg | Freq: Once | INTRAMUSCULAR | Status: AC
  Administered 2015-09-29: 40 mg via INTRAMUSCULAR

## 2015-09-29 MED ORDER — LORAZEPAM 1 MG PO TABS: 1.0000 mg | ORAL_TABLET | Freq: Two times a day (BID) | ORAL | Status: DC | PRN

## 2015-09-29 NOTE — Progress Notes (Signed)
Pre visit review using our clinic review tool, if applicable. No additional management support is needed unless otherwise documented below in the visit note. 

## 2015-09-29 NOTE — Assessment & Plan Note (Signed)
Trigger point inj 

## 2015-09-29 NOTE — Progress Notes (Signed)
Subjective:  Patient ID: Jane Mooney, female    DOB: [birth year]  Age: 64 y.o. MRN: [id number]  CC: No chief complaint on file.   HPI Jane Mooney presents for knee OA, back pain under R shoulder blade x 3 weeks, HTN f/u. Husband had a back surgery  Outpatient Prescriptions Prior to Visit  Medication Sig Dispense Refill  . amLODipine (NORVASC) 10 MG tablet Take 0.5 tablets (5 mg total) by mouth daily. 90 tablet 3  . Cholecalciferol (VITAMIN D3) 1000 UNITS tablet Take 1,000 Units by mouth daily.      . Cyanocobalamin (VITAMIN B-12) 1000 MCG SUBL Place 1 tablet (1,000 mcg total) under the tongue daily. 100 tablet 3  . ferrous sulfate (FERRO-BOB) 325 (65 FE) MG tablet Take 325 mg by mouth daily. For anemia     . fish oil-omega-3 fatty acids 1000 MG capsule Take 1 g by mouth daily.      . fluticasone (FLONASE) 50 MCG/ACT nasal spray Place 2 sprays into both nostrils 2 (two) times daily. Decrease to 2 sprays/nostril daily after 5 days 16 g 2  . hydrOXYzine (ATARAX/VISTARIL) 25 MG tablet TAKE 1 TABLET BY MOUTH EVERY 8 HOURS AS NEEDED 90 tablet 2  . loratadine (CLARITIN) 10 MG tablet Take 1 tablet (10 mg total) by mouth daily. 100 tablet 3  . losartan (COZAAR) 100 MG tablet Take 1 tablet (100 mg total) by mouth daily. 90 tablet 3  . potassium chloride (KLOR-CON M10) 10 MEQ tablet TAKE 1 TABLET DAILY 30 tablet 11  . traMADol (ULTRAM) 50 MG tablet Take 1-2 tablets (50-100 mg total) by mouth 2 (two) times daily as needed for severe pain. 100 tablet 3  . triamcinolone (KENALOG) 0.025 % ointment Apply 1 application topically 2 (two) times daily. Do not apply to face 15 g 1  . triamterene-hydrochlorothiazide (MAXZIDE-25) 37.5-25 MG per tablet Take 1 tablet by mouth daily. 90 tablet 3  . LORazepam (ATIVAN) 1 MG tablet TAKE 1 TABLET BY MOUTH TWICE A DAY AS NEEDED FOR ANXIETY 60 tablet 3  . Clobetasol Propionate Emulsion 0.05 % topical foam Apply topically See admin instructions.  1  . hydrocortisone  (ANUSOL-HC) 2.5 % rectal cream Apply rectally 2 times daily    . levofloxacin (LEVAQUIN) 500 MG tablet Take 1 tablet (500 mg total) by mouth daily. (Patient not taking: Reported on 09/29/2015) 10 tablet 0  . promethazine-codeine (PHENERGAN WITH CODEINE) 6.25-10 MG/5ML syrup Take 5-10 mLs by mouth every 4 (four) hours as needed. (Patient not taking: Reported on 09/29/2015) 300 mL 0   Facility-Administered Medications Prior to Visit  Medication Dose Route Frequency Provider Last Rate Last Dose  . methylPREDNISolone acetate (DEPO-MEDROL) injection 80 mg  80 mg Intra-articular Once Aleksei Plotnikov V, MD        ROS Review of Systems  Constitutional: Negative for chills, activity change, appetite change, fatigue and unexpected weight change.  HENT: Negative for congestion, mouth sores and sinus pressure.   Eyes: Negative for visual disturbance.  Respiratory: Negative for cough and chest tightness.   Gastrointestinal: Negative for nausea and abdominal pain.  Genitourinary: Negative for frequency, difficulty urinating and vaginal pain.  Musculoskeletal: Positive for back pain. Negative for gait problem.  Skin: Negative for pallor and rash.  Neurological: Negative for dizziness, tremors, weakness, numbness and headaches.  Psychiatric/Behavioral: Negative for suicidal ideas, confusion and sleep disturbance.    Objective:  BP 116/60 mmHg  Pulse 61  Wt 175 lb (79.379 kg)  SpO2 99%  BP Readings from Last 3 Encounters:  09/29/15 116/60  08/18/15 155/70  07/19/15 120/70    Wt Readings from Last 3 Encounters:  09/29/15 175 lb (79.379 kg)  07/19/15 170 lb (77.111 kg)  07/06/15 172 lb (78.019 kg)    Physical Exam  Constitutional: She appears well-developed. No distress.  HENT:  Head: Normocephalic.  Right Ear: External ear normal.  Left Ear: External ear normal.  Nose: Nose normal.  Mouth/Throat: Oropharynx is clear and moist.  Eyes: Conjunctivae are normal. Pupils are equal, round,  and reactive to light. Right eye exhibits no discharge. Left eye exhibits no discharge.  Neck: Normal range of motion. Neck supple. No JVD present. No tracheal deviation present. No thyromegaly present.  Cardiovascular: Normal rate, regular rhythm and normal heart sounds.   Pulmonary/Chest: No stridor. No respiratory distress. She has no wheezes.  Abdominal: Soft. Bowel sounds are normal. She exhibits no distension and no mass. There is no tenderness. There is no rebound and no guarding.  Musculoskeletal: She exhibits no edema or tenderness.  Lymphadenopathy:    She has no cervical adenopathy.  Neurological: She displays normal reflexes. No cranial nerve deficit. She exhibits normal muscle tone. Coordination normal.  Skin: No rash noted. No erythema.  Psychiatric: She has a normal mood and affect. Her behavior is normal. Judgment and thought content normal.  R posterior shoulder is tender  Lab Results  Component Value Date   WBC 5.7 07/28/2014   HGB 12.5 07/28/2014   HCT 38.6 07/28/2014   PLT 241.0 07/28/2014   GLUCOSE 104* 07/12/2015   CHOL 222* 07/06/2013   TRIG 160.0* 07/06/2013   HDL 48.20 07/06/2013   LDLDIRECT 153.6 07/06/2013   ALT 13 07/28/2014   AST 16 07/28/2014   NA 141 07/12/2015   K 4.0 07/12/2015   CL 103 07/12/2015   CREATININE 1.18 07/12/2015   BUN 16 07/12/2015   CO2 32 07/12/2015   TSH 0.79 07/06/2013   HGBA1C 5.8 07/28/2014     Procedure Note :    Trigger Point Injection: R shoulder thor spine and sh blade area   Indication : Focal tender area identifiable by the location without other identifiable neurologic or musculoskeletal finding or pathology.   Risks including unsuccessful procedure , bleeding, infection, bruising, skin atrophy and others were explained to the patient in detail as well as the benefits. Informed consent was obtained and signed.   Tthe patient was placed in a comfortable position.    points of maximum tenderness over paraspinal and  trapezius muscles were marked and  the skin was prepped with Betadine and alcohol. 1 inch 25-gauge needle was used. The needle was advanced perpendicular to the skin. Each trigger point was injected with 1 mL of 2% lidocaine and 10 mg of Depo-Medrol in a usual fashion.  Band-Aids applied.   Tolerated well. Complications: None. Good pain relief following the procedure.   No results found.  Assessment & Plan:   Diagnoses and all orders for this visit:  Right-sided thoracic back pain -     methylPREDNISolone acetate (DEPO-MEDROL) injection 40 mg; Inject 1 mL (40 mg total) into the muscle once.  Primary osteoarthritis of both knees  Need for influenza vaccination -     Flu Vaccine QUAD 36+ mos IM  Pain in joint of right shoulder  Other orders -     LORazepam (ATIVAN) 1 MG tablet; Take 1 tablet (1 mg total) by mouth 2 (two) times daily as needed for anxiety.  I  have discontinued Ms. Sacra's levofloxacin and promethazine-codeine. I have also changed her LORazepam. Additionally, I am having her maintain her ferrous sulfate, fish oil-omega-3 fatty acids, cholecalciferol, Vitamin B-12, hydrocortisone, loratadine, fluticasone, hydrOXYzine, Clobetasol Propionate Emulsion, losartan, traMADol, amLODipine, triamterene-hydrochlorothiazide, potassium chloride, and triamcinolone. We administered methylPREDNISolone acetate. We will continue to administer methylPREDNISolone acetate.  Meds ordered this encounter  Medications  . LORazepam (ATIVAN) 1 MG tablet    Sig: Take 1 tablet (1 mg total) by mouth 2 (two) times daily as needed for anxiety.    Dispense:  60 tablet    Refill:  5  . methylPREDNISolone acetate (DEPO-MEDROL) injection 40 mg    Sig:      Follow-up: Return in about 2 weeks (around 10/13/2015).  [name], MD

## 2015-10-09 ENCOUNTER — Encounter: Payer: Self-pay | Admitting: Internal Medicine

## 2015-10-09 MED ORDER — METHYLPREDNISOLONE ACETATE 40 MG/ML IJ SUSP: 40.0000 mg | Freq: Once | INTRAMUSCULAR | Status: DC

## 2015-10-10 ENCOUNTER — Ambulatory Visit: Payer: Medicare Other | Admitting: Internal Medicine

## 2015-10-14 ENCOUNTER — Other Ambulatory Visit: Payer: Self-pay

## 2015-10-14 DIAGNOSIS — Z1231 Encounter for screening mammogram for malignant neoplasm of breast: Secondary | ICD-10-CM

## 2015-10-18 ENCOUNTER — Encounter: Payer: Self-pay | Admitting: Internal Medicine

## 2015-10-18 ENCOUNTER — Ambulatory Visit (INDEPENDENT_AMBULATORY_CARE_PROVIDER_SITE_OTHER): Payer: Medicare Other | Admitting: Internal Medicine

## 2015-10-18 VITALS — BP 110/70 | HR 57 | Wt 173.0 lb

## 2015-10-18 DIAGNOSIS — I10 Essential (primary) hypertension: Secondary | ICD-10-CM | POA: Diagnosis not present

## 2015-10-18 DIAGNOSIS — E538 Deficiency of other specified B group vitamins: Secondary | ICD-10-CM

## 2015-10-18 DIAGNOSIS — M25561 Pain in right knee: Secondary | ICD-10-CM

## 2015-10-18 DIAGNOSIS — E559 Vitamin D deficiency, unspecified: Secondary | ICD-10-CM

## 2015-10-18 MED ORDER — HYDROXYZINE HCL 25 MG PO TABS: 25.0000 mg | ORAL_TABLET | Freq: Three times a day (TID) | ORAL | Status: DC | PRN

## 2015-10-18 MED ORDER — MELOXICAM 7.5 MG PO TABS: 7.5000 mg | ORAL_TABLET | Freq: Every day | ORAL | Status: DC | PRN

## 2015-10-18 NOTE — Assessment & Plan Note (Signed)
Vit D 

## 2015-10-18 NOTE — Assessment & Plan Note (Signed)
On B12 

## 2015-10-18 NOTE — Progress Notes (Signed)
Subjective:  Patient ID: Jane Mooney, female    DOB: July 21, 1951  Age: 64 y.o. MRN: [id number]  CC: No chief complaint on file.   HPI Jane Mooney presents for knee OA, HTN, anxiety f/u. R CP resolved  Outpatient Prescriptions Prior to Visit  Medication Sig Dispense Refill  . amLODipine (NORVASC) 10 MG tablet Take 0.5 tablets (5 mg total) by mouth daily. 90 tablet 3  . Cholecalciferol (VITAMIN D3) 1000 UNITS tablet Take 1,000 Units by mouth daily.      . Clobetasol Propionate Emulsion 0.05 % topical foam Apply topically See admin instructions.  1  . Cyanocobalamin (VITAMIN B-12) 1000 MCG SUBL Place 1 tablet (1,000 mcg total) under the tongue daily. 100 tablet 3  . ferrous sulfate (FERRO-BOB) 325 (65 FE) MG tablet Take 325 mg by mouth daily. For anemia     . fish oil-omega-3 fatty acids 1000 MG capsule Take 1 g by mouth daily.      . fluticasone (FLONASE) 50 MCG/ACT nasal spray Place 2 sprays into both nostrils 2 (two) times daily. Decrease to 2 sprays/nostril daily after 5 days 16 g 2  . loratadine (CLARITIN) 10 MG tablet Take 1 tablet (10 mg total) by mouth daily. 100 tablet 3  . LORazepam (ATIVAN) 1 MG tablet Take 1 tablet (1 mg total) by mouth 2 (two) times daily as needed for anxiety. 60 tablet 5  . losartan (COZAAR) 100 MG tablet Take 1 tablet (100 mg total) by mouth daily. 90 tablet 3  . potassium chloride (KLOR-CON M10) 10 MEQ tablet TAKE 1 TABLET DAILY 30 tablet 11  . traMADol (ULTRAM) 50 MG tablet Take 1-2 tablets (50-100 mg total) by mouth 2 (two) times daily as needed for severe pain. 100 tablet 3  . triamcinolone (KENALOG) 0.025 % ointment Apply 1 application topically 2 (two) times daily. Do not apply to face 15 g 1  . triamterene-hydrochlorothiazide (MAXZIDE-25) 37.5-25 MG per tablet Take 1 tablet by mouth daily. 90 tablet 3  . hydrOXYzine (ATARAX/VISTARIL) 25 MG tablet TAKE 1 TABLET BY MOUTH EVERY 8 HOURS AS NEEDED 90 tablet 2  . hydrocortisone (ANUSOL-HC) 2.5 % rectal  cream Apply rectally 2 times daily     Facility-Administered Medications Prior to Visit  Medication Dose Route Frequency Provider Last Rate Last Dose  . methylPREDNISolone acetate (DEPO-MEDROL) injection 40 mg  40 mg Intra-articular Once Jane Sayre V, MD      . methylPREDNISolone acetate (DEPO-MEDROL) injection 80 mg  80 mg Intra-articular Once Jane Juhasz V, MD        ROS Review of Systems  Constitutional: Negative for chills, activity change, appetite change, fatigue and unexpected weight change.  HENT: Negative for congestion, mouth sores and sinus pressure.   Eyes: Negative for visual disturbance.  Respiratory: Negative for cough and chest tightness.   Gastrointestinal: Negative for nausea and abdominal pain.  Genitourinary: Negative for frequency, difficulty urinating and vaginal pain.  Musculoskeletal: Positive for arthralgias and gait problem. Negative for back pain and joint swelling.  Skin: Negative for pallor and rash.  Neurological: Negative for dizziness, tremors, weakness, numbness and headaches.  Psychiatric/Behavioral: Negative for confusion and sleep disturbance.    Objective:  BP 110/70 mmHg  Pulse 57  Wt 173 lb (78.472 kg)  SpO2 98%  BP Readings from Last 3 Encounters:  10/18/15 110/70  09/29/15 116/60  08/18/15 155/70    Wt Readings from Last 3 Encounters:  10/18/15 173 lb (78.472 kg)  09/29/15 175 lb (79.379  kg)  07/19/15 170 lb (77.111 kg)    Physical Exam  Constitutional: She appears well-developed. No distress.  HENT:  Head: Normocephalic.  Right Ear: External ear normal.  Left Ear: External ear normal.  Nose: Nose normal.  Mouth/Throat: Oropharynx is clear and moist.  Eyes: Conjunctivae are normal. Pupils are equal, round, and reactive to light. Right eye exhibits no discharge. Left eye exhibits no discharge.  Neck: Normal range of motion. Neck supple. No JVD present. No tracheal deviation present. No thyromegaly present.    Cardiovascular: Normal rate, regular rhythm and normal heart sounds.   Pulmonary/Chest: No stridor. No respiratory distress. She has no wheezes.  Abdominal: Soft. Bowel sounds are normal. She exhibits no distension and no mass. There is no tenderness. There is no rebound and no guarding.  Musculoskeletal: She exhibits tenderness. She exhibits no edema.  Lymphadenopathy:    She has no cervical adenopathy.  Neurological: She displays normal reflexes. No cranial nerve deficit. She exhibits normal muscle tone. Coordination normal.  Skin: No rash noted. No erythema.  Psychiatric: She has a normal mood and affect. Her behavior is normal. Judgment and thought content normal.  Obese B knees are tender  Lab Results  Component Value Date   WBC 5.7 07/28/2014   HGB 12.5 07/28/2014   HCT 38.6 07/28/2014   PLT 241.0 07/28/2014   GLUCOSE 104* 07/12/2015   CHOL 222* 07/06/2013   TRIG 160.0* 07/06/2013   HDL 48.20 07/06/2013   LDLDIRECT 153.6 07/06/2013   ALT 13 07/28/2014   AST 16 07/28/2014   NA 141 07/12/2015   K 4.0 07/12/2015   CL 103 07/12/2015   CREATININE 1.18 07/12/2015   BUN 16 07/12/2015   CO2 32 07/12/2015   TSH 0.79 07/06/2013   HGBA1C 5.8 07/28/2014    No results found.  Assessment & Plan:   Diagnoses and all orders for this visit:  Essential hypertension  Vitamin B 12 deficiency  Arthralgia of right lower leg  Other orders -     hydrOXYzine (ATARAX/VISTARIL) 25 MG tablet; Take 1 tablet (25 mg total) by mouth every 8 (eight) hours as needed. -     meloxicam (MOBIC) 7.5 MG tablet; Take 1-2 tablets (7.5-15 mg total) by mouth daily as needed for pain.  I have changed Jane Mooney's hydrOXYzine. I am also having her start on meloxicam. Additionally, I am having her maintain her ferrous sulfate, fish oil-omega-3 fatty acids, cholecalciferol, Vitamin B-12, hydrocortisone, loratadine, fluticasone, Clobetasol Propionate Emulsion, losartan, traMADol, amLODipine,  triamterene-hydrochlorothiazide, potassium chloride, triamcinolone, and LORazepam. We will continue to administer methylPREDNISolone acetate and methylPREDNISolone acetate.  Meds ordered this encounter  Medications  . hydrOXYzine (ATARAX/VISTARIL) 25 MG tablet    Sig: Take 1 tablet (25 mg total) by mouth every 8 (eight) hours as needed.    Dispense:  90 tablet    Refill:  1  . meloxicam (MOBIC) 7.5 MG tablet    Sig: Take 1-2 tablets (7.5-15 mg total) by mouth daily as needed for pain.    Dispense:  60 tablet    Refill:  1     Follow-up: No Follow-up on file.  [name], MD

## 2015-10-18 NOTE — Assessment & Plan Note (Addendum)
Dr Percell Miller OA B knees; b.anserina bursitis B  Potential benefits of a long term opioids use as well as potential risks (i.e. addiction risk, apnea etc) and complications (i.e. Somnolence, constipation and others) were explained to the patient and were aknowledged.  Meloxicam prn Ice/rice bag

## 2015-10-18 NOTE — Progress Notes (Signed)
Pre visit review using our clinic review tool, if applicable. No additional management support is needed unless otherwise documented below in the visit note. 

## 2015-10-18 NOTE — Assessment & Plan Note (Signed)
Chronic Amlodipine, Losartan, Triamt-HCTZ

## 2015-10-28 LAB — IFOBT (OCCULT BLOOD): IMMUNOLOGICAL FECAL OCCULT BLOOD TEST: NEGATIVE

## 2015-12-07 ENCOUNTER — Other Ambulatory Visit: Payer: Self-pay | Admitting: Internal Medicine

## 2016-01-03 LAB — HM MAMMOGRAPHY

## 2016-01-17 ENCOUNTER — Ambulatory Visit: Payer: Medicare Other | Admitting: Internal Medicine

## 2016-02-07 ENCOUNTER — Other Ambulatory Visit (INDEPENDENT_AMBULATORY_CARE_PROVIDER_SITE_OTHER): Payer: Medicare Other

## 2016-02-07 ENCOUNTER — Ambulatory Visit (INDEPENDENT_AMBULATORY_CARE_PROVIDER_SITE_OTHER): Payer: Medicare Other | Admitting: Internal Medicine

## 2016-02-07 ENCOUNTER — Encounter: Payer: Self-pay | Admitting: Internal Medicine

## 2016-02-07 VITALS — BP 110/68 | HR 65 | Wt 173.0 lb

## 2016-02-07 DIAGNOSIS — E538 Deficiency of other specified B group vitamins: Secondary | ICD-10-CM | POA: Diagnosis not present

## 2016-02-07 DIAGNOSIS — M25561 Pain in right knee: Secondary | ICD-10-CM

## 2016-02-07 DIAGNOSIS — G8929 Other chronic pain: Secondary | ICD-10-CM

## 2016-02-07 DIAGNOSIS — M5440 Lumbago with sciatica, unspecified side: Secondary | ICD-10-CM

## 2016-02-07 DIAGNOSIS — I10 Essential (primary) hypertension: Secondary | ICD-10-CM | POA: Diagnosis not present

## 2016-02-07 DIAGNOSIS — E559 Vitamin D deficiency, unspecified: Secondary | ICD-10-CM | POA: Diagnosis not present

## 2016-02-07 LAB — BASIC METABOLIC PANEL
BUN: 19 mg/dL (ref 6–23)
CHLORIDE: 102 meq/L (ref 96–112)
CO2: 30 meq/L (ref 19–32)
Calcium: 9.7 mg/dL (ref 8.4–10.5)
Creatinine, Ser: 1.06 mg/dL (ref 0.40–1.20)
GFR: 66.58 mL/min (ref >60.00)
GLUCOSE: 106 mg/dL — AB (ref 70–99)
POTASSIUM: 4 meq/L (ref 3.5–5.1)
SODIUM: 140 meq/L (ref 135–145)

## 2016-02-07 LAB — HEPATIC FUNCTION PANEL
ALT: 11 U/L (ref 0–35)
AST: 17 U/L (ref 0–37)
Albumin: 4.1 g/dL (ref 3.5–5.2)
Alkaline Phosphatase: 72 U/L (ref 39–117)
BILIRUBIN DIRECT: 0.1 mg/dL (ref 0.0–0.3)
BILIRUBIN TOTAL: 0.5 mg/dL (ref 0.2–1.2)
Total Protein: 7.3 g/dL (ref 6.0–8.3)

## 2016-02-07 LAB — VITAMIN B12: VITAMIN B 12: 322 pg/mL (ref 211–911)

## 2016-02-07 LAB — VITAMIN D 25 HYDROXY (VIT D DEFICIENCY, FRACTURES): VITD: 23.39 ng/mL — ABNORMAL LOW (ref 30.00–100.00)

## 2016-02-07 MED ORDER — LOSARTAN POTASSIUM 100 MG PO TABS: 100.0000 mg | ORAL_TABLET | Freq: Every day | ORAL | Status: DC

## 2016-02-07 MED ORDER — ERGOCALCIFEROL 1.25 MG (50000 UT) PO CAPS: 50000.0000 [IU] | ORAL_CAPSULE | ORAL | Status: DC

## 2016-02-07 MED ORDER — TRIAMTERENE-HCTZ 37.5-25 MG PO TABS: 1.0000 | ORAL_TABLET | Freq: Every day | ORAL | Status: DC

## 2016-02-07 MED ORDER — AMLODIPINE BESYLATE 10 MG PO TABS: 5.0000 mg | ORAL_TABLET | Freq: Every day | ORAL | Status: DC

## 2016-02-07 MED ORDER — VITAMIN D3 25 MCG (1000 UNIT) PO TABS: 2000.0000 [IU] | ORAL_TABLET | Freq: Every day | ORAL | Status: AC

## 2016-02-07 MED ORDER — MELOXICAM 7.5 MG PO TABS: 7.5000 mg | ORAL_TABLET | Freq: Every day | ORAL | Status: DC | PRN

## 2016-02-07 NOTE — Assessment & Plan Note (Signed)
On Vit d 

## 2016-02-07 NOTE — Assessment & Plan Note (Signed)
Amlodipine, Losartan, Triamt-HCTZ 

## 2016-02-07 NOTE — Progress Notes (Signed)
Subjective:  Patient ID: Jane Mooney, female    DOB: 1951/09/17  Age: 66 y.o. MRN: [id number]  CC: No chief complaint on file.   HPI Jane Mooney presents for HTN, OA, vit B12 def f/u  Outpatient Prescriptions Prior to Visit  Medication Sig Dispense Refill  . Cholecalciferol (VITAMIN D3) 1000 UNITS tablet Take 1,000 Units by mouth daily.      . Cyanocobalamin (VITAMIN B-12) 1000 MCG SUBL Place 1 tablet (1,000 mcg total) under the tongue daily. 100 tablet 3  . ferrous sulfate (FERRO-BOB) 325 (65 FE) MG tablet Take 325 mg by mouth daily. For anemia     . fish oil-omega-3 fatty acids 1000 MG capsule Take 1 g by mouth daily.      . fluticasone (FLONASE) 50 MCG/ACT nasal spray Place 2 sprays into both nostrils 2 (two) times daily. Decrease to 2 sprays/nostril daily after 5 days 16 g 2  . hydrOXYzine (ATARAX/VISTARIL) 25 MG tablet Take 1 tablet (25 mg total) by mouth every 8 (eight) hours as needed. 90 tablet 1  . loratadine (CLARITIN) 10 MG tablet Take 1 tablet (10 mg total) by mouth daily. 100 tablet 3  . LORazepam (ATIVAN) 1 MG tablet Take 1 tablet (1 mg total) by mouth 2 (two) times daily as needed for anxiety. 60 tablet 5  . potassium chloride (KLOR-CON M10) 10 MEQ tablet TAKE 1 TABLET DAILY 30 tablet 11  . amLODipine (NORVASC) 10 MG tablet Take 0.5 tablets (5 mg total) by mouth daily. 90 tablet 3  . losartan (COZAAR) 100 MG tablet Take 1 tablet (100 mg total) by mouth daily. 90 tablet 3  . meloxicam (MOBIC) 7.5 MG tablet Take 1-2 tablets (7.5-15 mg total) by mouth daily as needed for pain. 60 tablet 1  . triamterene-hydrochlorothiazide (MAXZIDE-25) 37.5-25 MG per tablet Take 1 tablet by mouth daily. 90 tablet 3  . Clobetasol Propionate Emulsion 0.05 % topical foam Apply topically See admin instructions. Reported on 02/07/2016  1  . hydrocortisone (ANUSOL-HC) 2.5 % rectal cream Apply rectally 2 times daily    . traMADol (ULTRAM) 50 MG tablet Take 1-2 tablets (50-100 mg total) by mouth 2  (two) times daily as needed for severe pain. (Patient not taking: Reported on 02/07/2016) 100 tablet 3  . triamcinolone (KENALOG) 0.025 % ointment Apply 1 application topically 2 (two) times daily. Do not apply to face (Patient not taking: Reported on 02/07/2016) 15 g 1   Facility-Administered Medications Prior to Visit  Medication Dose Route Frequency Provider Last Rate Last Dose  . methylPREDNISolone acetate (DEPO-MEDROL) injection 40 mg  40 mg Intra-articular Once Aleksei Plotnikov V, MD      . methylPREDNISolone acetate (DEPO-MEDROL) injection 80 mg  80 mg Intra-articular Once Aleksei Plotnikov V, MD        ROS Review of Systems  Constitutional: Negative for chills, activity change, appetite change, fatigue and unexpected weight change.  HENT: Negative for congestion, mouth sores and sinus pressure.   Eyes: Negative for visual disturbance.  Respiratory: Negative for cough and chest tightness.   Gastrointestinal: Negative for nausea and abdominal pain.  Genitourinary: Negative for frequency, difficulty urinating and vaginal pain.  Musculoskeletal: Positive for arthralgias. Negative for back pain and gait problem.  Skin: Negative for pallor and rash.  Neurological: Negative for dizziness, tremors, weakness, numbness and headaches.  Psychiatric/Behavioral: Negative for confusion and sleep disturbance.    Objective:  BP 110/68 mmHg  Pulse 65  Wt 173 lb (78.472 kg)  SpO2  98%  BP Readings from Last 3 Encounters:  02/07/16 110/68  10/18/15 110/70  09/29/15 116/60    Wt Readings from Last 3 Encounters:  02/07/16 173 lb (78.472 kg)  10/18/15 173 lb (78.472 kg)  09/29/15 175 lb (79.379 kg)    Physical Exam  Constitutional: She appears well-developed. No distress.  HENT:  Head: Normocephalic.  Right Ear: External ear normal.  Left Ear: External ear normal.  Nose: Nose normal.  Mouth/Throat: Oropharynx is clear and moist.  Eyes: Conjunctivae are normal. Pupils are equal, round,  and reactive to light. Right eye exhibits no discharge. Left eye exhibits no discharge.  Neck: Normal range of motion. Neck supple. No JVD present. No tracheal deviation present. No thyromegaly present.  Cardiovascular: Normal rate, regular rhythm and normal heart sounds.   Pulmonary/Chest: No stridor. No respiratory distress. She has no wheezes.  Abdominal: Soft. Bowel sounds are normal. She exhibits no distension and no mass. There is no tenderness. There is no rebound and no guarding.  Musculoskeletal: She exhibits tenderness. She exhibits no edema.  Lymphadenopathy:    She has no cervical adenopathy.  Neurological: She displays normal reflexes. No cranial nerve deficit. She exhibits normal muscle tone. Coordination normal.  Skin: No rash noted. No erythema.  Psychiatric: She has a normal mood and affect. Her behavior is normal. Judgment and thought content normal.  knees tender Obese  Lab Results  Component Value Date   WBC 5.7 07/28/2014   HGB 12.5 07/28/2014   HCT 38.6 07/28/2014   PLT 241.0 07/28/2014   GLUCOSE 104* 07/12/2015   CHOL 222* 07/06/2013   TRIG 160.0* 07/06/2013   HDL 48.20 07/06/2013   LDLDIRECT 153.6 07/06/2013   ALT 13 07/28/2014   AST 16 07/28/2014   NA 141 07/12/2015   K 4.0 07/12/2015   CL 103 07/12/2015   CREATININE 1.18 07/12/2015   BUN 16 07/12/2015   CO2 32 07/12/2015   TSH 0.79 07/06/2013   HGBA1C 5.8 07/28/2014    No results found.  Assessment & Plan:   Diagnoses and all orders for this visit:  Essential hypertension -     VITAMIN D 25 Hydroxy (Vit-D Deficiency, Fractures); Future -     Vitamin B12; Future -     Hepatic function panel; Future -     Basic metabolic panel; Future  Vitamin B 12 deficiency -     VITAMIN D 25 Hydroxy (Vit-D Deficiency, Fractures); Future -     Vitamin B12; Future -     Hepatic function panel; Future -     Basic metabolic panel; Future  Arthralgia of right lower leg -     VITAMIN D 25 Hydroxy (Vit-D  Deficiency, Fractures); Future -     Vitamin B12; Future -     Hepatic function panel; Future -     Basic metabolic panel; Future  Chronic right-sided low back pain with sciatica, sciatica laterality unspecified -     VITAMIN D 25 Hydroxy (Vit-D Deficiency, Fractures); Future -     Vitamin B12; Future -     Hepatic function panel; Future -     Basic metabolic panel; Future  Vitamin D deficiency -     VITAMIN D 25 Hydroxy (Vit-D Deficiency, Fractures); Future -     Vitamin B12; Future -     Hepatic function panel; Future -     Basic metabolic panel; Future  Other orders -     meloxicam (MOBIC) 7.5 MG tablet; Take 1-2 tablets (  7.5-15 mg total) by mouth daily as needed for pain. -     amLODipine (NORVASC) 10 MG tablet; Take 0.5 tablets (5 mg total) by mouth daily. -     triamterene-hydrochlorothiazide (MAXZIDE-25) 37.5-25 MG tablet; Take 1 tablet by mouth daily. -     losartan (COZAAR) 100 MG tablet; Take 1 tablet (100 mg total) by mouth daily.   I have changed Ms. Rollo's triamterene-hydrochlorothiazide. I am also having her maintain her ferrous sulfate, fish oil-omega-3 fatty acids, cholecalciferol, Vitamin B-12, hydrocortisone, loratadine, fluticasone, Clobetasol Propionate Emulsion, traMADol, potassium chloride, triamcinolone, LORazepam, hydrOXYzine, meloxicam, amLODipine, and losartan. We will continue to administer methylPREDNISolone acetate and methylPREDNISolone acetate.  Meds ordered this encounter  Medications  . meloxicam (MOBIC) 7.5 MG tablet    Sig: Take 1-2 tablets (7.5-15 mg total) by mouth daily as needed for pain.    Dispense:  60 tablet    Refill:  5  . amLODipine (NORVASC) 10 MG tablet    Sig: Take 0.5 tablets (5 mg total) by mouth daily.    Dispense:  90 tablet    Refill:  3  . triamterene-hydrochlorothiazide (MAXZIDE-25) 37.5-25 MG tablet    Sig: Take 1 tablet by mouth daily.    Dispense:  90 tablet    Refill:  3  . losartan (COZAAR) 100 MG tablet    Sig:  Take 1 tablet (100 mg total) by mouth daily.    Dispense:  90 tablet    Refill:  3     Follow-up: Return in about 4 months (around 06/08/2016) for a follow-up visit.  [name], MD

## 2016-02-07 NOTE — Progress Notes (Signed)
Pre visit review using our clinic review tool, if applicable. No additional management support is needed unless otherwise documented below in the visit note. 

## 2016-02-07 NOTE — Assessment & Plan Note (Addendum)
Discussed options Mobic   Potential benefits of a long term NSAIDs use as well as potential risks (i.e. addiction risk, apnea etc) and complications (i.e. Somnolence, constipation and others) were explained to the patient and were aknowledged.  Ortho ref offered

## 2016-02-07 NOTE — Assessment & Plan Note (Signed)
Mobic   Potential benefits of a long term NSAIDs use as well as potential risks (i.e. addiction risk, apnea etc) and complications (i.e. Somnolence, constipation and others) were explained to the patient and were aknowledged. 

## 2016-02-07 NOTE — Addendum Note (Signed)
Addended by: Cassandria Anger on: 02/07/2016 09:10 PM   Modules accepted: Orders

## 2016-02-07 NOTE — Assessment & Plan Note (Signed)
On B12 

## 2016-02-16 ENCOUNTER — Telehealth: Payer: Self-pay | Admitting: Internal Medicine

## 2016-02-16 NOTE — Telephone Encounter (Signed)
[facility] called about drug interactive of Klor-Con and Triamtrene (can cause increase in potassium). Please advise, is this okey refill?

## 2016-02-16 NOTE — Telephone Encounter (Signed)
It is ok Thx 

## 2016-02-17 NOTE — Telephone Encounter (Signed)
Notified [facility] w/ MD response../lmb 

## 2016-04-26 ENCOUNTER — Other Ambulatory Visit: Payer: Self-pay | Admitting: Internal Medicine

## 2016-05-09 ENCOUNTER — Other Ambulatory Visit: Payer: Self-pay | Admitting: Internal Medicine

## 2016-05-11 NOTE — Telephone Encounter (Signed)
Faxed script back to [facility].../lmb 

## 2016-06-08 ENCOUNTER — Ambulatory Visit: Payer: Medicare Other | Admitting: Internal Medicine

## 2016-06-19 ENCOUNTER — Ambulatory Visit: Payer: Medicare Other | Admitting: Internal Medicine

## 2016-06-22 ENCOUNTER — Ambulatory Visit (INDEPENDENT_AMBULATORY_CARE_PROVIDER_SITE_OTHER): Payer: Medicare Other | Admitting: Internal Medicine

## 2016-06-22 ENCOUNTER — Encounter: Payer: Self-pay | Admitting: Internal Medicine

## 2016-06-22 VITALS — BP 118/60 | HR 60 | Wt 173.0 lb

## 2016-06-22 DIAGNOSIS — Z0000 Encounter for general adult medical examination without abnormal findings: Secondary | ICD-10-CM

## 2016-06-22 DIAGNOSIS — F411 Generalized anxiety disorder: Secondary | ICD-10-CM | POA: Diagnosis not present

## 2016-06-22 DIAGNOSIS — E538 Deficiency of other specified B group vitamins: Secondary | ICD-10-CM | POA: Diagnosis not present

## 2016-06-22 DIAGNOSIS — F4321 Adjustment disorder with depressed mood: Secondary | ICD-10-CM | POA: Diagnosis not present

## 2016-06-22 DIAGNOSIS — E785 Hyperlipidemia, unspecified: Secondary | ICD-10-CM

## 2016-06-22 DIAGNOSIS — M170 Bilateral primary osteoarthritis of knee: Secondary | ICD-10-CM

## 2016-06-22 NOTE — Assessment & Plan Note (Signed)
Ibuprofen prn pc °

## 2016-06-22 NOTE — Progress Notes (Signed)
Subjective:  Patient ID: Jane Mooney, female    DOB: 1951-02-23  Age: 65 y.o. MRN: [id number]  CC: No chief complaint on file.   HPI Jane Mooney presents for knee OA, anxiety  Outpatient Medications Prior to Visit  Medication Sig Dispense Refill  . amLODipine (NORVASC) 10 MG tablet TAKE 1 TABLET BY MOUTH DAILY 90 tablet 2  . cholecalciferol (VITAMIN D) 1000 units tablet Take 2 tablets (2,000 Units total) by mouth daily. 100 tablet 11  . Cyanocobalamin (VITAMIN B-12) 1000 MCG SUBL Place 1 tablet (1,000 mcg total) under the tongue daily. 100 tablet 3  . ferrous sulfate (FERRO-BOB) 325 (65 FE) MG tablet Take 325 mg by mouth daily. For anemia     . fish oil-omega-3 fatty acids 1000 MG capsule Take 1 g by mouth daily.      . fluticasone (FLONASE) 50 MCG/ACT nasal spray Place 2 sprays into both nostrils 2 (two) times daily. Decrease to 2 sprays/nostril daily after 5 days 16 g 2  . hydrOXYzine (ATARAX/VISTARIL) 25 MG tablet Take 1 tablet (25 mg total) by mouth every 8 (eight) hours as needed. 90 tablet 1  . loratadine (CLARITIN) 10 MG tablet Take 1 tablet (10 mg total) by mouth daily. 100 tablet 3  . LORazepam (ATIVAN) 1 MG tablet TAKE 1 TABLET (1 MG TOTAL) BY MOUTH 2 (TWO) TIMES DAILY AS NEEDED FOR ANXIETY 60 tablet 3  . losartan (COZAAR) 100 MG tablet Take 1 tablet (100 mg total) by mouth daily. 90 tablet 3  . potassium chloride (KLOR-CON M10) 10 MEQ tablet TAKE 1 TABLET DAILY 30 tablet 11  . triamterene-hydrochlorothiazide (MAXZIDE-25) 37.5-25 MG tablet Take 1 tablet by mouth daily. 90 tablet 3  . Clobetasol Propionate Emulsion 0.05 % topical foam Apply topically See admin instructions. Reported on 02/07/2016  1  . hydrocortisone (ANUSOL-HC) 2.5 % rectal cream Apply rectally 2 times daily    . meloxicam (MOBIC) 7.5 MG tablet Take 1-2 tablets (7.5-15 mg total) by mouth daily as needed for pain. (Patient not taking: Reported on 06/22/2016) 60 tablet 5  . traMADol (ULTRAM) 50 MG tablet Take  1-2 tablets (50-100 mg total) by mouth 2 (two) times daily as needed for severe pain. (Patient not taking: Reported on 06/22/2016) 100 tablet 3  . triamcinolone (KENALOG) 0.025 % ointment Apply 1 application topically 2 (two) times daily. Do not apply to face (Patient not taking: Reported on 06/22/2016) 15 g 1  . ergocalciferol (VITAMIN D2) 50000 units capsule Take 1 capsule (50,000 Units total) by mouth once a week. (Patient not taking: Reported on 06/22/2016) 6 capsule 0   Facility-Administered Medications Prior to Visit  Medication Dose Route Frequency Provider Last Rate Last Dose  . methylPREDNISolone acetate (DEPO-MEDROL) injection 40 mg  40 mg Intra-articular Once Evie Lacks Emmani Lesueur, MD      . methylPREDNISolone acetate (DEPO-MEDROL) injection 80 mg  80 mg Intra-articular Once Cassandria Anger, MD        ROS Review of Systems  Constitutional: Negative for activity change, appetite change, chills, fatigue and unexpected weight change.  HENT: Negative for congestion, mouth sores and sinus pressure.   Eyes: Negative for visual disturbance.  Respiratory: Negative for cough and chest tightness.   Gastrointestinal: Negative for abdominal pain and nausea.  Genitourinary: Negative for difficulty urinating, frequency and vaginal pain.  Musculoskeletal: Positive for arthralgias and gait problem. Negative for back pain.  Skin: Negative for pallor and rash.  Neurological: Negative for dizziness, tremors, weakness, numbness  and headaches.  Psychiatric/Behavioral: Negative for confusion and sleep disturbance. The patient is nervous/anxious.     Objective:  BP 118/60   Pulse 60   Wt 173 lb (78.5 kg)   SpO2 99%   BMI 30.16 kg/m   BP Readings from Last 3 Encounters:  06/22/16 118/60  02/07/16 110/68  10/18/15 110/70    Wt Readings from Last 3 Encounters:  06/22/16 173 lb (78.5 kg)  02/07/16 173 lb (78.5 kg)  10/18/15 173 lb (78.5 kg)    Physical Exam  Constitutional: She appears  well-developed. No distress.  HENT:  Head: Normocephalic.  Right Ear: External ear normal.  Left Ear: External ear normal.  Nose: Nose normal.  Mouth/Throat: Oropharynx is clear and moist.  Eyes: Conjunctivae are normal. Pupils are equal, round, and reactive to light. Right eye exhibits no discharge. Left eye exhibits no discharge.  Neck: Normal range of motion. Neck supple. No JVD present. No tracheal deviation present. No thyromegaly present.  Cardiovascular: Normal rate, regular rhythm and normal heart sounds.   Pulmonary/Chest: No stridor. No respiratory distress. She has no wheezes.  Abdominal: Soft. Bowel sounds are normal. She exhibits no distension and no mass. There is no tenderness. There is no rebound and no guarding.  Musculoskeletal: She exhibits tenderness. She exhibits no edema.  Lymphadenopathy:    She has no cervical adenopathy.  Neurological: She displays normal reflexes. No cranial nerve deficit. She exhibits normal muscle tone. Coordination normal.  Skin: No rash noted. No erythema.  Psychiatric: She has a normal mood and affect. Her behavior is normal. Judgment and thought content normal.  Knee pain w/ROM B  Lab Results  Component Value Date   WBC 5.7 07/28/2014   HGB 12.5 07/28/2014   HCT 38.6 07/28/2014   PLT 241.0 07/28/2014   GLUCOSE 106 (H) 02/07/2016   CHOL 222 (H) 07/06/2013   TRIG 160.0 (H) 07/06/2013   HDL 48.20 07/06/2013   LDLDIRECT 153.6 07/06/2013   ALT 11 02/07/2016   AST 17 02/07/2016   NA 140 02/07/2016   K 4.0 02/07/2016   CL 102 02/07/2016   CREATININE 1.06 02/07/2016   BUN 19 02/07/2016   CO2 30 02/07/2016   TSH 0.79 07/06/2013   HGBA1C 5.8 07/28/2014    No results found.  Assessment & Plan:   There are no diagnoses linked to this encounter. I have discontinued Ms. Kosh's ergocalciferol. I am also having her maintain her ferrous sulfate, fish oil-omega-3 fatty acids, Vitamin B-12, hydrocortisone, loratadine, fluticasone,  Clobetasol Propionate Emulsion, traMADol, potassium chloride, triamcinolone, hydrOXYzine, meloxicam, triamterene-hydrochlorothiazide, losartan, cholecalciferol, amLODipine, and LORazepam. We will continue to administer methylPREDNISolone acetate and methylPREDNISolone acetate.  No orders of the defined types were placed in this encounter.    Follow-up: No Follow-up on file.  [name], MD

## 2016-06-22 NOTE — Assessment & Plan Note (Signed)
Doing fair at present

## 2016-06-22 NOTE — Assessment & Plan Note (Signed)
Chronic Lorazepam prn  Potential benefits of a long term benzodiazepines  use as well as potential risks  and complications were explained to the patient and were aknowledged. 

## 2016-06-22 NOTE — Patient Instructions (Signed)
Biotin Rogaine for women

## 2016-06-22 NOTE — Progress Notes (Signed)
Pre visit review using our clinic review tool, if applicable. No additional management support is needed unless otherwise documented below in the visit note. 

## 2016-06-22 NOTE — Assessment & Plan Note (Signed)
On B12 

## 2016-07-03 ENCOUNTER — Encounter: Payer: Self-pay | Admitting: Internal Medicine

## 2016-08-17 ENCOUNTER — Encounter: Payer: Self-pay | Admitting: Internal Medicine

## 2016-08-17 ENCOUNTER — Ambulatory Visit (INDEPENDENT_AMBULATORY_CARE_PROVIDER_SITE_OTHER): Payer: Medicare Other | Admitting: Internal Medicine

## 2016-08-17 DIAGNOSIS — Z23 Encounter for immunization: Secondary | ICD-10-CM | POA: Diagnosis not present

## 2016-08-17 DIAGNOSIS — L02512 Cutaneous abscess of left hand: Secondary | ICD-10-CM | POA: Diagnosis not present

## 2016-08-17 MED ORDER — DOXYCYCLINE HYCLATE 100 MG PO TABS: 100.0000 mg | ORAL_TABLET | Freq: Two times a day (BID) | ORAL | 0 refills | Status: DC

## 2016-08-17 NOTE — Progress Notes (Signed)
Subjective:  Patient ID: Jane Mooney, female    DOB: 07-31-1951  Age: 65 y.o. MRN: [id number]  CC: Hand Pain (Left 5th finger. She cut it shaving 2 weeks ago)   HPI SUSANE HARGREAVES presents for L 5th digit pain and swelling - cut it with a razor 2 wks ago  Outpatient Medications Prior to Visit  Medication Sig Dispense Refill  . amLODipine (NORVASC) 10 MG tablet TAKE 1 TABLET BY MOUTH DAILY 90 tablet 2  . cholecalciferol (VITAMIN D) 1000 units tablet Take 2 tablets (2,000 Units total) by mouth daily. 100 tablet 11  . Cyanocobalamin (VITAMIN B-12) 1000 MCG SUBL Place 1 tablet (1,000 mcg total) under the tongue daily. 100 tablet 3  . ferrous sulfate (FERRO-BOB) 325 (65 FE) MG tablet Take 325 mg by mouth daily. For anemia     . fish oil-omega-3 fatty acids 1000 MG capsule Take 1 g by mouth daily.      . fluticasone (FLONASE) 50 MCG/ACT nasal spray Place 2 sprays into both nostrils 2 (two) times daily. Decrease to 2 sprays/nostril daily after 5 days 16 g 2  . hydrOXYzine (ATARAX/VISTARIL) 25 MG tablet Take 1 tablet (25 mg total) by mouth every 8 (eight) hours as needed. 90 tablet 1  . loratadine (CLARITIN) 10 MG tablet Take 1 tablet (10 mg total) by mouth daily. 100 tablet 3  . LORazepam (ATIVAN) 1 MG tablet TAKE 1 TABLET (1 MG TOTAL) BY MOUTH 2 (TWO) TIMES DAILY AS NEEDED FOR ANXIETY 60 tablet 3  . losartan (COZAAR) 100 MG tablet Take 1 tablet (100 mg total) by mouth daily. 90 tablet 3  . potassium chloride (KLOR-CON M10) 10 MEQ tablet TAKE 1 TABLET DAILY 30 tablet 11  . triamterene-hydrochlorothiazide (MAXZIDE-25) 37.5-25 MG tablet Take 1 tablet by mouth daily. 90 tablet 3  . Clobetasol Propionate Emulsion 0.05 % topical foam Apply topically See admin instructions. Reported on 02/07/2016  1  . hydrocortisone (ANUSOL-HC) 2.5 % rectal cream Apply rectally 2 times daily    . meloxicam (MOBIC) 7.5 MG tablet Take 1-2 tablets (7.5-15 mg total) by mouth daily as needed for pain. (Patient not taking:  Reported on 08/17/2016) 60 tablet 5  . traMADol (ULTRAM) 50 MG tablet Take 1-2 tablets (50-100 mg total) by mouth 2 (two) times daily as needed for severe pain. (Patient not taking: Reported on 08/17/2016) 100 tablet 3  . triamcinolone (KENALOG) 0.025 % ointment Apply 1 application topically 2 (two) times daily. Do not apply to face (Patient not taking: Reported on 08/17/2016) 15 g 1   Facility-Administered Medications Prior to Visit  Medication Dose Route Frequency Provider Last Rate Last Dose  . methylPREDNISolone acetate (DEPO-MEDROL) injection 40 mg  40 mg Intra-articular Once Evie Lacks Plotnikov, MD      . methylPREDNISolone acetate (DEPO-MEDROL) injection 80 mg  80 mg Intra-articular Once Cassandria Anger, MD        ROS Review of Systems  Constitutional: Negative for chills.  Musculoskeletal: Positive for arthralgias and gait problem.  Skin: Positive for color change and wound.    Objective:  BP 108/64   Pulse (!) 59   Temp 98.9 F (37.2 C) (Oral)   Wt 171 lb (77.6 kg)   SpO2 98%   BMI 29.82 kg/m   BP Readings from Last 3 Encounters:  08/17/16 108/64  06/22/16 118/60  02/07/16 110/68    Wt Readings from Last 3 Encounters:  08/17/16 171 lb (77.6 kg)  06/22/16 173 lb (78.5  kg)  02/07/16 173 lb (78.5 kg)    Physical Exam  Constitutional: No distress.  Skin: She is not diaphoretic. There is erythema.     L 5th digit with 7 mm swelling w/a scab - very tender   Procedure note:  Incision and Drainage of an Abscess   Indication : a localized collection of pus that is tender and not spontaneously resolving.    Risks including unsuccessful procedure , possible need for a repeat procedure due to pus accumulation, scar formation, and others as well as benefits were explained to the patient in detail.Verbal consent was obtained/signed.     The area of the finger abscess was prepped with povidone-iodine. Local 1 cc 2% lido. Abscess over DIP was incised. About 1/4 cc of  purulent material was expressed.The cavity was cleaned.   The wound was dressed with antibiotic ointment and a bandaid.  Tolerated well. Complications: None.   Wound instructions provided.    Please contact us if you notice a recollection of pus in the abscess fever and chills increased pain redness red streaks near the abscess increased swelling in the area.     Lab Results  Component Value Date   WBC 5.7 07/28/2014   HGB 12.5 07/28/2014   HCT 38.6 07/28/2014   PLT 241.0 07/28/2014   GLUCOSE 106 (H) 02/07/2016   CHOL 222 (H) 07/06/2013   TRIG 160.0 (H) 07/06/2013   HDL 48.20 07/06/2013   LDLDIRECT 153.6 07/06/2013   ALT 11 02/07/2016   AST 17 02/07/2016   NA 140 02/07/2016   K 4.0 02/07/2016   CL 102 02/07/2016   CREATININE 1.06 02/07/2016   BUN 19 02/07/2016   CO2 30 02/07/2016   TSH 0.79 07/06/2013   HGBA1C 5.8 07/28/2014    No results found.  Assessment & Plan:   There are no diagnoses linked to this encounter. I am having Ms. Roggenkamp maintain her ferrous sulfate, fish oil-omega-3 fatty acids, Vitamin B-12, hydrocortisone, loratadine, fluticasone, Clobetasol Propionate Emulsion, traMADol, potassium chloride, triamcinolone, hydrOXYzine, meloxicam, triamterene-hydrochlorothiazide, losartan, cholecalciferol, amLODipine, and LORazepam. We will continue to administer methylPREDNISolone acetate and methylPREDNISolone acetate.  No orders of the defined types were placed in this encounter.    Follow-up: No Follow-up on file.  [name], MD

## 2016-08-17 NOTE — Patient Instructions (Signed)
Please contact us if you notice a recollection of pus in the abscess fever and chills increased pain redness red streaks near the abscess increased swelling in the area. 

## 2016-08-17 NOTE — Progress Notes (Signed)
Pre visit review using our clinic review tool, if applicable. No additional management support is needed unless otherwise documented below in the visit note. 

## 2016-08-17 NOTE — Assessment & Plan Note (Signed)
I&D Doxy x 5 d

## 2016-08-17 NOTE — Addendum Note (Signed)
Addended by: Cresenciano Lick on: 08/17/2016 09:00 AM   Modules accepted: Orders

## 2016-09-20 ENCOUNTER — Other Ambulatory Visit (INDEPENDENT_AMBULATORY_CARE_PROVIDER_SITE_OTHER): Payer: Medicare Other

## 2016-09-20 DIAGNOSIS — Z0000 Encounter for general adult medical examination without abnormal findings: Secondary | ICD-10-CM

## 2016-09-20 DIAGNOSIS — E785 Hyperlipidemia, unspecified: Secondary | ICD-10-CM | POA: Diagnosis not present

## 2016-09-20 DIAGNOSIS — F411 Generalized anxiety disorder: Secondary | ICD-10-CM

## 2016-09-20 LAB — CBC WITH DIFFERENTIAL/PLATELET
BASOS PCT: 1.2 % (ref 0.0–3.0)
Basophils Absolute: 0.1 10*3/uL (ref 0.0–0.1)
EOS ABS: 0.1 10*3/uL (ref 0.0–0.7)
Eosinophils Relative: 2.8 % (ref 0.0–5.0)
HEMATOCRIT: 36.1 % (ref 36.0–46.0)
Hemoglobin: 12.1 g/dL (ref 12.0–15.0)
LYMPHS ABS: 2.1 10*3/uL (ref 0.7–4.0)
LYMPHS PCT: 44.5 % (ref 12.0–46.0)
MCHC: 33.5 g/dL (ref 30.0–36.0)
MCV: 78.2 fl (ref 78.0–100.0)
MONO ABS: 0.6 10*3/uL (ref 0.1–1.0)
Monocytes Relative: 12.2 % — ABNORMAL HIGH (ref 3.0–12.0)
NEUTROS ABS: 1.9 10*3/uL (ref 1.4–7.7)
NEUTROS PCT: 39.3 % — AB (ref 43.0–77.0)
PLATELETS: 210 10*3/uL (ref 150.0–400.0)
RBC: 4.61 Mil/uL (ref 3.87–5.11)
RDW: 14.2 % (ref 11.5–15.5)
WBC: 4.8 10*3/uL (ref 4.0–10.5)

## 2016-09-20 LAB — URINALYSIS, ROUTINE W REFLEX MICROSCOPIC
BILIRUBIN URINE: NEGATIVE
HGB URINE DIPSTICK: NEGATIVE
KETONES UR: NEGATIVE
NITRITE: NEGATIVE
RBC / HPF: NONE SEEN (ref 0–?)
Specific Gravity, Urine: 1.015 (ref 1.000–1.030)
Total Protein, Urine: NEGATIVE
Urine Glucose: NEGATIVE
Urobilinogen, UA: 0.2 (ref 0.0–1.0)
pH: 6.5 (ref 5.0–8.0)

## 2016-09-20 LAB — BASIC METABOLIC PANEL
BUN: 15 mg/dL (ref 6–23)
CO2: 32 mEq/L (ref 19–32)
CREATININE: 0.99 mg/dL (ref 0.40–1.20)
Calcium: 9.8 mg/dL (ref 8.4–10.5)
Chloride: 105 mEq/L (ref 96–112)
GFR: 71.91 mL/min (ref >60.00)
Glucose, Bld: 100 mg/dL — ABNORMAL HIGH (ref 70–99)
POTASSIUM: 4.3 meq/L (ref 3.5–5.1)
Sodium: 142 mEq/L (ref 135–145)

## 2016-09-20 LAB — LIPID PANEL
CHOLESTEROL: 222 mg/dL — AB (ref 0–200)
HDL: 51.3 mg/dL (ref >39.00)
LDL Cholesterol: 144 mg/dL — ABNORMAL HIGH (ref 0–99)
NonHDL: 170.92
TRIGLYCERIDES: 134 mg/dL (ref 0.0–149.0)
Total CHOL/HDL Ratio: 4
VLDL: 26.8 mg/dL (ref 0.0–40.0)

## 2016-09-20 LAB — HEPATIC FUNCTION PANEL
ALT: 10 U/L (ref 0–35)
AST: 16 U/L (ref 0–37)
Albumin: 4.1 g/dL (ref 3.5–5.2)
Alkaline Phosphatase: 70 U/L (ref 39–117)
BILIRUBIN DIRECT: 0.1 mg/dL (ref 0.0–0.3)
BILIRUBIN TOTAL: 0.4 mg/dL (ref 0.2–1.2)
Total Protein: 7.3 g/dL (ref 6.0–8.3)

## 2016-09-20 LAB — TSH: TSH: 2.34 u[IU]/mL (ref 0.35–4.50)

## 2016-09-20 MED ORDER — CEFUROXIME AXETIL 250 MG PO TABS: 250.0000 mg | ORAL_TABLET | Freq: Two times a day (BID) | ORAL | 0 refills | Status: AC

## 2016-09-21 LAB — HEPATITIS C ANTIBODY: HCV Ab: NEGATIVE

## 2016-10-23 ENCOUNTER — Ambulatory Visit (INDEPENDENT_AMBULATORY_CARE_PROVIDER_SITE_OTHER)
Admission: RE | Admit: 2016-10-23 | Discharge: 2016-10-23 | Disposition: A | Discharge status: Home / Self Care | Payer: Medicare Other | Source: Ambulatory Visit | Attending: Internal Medicine | Admitting: Internal Medicine

## 2016-10-23 ENCOUNTER — Encounter: Payer: Self-pay | Admitting: Internal Medicine

## 2016-10-23 ENCOUNTER — Ambulatory Visit (INDEPENDENT_AMBULATORY_CARE_PROVIDER_SITE_OTHER): Payer: Medicare Other | Admitting: Internal Medicine

## 2016-10-23 VITALS — BP 110/72 | HR 59 | Ht 63.5 in | Wt 173.0 lb

## 2016-10-23 DIAGNOSIS — Z0000 Encounter for general adult medical examination without abnormal findings: Secondary | ICD-10-CM | POA: Diagnosis not present

## 2016-10-23 DIAGNOSIS — Z780 Asymptomatic menopausal state: Secondary | ICD-10-CM

## 2016-10-23 DIAGNOSIS — J209 Acute bronchitis, unspecified: Secondary | ICD-10-CM

## 2016-10-23 MED ORDER — AZITHROMYCIN 250 MG PO TABS: ORAL_TABLET | ORAL | 0 refills | Status: DC

## 2016-10-23 MED ORDER — LORAZEPAM 1 MG PO TABS: 1.0000 mg | ORAL_TABLET | Freq: Two times a day (BID) | ORAL | 3 refills | Status: DC | PRN

## 2016-10-23 MED ORDER — LOSARTAN POTASSIUM 100 MG PO TABS: 100.0000 mg | ORAL_TABLET | Freq: Every day | ORAL | 3 refills | Status: DC

## 2016-10-23 MED ORDER — PROMETHAZINE-CODEINE 6.25-10 MG/5ML PO SYRP: 5.0000 mL | ORAL_SOLUTION | ORAL | 0 refills | Status: DC | PRN

## 2016-10-23 MED ORDER — AMLODIPINE BESYLATE 10 MG PO TABS: 10.0000 mg | ORAL_TABLET | Freq: Every day | ORAL | 3 refills | Status: DC

## 2016-10-23 MED ORDER — TRIAMTERENE-HCTZ 37.5-25 MG PO TABS: 1.0000 | ORAL_TABLET | Freq: Every day | ORAL | 3 refills | Status: DC

## 2016-10-23 NOTE — Patient Instructions (Signed)

## 2016-10-23 NOTE — Assessment & Plan Note (Signed)
DEXA scan

## 2016-10-23 NOTE — Assessment & Plan Note (Signed)
Zpac 

## 2016-10-23 NOTE — Progress Notes (Signed)
Subjective:  Patient ID: Jane Mooney, female    DOB: 15-Apr-1951  Age: 65 y.o. MRN: [id number]  CC: No chief complaint on file.   Cough  This is a new problem. The current episode started 1 to 4 weeks ago. The problem has been gradually worsening. The cough is productive of sputum. Pertinent negatives include no chills, headaches or rash. There is no history of pneumonia.   Jane Mooney presents for well exam C/o cough x 3 weeks  Outpatient Medications Prior to Visit  Medication Sig Dispense Refill  . amLODipine (NORVASC) 10 MG tablet TAKE 1 TABLET BY MOUTH DAILY 90 tablet 2  . cholecalciferol (VITAMIN D) 1000 units tablet Take 2 tablets (2,000 Units total) by mouth daily. 100 tablet 11  . Cyanocobalamin (VITAMIN B-12) 1000 MCG SUBL Place 1 tablet (1,000 mcg total) under the tongue daily. 100 tablet 3  . ferrous sulfate (FERRO-BOB) 325 (65 FE) MG tablet Take 325 mg by mouth daily. For anemia     . fish oil-omega-3 fatty acids 1000 MG capsule Take 1 g by mouth daily.      [name] loratadine (CLARITIN) 10 MG tablet Take 1 tablet (10 mg total) by mouth daily. 100 tablet 3  . LORazepam (ATIVAN) 1 MG tablet TAKE 1 TABLET (1 MG TOTAL) BY MOUTH 2 (TWO) TIMES DAILY AS NEEDED FOR ANXIETY 60 tablet 3  . losartan (COZAAR) 100 MG tablet Take 1 tablet (100 mg total) by mouth daily. 90 tablet 3  . potassium chloride (KLOR-CON M10) 10 MEQ tablet TAKE 1 TABLET DAILY 30 tablet 11  . triamterene-hydrochlorothiazide (MAXZIDE-25) 37.5-25 MG tablet Take 1 tablet by mouth daily. 90 tablet 3  . Clobetasol Propionate Emulsion 0.05 % topical foam Apply topically See admin instructions. Reported on 02/07/2016  1  . doxycycline (VIBRA-TABS) 100 MG tablet Take 1 tablet (100 mg total) by mouth 2 (two) times daily. (Patient not taking: Reported on 10/23/2016) 10 tablet 0  . fluticasone (FLONASE) 50 MCG/ACT nasal spray Place 2 sprays into both nostrils 2 (two) times daily. Decrease to 2 sprays/nostril daily after 5 days  (Patient not taking: Reported on 10/23/2016) 16 g 2  . hydrocortisone (ANUSOL-HC) 2.5 % rectal cream Apply rectally 2 times daily    . hydrOXYzine (ATARAX/VISTARIL) 25 MG tablet Take 1 tablet (25 mg total) by mouth every 8 (eight) hours as needed. (Patient not taking: Reported on 10/23/2016) 90 tablet 1  . meloxicam (MOBIC) 7.5 MG tablet Take 1-2 tablets (7.5-15 mg total) by mouth daily as needed for pain. (Patient not taking: Reported on 10/23/2016) 60 tablet 5  . traMADol (ULTRAM) 50 MG tablet Take 1-2 tablets (50-100 mg total) by mouth 2 (two) times daily as needed for severe pain. (Patient not taking: Reported on 10/23/2016) 100 tablet 3  . triamcinolone (KENALOG) 0.025 % ointment Apply 1 application topically 2 (two) times daily. Do not apply to face (Patient not taking: Reported on 10/23/2016) 15 g 1   Facility-Administered Medications Prior to Visit  Medication Dose Route Frequency Provider Last Rate Last Dose  . methylPREDNISolone acetate (DEPO-MEDROL) injection 40 mg  40 mg Intra-articular Once Evie Lacks Plotnikov, MD      . methylPREDNISolone acetate (DEPO-MEDROL) injection 80 mg  80 mg Intra-articular Once Cassandria Anger, MD        ROS Review of Systems  Constitutional: Negative for activity change, appetite change, chills, fatigue and unexpected weight change.  HENT: Negative for congestion, mouth sores and sinus pressure.  Eyes: Negative for visual disturbance.  Respiratory: Positive for cough. Negative for chest tightness.   Gastrointestinal: Negative for abdominal pain and nausea.  Genitourinary: Negative for difficulty urinating, frequency and vaginal pain.  Musculoskeletal: Positive for arthralgias and gait problem. Negative for back pain.  Skin: Negative for pallor and rash.  Neurological: Negative for dizziness, tremors, weakness, numbness and headaches.  Psychiatric/Behavioral: Negative for confusion, sleep disturbance and suicidal ideas.    Objective:  BP 110/72    Pulse (!) 59   Ht 5' 3.5" (1.613 m)   Wt 173 lb (78.5 kg)   SpO2 99%   BMI 30.16 kg/m   BP Readings from Last 3 Encounters:  10/23/16 110/72  08/17/16 108/64  06/22/16 118/60    Wt Readings from Last 3 Encounters:  10/23/16 173 lb (78.5 kg)  08/17/16 171 lb (77.6 kg)  06/22/16 173 lb (78.5 kg)    Physical Exam  Constitutional: She appears well-developed. No distress.  HENT:  Head: Normocephalic.  Right Ear: External ear normal.  Left Ear: External ear normal.  Nose: Nose normal.  Mouth/Throat: Oropharynx is clear and moist.  Eyes: Conjunctivae are normal. Pupils are equal, round, and reactive to light. Right eye exhibits no discharge. Left eye exhibits no discharge.  Neck: Normal range of motion. Neck supple. No JVD present. No tracheal deviation present. No thyromegaly present.  Cardiovascular: Normal rate, regular rhythm and normal heart sounds.   Pulmonary/Chest: No stridor. No respiratory distress. She has no wheezes.  Abdominal: Soft. Bowel sounds are normal. She exhibits no distension and no mass. There is no tenderness. There is no rebound and no guarding.  Musculoskeletal: She exhibits tenderness. She exhibits no edema.  Lymphadenopathy:    She has no cervical adenopathy.  Neurological: She displays normal reflexes. No cranial nerve deficit. She exhibits normal muscle tone. Coordination normal.  Skin: No rash noted. No erythema.  Psychiatric: She has a normal mood and affect. Her behavior is normal. Judgment and thought content normal.    Lab Results  Component Value Date   WBC 4.8 09/20/2016   HGB 12.1 09/20/2016   HCT 36.1 09/20/2016   PLT 210.0 09/20/2016   GLUCOSE 100 (H) 09/20/2016   CHOL 222 (H) 09/20/2016   TRIG 134.0 09/20/2016   HDL 51.30 09/20/2016   LDLDIRECT 153.6 07/06/2013   LDLCALC 144 (H) 09/20/2016   ALT 10 09/20/2016   AST 16 09/20/2016   NA 142 09/20/2016   K 4.3 09/20/2016   CL 105 09/20/2016   CREATININE 0.99 09/20/2016   BUN  15 09/20/2016   CO2 32 09/20/2016   TSH 2.34 09/20/2016   HGBA1C 5.8 07/28/2014    No results found.  Assessment & Plan:   Diagnoses and all orders for this visit:  Well adult exam  Other orders -     losartan (COZAAR) 100 MG tablet; Take 1 tablet (100 mg total) by mouth daily. -     amLODipine (NORVASC) 10 MG tablet; Take 1 tablet (10 mg total) by mouth daily. -     triamterene-hydrochlorothiazide (MAXZIDE-25) 37.5-25 MG tablet; Take 1 tablet by mouth daily. -     LORazepam (ATIVAN) 1 MG tablet; Take 1 tablet (1 mg total) by mouth 2 (two) times daily as needed for anxiety.   I am having Ms. Price maintain her ferrous sulfate, fish oil-omega-3 fatty acids, Vitamin B-12, hydrocortisone, loratadine, fluticasone, Clobetasol Propionate Emulsion, traMADol, potassium chloride, triamcinolone, hydrOXYzine, meloxicam, triamterene-hydrochlorothiazide, losartan, cholecalciferol, amLODipine, LORazepam, and doxycycline. We will continue to  administer methylPREDNISolone acetate and methylPREDNISolone acetate.  No orders of the defined types were placed in this encounter.    Follow-up: No Follow-up on file.  [name], MD

## 2016-10-23 NOTE — Assessment & Plan Note (Addendum)
Here for medicare wellness/physical  Diet: heart healthy  Physical activity: not sedentary  Depression/mood screen: negative  Hearing: intact to whispered voice  Visual acuity: grossly normal, performs annual eye exam  ADLs: capable  Fall risk: low to none - knee OA Home safety: good  Cognitive evaluation: intact to orientation, naming, recall and repetition  EOL planning: adv directives, full code/ I agree  I have personally reviewed and have noted  1. The patient's medical, surgical and social history  2. Their use of alcohol, tobacco or illicit drugs  3. Their current medications and supplements  4. The patient's functional ability including ADL's, fall risks, home safety risks and hearing or visual impairment.  5. Diet and physical activities  6. Evidence for depression or mood disorders 7. The roster of all physicians providing medical care to patient - is listed in the Snapshot section of the chart and reviewed today.    Today patient counseled on age appropriate routine health concerns for screening and prevention, each reviewed and up to date or declined. Immunizations reviewed and up to date or declined. Labs ordered and reviewed. Risk factors for depression reviewed and negative. Hearing function and visual acuity are intact. ADLs screened and addressed as needed. Functional ability and level of safety reviewed and appropriate. Education, counseling and referrals performed based on assessed risks today. Patient provided with a copy of personalized plan for preventive services.    Dexa scan

## 2016-10-23 NOTE — Progress Notes (Signed)
Pre visit review using our clinic review tool, if applicable. No additional management support is needed unless otherwise documented below in the visit note. 

## 2016-11-30 ENCOUNTER — Ambulatory Visit (INDEPENDENT_AMBULATORY_CARE_PROVIDER_SITE_OTHER): Payer: Medicare Other | Admitting: Internal Medicine

## 2016-11-30 ENCOUNTER — Encounter: Payer: Self-pay | Admitting: Internal Medicine

## 2016-11-30 DIAGNOSIS — K219 Gastro-esophageal reflux disease without esophagitis: Secondary | ICD-10-CM | POA: Insufficient documentation

## 2016-11-30 DIAGNOSIS — R0789 Other chest pain: Secondary | ICD-10-CM | POA: Diagnosis not present

## 2016-11-30 MED ORDER — PANTOPRAZOLE SODIUM 40 MG PO TBEC: 40.0000 mg | DELAYED_RELEASE_TABLET | Freq: Two times a day (BID) | ORAL | 5 refills | Status: DC

## 2016-11-30 NOTE — Assessment & Plan Note (Signed)
H/o GERD, small HH - EGD in 1994 Start Protonix bid. Hold Iron po

## 2016-11-30 NOTE — Assessment & Plan Note (Addendum)
1/18 ?GERD related H/o GERD, small HH - EGD in 1994 Start Protonix bid. Hold Iron po EKG RTC 3 mo or sooner for more tests if not well in 2 weeks

## 2016-11-30 NOTE — Progress Notes (Signed)
Pre visit review using our clinic review tool, if applicable. No additional management support is needed unless otherwise documented below in the visit note. 

## 2016-11-30 NOTE — Progress Notes (Signed)
Subjective:  Patient ID: Jane Mooney, female    DOB: [birth year]  Age: 66 y.o. MRN: [id number]  CC: Heartburn (since 11/22/16. "I feel like I have a lump throat" Had some relief with Prilosec)   HPI Jane Mooney presents for fullness in the chest on Jan 11 - had CP x1 week, took Rolaids; Prilosec helped better, no complete relief. No dysphagia. No exertional CP. C/o epigastric pain. The pt had similar sx's 20 years ago - HH.  Outpatient Medications Prior to Visit  Medication Sig Dispense Refill  . amLODipine (NORVASC) 10 MG tablet Take 1 tablet (10 mg total) by mouth daily. 90 tablet 3  . cholecalciferol (VITAMIN D) 1000 units tablet Take 2 tablets (2,000 Units total) by mouth daily. 100 tablet 11  . Clobetasol Propionate Emulsion 0.05 % topical foam Apply topically See admin instructions. Reported on 02/07/2016  1  . Cyanocobalamin (VITAMIN B-12) 1000 MCG SUBL Place 1 tablet (1,000 mcg total) under the tongue daily. 100 tablet 3  . ferrous sulfate (FERRO-BOB) 325 (65 FE) MG tablet Take 325 mg by mouth daily. For anemia     . fish oil-omega-3 fatty acids 1000 MG capsule Take 1 g by mouth daily.      . fluticasone (FLONASE) 50 MCG/ACT nasal spray Place 2 sprays into both nostrils 2 (two) times daily. Decrease to 2 sprays/nostril daily after 5 days 16 g 2  . hydrOXYzine (ATARAX/VISTARIL) 25 MG tablet Take 1 tablet (25 mg total) by mouth every 8 (eight) hours as needed. 90 tablet 1  . loratadine (CLARITIN) 10 MG tablet Take 1 tablet (10 mg total) by mouth daily. 100 tablet 3  . LORazepam (ATIVAN) 1 MG tablet Take 1 tablet (1 mg total) by mouth 2 (two) times daily as needed for anxiety. 60 tablet 3  . losartan (COZAAR) 100 MG tablet Take 1 tablet (100 mg total) by mouth daily. 90 tablet 3  . meloxicam (MOBIC) 7.5 MG tablet Take 1-2 tablets (7.5-15 mg total) by mouth daily as needed for pain. 60 tablet 5  . potassium chloride (KLOR-CON M10) 10 MEQ tablet TAKE 1 TABLET DAILY 30 tablet 11  .  traMADol (ULTRAM) 50 MG tablet Take 1-2 tablets (50-100 mg total) by mouth 2 (two) times daily as needed for severe pain. 100 tablet 3  . triamcinolone (KENALOG) 0.025 % ointment Apply 1 application topically 2 (two) times daily. Do not apply to face 15 g 1  . triamterene-hydrochlorothiazide (MAXZIDE-25) 37.5-25 MG tablet Take 1 tablet by mouth daily. 90 tablet 3  . azithromycin (ZITHROMAX) 250 MG tablet As directed 6 tablet 0  . doxycycline (VIBRA-TABS) 100 MG tablet Take 1 tablet (100 mg total) by mouth 2 (two) times daily. 10 tablet 0  . hydrocortisone (ANUSOL-HC) 2.5 % rectal cream Apply rectally 2 times daily    . promethazine-codeine (PHENERGAN WITH CODEINE) 6.25-10 MG/5ML syrup Take 5 mLs by mouth every 4 (four) hours as needed. (Patient not taking: Reported on 11/30/2016) 300 mL 0   Facility-Administered Medications Prior to Visit  Medication Dose Route Frequency Provider Last Rate Last Dose  . methylPREDNISolone acetate (DEPO-MEDROL) injection 40 mg  40 mg Intra-articular Once Aleksei Plotnikov V, MD      . methylPREDNISolone acetate (DEPO-MEDROL) injection 80 mg  80 mg Intra-articular Once Aleksei Plotnikov V, MD        ROS Review of Systems  Constitutional: Negative for activity change, appetite change, chills, fatigue and unexpected weight change.  HENT: Negative for congestion,  mouth sores and sinus pressure.   Eyes: Negative for visual disturbance.  Respiratory: Positive for cough and chest tightness.   Gastrointestinal: Positive for abdominal distention. Negative for abdominal pain and nausea.  Genitourinary: Negative for difficulty urinating, frequency and vaginal pain.  Musculoskeletal: Negative for back pain and gait problem.  Skin: Negative for pallor and rash.  Neurological: Negative for dizziness, tremors, weakness, numbness and headaches.  Psychiatric/Behavioral: Negative for confusion and sleep disturbance.    Objective:  BP 120/70   Pulse 62   Wt 175 lb (79.4 kg)    SpO2 98%   BMI 30.51 kg/m   BP Readings from Last 3 Encounters:  11/30/16 120/70  10/23/16 110/72  08/17/16 108/64    Wt Readings from Last 3 Encounters:  11/30/16 175 lb (79.4 kg)  10/23/16 173 lb (78.5 kg)  08/17/16 171 lb (77.6 kg)    Physical Exam  Constitutional: She appears well-developed. No distress.  HENT:  Head: Normocephalic.  Right Ear: External ear normal.  Left Ear: External ear normal.  Nose: Nose normal.  Mouth/Throat: Oropharynx is clear and moist.  Eyes: Conjunctivae are normal. Pupils are equal, round, and reactive to light. Right eye exhibits no discharge. Left eye exhibits no discharge.  Neck: Normal range of motion. Neck supple. No JVD present. No tracheal deviation present. No thyromegaly present.  Cardiovascular: Normal rate, regular rhythm and normal heart sounds.   Pulmonary/Chest: No stridor. No respiratory distress. She has no wheezes.  Abdominal: Soft. Bowel sounds are normal. She exhibits no distension and no mass. There is no tenderness. There is no rebound and no guarding.  Musculoskeletal: She exhibits no edema or tenderness.  Lymphadenopathy:    She has no cervical adenopathy.  Neurological: She displays normal reflexes. No cranial nerve deficit. She exhibits normal muscle tone. Coordination normal.  Skin: No rash noted. No erythema.  Psychiatric: She has a normal mood and affect. Her behavior is normal. Judgment and thought content normal.   Procedure: EKG Indication: chest pain Impression: NSR. No acute changes.  Lab Results  Component Value Date   WBC 4.8 09/20/2016   HGB 12.1 09/20/2016   HCT 36.1 09/20/2016   PLT 210.0 09/20/2016   GLUCOSE 100 (H) 09/20/2016   CHOL 222 (H) 09/20/2016   TRIG 134.0 09/20/2016   HDL 51.30 09/20/2016   LDLDIRECT 153.6 07/06/2013   LDLCALC 144 (H) 09/20/2016   ALT 10 09/20/2016   AST 16 09/20/2016   NA 142 09/20/2016   K 4.3 09/20/2016   CL 105 09/20/2016   CREATININE 0.99 09/20/2016   BUN  15 09/20/2016   CO2 32 09/20/2016   TSH 2.34 09/20/2016   HGBA1C 5.8 07/28/2014    Dg Bone Density (dxa)  Result Date: 10/28/2016 Date of study: 10/23/16 Exam: DUAL X-RAY ABSORPTIOMETRY (DXA) FOR BONE MINERAL DENSITY (BMD) Instrument: [employer] Provider: PCP Indication: follow up for low BMD Comparison: none (please note that it is not possible to compare data from different instruments) Clinical data: Pt is a 66 y.o. female without previous history of fracture. On calcium and vitamin D. Results:  Lumbar spine (L1-L4) Femoral neck (FN) 33% distal radius T-score -1.8 RFN:-0.4 LFN:-0.5 n/a Change in BMD from previous DXA test (%) n/a n/a n/a (*) statistically significant Assessment: the BMD is low according to the [facility] classification for osteoporosis (see below). Fracture risk: moderate FRAX score: 10 year major osteoporotic risk: 3.3%. 10 year hip fracture risk: 0.2%. These are under the thresholds for treatment of  20% and 3%, respectively. Comments: the technical quality of the study is good. Evaluation for secondary causes should be considered if clinically indicated. Recommend optimizing calcium (1200 mg/day) and vitamin D (800 IU/day) intake. Followup: Repeat BMD is appropriate after 2 years or after 1-2 years if starting treatment. WHO criteria for diagnosis of osteoporosis in postmenopausal women and in men 74 y/o or older: - normal: T-score -1.0 to + 1.0 - osteopenia/low bone density: T-score between -2.5 and -1.0 - osteoporosis: T-score below -2.5 - severe osteoporosis: T-score below -2.5 with history of fragility fracture Note: although not part of the WHO classification, the presence of a fragility fracture, regardless of the T-score, should be considered diagnostic of osteoporosis, provided other causes for the fracture have been excluded. Treatment: The National Osteoporosis Foundation recommends that treatment be considered in postmenopausal women and men age 36 or older  with: 1. Hip or vertebral (clinical or morphometric) fracture 2. T-score of - 2.5 or lower at the spine or hip 3. 10-year fracture probability by FRAX of at least 20% for a major osteoporotic fracture and 3% for a hip fracture Loura Pardon MD    Assessment & Plan:   There are no diagnoses linked to this encounter. I have discontinued Ms. Considine's doxycycline and azithromycin. I am also having her maintain her ferrous sulfate, fish oil-omega-3 fatty acids, Vitamin B-12, hydrocortisone, loratadine, fluticasone, Clobetasol Propionate Emulsion, traMADol, potassium chloride, triamcinolone, hydrOXYzine, meloxicam, cholecalciferol, losartan, amLODipine, triamterene-hydrochlorothiazide, LORazepam, and promethazine-codeine. We will continue to administer methylPREDNISolone acetate and methylPREDNISolone acetate.  No orders of the defined types were placed in this encounter.    Follow-up: No Follow-up on file.  [name], MD

## 2017-02-21 ENCOUNTER — Ambulatory Visit: Payer: Medicare Other | Admitting: Internal Medicine

## 2017-02-22 ENCOUNTER — Encounter: Payer: Self-pay | Admitting: Internal Medicine

## 2017-02-22 ENCOUNTER — Ambulatory Visit (INDEPENDENT_AMBULATORY_CARE_PROVIDER_SITE_OTHER): Payer: Medicare Other | Admitting: Internal Medicine

## 2017-02-22 ENCOUNTER — Other Ambulatory Visit (INDEPENDENT_AMBULATORY_CARE_PROVIDER_SITE_OTHER): Payer: Medicare Other

## 2017-02-22 VITALS — BP 132/68 | HR 52 | Temp 98.9°F | Ht 63.5 in | Wt 177.0 lb

## 2017-02-22 DIAGNOSIS — I10 Essential (primary) hypertension: Secondary | ICD-10-CM

## 2017-02-22 DIAGNOSIS — M170 Bilateral primary osteoarthritis of knee: Secondary | ICD-10-CM | POA: Diagnosis not present

## 2017-02-22 DIAGNOSIS — E538 Deficiency of other specified B group vitamins: Secondary | ICD-10-CM

## 2017-02-22 DIAGNOSIS — K5900 Constipation, unspecified: Secondary | ICD-10-CM | POA: Insufficient documentation

## 2017-02-22 DIAGNOSIS — E559 Vitamin D deficiency, unspecified: Secondary | ICD-10-CM | POA: Diagnosis not present

## 2017-02-22 LAB — BASIC METABOLIC PANEL
BUN: 19 mg/dL (ref 6–23)
CHLORIDE: 105 meq/L (ref 96–112)
CO2: 32 meq/L (ref 19–32)
Calcium: 9.7 mg/dL (ref 8.4–10.5)
Creatinine, Ser: 1.06 mg/dL (ref 0.40–1.20)
GFR: 66.37 mL/min (ref >60.00)
Glucose, Bld: 107 mg/dL — ABNORMAL HIGH (ref 70–99)
POTASSIUM: 3.7 meq/L (ref 3.5–5.1)
SODIUM: 143 meq/L (ref 135–145)

## 2017-02-22 NOTE — Progress Notes (Signed)
Pre visit review using our clinic review tool, if applicable. No additional management support is needed unless otherwise documented below in the visit note. 

## 2017-02-22 NOTE — Progress Notes (Signed)
Subjective:  Patient ID: Jane Mooney, female    DOB: 07-05-1951  Age: 66 y.o. MRN: [id number]  CC: No chief complaint on file.   HPI Jane Mooney presents for knee OA, HTN, GERD f/u  Outpatient Medications Prior to Visit  Medication Sig Dispense Refill  . amLODipine (NORVASC) 10 MG tablet Take 1 tablet (10 mg total) by mouth daily. 90 tablet 3  . cholecalciferol (VITAMIN D) 1000 units tablet Take 2 tablets (2,000 Units total) by mouth daily. 100 tablet 11  . Cyanocobalamin (VITAMIN B-12) 1000 MCG SUBL Place 1 tablet (1,000 mcg total) under the tongue daily. 100 tablet 3  . ferrous sulfate (FERRO-BOB) 325 (65 FE) MG tablet Take 325 mg by mouth daily. For anemia     . fish oil-omega-3 fatty acids 1000 MG capsule Take 1 g by mouth daily.      . fluticasone (FLONASE) 50 MCG/ACT nasal spray Place 2 sprays into both nostrils 2 (two) times daily. Decrease to 2 sprays/nostril daily after 5 days 16 g 2  . loratadine (CLARITIN) 10 MG tablet Take 1 tablet (10 mg total) by mouth daily. 100 tablet 3  . LORazepam (ATIVAN) 1 MG tablet Take 1 tablet (1 mg total) by mouth 2 (two) times daily as needed for anxiety. 60 tablet 3  . losartan (COZAAR) 100 MG tablet Take 1 tablet (100 mg total) by mouth daily. 90 tablet 3  . pantoprazole (PROTONIX) 40 MG tablet Take 1 tablet (40 mg total) by mouth 2 (two) times daily. 60 tablet 5  . potassium chloride (KLOR-CON M10) 10 MEQ tablet TAKE 1 TABLET DAILY 30 tablet 11  . traMADol (ULTRAM) 50 MG tablet Take 1-2 tablets (50-100 mg total) by mouth 2 (two) times daily as needed for severe pain. 100 tablet 3  . hydrocortisone (ANUSOL-HC) 2.5 % rectal cream Apply rectally 2 times daily    . Clobetasol Propionate Emulsion 0.05 % topical foam Apply topically See admin instructions. Reported on 02/07/2016  1  . hydrOXYzine (ATARAX/VISTARIL) 25 MG tablet Take 1 tablet (25 mg total) by mouth every 8 (eight) hours as needed. 90 tablet 1  . triamcinolone (KENALOG) 0.025 %  ointment Apply 1 application topically 2 (two) times daily. Do not apply to face 15 g 1  . triamterene-hydrochlorothiazide (MAXZIDE-25) 37.5-25 MG tablet Take 1 tablet by mouth daily. 90 tablet 3   Facility-Administered Medications Prior to Visit  Medication Dose Route Frequency Provider Last Rate Last Dose  . methylPREDNISolone acetate (DEPO-MEDROL) injection 40 mg  40 mg Intra-articular Once Aleksei Plotnikov V, MD      . methylPREDNISolone acetate (DEPO-MEDROL) injection 80 mg  80 mg Intra-articular Once Aleksei Plotnikov V, MD        ROS Review of Systems  Constitutional: Negative for activity change, appetite change, chills, fatigue and unexpected weight change.  HENT: Negative for congestion, mouth sores and sinus pressure.   Eyes: Negative for visual disturbance.  Respiratory: Negative for cough and chest tightness.   Gastrointestinal: Negative for abdominal pain and nausea.  Genitourinary: Negative for difficulty urinating, frequency and vaginal pain.  Musculoskeletal: Positive for arthralgias and gait problem. Negative for back pain.  Skin: Negative for pallor and rash.  Neurological: Negative for dizziness, tremors, weakness, numbness and headaches.  Psychiatric/Behavioral: Negative for confusion and sleep disturbance.    Objective:  BP 132/68 (BP Location: Left Arm, Patient Position: Sitting, Cuff Size: Large)   Pulse (!) 52   Temp 98.9 F (37.2 C) (Oral)  Ht 5' 3.5" (1.613 m)   Wt 177 lb 0.6 oz (80.3 kg)   SpO2 98%   BMI 30.87 kg/m   BP Readings from Last 3 Encounters:  02/22/17 132/68  11/30/16 120/70  10/23/16 110/72    Wt Readings from Last 3 Encounters:  02/22/17 177 lb 0.6 oz (80.3 kg)  11/30/16 175 lb (79.4 kg)  10/23/16 173 lb (78.5 kg)    Physical Exam  Constitutional: She appears well-developed. No distress.  HENT:  Head: Normocephalic.  Right Ear: External ear normal.  Left Ear: External ear normal.  Nose: Nose normal.  Mouth/Throat:  Oropharynx is clear and moist.  Eyes: Conjunctivae are normal. Pupils are equal, round, and reactive to light. Right eye exhibits no discharge. Left eye exhibits no discharge.  Neck: Normal range of motion. Neck supple. No JVD present. No tracheal deviation present. No thyromegaly present.  Cardiovascular: Normal rate, regular rhythm and normal heart sounds.   Pulmonary/Chest: No stridor. No respiratory distress. She has no wheezes.  Abdominal: Soft. Bowel sounds are normal. She exhibits no distension and no mass. There is no tenderness. There is no rebound and no guarding.  Musculoskeletal: She exhibits tenderness. She exhibits no edema.  Lymphadenopathy:    She has no cervical adenopathy.  Neurological: She displays normal reflexes. No cranial nerve deficit. She exhibits normal muscle tone. Coordination normal.  Skin: No rash noted. No erythema.  Psychiatric: She has a normal mood and affect. Her behavior is normal. Judgment and thought content normal.  obese Painful knees  Lab Results  Component Value Date   WBC 4.8 09/20/2016   HGB 12.1 09/20/2016   HCT 36.1 09/20/2016   PLT 210.0 09/20/2016   GLUCOSE 100 (H) 09/20/2016   CHOL 222 (H) 09/20/2016   TRIG 134.0 09/20/2016   HDL 51.30 09/20/2016   LDLDIRECT 153.6 07/06/2013   LDLCALC 144 (H) 09/20/2016   ALT 10 09/20/2016   AST 16 09/20/2016   NA 142 09/20/2016   K 4.3 09/20/2016   CL 105 09/20/2016   CREATININE 0.99 09/20/2016   BUN 15 09/20/2016   CO2 32 09/20/2016   TSH 2.34 09/20/2016   HGBA1C 5.8 07/28/2014    Dg Bone Density (dxa)  Result Date: 10/28/2016 Date of study: 10/23/16 Exam: DUAL X-RAY ABSORPTIOMETRY (DXA) FOR BONE MINERAL DENSITY (BMD) Instrument: [employer] Provider: PCP Indication: follow up for low BMD Comparison: none (please note that it is not possible to compare data from different instruments) Clinical data: Pt is a 66 y.o. female without previous history of fracture. On calcium  and vitamin D. Results:  Lumbar spine (L1-L4) Femoral neck (FN) 33% distal radius T-score -1.8 RFN:-0.4 LFN:-0.5 n/a Change in BMD from previous DXA test (%) n/a n/a n/a (*) statistically significant Assessment: the BMD is low according to the [facility] classification for osteoporosis (see below). Fracture risk: moderate FRAX score: 10 year major osteoporotic risk: 3.3%. 10 year hip fracture risk: 0.2%. These are under the thresholds for treatment of 20% and 3%, respectively. Comments: the technical quality of the study is good. Evaluation for secondary causes should be considered if clinically indicated. Recommend optimizing calcium (1200 mg/day) and vitamin D (800 IU/day) intake. Followup: Repeat BMD is appropriate after 2 years or after 1-2 years if starting treatment. WHO criteria for diagnosis of osteoporosis in postmenopausal women and in men 5 y/o or older: - normal: T-score -1.0 to + 1.0 - osteopenia/low bone density: T-score between -2.5 and -1.0 - osteoporosis: T-score below -2.5 -  severe osteoporosis: T-score below -2.5 with history of fragility fracture Note: although not part of the WHO classification, the presence of a fragility fracture, regardless of the T-score, should be considered diagnostic of osteoporosis, provided other causes for the fracture have been excluded. Treatment: The National Osteoporosis Foundation recommends that treatment be considered in postmenopausal women and men age 57 or older with: 1. Hip or vertebral (clinical or morphometric) fracture 2. T-score of - 2.5 or lower at the spine or hip 3. 10-year fracture probability by FRAX of at least 20% for a major osteoporotic fracture and 3% for a hip fracture Loura Pardon MD    Assessment & Plan:   There are no diagnoses linked to this encounter. I have discontinued Ms. Goodall's Clobetasol Propionate Emulsion, triamcinolone, hydrOXYzine, and triamterene-hydrochlorothiazide. I am also having her maintain her ferrous sulfate, fish  oil-omega-3 fatty acids, Vitamin B-12, hydrocortisone, loratadine, fluticasone, traMADol, potassium chloride, cholecalciferol, losartan, amLODipine, LORazepam, and pantoprazole. We will continue to administer methylPREDNISolone acetate and methylPREDNISolone acetate.  No orders of the defined types were placed in this encounter.    Follow-up: No Follow-up on file.  [name], MD

## 2017-02-22 NOTE — Assessment & Plan Note (Signed)
On B12 

## 2017-02-22 NOTE — Assessment & Plan Note (Signed)
Cont w/Amlodipine, Losartan, Triamt-HCTZ Labs

## 2017-02-22 NOTE — Assessment & Plan Note (Signed)
Metamucil Miralax prn

## 2017-02-22 NOTE — Assessment & Plan Note (Signed)
On Vit D 

## 2017-02-22 NOTE — Assessment & Plan Note (Signed)
Sports Med ref to consider injections Ibuprofen prn

## 2017-02-22 NOTE — Patient Instructions (Signed)
MC well w/Jill 

## 2017-03-19 ENCOUNTER — Ambulatory Visit: Payer: Medicare Other | Admitting: Family Medicine

## 2017-05-15 ENCOUNTER — Other Ambulatory Visit: Payer: Self-pay | Admitting: Internal Medicine

## 2017-05-16 NOTE — Telephone Encounter (Signed)
Routing to dr plotniklov, please advise, thanks

## 2017-05-17 NOTE — Telephone Encounter (Signed)
Called into [facility], left voicemail

## 2017-05-20 ENCOUNTER — Telehealth: Payer: Self-pay | Admitting: Internal Medicine

## 2017-05-20 NOTE — Telephone Encounter (Signed)
Pt is trying to get life insurance and needs an APS(attending physician statement) from [place].   Please call back in regard

## 2017-05-21 NOTE — Telephone Encounter (Signed)
Pt called back checking on this  °

## 2017-05-21 NOTE — Telephone Encounter (Signed)
Pt notified, letter written

## 2017-06-13 ENCOUNTER — Encounter: Payer: Self-pay | Admitting: Internal Medicine

## 2017-06-13 ENCOUNTER — Ambulatory Visit (INDEPENDENT_AMBULATORY_CARE_PROVIDER_SITE_OTHER): Payer: Medicare Other | Admitting: Internal Medicine

## 2017-06-13 DIAGNOSIS — L0201 Cutaneous abscess of face: Secondary | ICD-10-CM

## 2017-06-13 MED ORDER — DOXYCYCLINE HYCLATE 100 MG PO TABS: 100.0000 mg | ORAL_TABLET | Freq: Two times a day (BID) | ORAL | 0 refills | Status: DC

## 2017-06-13 NOTE — Progress Notes (Signed)
Subjective:  Patient ID: Jane Mooney, female    DOB: Nov 21, 1950  Age: 66 y.o. MRN: [id number]  CC: No chief complaint on file.   HPI Jane Mooney presents for a boil on the forehead x 3 d - pain and itching. The redness had spread down, then got better. No fever  Outpatient Medications Prior to Visit  Medication Sig Dispense Refill  . amLODipine (NORVASC) 10 MG tablet Take 1 tablet (10 mg total) by mouth daily. 90 tablet 3  . cholecalciferol (VITAMIN D) 1000 units tablet Take 2 tablets (2,000 Units total) by mouth daily. 100 tablet 11  . Cyanocobalamin (VITAMIN B-12) 1000 MCG SUBL Place 1 tablet (1,000 mcg total) under the tongue daily. 100 tablet 3  . ferrous sulfate (FERRO-BOB) 325 (65 FE) MG tablet Take 325 mg by mouth daily. For anemia     . fish oil-omega-3 fatty acids 1000 MG capsule Take 1 g by mouth daily.      . fluticasone (FLONASE) 50 MCG/ACT nasal spray Place 2 sprays into both nostrils 2 (two) times daily. Decrease to 2 sprays/nostril daily after 5 days 16 g 2  . hydrocortisone (ANUSOL-HC) 2.5 % rectal cream Apply rectally 2 times daily    . loratadine (CLARITIN) 10 MG tablet Take 1 tablet (10 mg total) by mouth daily. 100 tablet 3  . LORazepam (ATIVAN) 1 MG tablet TAKE 1 TABLET BY MOUTH TWICE A DAY AS NEEDED FOR ANXIETY 60 tablet 2  . losartan (COZAAR) 100 MG tablet Take 1 tablet (100 mg total) by mouth daily. 90 tablet 3  . pantoprazole (PROTONIX) 40 MG tablet Take 1 tablet (40 mg total) by mouth 2 (two) times daily. 60 tablet 5  . potassium chloride (KLOR-CON M10) 10 MEQ tablet TAKE 1 TABLET DAILY 30 tablet 11   Facility-Administered Medications Prior to Visit  Medication Dose Route Frequency Provider Last Rate Last Dose  . methylPREDNISolone acetate (DEPO-MEDROL) injection 40 mg  40 mg Intra-articular Once Othel Dicostanzo V, MD      . methylPREDNISolone acetate (DEPO-MEDROL) injection 80 mg  80 mg Intra-articular Once Opha Mcghee, Evie Lacks, MD        ROS Review  of Systems  Constitutional: Negative for activity change, appetite change, chills, fatigue and unexpected weight change.  HENT: Negative for congestion, mouth sores and sinus pressure.   Eyes: Negative for visual disturbance.  Respiratory: Negative for cough and chest tightness.   Gastrointestinal: Negative for abdominal pain and nausea.  Genitourinary: Negative for difficulty urinating, frequency and vaginal pain.  Musculoskeletal: Negative for back pain and gait problem.  Skin: Positive for color change. Negative for pallor and rash.  Neurological: Negative for dizziness, tremors, weakness, numbness and headaches.  Psychiatric/Behavioral: Negative for confusion and sleep disturbance.    Objective:  BP 128/72 (BP Location: Left Arm, Patient Position: Sitting, Cuff Size: Large)   Pulse (!) 54   Temp 98.6 F (37 C) (Oral)   Ht 5' 3.5" (1.613 m)   Wt 174 lb (78.9 kg)   SpO2 99%   BMI 30.34 kg/m   BP Readings from Last 3 Encounters:  06/13/17 128/72  02/22/17 132/68  11/30/16 120/70    Wt Readings from Last 3 Encounters:  06/13/17 174 lb (78.9 kg)  02/22/17 177 lb 0.6 oz (80.3 kg)  11/30/16 175 lb (79.4 kg)    Physical Exam  Constitutional: No distress.  Skin: There is erythema.  1 cm abscess on the forehead  Procedure note:  Incision and Drainage  of an Abscess   Indication : a localized collection of pus that is tender and not spontaneously resolving.    Risks including unsuccessful procedure , possible need for a repeat procedure due to pus accumulation, scar formation, and others as well as benefits were explained to the patient in detail. Written consent was obtained/signed.    The patient was placed in a decubitus position. The area of an abscess was prepped with povidone-iodine and draped in a sterile fashion. Local anesthesia with 1   cc of 2% lidocaine and epinephrine  was administered.  <1 cm incision with #11strait blade was made. About 0.5 cc of purulent  material was expressed. The abscess cavity was explored with a sterile hemostat and the walled- off pockets and septae were broken down bluntly. The cavity was irrigated with the rest of the anesthetic in the syringe.   The wound was dressed with antibiotic ointment and Band-Aid.  Tolerated well. Complications: None.   Wound instructions provided.   Please contact us if you notice a recollection of pus in the abscess fever and chills increased pain redness red streaks near the abscess increased swelling in the area.   Lab Results  Component Value Date   WBC 4.8 09/20/2016   HGB 12.1 09/20/2016   HCT 36.1 09/20/2016   PLT 210.0 09/20/2016   GLUCOSE 107 (H) 02/22/2017   CHOL 222 (H) 09/20/2016   TRIG 134.0 09/20/2016   HDL 51.30 09/20/2016   LDLDIRECT 153.6 07/06/2013   LDLCALC 144 (H) 09/20/2016   ALT 10 09/20/2016   AST 16 09/20/2016   NA 143 02/22/2017   K 3.7 02/22/2017   CL 105 02/22/2017   CREATININE 1.06 02/22/2017   BUN 19 02/22/2017   CO2 32 02/22/2017   TSH 2.34 09/20/2016   HGBA1C 5.8 07/28/2014    Dg Bone Density (dxa)  Result Date: 10/28/2016 Date of study: 10/23/16 Exam: DUAL X-RAY ABSORPTIOMETRY (DXA) FOR BONE MINERAL DENSITY (BMD) Instrument: [employer] Provider: PCP Indication: follow up for low BMD Comparison: none (please note that it is not possible to compare data from different instruments) Clinical data: Pt is a 66 y.o. female without previous history of fracture. On calcium and vitamin D. Results:  Lumbar spine (L1-L4) Femoral neck (FN) 33% distal radius T-score -1.8 RFN:-0.4 LFN:-0.5 n/a Change in BMD from previous DXA test (%) n/a n/a n/a (*) statistically significant Assessment: the BMD is low according to the [facility] classification for osteoporosis (see below). Fracture risk: moderate FRAX score: 10 year major osteoporotic risk: 3.3%. 10 year hip fracture risk: 0.2%. These are under the thresholds for treatment of 20% and 3%, respectively.  Comments: the technical quality of the study is good. Evaluation for secondary causes should be considered if clinically indicated. Recommend optimizing calcium (1200 mg/day) and vitamin D (800 IU/day) intake. Followup: Repeat BMD is appropriate after 2 years or after 1-2 years if starting treatment. WHO criteria for diagnosis of osteoporosis in postmenopausal women and in men 51 y/o or older: - normal: T-score -1.0 to + 1.0 - osteopenia/low bone density: T-score between -2.5 and -1.0 - osteoporosis: T-score below -2.5 - severe osteoporosis: T-score below -2.5 with history of fragility fracture Note: although not part of the WHO classification, the presence of a fragility fracture, regardless of the T-score, should be considered diagnostic of osteoporosis, provided other causes for the fracture have been excluded. Treatment: The National Osteoporosis Foundation recommends that treatment be considered in postmenopausal women and men age 63 or  older with: 1. Hip or vertebral (clinical or morphometric) fracture 2. T-score of - 2.5 or lower at the spine or hip 3. 10-year fracture probability by FRAX of at least 20% for a major osteoporotic fracture and 3% for a hip fracture Loura Pardon MD    Assessment & Plan:   Diagnoses and all orders for this visit:  Abscess of face  Other orders -     doxycycline (VIBRA-TABS) 100 MG tablet; Take 1 tablet (100 mg total) by mouth 2 (two) times daily.   I am having Ms. Hutcherson start on doxycycline. I am also having her maintain her ferrous sulfate, fish oil-omega-3 fatty acids, Vitamin B-12, hydrocortisone, loratadine, fluticasone, potassium chloride, cholecalciferol, losartan, amLODipine, pantoprazole, and LORazepam. We will continue to administer methylPREDNISolone acetate and methylPREDNISolone acetate.  Meds ordered this encounter  Medications  . doxycycline (VIBRA-TABS) 100 MG tablet    Sig: Take 1 tablet (100 mg total) by mouth 2 (two) times daily.    Dispense:   14 tablet    Refill:  0     Follow-up: No Follow-up on file.  [name], MD

## 2017-06-13 NOTE — Assessment & Plan Note (Signed)
I&D Doxy Tylenol prn

## 2017-07-23 ENCOUNTER — Ambulatory Visit (INDEPENDENT_AMBULATORY_CARE_PROVIDER_SITE_OTHER): Payer: Medicare Other | Admitting: General Practice

## 2017-07-23 DIAGNOSIS — Z23 Encounter for immunization: Secondary | ICD-10-CM

## 2017-08-30 ENCOUNTER — Ambulatory Visit: Payer: Medicare Other | Admitting: Internal Medicine

## 2017-10-10 ENCOUNTER — Other Ambulatory Visit: Payer: Self-pay | Admitting: Internal Medicine

## 2017-11-20 ENCOUNTER — Other Ambulatory Visit: Payer: Self-pay | Admitting: Internal Medicine

## 2017-11-21 ENCOUNTER — Other Ambulatory Visit: Payer: Self-pay | Admitting: Internal Medicine

## 2018-04-04 ENCOUNTER — Other Ambulatory Visit: Payer: Self-pay | Admitting: Internal Medicine

## 2018-04-09 NOTE — Telephone Encounter (Signed)
I will refill for one month but please call her and get to schedule follow-up with Dr. Alain Marion. No further refills without OV.

## 2018-06-18 ENCOUNTER — Ambulatory Visit (INDEPENDENT_AMBULATORY_CARE_PROVIDER_SITE_OTHER): Payer: Medicare Other | Admitting: Internal Medicine

## 2018-06-18 ENCOUNTER — Encounter: Payer: Self-pay | Admitting: Internal Medicine

## 2018-06-18 VITALS — BP 118/66 | HR 52 | Temp 98.3°F | Ht 63.5 in | Wt 170.0 lb

## 2018-06-18 DIAGNOSIS — I10 Essential (primary) hypertension: Secondary | ICD-10-CM | POA: Diagnosis not present

## 2018-06-18 DIAGNOSIS — Z0000 Encounter for general adult medical examination without abnormal findings: Secondary | ICD-10-CM | POA: Diagnosis not present

## 2018-06-18 DIAGNOSIS — F4321 Adjustment disorder with depressed mood: Secondary | ICD-10-CM

## 2018-06-18 DIAGNOSIS — E538 Deficiency of other specified B group vitamins: Secondary | ICD-10-CM

## 2018-06-18 MED ORDER — TRIAMTERENE-HCTZ 37.5-25 MG PO TABS: 1.0000 | ORAL_TABLET | Freq: Every day | ORAL | 3 refills | Status: DC

## 2018-06-18 MED ORDER — LOSARTAN POTASSIUM 100 MG PO TABS: 100.0000 mg | ORAL_TABLET | Freq: Every day | ORAL | 3 refills | Status: DC

## 2018-06-18 MED ORDER — POTASSIUM CHLORIDE CRYS ER 10 MEQ PO TBCR: EXTENDED_RELEASE_TABLET | ORAL | 11 refills | Status: DC

## 2018-06-18 MED ORDER — LORAZEPAM 1 MG PO TABS: 1.0000 mg | ORAL_TABLET | Freq: Two times a day (BID) | ORAL | 3 refills | Status: DC | PRN

## 2018-06-18 MED ORDER — AMLODIPINE BESYLATE 10 MG PO TABS: 10.0000 mg | ORAL_TABLET | Freq: Every day | ORAL | 3 refills | Status: DC

## 2018-06-18 NOTE — Assessment & Plan Note (Signed)
Discussed Stress w/son

## 2018-06-18 NOTE — Progress Notes (Signed)
Subjective:  Patient ID: Jane Mooney, female    DOB: 06/08/1951  Age: 67 y.o. MRN: [id number]  CC: No chief complaint on file.   HPI Jane Mooney presents for OA, HTN, B12 def. Not taking vitamins. C/o stress - worried about her son in [place]  Outpatient Medications Prior to Visit  Medication Sig Dispense Refill  . amLODipine (NORVASC) 10 MG tablet TAKE 1 TABLET BY MOUTH EVERY DAY 90 tablet 0  . cholecalciferol (VITAMIN D) 1000 units tablet Take 2 tablets (2,000 Units total) by mouth daily. 100 tablet 11  . Cyanocobalamin (VITAMIN B-12) 1000 MCG SUBL Place 1 tablet (1,000 mcg total) under the tongue daily. 100 tablet 3  . fish oil-omega-3 fatty acids 1000 MG capsule Take 1 g by mouth daily.      . fluticasone (FLONASE) 50 MCG/ACT nasal spray Place 2 sprays into both nostrils 2 (two) times daily. Decrease to 2 sprays/nostril daily after 5 days 16 g 2  . LORazepam (ATIVAN) 1 MG tablet Take 1 tablet (1 mg total) by mouth 2 (two) times daily as needed for anxiety. Patient needs office visit before refills will be given 60 tablet 0  . losartan (COZAAR) 100 MG tablet TAKE 1 TABLET BY MOUTH EVERY DAY 90 tablet 0  . potassium chloride (KLOR-CON M10) 10 MEQ tablet TAKE 1 TABLET DAILY 30 tablet 11  . triamterene-hydrochlorothiazide (MAXZIDE-25) 37.5-25 MG tablet TAKE 1 TABLET BY MOUTH EVERY DAY 90 tablet 0  . doxycycline (VIBRA-TABS) 100 MG tablet Take 1 tablet (100 mg total) by mouth 2 (two) times daily. 14 tablet 0  . ferrous sulfate (FERRO-BOB) 325 (65 FE) MG tablet Take 325 mg by mouth daily. For anemia     . hydrocortisone (ANUSOL-HC) 2.5 % rectal cream Apply rectally 2 times daily    . loratadine (CLARITIN) 10 MG tablet Take 1 tablet (10 mg total) by mouth daily. 100 tablet 3  . pantoprazole (PROTONIX) 40 MG tablet Take 1 tablet (40 mg total) by mouth 2 (two) times daily. 60 tablet 5   Facility-Administered Medications Prior to Visit  Medication Dose Route Frequency Provider Last Rate Last  Dose  . methylPREDNISolone acetate (DEPO-MEDROL) injection 40 mg  40 mg Intra-articular Once Dawan Farney V, MD      . methylPREDNISolone acetate (DEPO-MEDROL) injection 80 mg  80 mg Intra-articular Once Jasiyah Paulding, Evie Lacks, MD        ROS: Review of Systems  Constitutional: Negative for activity change, appetite change, chills, fatigue and unexpected weight change.  HENT: Negative for congestion, mouth sores and sinus pressure.   Eyes: Negative for visual disturbance.  Respiratory: Negative for cough and chest tightness.   Gastrointestinal: Negative for abdominal pain and nausea.  Genitourinary: Negative for difficulty urinating, frequency and vaginal pain.  Musculoskeletal: Negative for back pain and gait problem.  Skin: Negative for pallor and rash.  Neurological: Negative for dizziness, tremors, weakness, numbness and headaches.  Psychiatric/Behavioral: Positive for decreased concentration. Negative for confusion, sleep disturbance and suicidal ideas. The patient is nervous/anxious.     Objective:  BP 118/66 (BP Location: Left Arm, Patient Position: Sitting, Cuff Size: Large)   Pulse (!) 52   Temp 98.3 F (36.8 C) (Oral)   Ht 5' 3.5" (1.613 m)   Wt 170 lb (77.1 kg)   SpO2 99%   BMI 29.64 kg/m   BP Readings from Last 3 Encounters:  06/18/18 118/66  06/13/17 128/72  02/22/17 132/68    Wt Readings from Last 3  Encounters:  06/18/18 170 lb (77.1 kg)  06/13/17 174 lb (78.9 kg)  02/22/17 177 lb 0.6 oz (80.3 kg)    Physical Exam  Constitutional: She appears well-developed. No distress.  HENT:  Head: Normocephalic.  Right Ear: External ear normal.  Left Ear: External ear normal.  Nose: Nose normal.  Mouth/Throat: Oropharynx is clear and moist.  Eyes: Pupils are equal, round, and reactive to light. Conjunctivae are normal. Right eye exhibits no discharge. Left eye exhibits no discharge.  Neck: Normal range of motion. Neck supple. No JVD present. No tracheal  deviation present. No thyromegaly present.  Cardiovascular: Normal rate, regular rhythm and normal heart sounds.  Pulmonary/Chest: No stridor. No respiratory distress. She has no wheezes.  Abdominal: Soft. Bowel sounds are normal. She exhibits no distension and no mass. There is no tenderness. There is no rebound and no guarding.  Musculoskeletal: She exhibits tenderness. She exhibits no edema.  Lymphadenopathy:    She has no cervical adenopathy.  Neurological: She displays normal reflexes. No cranial nerve deficit. She exhibits normal muscle tone. Coordination normal.  Skin: No rash noted. No erythema.  Psychiatric: She has a normal mood and affect. Her behavior is normal. Judgment and thought content normal.    Lab Results  Component Value Date   WBC 4.8 09/20/2016   HGB 12.1 09/20/2016   HCT 36.1 09/20/2016   PLT 210.0 09/20/2016   GLUCOSE 107 (H) 02/22/2017   CHOL 222 (H) 09/20/2016   TRIG 134.0 09/20/2016   HDL 51.30 09/20/2016   LDLDIRECT 153.6 07/06/2013   LDLCALC 144 (H) 09/20/2016   ALT 10 09/20/2016   AST 16 09/20/2016   NA 143 02/22/2017   K 3.7 02/22/2017   CL 105 02/22/2017   CREATININE 1.06 02/22/2017   BUN 19 02/22/2017   CO2 32 02/22/2017   TSH 2.34 09/20/2016   HGBA1C 5.8 07/28/2014    Dg Bone Density (dxa)  Result Date: 10/28/2016 Date of study: 10/23/16 Exam: DUAL X-RAY ABSORPTIOMETRY (DXA) FOR BONE MINERAL DENSITY (BMD) Instrument: [employer] Provider: PCP Indication: follow up for low BMD Comparison: none (please note that it is not possible to compare data from different instruments) Clinical data: Pt is a 67 y.o. female without previous history of fracture. On calcium and vitamin D. Results:  Lumbar spine (L1-L4) Femoral neck (FN) 33% distal radius T-score -1.8 RFN:-0.4 LFN:-0.5 n/a Change in BMD from previous DXA test (%) n/a n/a n/a (*) statistically significant Assessment: the BMD is low according to the [facility] classification for  osteoporosis (see below). Fracture risk: moderate FRAX score: 10 year major osteoporotic risk: 3.3%. 10 year hip fracture risk: 0.2%. These are under the thresholds for treatment of 20% and 3%, respectively. Comments: the technical quality of the study is good. Evaluation for secondary causes should be considered if clinically indicated. Recommend optimizing calcium (1200 mg/day) and vitamin D (800 IU/day) intake. Followup: Repeat BMD is appropriate after 2 years or after 1-2 years if starting treatment. WHO criteria for diagnosis of osteoporosis in postmenopausal women and in men 91 y/o or older: - normal: T-score -1.0 to + 1.0 - osteopenia/low bone density: T-score between -2.5 and -1.0 - osteoporosis: T-score below -2.5 - severe osteoporosis: T-score below -2.5 with history of fragility fracture Note: although not part of the WHO classification, the presence of a fragility fracture, regardless of the T-score, should be considered diagnostic of osteoporosis, provided other causes for the fracture have been excluded. Treatment: The National Osteoporosis Foundation recommends that  treatment be considered in postmenopausal women and men age 54 or older with: 1. Hip or vertebral (clinical or morphometric) fracture 2. T-score of - 2.5 or lower at the spine or hip 3. 10-year fracture probability by FRAX of at least 20% for a major osteoporotic fracture and 3% for a hip fracture Loura Pardon MD    Assessment & Plan:   There are no diagnoses linked to this encounter.   No orders of the defined types were placed in this encounter.    Follow-up: No follow-ups on file.  [name], MD

## 2018-06-18 NOTE — Assessment & Plan Note (Signed)
On B12 

## 2018-06-18 NOTE — Assessment & Plan Note (Signed)
Amlodipine, Losartan, Triamt-HCTZ 

## 2018-06-18 NOTE — Assessment & Plan Note (Signed)
  On diet  

## 2018-09-18 ENCOUNTER — Encounter: Payer: Medicare Other | Admitting: Internal Medicine

## 2018-09-19 ENCOUNTER — Encounter: Payer: Self-pay | Admitting: Internal Medicine

## 2018-09-19 ENCOUNTER — Ambulatory Visit (INDEPENDENT_AMBULATORY_CARE_PROVIDER_SITE_OTHER): Payer: Medicare Other | Admitting: Internal Medicine

## 2018-09-19 ENCOUNTER — Other Ambulatory Visit (INDEPENDENT_AMBULATORY_CARE_PROVIDER_SITE_OTHER): Payer: Medicare Other

## 2018-09-19 VITALS — BP 122/78 | HR 49 | Temp 98.2°F | Ht 63.5 in | Wt 174.0 lb

## 2018-09-19 DIAGNOSIS — Z0000 Encounter for general adult medical examination without abnormal findings: Secondary | ICD-10-CM | POA: Diagnosis not present

## 2018-09-19 DIAGNOSIS — I10 Essential (primary) hypertension: Secondary | ICD-10-CM

## 2018-09-19 DIAGNOSIS — E538 Deficiency of other specified B group vitamins: Secondary | ICD-10-CM

## 2018-09-19 DIAGNOSIS — Z23 Encounter for immunization: Secondary | ICD-10-CM

## 2018-09-19 LAB — BASIC METABOLIC PANEL
BUN: 14 mg/dL (ref 6–23)
CALCIUM: 9.6 mg/dL (ref 8.4–10.5)
CO2: 31 mEq/L (ref 19–32)
Chloride: 104 mEq/L (ref 96–112)
Creatinine, Ser: 1.07 mg/dL (ref 0.40–1.20)
GFR: 65.35 mL/min (ref >60.00)
GLUCOSE: 98 mg/dL (ref 70–99)
Potassium: 4.2 mEq/L (ref 3.5–5.1)
SODIUM: 141 meq/L (ref 135–145)

## 2018-09-19 LAB — CBC WITH DIFFERENTIAL/PLATELET
Basophils Absolute: 0.1 10*3/uL (ref 0.0–0.1)
Basophils Relative: 1.1 % (ref 0.0–3.0)
EOS PCT: 2.6 % (ref 0.0–5.0)
Eosinophils Absolute: 0.1 10*3/uL (ref 0.0–0.7)
HEMATOCRIT: 36.8 % (ref 36.0–46.0)
HEMOGLOBIN: 12.2 g/dL (ref 12.0–15.0)
LYMPHS ABS: 1.9 10*3/uL (ref 0.7–4.0)
Lymphocytes Relative: 40.1 % (ref 12.0–46.0)
MCHC: 33.2 g/dL (ref 30.0–36.0)
MCV: 78.6 fl (ref 78.0–100.0)
MONOS PCT: 12.6 % — AB (ref 3.0–12.0)
Monocytes Absolute: 0.6 10*3/uL (ref 0.1–1.0)
NEUTROS PCT: 43.6 % (ref 43.0–77.0)
Neutro Abs: 2.1 10*3/uL (ref 1.4–7.7)
Platelets: 198 10*3/uL (ref 150.0–400.0)
RBC: 4.68 Mil/uL (ref 3.87–5.11)
RDW: 14.2 % (ref 11.5–15.5)
WBC: 4.8 10*3/uL (ref 4.0–10.5)

## 2018-09-19 LAB — URINALYSIS
Bilirubin Urine: NEGATIVE
Hgb urine dipstick: NEGATIVE
Ketones, ur: NEGATIVE
Leukocytes, UA: NEGATIVE
NITRITE: NEGATIVE
PH: 6.5 (ref 5.0–8.0)
SPECIFIC GRAVITY, URINE: 1.015 (ref 1.000–1.030)
TOTAL PROTEIN, URINE-UPE24: NEGATIVE
Urine Glucose: NEGATIVE
Urobilinogen, UA: 0.2 (ref 0.0–1.0)

## 2018-09-19 LAB — VITAMIN B12: Vitamin B-12: 399 pg/mL (ref 211–911)

## 2018-09-19 LAB — LIPID PANEL
Cholesterol: 206 mg/dL — ABNORMAL HIGH (ref 0–200)
HDL: 50.7 mg/dL (ref >39.00)
LDL Cholesterol: 132 mg/dL — ABNORMAL HIGH (ref 0–99)
NONHDL: 155.21
TRIGLYCERIDES: 117 mg/dL (ref 0.0–149.0)
Total CHOL/HDL Ratio: 4
VLDL: 23.4 mg/dL (ref 0.0–40.0)

## 2018-09-19 LAB — HEPATIC FUNCTION PANEL
ALBUMIN: 4.3 g/dL (ref 3.5–5.2)
ALT: 9 U/L (ref 0–35)
AST: 14 U/L (ref 0–37)
Alkaline Phosphatase: 68 U/L (ref 39–117)
Bilirubin, Direct: 0.1 mg/dL (ref 0.0–0.3)
Total Bilirubin: 0.5 mg/dL (ref 0.2–1.2)
Total Protein: 7.7 g/dL (ref 6.0–8.3)

## 2018-09-19 LAB — TSH: TSH: 2.22 u[IU]/mL (ref 0.35–4.50)

## 2018-09-19 MED ORDER — ZOSTER VAC RECOMB ADJUVANTED 50 MCG/0.5ML IM SUSR
0.5000 mL | Freq: Once | INTRAMUSCULAR | 1 refills | Status: AC
Start: 2018-09-19 — End: 2018-09-19

## 2018-09-19 NOTE — Assessment & Plan Note (Signed)
On B12 

## 2018-09-19 NOTE — Assessment & Plan Note (Addendum)
Here for medicare wellness/physical  Diet: heart healthy  Physical activity: not sedentary  Depression/mood screen: sad Hearing: intact to whispered voice  Visual acuity: grossly normal - reading glasses, performs annual eye exam  ADLs: capable  Fall risk: low to none  Home safety: good  Cognitive evaluation: intact to orientation, naming, recall and repetition  EOL planning: adv directives, full code/ I agree  I have personally reviewed and have noted  1. The patient's medical, surgical and social history  2. Their use of alcohol, tobacco or illicit drugs  3. Their current medications and supplements  4. The patient's functional ability including ADL's, fall risks, home safety risks and hearing or visual impairment.  5. Diet and physical activities  6. Evidence for depression or mood disorders 7. The roster of all physicians providing medical care to patient - is listed in the Snapshot section of the chart and reviewed today.    Today patient counseled on age appropriate routine health concerns for screening and prevention, each reviewed and up to date or declined. Immunizations reviewed and up to date or declined. Labs ordered and reviewed. Risk factors for depression reviewed and negative. Hearing function and visual acuity are intact. ADLs screened and addressed as needed. Functional ability and level of safety reviewed and appropriate. Education, counseling and referrals performed based on assessed risks today. Patient provided with a copy of personalized plan for preventive services.  Mammogr/GYN - pending Shingrix rx

## 2018-09-19 NOTE — Progress Notes (Signed)
Subjective:  Patient ID: Jane Mooney, female    DOB: 1951-09-09  Age: 67 y.o. MRN: [id number]  CC: No chief complaint on file.   HPI Jane Mooney presents for a well exam  Outpatient Medications Prior to Visit  Medication Sig Dispense Refill  . amLODipine (NORVASC) 10 MG tablet Take 1 tablet (10 mg total) by mouth daily. 90 tablet 3  . cholecalciferol (VITAMIN D) 1000 units tablet Take 2 tablets (2,000 Units total) by mouth daily. 100 tablet 11  . Cyanocobalamin (VITAMIN B-12) 1000 MCG SUBL Place 1 tablet (1,000 mcg total) under the tongue daily. 100 tablet 3  . fish oil-omega-3 fatty acids 1000 MG capsule Take 1 g by mouth daily.      [name] LORazepam (ATIVAN) 1 MG tablet Take 1 tablet (1 mg total) by mouth 2 (two) times daily as needed for anxiety. Patient needs office visit before refills will be given 60 tablet 3  . losartan (COZAAR) 100 MG tablet Take 1 tablet (100 mg total) by mouth daily. 90 tablet 3  . triamterene-hydrochlorothiazide (MAXZIDE-25) 37.5-25 MG tablet Take 1 tablet by mouth daily. 90 tablet 3  . fluticasone (FLONASE) 50 MCG/ACT nasal spray Place 2 sprays into both nostrils 2 (two) times daily. Decrease to 2 sprays/nostril daily after 5 days 16 g 2  . potassium chloride (KLOR-CON M10) 10 MEQ tablet TAKE 1 TABLET DAILY 30 tablet 11   Facility-Administered Medications Prior to Visit  Medication Dose Route Frequency Provider Last Rate Last Dose  . methylPREDNISolone acetate (DEPO-MEDROL) injection 40 mg  40 mg Intra-articular Once Plotnikov, Aleksei V, MD      . methylPREDNISolone acetate (DEPO-MEDROL) injection 80 mg  80 mg Intra-articular Once Plotnikov, Evie Lacks, MD        ROS: Review of Systems  Constitutional: Negative for activity change, appetite change, chills, fatigue and unexpected weight change.  HENT: Negative for congestion, mouth sores and sinus pressure.   Eyes: Negative for visual disturbance.  Respiratory: Negative for cough and chest tightness.     Gastrointestinal: Negative for abdominal pain and nausea.  Genitourinary: Negative for difficulty urinating, frequency and vaginal pain.  Musculoskeletal: Positive for arthralgias and gait problem. Negative for back pain.  Skin: Negative for pallor and rash.  Neurological: Negative for dizziness, tremors, weakness, numbness and headaches.  Psychiatric/Behavioral: Negative for confusion and sleep disturbance.    Objective:  BP 122/78 (BP Location: Left Arm, Patient Position: Sitting, Cuff Size: Normal)   Pulse (!) 49   Temp 98.2 F (36.8 C) (Oral)   Ht 5' 3.5" (1.613 m)   Wt 174 lb (78.9 kg)   SpO2 99%   BMI 30.34 kg/m   BP Readings from Last 3 Encounters:  09/19/18 122/78  06/18/18 118/66  06/13/17 128/72    Wt Readings from Last 3 Encounters:  09/19/18 174 lb (78.9 kg)  06/18/18 170 lb (77.1 kg)  06/13/17 174 lb (78.9 kg)    Physical Exam  Constitutional: She appears well-developed. No distress.  HENT:  Head: Normocephalic.  Right Ear: External ear normal.  Left Ear: External ear normal.  Nose: Nose normal.  Mouth/Throat: Oropharynx is clear and moist.  Eyes: Pupils are equal, round, and reactive to light. Conjunctivae are normal. Right eye exhibits no discharge. Left eye exhibits no discharge.  Neck: Normal range of motion. Neck supple. No JVD present. No tracheal deviation present. No thyromegaly present.  Cardiovascular: Normal rate, regular rhythm and normal heart sounds.  Pulmonary/Chest: No stridor. No respiratory  distress. She has no wheezes.  Abdominal: Soft. Bowel sounds are normal. She exhibits no distension and no mass. There is no tenderness. There is no rebound and no guarding.  Musculoskeletal: She exhibits tenderness. She exhibits no edema.  Lymphadenopathy:    She has no cervical adenopathy.  Neurological: She displays normal reflexes. No cranial nerve deficit. She exhibits normal muscle tone. Coordination normal.  Skin: No rash noted. No erythema.   Psychiatric: She has a normal mood and affect. Her behavior is normal. Judgment and thought content normal.  B knee pain w/ROM obese  Lab Results  Component Value Date   WBC 4.8 09/20/2016   HGB 12.1 09/20/2016   HCT 36.1 09/20/2016   PLT 210.0 09/20/2016   GLUCOSE 107 (H) 02/22/2017   CHOL 222 (H) 09/20/2016   TRIG 134.0 09/20/2016   HDL 51.30 09/20/2016   LDLDIRECT 153.6 07/06/2013   LDLCALC 144 (H) 09/20/2016   ALT 10 09/20/2016   AST 16 09/20/2016   NA 143 02/22/2017   K 3.7 02/22/2017   CL 105 02/22/2017   CREATININE 1.06 02/22/2017   BUN 19 02/22/2017   CO2 32 02/22/2017   TSH 2.34 09/20/2016   HGBA1C 5.8 07/28/2014    Dg Bone Density (dxa)  Result Date: 10/28/2016 Date of study: 10/23/16 Exam: DUAL X-RAY ABSORPTIOMETRY (DXA) FOR BONE MINERAL DENSITY (BMD) Instrument: [employer] Provider: PCP Indication: follow up for low BMD Comparison: none (please note that it is not possible to compare data from different instruments) Clinical data: Pt is a 67 y.o. female without previous history of fracture. On calcium and vitamin D. Results:  Lumbar spine (L1-L4) Femoral neck (FN) 33% distal radius T-score -1.8 RFN:-0.4 LFN:-0.5 n/a Change in BMD from previous DXA test (%) n/a n/a n/a (*) statistically significant Assessment: the BMD is low according to the [facility] classification for osteoporosis (see below). Fracture risk: moderate FRAX score: 10 year major osteoporotic risk: 3.3%. 10 year hip fracture risk: 0.2%. These are under the thresholds for treatment of 20% and 3%, respectively. Comments: the technical quality of the study is good. Evaluation for secondary causes should be considered if clinically indicated. Recommend optimizing calcium (1200 mg/day) and vitamin D (800 IU/day) intake. Followup: Repeat BMD is appropriate after 2 years or after 1-2 years if starting treatment. WHO criteria for diagnosis of osteoporosis in postmenopausal women and in men 17 y/o or  older: - normal: T-score -1.0 to + 1.0 - osteopenia/low bone density: T-score between -2.5 and -1.0 - osteoporosis: T-score below -2.5 - severe osteoporosis: T-score below -2.5 with history of fragility fracture Note: although not part of the WHO classification, the presence of a fragility fracture, regardless of the T-score, should be considered diagnostic of osteoporosis, provided other causes for the fracture have been excluded. Treatment: The National Osteoporosis Foundation recommends that treatment be considered in postmenopausal women and men age 64 or older with: 1. Hip or vertebral (clinical or morphometric) fracture 2. T-score of - 2.5 or lower at the spine or hip 3. 10-year fracture probability by FRAX of at least 20% for a major osteoporotic fracture and 3% for a hip fracture Loura Pardon MD    Assessment & Plan:   There are no diagnoses linked to this encounter.   No orders of the defined types were placed in this encounter.    Follow-up: No follow-ups on file.  [name], MD

## 2018-09-19 NOTE — Addendum Note (Signed)
Addended by: Karren Cobble on: 09/19/2018 12:27 PM   Modules accepted: Orders

## 2018-09-19 NOTE — Patient Instructions (Signed)
Health Maintenance for Postmenopausal Women Menopause is a normal process in which your reproductive ability comes to an end. This process happens gradually over a span of months to years, usually between the ages of 22 and 9. Menopause is complete when you have missed 12 consecutive menstrual periods. It is important to talk with your health care provider about some of the most common conditions that affect postmenopausal women, such as heart disease, cancer, and bone loss (osteoporosis). Adopting a healthy lifestyle and getting preventive care can help to promote your health and wellness. Those actions can also lower your chances of developing some of these common conditions. What should I know about menopause? During menopause, you may experience a number of symptoms, such as:  Moderate-to-severe hot flashes.  Night sweats.  Decrease in sex drive.  Mood swings.  Headaches.  Tiredness.  Irritability.  Memory problems.  Insomnia.  Choosing to treat or not to treat menopausal changes is an individual decision that you make with your health care provider. What should I know about hormone replacement therapy and supplements? Hormone therapy products are effective for treating symptoms that are associated with menopause, such as hot flashes and night sweats. Hormone replacement carries certain risks, especially as you become older. If you are thinking about using estrogen or estrogen with progestin treatments, discuss the benefits and risks with your health care provider. What should I know about heart disease and stroke? Heart disease, heart attack, and stroke become more likely as you age. This may be due, in part, to the hormonal changes that your body experiences during menopause. These can affect how your body processes dietary fats, triglycerides, and cholesterol. Heart attack and stroke are both medical emergencies. There are many things that you can do to help prevent heart disease  and stroke:  Have your blood pressure checked at least every 1-2 years. High blood pressure causes heart disease and increases the risk of stroke.  If you are 53-22 years old, ask your health care provider if you should take aspirin to prevent a heart attack or a stroke.  Do not use any tobacco products, including cigarettes, chewing tobacco, or electronic cigarettes. If you need help quitting, ask your health care provider.  It is important to eat a healthy diet and maintain a healthy weight. ? Be sure to include plenty of vegetables, fruits, low-fat dairy products, and lean protein. ? Avoid eating foods that are high in solid fats, added sugars, or salt (sodium).  Get regular exercise. This is one of the most important things that you can do for your health. ? Try to exercise for at least 150 minutes each week. The type of exercise that you do should increase your heart rate and make you sweat. This is known as moderate-intensity exercise. ? Try to do strengthening exercises at least twice each week. Do these in addition to the moderate-intensity exercise.  Know your numbers.Ask your health care provider to check your cholesterol and your blood glucose. Continue to have your blood tested as directed by your health care provider.  What should I know about cancer screening? There are several types of cancer. Take the following steps to reduce your risk and to catch any cancer development as early as possible. Breast Cancer  Practice breast self-awareness. ? This means understanding how your breasts normally appear and feel. ? It also means doing regular breast self-exams. Let your health care provider know about any changes, no matter how small.  If you are 40  or older, have a clinician do a breast exam (clinical breast exam or CBE) every year. Depending on your age, family history, and medical history, it may be recommended that you also have a yearly breast X-ray (mammogram).  If you  have a family history of breast cancer, talk with your health care provider about genetic screening.  If you are at high risk for breast cancer, talk with your health care provider about having an MRI and a mammogram every year.  Breast cancer (BRCA) gene test is recommended for women who have family members with BRCA-related cancers. Results of the assessment will determine the need for genetic counseling and BRCA1 and for BRCA2 testing. BRCA-related cancers include these types: ? Breast. This occurs in males or females. ? Ovarian. ? Tubal. This may also be called fallopian tube cancer. ? Cancer of the abdominal or pelvic lining (peritoneal cancer). ? Prostate. ? Pancreatic.  Cervical, Uterine, and Ovarian Cancer Your health care provider may recommend that you be screened regularly for cancer of the pelvic organs. These include your ovaries, uterus, and vagina. This screening involves a pelvic exam, which includes checking for microscopic changes to the surface of your cervix (Pap test).  For women ages 21-65, health care providers may recommend a pelvic exam and a Pap test every three years. For women ages 79-65, they may recommend the Pap test and pelvic exam, combined with testing for human papilloma virus (HPV), every five years. Some types of HPV increase your risk of cervical cancer. Testing for HPV may also be done on women of any age who have unclear Pap test results.  Other health care providers may not recommend any screening for nonpregnant women who are considered low risk for pelvic cancer and have no symptoms. Ask your health care provider if a screening pelvic exam is right for you.  If you have had past treatment for cervical cancer or a condition that could lead to cancer, you need Pap tests and screening for cancer for at least 20 years after your treatment. If Pap tests have been discontinued for you, your risk factors (such as having a new sexual partner) need to be  reassessed to determine if you should start having screenings again. Some women have medical problems that increase the chance of getting cervical cancer. In these cases, your health care provider may recommend that you have screening and Pap tests more often.  If you have a family history of uterine cancer or ovarian cancer, talk with your health care provider about genetic screening.  If you have vaginal bleeding after reaching menopause, tell your health care provider.  There are currently no reliable tests available to screen for ovarian cancer.  Lung Cancer Lung cancer screening is recommended for adults 69-62 years old who are at high risk for lung cancer because of a history of smoking. A yearly low-dose CT scan of the lungs is recommended if you:  Currently smoke.  Have a history of at least 30 pack-years of smoking and you currently smoke or have quit within the past 15 years. A pack-year is smoking an average of one pack of cigarettes per day for one year.  Yearly screening should:  Continue until it has been 15 years since you quit.  Stop if you develop a health problem that would prevent you from having lung cancer treatment.  Colorectal Cancer  This type of cancer can be detected and can often be prevented.  Routine colorectal cancer screening usually begins at  age 42 and continues through age 45.  If you have risk factors for colon cancer, your health care provider may recommend that you be screened at an earlier age.  If you have a family history of colorectal cancer, talk with your health care provider about genetic screening.  Your health care provider may also recommend using home test kits to check for hidden blood in your stool.  A small camera at the end of a tube can be used to examine your colon directly (sigmoidoscopy or colonoscopy). This is done to check for the earliest forms of colorectal cancer.  Direct examination of the colon should be repeated every  5-10 years until age 71. However, if early forms of precancerous polyps or small growths are found or if you have a family history or genetic risk for colorectal cancer, you may need to be screened more often.  Skin Cancer  Check your skin from head to toe regularly.  Monitor any moles. Be sure to tell your health care provider: ? About any new moles or changes in moles, especially if there is a change in a mole's shape or color. ? If you have a mole that is larger than the size of a pencil eraser.  If any of your family members has a history of skin cancer, especially at a young age, talk with your health care provider about genetic screening.  Always use sunscreen. Apply sunscreen liberally and repeatedly throughout the day.  Whenever you are outside, protect yourself by wearing long sleeves, pants, a wide-brimmed hat, and sunglasses.  What should I know about osteoporosis? Osteoporosis is a condition in which bone destruction happens more quickly than new bone creation. After menopause, you may be at an increased risk for osteoporosis. To help prevent osteoporosis or the bone fractures that can happen because of osteoporosis, the following is recommended:  If you are 46-71 years old, get at least 1,000 mg of calcium and at least 600 mg of vitamin D per day.  If you are older than age 55 but younger than age 65, get at least 1,200 mg of calcium and at least 600 mg of vitamin D per day.  If you are older than age 54, get at least 1,200 mg of calcium and at least 800 mg of vitamin D per day.  Smoking and excessive alcohol intake increase the risk of osteoporosis. Eat foods that are rich in calcium and vitamin D, and do weight-bearing exercises several times each week as directed by your health care provider. What should I know about how menopause affects my mental health? Depression may occur at any age, but it is more common as you become older. Common symptoms of depression  include:  Low or sad mood.  Changes in sleep patterns.  Changes in appetite or eating patterns.  Feeling an overall lack of motivation or enjoyment of activities that you previously enjoyed.  Frequent crying spells.  Talk with your health care provider if you think that you are experiencing depression. What should I know about immunizations? It is important that you get and maintain your immunizations. These include:  Tetanus, diphtheria, and pertussis (Tdap) booster vaccine.  Influenza every year before the flu season begins.  Pneumonia vaccine.  Shingles vaccine.  Your health care provider may also recommend other immunizations. This information is not intended to replace advice given to you by your health care provider. Make sure you discuss any questions you have with your health care provider. Document Released: 12/21/2005  Document Revised: 05/18/2016 Document Reviewed: 08/02/2015 Elsevier Interactive Patient Education  2018 [employer]  

## 2018-09-19 NOTE — Assessment & Plan Note (Signed)
Amlodipine, Losartan, Triamt-HCTZ

## 2019-01-06 ENCOUNTER — Encounter: Payer: Self-pay | Admitting: Family

## 2019-01-06 ENCOUNTER — Other Ambulatory Visit: Payer: Self-pay | Admitting: Internal Medicine

## 2019-01-06 ENCOUNTER — Ambulatory Visit (INDEPENDENT_AMBULATORY_CARE_PROVIDER_SITE_OTHER): Payer: Medicare Other | Admitting: Family

## 2019-01-06 ENCOUNTER — Ambulatory Visit: Payer: Self-pay

## 2019-01-06 VITALS — BP 128/82 | HR 52 | Temp 98.4°F | Ht 63.5 in | Wt 175.1 lb

## 2019-01-06 DIAGNOSIS — J9801 Acute bronchospasm: Secondary | ICD-10-CM | POA: Diagnosis not present

## 2019-01-06 DIAGNOSIS — J209 Acute bronchitis, unspecified: Secondary | ICD-10-CM | POA: Diagnosis not present

## 2019-01-06 MED ORDER — AZITHROMYCIN 250 MG PO TABS: ORAL_TABLET | ORAL | 0 refills | Status: DC

## 2019-01-06 MED ORDER — HYDROCODONE-HOMATROPINE 5-1.5 MG/5ML PO SYRP: 5.0000 mL | ORAL_SOLUTION | Freq: Three times a day (TID) | ORAL | 0 refills | Status: DC | PRN

## 2019-01-06 NOTE — Progress Notes (Signed)
Jane Mooney is a 68 y.o. female with the following history as recorded in [employer]:  Patient Active Problem List   Diagnosis Date Noted  . Abscess of face 06/13/2017  . Constipation 02/22/2017  . Chest pain, atypical 11/30/2016  . GERD (gastroesophageal reflux disease) 11/30/2016  . Menopause 10/23/2016  . Abscess of finger, left 08/17/2016  . Thoracic back pain 09/29/2015  . Osteoarthritis of both knees 09/29/2015  . Allergic urticaria 03/29/2014  . Grief 03/22/2014  . Urticaria 01/18/2014  . Left conjunctivitis 09/02/2013  . Acute upper respiratory infections of unspecified site 09/02/2013  . Well adult exam 07/02/2012  . Obesity 03/06/2012  . Cervical pain (neck) 12/28/2011  . Neoplasm of uncertain behavior of skin 08/03/2011  . Onychomycosis 08/03/2011  . URI, acute 06/20/2011  . Vitamin B 12 deficiency 05/07/2011  . Hemorrhoid 05/03/2011  . HYPERLIPIDEMIA 10/31/2010  . TRIGGER FINGER 03/30/2010  . BRONCHITIS, ACUTE 12/15/2009  . WEIGHT LOSS 12/15/2009  . LOW BACK PAIN 10/25/2009  . HYPERGLYCEMIA 10/25/2009  . Vitamin D deficiency 02/24/2009  . VERTIGO 04/09/2008  . KNEE PAIN 03/30/2008  . ALLERGIC RHINITIS 02/13/2008  . Pain in joint, shoulder region 02/13/2008  . Anxiety state 09/30/2007  . Situational depression 09/30/2007  . EAR PAIN 09/30/2007  . Essential hypertension 09/30/2007    Current Outpatient Medications  Medication Sig Dispense Refill  . amLODipine (NORVASC) 10 MG tablet Take 1 tablet (10 mg total) by mouth daily. 90 tablet 3  . cholecalciferol (VITAMIN D) 1000 units tablet Take 2 tablets (2,000 Units total) by mouth daily. 100 tablet 11  . Cyanocobalamin (VITAMIN B-12) 1000 MCG SUBL Place 1 tablet (1,000 mcg total) under the tongue daily. 100 tablet 3  . fish oil-omega-3 fatty acids 1000 MG capsule Take 1 g by mouth daily.      [name] LORazepam (ATIVAN) 1 MG tablet Take 1 tablet (1 mg total) by mouth 2 (two) times daily as needed for anxiety. Patient  needs office visit before refills will be given 60 tablet 3  . losartan (COZAAR) 100 MG tablet Take 1 tablet (100 mg total) by mouth daily. 90 tablet 3  . triamterene-hydrochlorothiazide (MAXZIDE-25) 37.5-25 MG tablet Take 1 tablet by mouth daily. 90 tablet 3  . azithromycin (ZITHROMAX) 250 MG tablet 2 tabs po qd x 1 day; 1 tablet per day x 4 days; 6 tablet 0  . HYDROcodone-homatropine (HYCODAN) 5-1.5 MG/5ML syrup Take 5 mLs by mouth every 8 (eight) hours as needed for cough. 120 mL 0   Current Facility-Administered Medications  Medication Dose Route Frequency Provider Last Rate Last Dose  . methylPREDNISolone acetate (DEPO-MEDROL) injection 40 mg  40 mg Intra-articular Once Plotnikov, Aleksei V, MD      . methylPREDNISolone acetate (DEPO-MEDROL) injection 80 mg  80 mg Intra-articular Once Plotnikov, Evie Lacks, MD        Allergies: Citalopram; Mirtazapine; and Quinapril hcl  Past Medical History:  Diagnosis Date  . Allergic rhinitis   . Anemia   . Anxiety 2010  . Depression   . DJD (degenerative joint disease)    Left knee  . Heart murmur   . Hemorrhoid   . HTN (hypertension)   . Hyperlipidemia   . LBP (low back pain)   . MVP (mitral valve prolapse)   . PONV (postoperative nausea and vomiting)   . Symptomatic PVCs   . Urticaria    recurrent    Past Surgical History:  Procedure Laterality Date  . ABDOMINAL HYSTERECTOMY  1984  .  HEMORRHOID SURGERY    . KNEE ARTHROSCOPY WITH MEDIAL MENISECTOMY Right 05/21/2014   Procedure: RIGHT KNEE ARTHROSCOPY WITH DEBRIDEMENT/SHAVING, MEDIAL MENISECTOMY;  Surgeon: Renette Butters, MD;  Location: [facility];  Service: Orthopedics;  Laterality: Right;  . KNEE SURGERY  03/2007   Left Knee Arthr- Dr Onnie Graham  . ROTATOR CUFF REPAIR  2004   Left   . TONSILLECTOMY    . [place]      Family History  Problem Relation Age of Onset  . Diabetes Other   . Hypertension Other   . Breast cancer Other   . Cancer Other        breast   .  Hypertension Mother   . Colon cancer Neg Hx   . Esophageal cancer Neg Hx   . Stomach cancer Neg Hx   . Rectal cancer Neg Hx     Social History   Tobacco Use  . Smoking status: Never Smoker  . Smokeless tobacco: Never Used  Substance Use Topics  . Alcohol use: Yes    Comment: social    Subjective:  1 week history of cough/ congestion; + productive cough, green mucus;  Feels that congestion has moved into her chest; is prone to bronchitis; notes that symptoms are more problematic at night- no relief with Nyquil, Coricidin; + low grade fevers;    Objective:  Vitals:   01/06/19 0906  BP: 128/82  Pulse: (!) 52  Temp: 98.4 F (36.9 C)  TempSrc: Oral  SpO2: 98%  Weight: 175 lb 1.3 oz (79.4 kg)  Height: 5' 3.5" (1.613 m)    General: Well developed, well nourished, in no acute distress  Skin : Warm and dry.  Head: Normocephalic and atraumatic  Eyes: Sclera and conjunctiva clear; pupils round and reactive to light; extraocular movements intact  Ears: External normal; canals clear; tympanic membranes normal  Oropharynx: Pink, supple. No suspicious lesions  Neck: Supple without thyromegaly, adenopathy  Lungs: Respirations unlabored; clear to auscultation bilaterally without wheeze, rales, rhonchi  CVS exam: normal rate and regular rhythm.  Neurologic: Alert and oriented; speech intact; face symmetrical; moves all extremities well; CNII-XII intact without focal deficit   Assessment:  1. Acute bronchitis, unspecified organism   2. Acute bronchospasm     Plan:  Rx for Z-pak #1 take as directed, sample of BREO 100- use as directed; Rx for Hycodan 1 tsp po q 6-8 hours; increase fluids, rest and follow-up worse, no better.   No follow-ups on file.  No orders of the defined types were placed in this encounter.   Requested Prescriptions   Signed Prescriptions Disp Refills  . azithromycin (ZITHROMAX) 250 MG tablet 6 tablet 0    Sig: 2 tabs po qd x 1 day; 1 tablet per day x 4 days;   . HYDROcodone-homatropine (HYCODAN) 5-1.5 MG/5ML syrup 120 mL 0    Sig: Take 5 mLs by mouth every 8 (eight) hours as needed for cough.

## 2019-01-06 NOTE — Telephone Encounter (Signed)
Pt was given sample at [facility] today and pt called back to see how often she is to use the BREO. Advised pt that frequency of BREO is once daily, taken at the same time each day. Advised pt to rinse mouth out after each use.  Sending note to office to verify with provider to make sure advice is correct. Please have Jodi Mourning FNP review.  Reason for Disposition . Caller has URGENT medication question about med that PCP prescribed and triager unable to answer question  Answer Assessment - Initial Assessment Questions 1. SYMPTOMS: "Do you have any symptoms?"     n/a 2. SEVERITY: If symptoms are present, ask "Are they mild, moderate or severe?"     n/a  Protocols used: MEDICATION QUESTION CALL-A-AH

## 2019-01-09 ENCOUNTER — Other Ambulatory Visit: Payer: Self-pay | Admitting: Internal Medicine

## 2019-01-09 NOTE — Telephone Encounter (Signed)
Pt called in to check the status of this med.  I did let her know what we have tried to send it over.  And it will be resent   Best number [phone]

## 2019-01-09 NOTE — Addendum Note (Signed)
Addended by: Karren Cobble on: 01/09/2019 10:40 AM   Modules accepted: Orders

## 2019-01-11 MED ORDER — LORAZEPAM 1 MG PO TABS: ORAL_TABLET | ORAL | 3 refills | Status: DC

## 2019-01-20 ENCOUNTER — Other Ambulatory Visit: Payer: Self-pay | Admitting: Family

## 2019-01-20 MED ORDER — BENZONATATE 100 MG PO CAPS: 100.0000 mg | ORAL_CAPSULE | Freq: Three times a day (TID) | ORAL | 0 refills | Status: DC | PRN

## 2019-01-27 ENCOUNTER — Ambulatory Visit (INDEPENDENT_AMBULATORY_CARE_PROVIDER_SITE_OTHER)
Admission: RE | Admit: 2019-01-27 | Discharge: 2019-01-27 | Disposition: A | Discharge status: Home / Self Care | Payer: Medicare Other | Source: Ambulatory Visit | Attending: Internal Medicine | Admitting: Internal Medicine

## 2019-01-27 ENCOUNTER — Other Ambulatory Visit: Payer: Self-pay

## 2019-01-27 ENCOUNTER — Encounter: Payer: Self-pay | Admitting: Internal Medicine

## 2019-01-27 ENCOUNTER — Ambulatory Visit (INDEPENDENT_AMBULATORY_CARE_PROVIDER_SITE_OTHER): Payer: Medicare Other | Admitting: Internal Medicine

## 2019-01-27 DIAGNOSIS — F4321 Adjustment disorder with depressed mood: Secondary | ICD-10-CM

## 2019-01-27 DIAGNOSIS — J209 Acute bronchitis, unspecified: Secondary | ICD-10-CM

## 2019-01-27 DIAGNOSIS — I10 Essential (primary) hypertension: Secondary | ICD-10-CM | POA: Diagnosis not present

## 2019-01-27 MED ORDER — HYDROCODONE-HOMATROPINE 5-1.5 MG/5ML PO SYRP: 5.0000 mL | ORAL_SOLUTION | Freq: Three times a day (TID) | ORAL | 0 refills | Status: DC | PRN

## 2019-01-27 NOTE — Progress Notes (Signed)
Subjective:  Patient ID: Jane Mooney, female    DOB: 24-May-1951  Age: 68 y.o. MRN: [id number]  CC: No chief complaint on file.   HPI Jane Mooney presents for cough since Feb 19th - URI sx's, fever. Saw Mickel Baas on 2/25 - Zpac, cough Rx, Tessalon, MDI. Cough is worse at night - not productive...  Outpatient Medications Prior to Visit  Medication Sig Dispense Refill   amLODipine (NORVASC) 10 MG tablet Take 1 tablet (10 mg total) by mouth daily. 90 tablet 3   benzonatate (TESSALON) 100 MG capsule Take 1 capsule (100 mg total) by mouth 3 (three) times daily as needed. 20 capsule 0   cholecalciferol (VITAMIN D) 1000 units tablet Take 2 tablets (2,000 Units total) by mouth daily. 100 tablet 11   Cyanocobalamin (VITAMIN B-12) 1000 MCG SUBL Place 1 tablet (1,000 mcg total) under the tongue daily. 100 tablet 3   fish oil-omega-3 fatty acids 1000 MG capsule Take 1 g by mouth daily.       LORazepam (ATIVAN) 1 MG tablet TAKE 1 TABLET BY MOUTH TWICE A DAY AS NEEDED FOR ANXIETY. 60 tablet 3   LORazepam (ATIVAN) 1 MG tablet TAKE 1 TABLET BY MOUTH TWICE A DAY AS NEEDED FOR ANXIETY. PLEASE SCHEDULE OV FOR REFILLS 60 tablet 3   losartan (COZAAR) 100 MG tablet Take 1 tablet (100 mg total) by mouth daily. 90 tablet 3   triamterene-hydrochlorothiazide (MAXZIDE-25) 37.5-25 MG tablet Take 1 tablet by mouth daily. 90 tablet 3   azithromycin (ZITHROMAX) 250 MG tablet 2 tabs po qd x 1 day; 1 tablet per day x 4 days; 6 tablet 0   HYDROcodone-homatropine (HYCODAN) 5-1.5 MG/5ML syrup Take 5 mLs by mouth every 8 (eight) hours as needed for cough. 120 mL 0   Facility-Administered Medications Prior to Visit  Medication Dose Route Frequency Provider Last Rate Last Dose   methylPREDNISolone acetate (DEPO-MEDROL) injection 40 mg  40 mg Intra-articular Once Antoinette Borgwardt, Evie Lacks, MD       methylPREDNISolone acetate (DEPO-MEDROL) injection 80 mg  80 mg Intra-articular Once Madoline Bhatt, Evie Lacks, MD         ROS: Review of Systems  Constitutional: Negative for activity change, appetite change, chills, fatigue and unexpected weight change.  HENT: Negative for congestion, mouth sores and sinus pressure.   Eyes: Negative for visual disturbance.  Respiratory: Positive for cough. Negative for chest tightness and shortness of breath.   Gastrointestinal: Negative for abdominal pain and nausea.  Genitourinary: Negative for difficulty urinating, frequency and vaginal pain.  Musculoskeletal: Negative for back pain and gait problem.  Skin: Negative for pallor and rash.  Neurological: Negative for dizziness, tremors, weakness, numbness and headaches.  Psychiatric/Behavioral: Negative for confusion and sleep disturbance.    Objective:  BP 132/82 (BP Location: Left Arm, Patient Position: Sitting, Cuff Size: Large)    Pulse (!) 57    Temp 98.6 F (37 C) (Oral)    Ht 5' 3.5" (1.613 m)    Wt 172 lb (78 kg)    SpO2 99%    BMI 29.99 kg/m   BP Readings from Last 3 Encounters:  01/27/19 132/82  01/06/19 128/82  09/19/18 122/78    Wt Readings from Last 3 Encounters:  01/27/19 172 lb (78 kg)  01/06/19 175 lb 1.3 oz (79.4 kg)  09/19/18 174 lb (78.9 kg)    Physical Exam Constitutional:      General: She is not in acute distress.    Appearance: She is well-developed.  HENT:     Head: Normocephalic.     Right Ear: External ear normal.     Left Ear: External ear normal.     Nose: Nose normal.  Eyes:     General:        Right eye: No discharge.        Left eye: No discharge.     Conjunctiva/sclera: Conjunctivae normal.     Pupils: Pupils are equal, round, and reactive to light.  Neck:     Musculoskeletal: Normal range of motion and neck supple.     Thyroid: No thyromegaly.     Vascular: No JVD.     Trachea: No tracheal deviation.  Cardiovascular:     Rate and Rhythm: Normal rate and regular rhythm.     Heart sounds: Normal heart sounds.  Pulmonary:     Effort: No respiratory distress.      Breath sounds: No stridor. No wheezing.  Abdominal:     General: Bowel sounds are normal. There is no distension.     Palpations: Abdomen is soft. There is no mass.     Tenderness: There is no abdominal tenderness. There is no guarding or rebound.  Musculoskeletal:        General: No tenderness.  Lymphadenopathy:     Cervical: No cervical adenopathy.  Skin:    Findings: No erythema or rash.  Neurological:     Cranial Nerves: No cranial nerve deficit.     Motor: No abnormal muscle tone.     Coordination: Coordination normal.     Deep Tendon Reflexes: Reflexes normal.  Psychiatric:        Behavior: Behavior normal.        Thought Content: Thought content normal.        Judgment: Judgment normal.   eryth throat  Lab Results  Component Value Date   WBC 4.8 09/19/2018   HGB 12.2 09/19/2018   HCT 36.8 09/19/2018   PLT 198.0 09/19/2018   GLUCOSE 98 09/19/2018   CHOL 206 (H) 09/19/2018   TRIG 117.0 09/19/2018   HDL 50.70 09/19/2018   LDLDIRECT 153.6 07/06/2013   LDLCALC 132 (H) 09/19/2018   ALT 9 09/19/2018   AST 14 09/19/2018   NA 141 09/19/2018   K 4.2 09/19/2018   CL 104 09/19/2018   CREATININE 1.07 09/19/2018   BUN 14 09/19/2018   CO2 31 09/19/2018   TSH 2.22 09/19/2018   HGBA1C 5.8 07/28/2014    Dg Bone Density (dxa)  Result Date: 10/28/2016 Date of study: 10/23/16 Exam: DUAL X-RAY ABSORPTIOMETRY (DXA) FOR BONE MINERAL DENSITY (BMD) Instrument: [employer] Provider: PCP Indication: follow up for low BMD Comparison: none (please note that it is not possible to compare data from different instruments) Clinical data: Pt is a 68 y.o. female without previous history of fracture. On calcium and vitamin D. Results:  Lumbar spine (L1-L4) Femoral neck (FN) 33% distal radius T-score -1.8 RFN:-0.4 LFN:-0.5 n/a Change in BMD from previous DXA test (%) n/a n/a n/a (*) statistically significant Assessment: the BMD is low according to the [facility] classification for  osteoporosis (see below). Fracture risk: moderate FRAX score: 10 year major osteoporotic risk: 3.3%. 10 year hip fracture risk: 0.2%. These are under the thresholds for treatment of 20% and 3%, respectively. Comments: the technical quality of the study is good. Evaluation for secondary causes should be considered if clinically indicated. Recommend optimizing calcium (1200 mg/day) and vitamin D (800 IU/day) intake. Followup: Repeat BMD is appropriate  after 2 years or after 1-2 years if starting treatment. WHO criteria for diagnosis of osteoporosis in postmenopausal women and in men 79 y/o or older: - normal: T-score -1.0 to + 1.0 - osteopenia/low bone density: T-score between -2.5 and -1.0 - osteoporosis: T-score below -2.5 - severe osteoporosis: T-score below -2.5 with history of fragility fracture Note: although not part of the WHO classification, the presence of a fragility fracture, regardless of the T-score, should be considered diagnostic of osteoporosis, provided other causes for the fracture have been excluded. Treatment: The National Osteoporosis Foundation recommends that treatment be considered in postmenopausal women and men age 51 or older with: 1. Hip or vertebral (clinical or morphometric) fracture 2. T-score of - 2.5 or lower at the spine or hip 3. 10-year fracture probability by FRAX of at least 20% for a major osteoporotic fracture and 3% for a hip fracture Loura Pardon MD    Assessment & Plan:   There are no diagnoses linked to this encounter.   No orders of the defined types were placed in this encounter.    Follow-up: No follow-ups on file.  [name], MD

## 2019-01-27 NOTE — Assessment & Plan Note (Signed)
Lorazepam prn 

## 2019-01-27 NOTE — Assessment & Plan Note (Signed)
CXR Hycodan syr

## 2019-01-27 NOTE — Assessment & Plan Note (Signed)
Amlodipine, Losartan, Triamt-HCTZ

## 2019-04-21 DIAGNOSIS — H04123 Dry eye syndrome of bilateral lacrimal glands: Secondary | ICD-10-CM | POA: Diagnosis not present

## 2019-04-21 DIAGNOSIS — H25813 Combined forms of age-related cataract, bilateral: Secondary | ICD-10-CM | POA: Diagnosis not present

## 2019-04-21 DIAGNOSIS — Z0100 Encounter for examination of eyes and vision without abnormal findings: Secondary | ICD-10-CM | POA: Diagnosis not present

## 2019-06-04 ENCOUNTER — Other Ambulatory Visit: Payer: Self-pay | Admitting: Internal Medicine

## 2019-06-04 MED ORDER — TRIAMCINOLONE ACETONIDE 0.1 % EX OINT: 1.0000 "application " | TOPICAL_OINTMENT | Freq: Four times a day (QID) | CUTANEOUS | 1 refills | Status: DC

## 2019-06-04 MED ORDER — POLYETHYLENE GLYCOL 3350 17 GM/SCOOP PO POWD: 17.0000 g | Freq: Two times a day (BID) | ORAL | 1 refills | Status: DC | PRN

## 2019-06-17 ENCOUNTER — Other Ambulatory Visit: Payer: Self-pay | Admitting: Internal Medicine

## 2019-06-17 MED ORDER — TELMISARTAN 80 MG PO TABS: 80.0000 mg | ORAL_TABLET | Freq: Every day | ORAL | 3 refills | Status: DC

## 2019-06-19 ENCOUNTER — Other Ambulatory Visit: Payer: Self-pay | Admitting: Internal Medicine

## 2019-07-14 ENCOUNTER — Other Ambulatory Visit: Payer: Self-pay | Admitting: Internal Medicine

## 2019-07-18 ENCOUNTER — Other Ambulatory Visit: Payer: Self-pay | Admitting: Internal Medicine

## 2019-07-21 NOTE — Telephone Encounter (Signed)
[place] Controlled Database Checked Last filled: 06/04/19 # 60 LOV w/you: 01/27/19 Next appt w/you: 09/22/19

## 2019-07-24 NOTE — Telephone Encounter (Signed)
Pt calling to check status. Please advise  °

## 2019-07-26 ENCOUNTER — Other Ambulatory Visit: Payer: Self-pay | Admitting: Internal Medicine

## 2019-07-26 MED ORDER — LORAZEPAM 1 MG PO TABS: ORAL_TABLET | ORAL | 3 refills | Status: DC

## 2019-07-29 ENCOUNTER — Ambulatory Visit (INDEPENDENT_AMBULATORY_CARE_PROVIDER_SITE_OTHER): Payer: Medicare HMO

## 2019-07-29 ENCOUNTER — Other Ambulatory Visit: Payer: Self-pay

## 2019-07-29 DIAGNOSIS — Z23 Encounter for immunization: Secondary | ICD-10-CM

## 2019-09-22 ENCOUNTER — Ambulatory Visit (INDEPENDENT_AMBULATORY_CARE_PROVIDER_SITE_OTHER): Payer: Medicare HMO | Admitting: Internal Medicine

## 2019-09-22 ENCOUNTER — Other Ambulatory Visit: Payer: Self-pay

## 2019-09-22 ENCOUNTER — Encounter: Payer: Self-pay | Admitting: Internal Medicine

## 2019-09-22 VITALS — BP 126/64 | HR 69 | Temp 98.2°F | Ht 63.5 in | Wt 183.0 lb

## 2019-09-22 DIAGNOSIS — E785 Hyperlipidemia, unspecified: Secondary | ICD-10-CM | POA: Diagnosis not present

## 2019-09-22 DIAGNOSIS — Z0000 Encounter for general adult medical examination without abnormal findings: Secondary | ICD-10-CM

## 2019-09-22 DIAGNOSIS — E538 Deficiency of other specified B group vitamins: Secondary | ICD-10-CM

## 2019-09-22 DIAGNOSIS — M25561 Pain in right knee: Secondary | ICD-10-CM | POA: Diagnosis not present

## 2019-09-22 DIAGNOSIS — E559 Vitamin D deficiency, unspecified: Secondary | ICD-10-CM

## 2019-09-22 DIAGNOSIS — M170 Bilateral primary osteoarthritis of knee: Secondary | ICD-10-CM | POA: Diagnosis not present

## 2019-09-22 MED ORDER — MELOXICAM 7.5 MG PO TABS: 7.5000 mg | ORAL_TABLET | Freq: Every day | ORAL | 3 refills | Status: DC | PRN

## 2019-09-22 NOTE — Progress Notes (Signed)
Subjective:  Patient ID: Jane Mooney, female    DOB: 1951-06-15  Age: 68 y.o. MRN: [id number]  CC: No chief complaint on file.   HPI Jane Mooney presents for a well exam  Outpatient Medications Prior to Visit  Medication Sig Dispense Refill  . amLODipine (NORVASC) 10 MG tablet TAKE 1 TABLET BY MOUTH EVERY DAY 90 tablet 3  . benzonatate (TESSALON) 100 MG capsule Take 1 capsule (100 mg total) by mouth 3 (three) times daily as needed. 20 capsule 0  . cholecalciferol (VITAMIN D) 1000 units tablet Take 2 tablets (2,000 Units total) by mouth daily. 100 tablet 11  . Cyanocobalamin (VITAMIN B-12) 1000 MCG SUBL Place 1 tablet (1,000 mcg total) under the tongue daily. 100 tablet 3  . fish oil-omega-3 fatty acids 1000 MG capsule Take 1 g by mouth daily.      [name] HYDROcodone-homatropine (HYCODAN) 5-1.5 MG/5ML syrup Take 5 mLs by mouth every 8 (eight) hours as needed for cough. 240 mL 0  . LORazepam (ATIVAN) 1 MG tablet TAKE 1 TABLET BY MOUTH TWICE A DAY AS NEEDED FOR ANXIETY. 60 tablet 3  . polyethylene glycol powder (GLYCOLAX/MIRALAX) 17 GM/SCOOP powder Take 17 g by mouth 2 (two) times daily as needed. 578 g 1  . telmisartan (MICARDIS) 80 MG tablet Take 1 tablet (80 mg total) by mouth daily. 90 tablet 3  . triamcinolone ointment (KENALOG) 0.1 % Apply 1 application topically 4 (four) times daily. 80 g 1  . triamterene-hydrochlorothiazide (MAXZIDE-25) 37.5-25 MG tablet TAKE 1 TABLET BY MOUTH EVERY DAY 90 tablet 3  . losartan (COZAAR) 100 MG tablet Take 1 tablet (100 mg total) by mouth daily. 90 tablet 1   Facility-Administered Medications Prior to Visit  Medication Dose Route Frequency Provider Last Rate Last Dose  . methylPREDNISolone acetate (DEPO-MEDROL) injection 40 mg  40 mg Intra-articular Once Plotnikov, Aleksei V, MD      . methylPREDNISolone acetate (DEPO-MEDROL) injection 80 mg  80 mg Intra-articular Once Plotnikov, Evie Lacks, MD        ROS: Review of Systems  Constitutional:  Positive for fatigue. Negative for activity change, appetite change, chills and unexpected weight change.  HENT: Negative for congestion, mouth sores and sinus pressure.   Eyes: Negative for visual disturbance.  Respiratory: Negative for cough and chest tightness.   Gastrointestinal: Negative for abdominal pain and nausea.  Genitourinary: Negative for difficulty urinating, frequency and vaginal pain.  Musculoskeletal: Positive for arthralgias and gait problem. Negative for back pain.  Skin: Negative for pallor and rash.  Neurological: Negative for dizziness, tremors, weakness, numbness and headaches.  Psychiatric/Behavioral: Positive for sleep disturbance. Negative for confusion and suicidal ideas. The patient is not nervous/anxious.     Objective:  BP 126/64 (BP Location: Left Arm, Patient Position: Sitting, Cuff Size: Large)   Pulse 69   Temp 98.2 F (36.8 C) (Oral)   Ht 5' 3.5" (1.613 m)   Wt 183 lb (83 kg)   SpO2 98%   BMI 31.91 kg/m   BP Readings from Last 3 Encounters:  09/22/19 126/64  01/27/19 132/82  01/06/19 128/82    Wt Readings from Last 3 Encounters:  09/22/19 183 lb (83 kg)  01/27/19 172 lb (78 kg)  01/06/19 175 lb 1.3 oz (79.4 kg)    Physical Exam Constitutional:      General: She is not in acute distress.    Appearance: She is well-developed. She is obese.  HENT:     Head: Normocephalic.  Right Ear: External ear normal.     Left Ear: External ear normal.     Nose: Nose normal.  Eyes:     General:        Right eye: No discharge.        Left eye: No discharge.     Conjunctiva/sclera: Conjunctivae normal.     Pupils: Pupils are equal, round, and reactive to light.  Neck:     Musculoskeletal: Normal range of motion and neck supple.     Thyroid: No thyromegaly.     Vascular: No JVD.     Trachea: No tracheal deviation.  Cardiovascular:     Rate and Rhythm: Normal rate and regular rhythm.     Heart sounds: Normal heart sounds.  Pulmonary:      Effort: No respiratory distress.     Breath sounds: No stridor. No wheezing.  Abdominal:     General: Bowel sounds are normal. There is no distension.     Palpations: Abdomen is soft. There is no mass.     Tenderness: There is no abdominal tenderness. There is no guarding or rebound.  Musculoskeletal:        General: Tenderness present.  Lymphadenopathy:     Cervical: No cervical adenopathy.  Skin:    Findings: No erythema or rash.  Neurological:     Mental Status: She is oriented to person, place, and time.     Cranial Nerves: No cranial nerve deficit.     Motor: No abnormal muscle tone.     Coordination: Coordination normal.     Gait: Gait abnormal.     Deep Tendon Reflexes: Reflexes normal.  Psychiatric:        Behavior: Behavior normal.        Thought Content: Thought content normal.        Judgment: Judgment normal.   B knees w/pain  Lab Results  Component Value Date   WBC 4.8 09/19/2018   HGB 12.2 09/19/2018   HCT 36.8 09/19/2018   PLT 198.0 09/19/2018   GLUCOSE 98 09/19/2018   CHOL 206 (H) 09/19/2018   TRIG 117.0 09/19/2018   HDL 50.70 09/19/2018   LDLDIRECT 153.6 07/06/2013   LDLCALC 132 (H) 09/19/2018   ALT 9 09/19/2018   AST 14 09/19/2018   NA 141 09/19/2018   K 4.2 09/19/2018   CL 104 09/19/2018   CREATININE 1.07 09/19/2018   BUN 14 09/19/2018   CO2 31 09/19/2018   TSH 2.22 09/19/2018   HGBA1C 5.8 07/28/2014    Dg Chest 2 View  Result Date: 01/27/2019 CLINICAL DATA:  Dry cough, x 1 mo, pr dx with bronchitis, pt was on antibiotic but no relief, HTN, EXAM: CHEST - 2 VIEW COMPARISON:  07/19/2015 FINDINGS: The heart size and mediastinal contours are within normal limits. Both lungs are clear. No pleural effusion or pneumothorax. The visualized skeletal structures are unremarkable. IMPRESSION: No active cardiopulmonary disease. Electronically Signed   By: Lajean Manes M.D.   On: 01/27/2019 19:53    Assessment & Plan:   There are no diagnoses linked to  this encounter.   No orders of the defined types were placed in this encounter.    Follow-up: No follow-ups on file.  [name], MD

## 2019-09-22 NOTE — Assessment & Plan Note (Signed)
On Vit D 

## 2019-09-22 NOTE — Assessment & Plan Note (Signed)
On B12 

## 2019-09-22 NOTE — Assessment & Plan Note (Signed)
We discussed age appropriate health related issues, including available/recomended screening tests and vaccinations. We discussed a need for adhering to healthy diet and exercise. Labs were ordered to be later reviewed . All questions were answered. CT cor ca test offered 11/20 Colon due 2025 Shingrix rx

## 2019-09-22 NOTE — Assessment & Plan Note (Signed)
Mobic   Potential benefits of a long term NSAIDs use as well as potential risks (i.e. addiction risk, apnea etc) and complications (i.e. Somnolence, constipation and others) were explained to the patient and were aknowledged.

## 2019-09-22 NOTE — Patient Instructions (Addendum)
Cardiac CT calcium scoring test $150 Tel # is [phone]   Computed tomography, more commonly known as a CT or CAT scan, is a diagnostic medical imaging test. Like traditional x-rays, it produces multiple images or pictures of the inside of the body. The cross-sectional images generated during a CT scan can be reformatted in multiple planes. They can even generate three-dimensional images. These images can be viewed on a computer monitor, printed on film or by a 3D printer, or transferred to a CD or DVD. CT images of internal organs, bones, soft tissue and blood vessels provide greater detail than traditional x-rays, particularly of soft tissues and blood vessels. A cardiac CT scan for coronary calcium is a non-invasive way of obtaining information about the presence, location and extent of calcified plaque in the coronary arteries-the vessels that supply oxygen-containing blood to the heart muscle. Calcified plaque results when there is a build-up of fat and other substances under the inner layer of the artery. This material can calcify which signals the presence of atherosclerosis, a disease of the vessel wall, also called coronary artery disease (CAD). People with this disease have an increased risk for heart attacks. In addition, over time, progression of plaque build up (CAD) can narrow the arteries or even close off blood flow to the heart. The result may be chest pain, sometimes called "angina," or a heart attack. Because calcium is a marker of CAD, the amount of calcium detected on a cardiac CT scan is a helpful prognostic tool. The findings on cardiac CT are expressed as a calcium score. Another name for this test is coronary artery calcium scoring.  What are some common uses of the procedure? The goal of cardiac CT scan for calcium scoring is to determine if CAD is present and to what extent, even if there are no symptoms. It is a screening study that may be recommended by a physician for  patients with risk factors for CAD but no clinical symptoms. The major risk factors for CAD are: . high blood cholesterol levels  . family history of heart attacks  . diabetes  . high blood pressure  . cigarette smoking  . overweight or obese  . physical inactivity   A negative cardiac CT scan for calcium scoring shows no calcification within the coronary arteries. This suggests that CAD is absent or so minimal it cannot be seen by this technique. The chance of having a heart attack over the next two to five years is very low under these circumstances. A positive test means that CAD is present, regardless of whether or not the patient is experiencing any symptoms. The amount of calcification-expressed as the calcium score-may help to predict the likelihood of a myocardial infarction (heart attack) in the coming years and helps your medical doctor or cardiologist decide whether the patient may need to take preventive medicine or undertake other measures such as diet and exercise to lower the risk for heart attack. The extent of CAD is graded according to your calcium score:  Calcium Score  Presence of CAD (coronary artery disease)  0 No evidence of CAD   1-10 Minimal evidence of CAD  11-100 Mild evidence of CAD  101-400 Moderate evidence of CAD  Over 400 Extensive evidence of CAD      These suggestions will probably help you to improve your metabolism if you are not overweight and to lose weight if you are overweight: 1.  Reduce your consumption of sugars and starches.  Eliminate high  fructose corn syrup from your diet.  Reduce your consumption of processed foods.  For desserts try to have seasonal fruits, berries, nuts, cheeses or dark chocolate with more than 70% cacao. 2.  Do not snack 3.  You do not have to eat breakfast.  If you choose to have breakfast-eat plain greek yogurt, eggs, oatmeal (without sugar) 4.  Drink water, freshly brewed unsweetened tea (green, black or herbal) or  coffee.  Do not drink sodas including diet sodas , juices, beverages sweetened with artificial sweeteners. 5.  Reduce your consumption of refined grains. 6.  Avoid protein drinks such as Optifast, Slim fast etc. Eat chicken, fish, meat, dairy and beans for your sources of protein 7.  Natural unprocessed fats like cold pressed virgin olive oil, butter, coconut oil are good for you.  Eat avocados 8.  Increase your consumption of fiber.  Fruits, berries, vegetables, whole grains, flaxseeds, Chia seeds, beans, popcorn, nuts, oatmeal are good sources of fiber 9.  Use vinegar in your diet, i.e. apple cider vinegar, red wine or balsamic vinegar 10.  You can try fasting.  For example you can skip breakfast and lunch every other day (24-hour fast) 11.  Stress reduction, good night sleep, relaxation, meditation, yoga and other physical activity is likely to help you to maintain low weight too. 12.  If you drink alcohol, limit your alcohol intake to no more than 2 drinks a day.     Cabbage soup recipe that will not make you gain weight: Take 1 small head of cabbage, 1 average pack of celery, 4 green peppers, 4 onions, 2 cans diced tomatoes (they are not available without salt), salt and spices to taste.  Chop cabbage, celery, peppers and onions.  And tomatoes and 2-2.5 liters (2.5 quarts) of water so that it would just cover the vegetables.  Bring to boil.  Add spices and salt.  Turn heat to low/medium and simmer for 20-25 minutes.  Naturally, you can make a smaller batch and change some of the ingredients.   If you have medicare related insurance (such as traditional Medicare, [employer], [employer], or similar), Please make an appointment at the scheduling desk with Sharee Pimple, the [employer], for your Wellness visit in this office, which is a benefit with your insurance.

## 2019-09-22 NOTE — Assessment & Plan Note (Signed)
Ibuprofen prn pc S/p R knee meniscus surgery - Dr Percell Miller CBD oil

## 2019-09-22 NOTE — Assessment & Plan Note (Signed)
CT cor ca test offered 11/20

## 2019-09-23 ENCOUNTER — Other Ambulatory Visit (INDEPENDENT_AMBULATORY_CARE_PROVIDER_SITE_OTHER): Payer: Medicare HMO

## 2019-09-23 DIAGNOSIS — Z0000 Encounter for general adult medical examination without abnormal findings: Secondary | ICD-10-CM

## 2019-09-23 DIAGNOSIS — E785 Hyperlipidemia, unspecified: Secondary | ICD-10-CM

## 2019-09-23 LAB — URINALYSIS
Bilirubin Urine: NEGATIVE
Hgb urine dipstick: NEGATIVE
Ketones, ur: NEGATIVE
Leukocytes,Ua: NEGATIVE
Nitrite: NEGATIVE
Specific Gravity, Urine: 1.02 (ref 1.000–1.030)
Total Protein, Urine: NEGATIVE
Urine Glucose: NEGATIVE
Urobilinogen, UA: 0.2 (ref 0.0–1.0)
pH: 6 (ref 5.0–8.0)

## 2019-09-23 LAB — CBC WITH DIFFERENTIAL/PLATELET
Basophils Absolute: 0.1 10*3/uL (ref 0.0–0.1)
Basophils Relative: 1.1 % (ref 0.0–3.0)
Eosinophils Absolute: 0.3 10*3/uL (ref 0.0–0.7)
Eosinophils Relative: 4.4 % (ref 0.0–5.0)
HCT: 35.2 % — ABNORMAL LOW (ref 36.0–46.0)
Hemoglobin: 11.6 g/dL — ABNORMAL LOW (ref 12.0–15.0)
Lymphocytes Relative: 35.5 % (ref 12.0–46.0)
Lymphs Abs: 2.1 10*3/uL (ref 0.7–4.0)
MCHC: 33 g/dL (ref 30.0–36.0)
MCV: 80.3 fl (ref 78.0–100.0)
Monocytes Absolute: 0.8 10*3/uL (ref 0.1–1.0)
Monocytes Relative: 13.4 % — ABNORMAL HIGH (ref 3.0–12.0)
Neutro Abs: 2.6 10*3/uL (ref 1.4–7.7)
Neutrophils Relative %: 45.6 % (ref 43.0–77.0)
Platelets: 230 10*3/uL (ref 150.0–400.0)
RBC: 4.38 Mil/uL (ref 3.87–5.11)
RDW: 14.3 % (ref 11.5–15.5)
WBC: 5.8 10*3/uL (ref 4.0–10.5)

## 2019-09-23 LAB — BASIC METABOLIC PANEL
BUN: 26 mg/dL — ABNORMAL HIGH (ref 6–23)
CO2: 27 mEq/L (ref 19–32)
Calcium: 9.5 mg/dL (ref 8.4–10.5)
Chloride: 104 mEq/L (ref 96–112)
Creatinine, Ser: 1.67 mg/dL — ABNORMAL HIGH (ref 0.40–1.20)
GFR: 36.68 mL/min — ABNORMAL LOW (ref >60.00)
Glucose, Bld: 111 mg/dL — ABNORMAL HIGH (ref 70–99)
Potassium: 3.7 mEq/L (ref 3.5–5.1)
Sodium: 140 mEq/L (ref 135–145)

## 2019-09-23 LAB — HEPATIC FUNCTION PANEL
ALT: 9 U/L (ref 0–35)
AST: 15 U/L (ref 0–37)
Albumin: 4.3 g/dL (ref 3.5–5.2)
Alkaline Phosphatase: 81 U/L (ref 39–117)
Bilirubin, Direct: 0.1 mg/dL (ref 0.0–0.3)
Total Bilirubin: 0.5 mg/dL (ref 0.2–1.2)
Total Protein: 7.7 g/dL (ref 6.0–8.3)

## 2019-09-23 LAB — LIPID PANEL
Cholesterol: 243 mg/dL — ABNORMAL HIGH (ref 0–200)
HDL: 40.7 mg/dL (ref >39.00)
LDL Cholesterol: 169 mg/dL — ABNORMAL HIGH (ref 0–99)
NonHDL: 202.56
Total CHOL/HDL Ratio: 6
Triglycerides: 167 mg/dL — ABNORMAL HIGH (ref 0.0–149.0)
VLDL: 33.4 mg/dL (ref 0.0–40.0)

## 2019-09-23 LAB — TSH: TSH: 1.71 u[IU]/mL (ref 0.35–4.50)

## 2019-09-26 ENCOUNTER — Other Ambulatory Visit: Payer: Self-pay | Admitting: Internal Medicine

## 2019-09-26 ENCOUNTER — Encounter: Payer: Self-pay | Admitting: Internal Medicine

## 2019-09-26 DIAGNOSIS — N183 Chronic kidney disease, stage 3 unspecified: Secondary | ICD-10-CM

## 2019-10-07 ENCOUNTER — Ambulatory Visit
Admission: RE | Admit: 2019-10-07 | Discharge: 2019-10-07 | Disposition: A | Discharge status: Home / Self Care | Payer: Medicare HMO | Source: Ambulatory Visit | Attending: Internal Medicine | Admitting: Internal Medicine

## 2019-10-07 DIAGNOSIS — N183 Chronic kidney disease, stage 3 unspecified: Secondary | ICD-10-CM

## 2019-10-07 DIAGNOSIS — N281 Cyst of kidney, acquired: Secondary | ICD-10-CM | POA: Diagnosis not present

## 2019-10-07 DIAGNOSIS — N189 Chronic kidney disease, unspecified: Secondary | ICD-10-CM | POA: Diagnosis not present

## 2019-10-14 ENCOUNTER — Other Ambulatory Visit: Payer: Self-pay | Admitting: Internal Medicine

## 2019-11-05 ENCOUNTER — Ambulatory Visit: Payer: Self-pay | Admitting: *Deleted

## 2019-11-05 NOTE — Telephone Encounter (Signed)
Pt calling with questions regarding covid exposure. Answered to pts satisfaction. Advised to CB if any further questions arise.

## 2019-11-24 DIAGNOSIS — Z01419 Encounter for gynecological examination (general) (routine) without abnormal findings: Secondary | ICD-10-CM | POA: Diagnosis not present

## 2019-11-24 DIAGNOSIS — Z1231 Encounter for screening mammogram for malignant neoplasm of breast: Secondary | ICD-10-CM | POA: Diagnosis not present

## 2019-12-17 ENCOUNTER — Other Ambulatory Visit: Payer: Medicare HMO

## 2019-12-21 ENCOUNTER — Other Ambulatory Visit: Payer: Self-pay

## 2019-12-21 ENCOUNTER — Other Ambulatory Visit (INDEPENDENT_AMBULATORY_CARE_PROVIDER_SITE_OTHER): Payer: Medicare HMO

## 2019-12-21 DIAGNOSIS — N183 Chronic kidney disease, stage 3 unspecified: Secondary | ICD-10-CM

## 2019-12-21 LAB — BASIC METABOLIC PANEL WITH GFR
BUN: 18 mg/dL (ref 6–23)
CO2: 28 meq/L (ref 19–32)
Calcium: 9.5 mg/dL (ref 8.4–10.5)
Chloride: 104 meq/L (ref 96–112)
Creatinine, Ser: 1.08 mg/dL (ref 0.40–1.20)
GFR: 60.61 mL/min
Glucose, Bld: 105 mg/dL — ABNORMAL HIGH (ref 70–99)
Potassium: 4.1 meq/L (ref 3.5–5.1)
Sodium: 138 meq/L (ref 135–145)

## 2019-12-22 ENCOUNTER — Ambulatory Visit: Payer: Self-pay

## 2019-12-22 ENCOUNTER — Ambulatory Visit: Payer: Medicare HMO | Admitting: Family Medicine

## 2019-12-22 ENCOUNTER — Ambulatory Visit (INDEPENDENT_AMBULATORY_CARE_PROVIDER_SITE_OTHER): Payer: Medicare HMO

## 2019-12-22 ENCOUNTER — Encounter: Payer: Self-pay | Admitting: Internal Medicine

## 2019-12-22 ENCOUNTER — Other Ambulatory Visit: Payer: Self-pay

## 2019-12-22 ENCOUNTER — Ambulatory Visit (INDEPENDENT_AMBULATORY_CARE_PROVIDER_SITE_OTHER): Payer: Medicare HMO | Admitting: Internal Medicine

## 2019-12-22 ENCOUNTER — Encounter: Payer: Self-pay | Admitting: Family Medicine

## 2019-12-22 VITALS — BP 120/72 | HR 62 | Temp 98.7°F | Ht 63.5 in | Wt 183.0 lb

## 2019-12-22 DIAGNOSIS — R944 Abnormal results of kidney function studies: Secondary | ICD-10-CM | POA: Diagnosis not present

## 2019-12-22 DIAGNOSIS — G8929 Other chronic pain: Secondary | ICD-10-CM

## 2019-12-22 DIAGNOSIS — M1711 Unilateral primary osteoarthritis, right knee: Secondary | ICD-10-CM | POA: Diagnosis not present

## 2019-12-22 DIAGNOSIS — M25562 Pain in left knee: Secondary | ICD-10-CM | POA: Diagnosis not present

## 2019-12-22 DIAGNOSIS — M25561 Pain in right knee: Secondary | ICD-10-CM

## 2019-12-22 DIAGNOSIS — I10 Essential (primary) hypertension: Secondary | ICD-10-CM | POA: Diagnosis not present

## 2019-12-22 DIAGNOSIS — E559 Vitamin D deficiency, unspecified: Secondary | ICD-10-CM

## 2019-12-22 DIAGNOSIS — M1712 Unilateral primary osteoarthritis, left knee: Secondary | ICD-10-CM | POA: Diagnosis not present

## 2019-12-22 DIAGNOSIS — M170 Bilateral primary osteoarthritis of knee: Secondary | ICD-10-CM | POA: Diagnosis not present

## 2019-12-22 MED ORDER — TRIAMTERENE-HCTZ 37.5-25 MG PO TABS: 0.5000 | ORAL_TABLET | Freq: Every day | ORAL | 3 refills | Status: DC

## 2019-12-22 NOTE — Progress Notes (Signed)
Subjective:    CC: B knee pain  HPI: Pt is a 69 y/o female presenting w/ c/o B knee pain going on a long time worse with in the last year.  She denies any injury.  She has had a history of right knee arthroscopic surgery a few years ago for meniscus tear.  She denies any recent falls.  She notes that sometimes the pain interferes with her ability to walk normally or exercise normally.  Aggravating factors: sitting for a long time and then standing up  Treatments tried: CBD oil and gummies, Voltaren Gel and prescribed Mobic did not help  Pertinent review of Systems: No fevers or chills.  Relevant historical information: Hypertension   Objective:    Vitals:   12/22/19 0948  BP: 120/72  Pulse: 62  Temp: 98.7 F (37.1 C)  SpO2: 99%   General: Well Developed, well nourished, and in no acute distress.   MSK:  Right knee: Mild effusion well-appearing otherwise with no erythema. Range of motion 0-120 degrees with minimal crepitation. Mildly tender palpation medial and lateral joint line. Stable ligamentous exam. Negative Murray's test. Intact strength.  Left knee: Normal-appearing minimal effusion no erythema. Range of motion 0-120 degrees with crepitations. Mildly tender palpation medial and lateral joint line. Stable ligamentous exam. Negative Murray's test. Intact strength.  Lab and Radiology Results  Procedure: Real-time Ultrasound Guided Injection of left knee Device: Philips Affiniti 50G Images permanently stored and available for review in the ultrasound unit. Verbal informed consent obtained.  Discussed risks and benefits of procedure. Warned about infection bleeding damage to structures skin hypopigmentation and fat atrophy among others. Patient expresses understanding and agreement Time-out conducted.   Noted no overlying erythema, induration, or other signs of local infection.   Skin prepped in a sterile fashion.   Local anesthesia: Topical Ethyl  chloride.   With sterile technique and under real time ultrasound guidance:  40 mg of Kenalog and 4 mL of Marcaine injected easily.   Completed without difficulty   Pain immediately resolved suggesting accurate placement of the medication.   Advised to call if fevers/chills, erythema, induration, drainage, or persistent bleeding.   Images permanently stored and available for review in the ultrasound unit.  Impression: Technically successful ultrasound guided injection.  X-ray images bilateral knees obtained today personally and independently reviewed  Right knee: Moderate medial compartment DJD.  No acute fractures.  Normal-appearing otherwise.  Left knee: Moderate to severe medial compartment DJD.  No fractures.  Fluffy calcification seen anterior knee on lateral imaging likely calcifications Hoffa fat pad.  Await formal radiology review      Impression and Recommendations:    Assessment and Plan: 69 y.o. female with bilateral knee pain left worse than right.  Pain due to DJD.  Discussed treatment plan and options.  Patient is already tried typical conservative management.  Recommend quad strengthening and weight loss.  Additionally will proceed with steroid injection left knee as above.  Will return in 1 week if doing well for right knee injection.  Otherwise recheck in 1 month.  If not improving could consider hyaluronic acid or PRP injections.[name]  PDMP not reviewed this encounter. Orders Placed This Encounter  Procedures  . [place] LIMITED JOINT SPACE STRUCTURES LOW LEFT(NO LINKED CHARGES)    Order Specific Question:   Reason for Exam (SYMPTOM  OR DIAGNOSIS REQUIRED)    Answer:   left knee injection    Order Specific Question:   Preferred imaging location?  Answer:   [place]  . DG Knee 4 Views W/Patella Right    Standing Status:   Future    Number of Occurrences:   1    Standing Expiration Date:   02/18/2021    Order Specific Question:   Reason for Exam  (SYMPTOM  OR DIAGNOSIS REQUIRED)    Answer:   eval BL knee pain    Order Specific Question:   Preferred imaging location?    Answer:   [facility]    Order Specific Question:   Radiology Contrast Protocol - do NOT remove file path    Answer:   \\[email or web address].[email or web address]  . DG Knee 4 Views W/Patella Left    Standing Status:   Future    Number of Occurrences:   1    Standing Expiration Date:   02/18/2021    Order Specific Question:   Reason for Exam (SYMPTOM  OR DIAGNOSIS REQUIRED)    Answer:   eval BL knee pain. S/p injection today    Order Specific Question:   Preferred imaging location?    Answer:   [facility]    Order Specific Question:   Radiology Contrast Protocol - do NOT remove file path    Answer:   \\[email or web address]\[email or web address]\DXFluoroContrastProtocols.pdf   No orders of the defined types were placed in this encounter.   Discussed warning signs or symptoms. Please see discharge instructions. Patient expresses understanding.   The above documentation has been reviewed and is accurate and complete Lynne Leader

## 2019-12-22 NOTE — Assessment & Plan Note (Signed)
Amlodipine, Losartan, Triamt-HCTZ 0.5 tab Doing well

## 2019-12-22 NOTE — Progress Notes (Signed)
Subjective:  Patient ID: Jane Mooney, female    DOB: 07/07/1951  Age: 69 y.o. MRN: [id number]  CC: No chief complaint on file.   HPI KALEIGHA WHAN presents for decreased GFR, HTN, OA  Outpatient Medications Prior to Visit  Medication Sig Dispense Refill  . amLODipine (NORVASC) 10 MG tablet TAKE 1 TABLET BY MOUTH EVERY DAY 90 tablet 3  . benzonatate (TESSALON) 100 MG capsule Take 1 capsule (100 mg total) by mouth 3 (three) times daily as needed. 20 capsule 0  . cholecalciferol (VITAMIN D) 1000 units tablet Take 2 tablets (2,000 Units total) by mouth daily. 100 tablet 11  . Cyanocobalamin (VITAMIN B-12) 1000 MCG SUBL Place 1 tablet (1,000 mcg total) under the tongue daily. 100 tablet 3  . fish oil-omega-3 fatty acids 1000 MG capsule Take 1 g by mouth daily.      [name] GAVILAX 17 GM/SCOOP powder TAKE 17 G BY MOUTH 2 (TWO) TIMES DAILY AS NEEDED. 510 g 1  . HYDROcodone-homatropine (HYCODAN) 5-1.5 MG/5ML syrup Take 5 mLs by mouth every 8 (eight) hours as needed for cough. 240 mL 0  . LORazepam (ATIVAN) 1 MG tablet TAKE 1 TABLET BY MOUTH TWICE A DAY AS NEEDED FOR ANXIETY. 60 tablet 3  . meloxicam (MOBIC) 7.5 MG tablet Take 1-2 tablets (7.5-15 mg total) by mouth daily as needed for pain (take pc). 60 tablet 3  . telmisartan (MICARDIS) 80 MG tablet Take 1 tablet (80 mg total) by mouth daily. 90 tablet 3  . triamcinolone ointment (KENALOG) 0.1 % Apply 1 application topically 4 (four) times daily. 80 g 1  . triamterene-hydrochlorothiazide (MAXZIDE-25) 37.5-25 MG tablet TAKE 1 TABLET BY MOUTH EVERY DAY 90 tablet 3  . amoxicillin (AMOXIL) 500 MG capsule amoxicillin 500 mg capsule    . aspirin 81 MG EC tablet aspirin 81 mg tablet,delayed release  TAKE 1 TABLET BY MOUTH ONCE A DAY    . Docusate Sodium (DSS) 100 MG CAPS docusate sodium 100 mg capsule  TAKE 1 CAPSULE BY MOUTH 2 TIMES A DAY    . ergocalciferol (VITAMIN D2) 1.25 MG (50000 UT) capsule ergocalciferol (vitamin D2) 1,250 mcg (50,000 unit)  capsule    . losartan (COZAAR) 100 MG tablet losartan 100 mg tablet  TAKE 1 TABLET BY MOUTH EVERY DAY     Facility-Administered Medications Prior to Visit  Medication Dose Route Frequency Provider Last Rate Last Admin  . methylPREDNISolone acetate (DEPO-MEDROL) injection 40 mg  40 mg Intra-articular Once Nancie Bocanegra V, MD      . methylPREDNISolone acetate (DEPO-MEDROL) injection 80 mg  80 mg Intra-articular Once [place] Hallam, Evie Lacks, MD        ROS: Review of Systems  Constitutional: Negative for activity change, appetite change, chills, fatigue and unexpected weight change.  HENT: Negative for congestion, mouth sores and sinus pressure.   Eyes: Negative for visual disturbance.  Respiratory: Negative for cough and chest tightness.   Gastrointestinal: Negative for abdominal pain and nausea.  Genitourinary: Negative for difficulty urinating, frequency and vaginal pain.  Musculoskeletal: Positive for arthralgias and gait problem. Negative for back pain.  Skin: Negative for pallor and rash.  Neurological: Negative for dizziness, tremors, weakness, numbness and headaches.  Psychiatric/Behavioral: Negative for confusion and sleep disturbance.    Objective:  BP 120/72 (BP Location: Left Arm, Patient Position: Sitting, Cuff Size: Normal)   Pulse 62   Temp 98.7 F (37.1 C) (Oral)   Ht 5' 3.5" (1.613 m)   Wt 183 lb  2 oz (83.1 kg)   SpO2 99%   BMI 31.93 kg/m   BP Readings from Last 3 Encounters:  12/22/19 120/72  09/22/19 126/64  01/27/19 132/82    Wt Readings from Last 3 Encounters:  12/22/19 183 lb 2 oz (83.1 kg)  09/22/19 183 lb (83 kg)  01/27/19 172 lb (78 kg)    Physical Exam Constitutional:      General: She is not in acute distress.    Appearance: She is well-developed.  HENT:     Head: Normocephalic.     Right Ear: External ear normal.     Left Ear: External ear normal.     Nose: Nose normal.  Eyes:     General:        Right eye: No discharge.        Left  eye: No discharge.     Conjunctiva/sclera: Conjunctivae normal.     Pupils: Pupils are equal, round, and reactive to light.  Neck:     Thyroid: No thyromegaly.     Vascular: No JVD.     Trachea: No tracheal deviation.  Cardiovascular:     Rate and Rhythm: Normal rate and regular rhythm.     Heart sounds: Normal heart sounds.  Pulmonary:     Effort: No respiratory distress.     Breath sounds: No stridor. No wheezing.  Abdominal:     General: Bowel sounds are normal. There is no distension.     Palpations: Abdomen is soft. There is no mass.     Tenderness: There is no abdominal tenderness. There is no guarding or rebound.  Musculoskeletal:        General: Tenderness present.     Cervical back: Normal range of motion and neck supple.  Lymphadenopathy:     Cervical: No cervical adenopathy.  Skin:    Findings: No erythema or rash.  Neurological:     Cranial Nerves: No cranial nerve deficit.     Motor: No abnormal muscle tone.     Coordination: Coordination normal.     Deep Tendon Reflexes: Reflexes normal.  Psychiatric:        Behavior: Behavior normal.        Thought Content: Thought content normal.        Judgment: Judgment normal.    stiff knees B  Lab Results  Component Value Date   WBC 5.8 09/23/2019   HGB 11.6 (L) 09/23/2019   HCT 35.2 (L) 09/23/2019   PLT 230.0 09/23/2019   GLUCOSE 105 (H) 12/21/2019   CHOL 243 (H) 09/23/2019   TRIG 167.0 (H) 09/23/2019   HDL 40.70 09/23/2019   LDLDIRECT 153.6 07/06/2013   LDLCALC 169 (H) 09/23/2019   ALT 9 09/23/2019   AST 15 09/23/2019   NA 138 12/21/2019   K 4.1 12/21/2019   CL 104 12/21/2019   CREATININE 1.08 12/21/2019   BUN 18 12/21/2019   CO2 28 12/21/2019   TSH 1.71 09/23/2019   HGBA1C 5.8 07/28/2014    US Renal  Result Date: 10/07/2019 CLINICAL DATA:  Chronic kidney disease. EXAM: RENAL / URINARY TRACT ULTRASOUND COMPLETE COMPARISON:  None. FINDINGS: Right Kidney: Renal measurements: 9.3 x 3.7 x 5.6 cm =  volume: 100 mL . Echogenicity within normal limits. No mass or hydronephrosis visualized. 3.4 cm simple cyst in the lower pole. Left Kidney: Renal measurements: 10.5 x 6.2 x 5.3 cm = volume: 182 mL. Echogenicity within normal limits. No mass or hydronephrosis visualized. Bladder: Appears normal for degree of  bladder distention. Other: None. IMPRESSION: 1. No acute abnormality. 2. 3.4 cm right renal simple cyst. Electronically Signed   By: Titus Dubin M.D.   On: 10/07/2019 14:52    Assessment & Plan:    [name], MD

## 2019-12-22 NOTE — Patient Instructions (Addendum)
Thank you for coming in today. You had a L knee injection.  Call or go to the ER if you develop a large red swollen joint with extreme pain or oozing puss.  Get xray of your knees today on the way out.  Continue current treatment for knee pain.  Recheck in about 1 month.  Ok to return in 1 week for right knee injection if the left did OK.  Let me know if you have any problems.

## 2019-12-22 NOTE — Assessment & Plan Note (Signed)
Vit D 

## 2019-12-22 NOTE — Assessment & Plan Note (Signed)
Better D/c meloxicam, reduced triamt/HCT

## 2019-12-22 NOTE — Patient Instructions (Addendum)
Try Turmeric for arthritis 

## 2019-12-23 NOTE — Progress Notes (Signed)
Mild to moderate right knee arthritis present.

## 2019-12-23 NOTE — Progress Notes (Signed)
Moderate to severe left knee arthritis present

## 2019-12-25 ENCOUNTER — Ambulatory Visit: Payer: Medicare HMO

## 2019-12-26 ENCOUNTER — Ambulatory Visit: Payer: Medicare HMO | Attending: Internal Medicine

## 2019-12-26 ENCOUNTER — Other Ambulatory Visit: Payer: Self-pay | Admitting: Internal Medicine

## 2019-12-26 DIAGNOSIS — Z23 Encounter for immunization: Secondary | ICD-10-CM | POA: Insufficient documentation

## 2019-12-26 NOTE — Progress Notes (Signed)
   Covid-19 Vaccination Clinic  Name:  Jane Mooney    MRN: [id number] DOB: 1951-08-03  12/26/2019  Jane Mooney was observed post Covid-19 immunization for 15 minutes without incidence. She was provided with Vaccine Information Sheet and instruction to access the V-Safe system.   Jane Mooney was instructed to call 911 with any severe reactions post vaccine: [name] Difficulty breathing  . Swelling of your face and throat  . A fast heartbeat  . A bad rash all over your body  . Dizziness and weakness    Immunizations Administered    Name Date Dose VIS Date Route   Pfizer COVID-19 Vaccine 12/26/2019  1:38 PM 0.3 mL 10/23/2019 Intramuscular   Manufacturer: [place]   Lot: [id number]   [place]: [id number]

## 2019-12-28 ENCOUNTER — Other Ambulatory Visit: Payer: Self-pay | Admitting: Internal Medicine

## 2019-12-30 ENCOUNTER — Other Ambulatory Visit: Payer: Self-pay

## 2019-12-30 ENCOUNTER — Ambulatory Visit: Payer: Medicare HMO | Admitting: Family Medicine

## 2019-12-30 ENCOUNTER — Ambulatory Visit: Payer: Self-pay

## 2019-12-30 DIAGNOSIS — G8929 Other chronic pain: Secondary | ICD-10-CM

## 2019-12-30 DIAGNOSIS — M25562 Pain in left knee: Secondary | ICD-10-CM

## 2019-12-30 DIAGNOSIS — M25561 Pain in right knee: Secondary | ICD-10-CM | POA: Diagnosis not present

## 2019-12-30 NOTE — Patient Instructions (Signed)
Thank you for coming in today. Call or go to the ER if you develop a large red swollen joint with extreme pain or oozing puss.  Recheck as scheduled in about 1 month.  Return sooner if needed.

## 2019-12-30 NOTE — Progress Notes (Signed)
  Patient presents to clinic today for previously arranged knee injection.  She was seen last week diagnosed with bilateral knee pain due to DJD and had left knee injection worked well.  She is here for her right knee injection.  Procedure: Real-time Ultrasound Guided Injection of right knee Device: Philips Affiniti 50G Images permanently stored and available for review in the ultrasound unit. Verbal informed consent obtained.  Discussed risks and benefits of procedure. Warned about infection bleeding damage to structures skin hypopigmentation and fat atrophy among others. Patient expresses understanding and agreement Time-out conducted.   Noted no overlying erythema, induration, or other signs of local infection.   Skin prepped in a sterile fashion.   Local anesthesia: Topical Ethyl chloride.   With sterile technique and under real time ultrasound guidance:  40 mg of Kenalog and 4 mL of Marcaine injected easily.   Completed without difficulty   Pain immediately resolved suggesting accurate placement of the medication.   Advised to call if fevers/chills, erythema, induration, drainage, or persistent bleeding.   Images permanently stored and available for review in the ultrasound unit.  Impression: Technically successful ultrasound guided injection.    Recheck as scheduled in about a month.

## 2020-01-18 ENCOUNTER — Ambulatory Visit: Payer: Medicare HMO | Attending: Internal Medicine

## 2020-01-18 DIAGNOSIS — Z23 Encounter for immunization: Secondary | ICD-10-CM | POA: Insufficient documentation

## 2020-01-18 NOTE — Progress Notes (Signed)
   Covid-19 Vaccination Clinic  Name:  Jane Mooney    MRN: [id number] DOB: Mar 12, 1951  01/18/2020  [name] was observed post Covid-19 immunization for 15 minutes without incident. She was provided with Vaccine Information Sheet and instruction to access the V-Safe system.   [name] was instructed to call 911 with any severe reactions post vaccine: [name] Difficulty breathing  . Swelling of face and throat  . A fast heartbeat  . A bad rash all over body  . Dizziness and weakness   Immunizations Administered    Name Date Dose VIS Date Route   Pfizer COVID-19 Vaccine 01/18/2020 12:40 PM 0.3 mL 10/23/2019 Intramuscular   Manufacturer: [place]   Lot: [id number]   [place]: [id number]

## 2020-01-27 ENCOUNTER — Ambulatory Visit: Payer: Self-pay

## 2020-01-27 ENCOUNTER — Ambulatory Visit: Payer: Medicare HMO | Admitting: Family Medicine

## 2020-01-27 ENCOUNTER — Encounter: Payer: Self-pay | Admitting: Family Medicine

## 2020-01-27 ENCOUNTER — Other Ambulatory Visit: Payer: Self-pay

## 2020-01-27 VITALS — BP 120/78 | HR 61 | Ht 63.5 in | Wt 181.2 lb

## 2020-01-27 DIAGNOSIS — M25532 Pain in left wrist: Secondary | ICD-10-CM | POA: Diagnosis not present

## 2020-01-27 DIAGNOSIS — M778 Other enthesopathies, not elsewhere classified: Secondary | ICD-10-CM | POA: Diagnosis not present

## 2020-01-27 NOTE — Patient Instructions (Addendum)
Thank you for coming in today. Use the bigger wrist brace as needed.  Use the voltaren gel on the wrist up to 4x daily.  Do the exercises.  Attend Occupational Therapy for wrist tendonitis.   Recheck in 4-6 weeks.  Let me know sooner if not doing well.   Please perform the exercise program that we have prepared for you and gone over in detail on a daily basis.  In addition to the handout you were provided you can access your program through: [email or web address]   Your unique program code is:  [email or web address]    Extensor Carpi Ulnaris Tendinitis Tendinitis is inflammation in the tissues that connect muscles to bone (tendons). The extensor carpi ulnaris (ECU) tendon is located on the back of the wrist, on the side that is closest to the small finger. ECU tendinitis will cause pain on the small finger side of the wrist or forearm. It is a common sports-related injury. What are the causes? This condition may be caused by:  Putting repeated stress on your wrist.  Using the wrong technique while playing sports like golf or tennis.  Weakening of the tendon due to age or a health problem.  Injury or trauma. What increases the risk? You are more likely to develop this condition if:  You play sports that involve repeated motions of the wrist, such as golf, tennis, or rugby.  You are 20 years of age or older.  You have an inflammatory condition, such as rheumatoid arthritis. What are the signs or symptoms? Symptoms of this condition include:  Pain along the small finger side of your wrist or forearm when moving your wrist.  A constant ache on the small finger side of your wrist.  Feeling like your grip is weaker than usual.  A popping, snapping, or tearing feeling in your wrist.  Wrist swelling. How is this diagnosed? This condition may be diagnosed based on:  Your symptoms and medical history.  A physical exam. During the exam, your health care provider will check the strength  of your grip and may ask you to move your wrist. Your health care provider may also order an MRI or ultrasound to check for tears in your ligaments, muscles, or tendons. How is this treated? Treatment for this condition may include:  Resting your wrist.  Wearing a splint on your wrist.  Medicines to help with pain and swelling.  Applying heat or ice to the area to ease pain.  Exercises to help you maintain strength and range of motion in your wrist (physical therapy).  Therapy to help with everyday activities (occupational therapy). If these treatments do not help, you may need an injection of an anti-inflammatory medicine (steroid) mixed with a numbing medicine (local anesthetic). Follow these instructions at home: If you have a splint:   Wear it as told by your health care provider. Remove it only as told by your health care provider.  Loosen the splint if your fingers tingle, become numb, or turn cold and blue.  Keep the splint clean.  If the splint is not waterproof: ? Do not let it get wet. ? Cover it with a watertight covering when you take a bath or shower. ? Remove it for bathing and washing your hand only if instructed by your health care provider. Managing pain, stiffness, and swelling      If directed, put ice on the injured area. ? If you have a removable splint, remove it as told by your health care provider. ?  Put ice in a plastic bag. ? Place a towel between your skin and the bag. ? Leave the ice on for 20 minutes, 2-3 times a day.  If directed, apply heat to the affected area as often as told by your health care provider. Use the heat source that your health care provider recommends, such as a moist heat pack or a heating pad. ? If you have a removable splint, remove it as told by your health care provider. ? Place a towel between your skin and the heat source. ? Leave the heat on for 15-20 minutes. ? Remove the heat if your skin turns bright red. This is  especially important if you are unable to feel pain, heat, or cold. You may have a greater risk of getting burned.  Move your fingers often to reduce stiffness and swelling.  Raise (elevate) the injured area above the level of your heart while you are sitting or lying down. Activity  Avoid activities that cause pain or put strain on your wrist for as long as told by your health care provider.  Do not use your injured hand to support your body weight until your health care provider says that you can.  Return to your normal activities as told by your health care provider. Ask your health care provider what activities are safe for you.  Do exercises as told by your health care provider.  Ask your health care provider when it is safe to drive if you have a splint on your wrist. General instructions  Take over-the-counter and prescription medicines only as told by your health care provider.  Do not use any products that contain nicotine or tobacco, such as cigarettes, e-cigarettes, and chewing tobacco. These can delay healing. If you need help quitting, ask your health care provider.  Keep all follow-up visits as told by your health care provider. This is important. How is this prevented?  Warm up and stretch before being active.  Cool down and stretch after being active.  Give your body time to rest between periods of activity.  Use equipment that fits you and is in good condition.  If you play golf or racket sports, focus on proper form or try to improve your technique.  Maintain physical fitness, including: ? Strength. ? Flexibility. ? Endurance. Contact a health care provider if:  Your pain, tenderness, or swelling gets worse, even if you have had treatment. Get help right away if:  Your pain becomes severe.  You cannot move your wrist. Summary  Extensor carpi ulnaris tendinitis is a condition that will cause pain on the small finger side of the wrist or  forearm.  Treatment for this condition may include wearing a splint, taking pain medicines, applying heat or ice, and physical or occupational therapy.  Avoid activities that cause pain or put strain on your wrist for as long as told by your health care provider.  Return to your normal activities as told by your health care provider. This information is not intended to replace advice given to you by your health care provider. Make sure you discuss any questions you have with your health care provider. Document Revised: 02/24/2019 Document Reviewed: 12/18/2018 Elsevier Patient Education  [place].

## 2020-01-27 NOTE — Progress Notes (Signed)
I, Wendy Poet, LAT, ATC, am serving as scribe for Dr. Lynne Leader.  Jane Mooney is a 69 y.o. female who presents to [facility] [address] at [facility] today for f/u of chronic B knee pain.  She was last seen by Dr. Georgina Snell on 12/30/19 and had a R knee injection after having a L knee injection on 12/22/19.  Since her last visit, pt reports that she is feeling better overall.  She states that her R knee is doing really well but notes some increased pain in her L knee when descending stairs.  She denies any knee swelling.    L wrist pain: She reports ulnar-sided L wrist pain x 3 weeks w/ no known MOI.  Pt denies any L wrist mechanical symptoms and notes slight L wrist swelling.  She has been wearing an OTC wrist brace w/ no relief.  She notes that she started crocheting about a month ago which was about 1 week before the pain started.  Diagnostic imaging: B knee XR- 12/22/19  Pertinent review of systems: No fevers or chills  Relevant historical information: History of vitamin D deficiency   Exam:  BP 120/78 (BP Location: Left Arm, Patient Position: Sitting, Cuff Size: Normal)   Pulse 61   Ht 5' 3.5" (1.613 m)   Wt 181 lb 3.2 oz (82.2 kg)   SpO2 99%   BMI 31.59 kg/m  General: Well Developed, well nourished, and in no acute distress.   MSK:  Left wrist normal-appearing no significant swelling. Normal wrist motion pain with extension and ulnar deviation. Strength intact however pain reproduced with resisted extension or radiation. Finger and hand motion and strength are intact without pain. Pulses cap refill and sensation are intact distally.    Lab and Radiology Results  Diagnostic Limited MSK Ultrasound of: Left dorsal wrist Extensor carpi ulnaris tendon visualized.  At ulnar styloid patient has intact tendon but tendon sheath has increased hypoechoic fluid consistent with tenosynovitis.  Other dorsal tendons are normal-appearing Impression: Extensor carpi ulnaris  tendinitis     Assessment and Plan: 69 y.o. female with left wrist pain due to extensor carpi ulnaris tendinitis.  Plan for Voltaren gel thumb spica splint home exercise program and occupational therapy.  Recheck back in 4 to 6 weeks.  If not better would consider injection.  Knee pain: Much improved.  Watchful waiting home exercise program.  [id number]; 15 additional minutes spent for Therapeutic exercises as stated in above notes.  This included exercises focusing on stretching, strengthening, with significant focus on eccentric aspects.   Long term goals include an improvement in range of motion, strength, endurance as well as avoiding reinjury. Patient's frequency would include in 1-2 times a day, 3-5 times a week for a duration of 6-12 weeks.  Proper technique shown and discussed handout in great detail with ATC.  All questions were discussed and answered.    PDMP not reviewed this encounter. Orders Placed This Encounter  Procedures  . [place] LIMITED JOINT SPACE STRUCTURES UP LEFT(NO LINKED CHARGES)    Order Specific Question:   Reason for Exam (SYMPTOM  OR DIAGNOSIS REQUIRED)    Answer:   L wrist pain    Order Specific Question:   Preferred imaging location?    Answer:   [facility]  . Ambulatory referral to Occupational Therapy    Referral Priority:   Routine    Referral Type:   Occupational Therapy    Referral Reason:   Specialty Services Required  Requested Specialty:   Occupational Therapy    Number of Visits Requested:   1   No orders of the defined types were placed in this encounter.    Discussed warning signs or symptoms. Please see discharge instructions. Patient expresses understanding.   The above documentation has been reviewed and is accurate and complete Lynne Leader

## 2020-02-19 ENCOUNTER — Encounter: Payer: Self-pay | Admitting: Family

## 2020-02-19 ENCOUNTER — Ambulatory Visit (INDEPENDENT_AMBULATORY_CARE_PROVIDER_SITE_OTHER): Payer: Medicare HMO | Admitting: Family

## 2020-02-19 ENCOUNTER — Other Ambulatory Visit: Payer: Self-pay

## 2020-02-19 VITALS — BP 120/76 | HR 59 | Temp 98.5°F | Ht 63.5 in | Wt 180.2 lb

## 2020-02-19 DIAGNOSIS — L509 Urticaria, unspecified: Secondary | ICD-10-CM | POA: Diagnosis not present

## 2020-02-19 MED ORDER — CETIRIZINE HCL 10 MG PO TABS: 10.0000 mg | ORAL_TABLET | Freq: Every day | ORAL | 1 refills | Status: DC

## 2020-02-19 MED ORDER — FAMOTIDINE 20 MG PO TABS: 20.0000 mg | ORAL_TABLET | Freq: Two times a day (BID) | ORAL | 1 refills | Status: DC

## 2020-02-19 NOTE — Progress Notes (Signed)
Jane Mooney is a 69 y.o. female with the following history as recorded in EpicCare:  Patient Active Problem List   Diagnosis Date Noted  . Decreased GFR 12/22/2019  . CRI (chronic renal insufficiency), stage 3 (moderate) 09/26/2019  . Abscess of face 06/13/2017  . Constipation 02/22/2017  . Chest pain, atypical 11/30/2016  . GERD (gastroesophageal reflux disease) 11/30/2016  . Menopause 10/23/2016  . Abscess of finger, left 08/17/2016  . Thoracic back pain 09/29/2015  . Osteoarthritis of both knees 09/29/2015  . Allergic urticaria 03/29/2014  . Grief 03/22/2014  . Urticaria 01/18/2014  . Acute upper respiratory infections of unspecified site 09/02/2013  . Well adult exam 07/02/2012  . Obesity 03/06/2012  . Cervical pain (neck) 12/28/2011  . Neoplasm of uncertain behavior of skin 08/03/2011  . Onychomycosis 08/03/2011  . URI, acute 06/20/2011  . Vitamin B 12 deficiency 05/07/2011  . Hemorrhoid 05/03/2011  . Dyslipidemia 10/31/2010  . TRIGGER FINGER 03/30/2010  . BRONCHITIS, ACUTE 12/15/2009  . WEIGHT LOSS 12/15/2009  . LOW BACK PAIN 10/25/2009  . HYPERGLYCEMIA 10/25/2009  . Vitamin D deficiency 02/24/2009  . VERTIGO 04/09/2008  . KNEE PAIN 03/30/2008  . ALLERGIC RHINITIS 02/13/2008  . Pain in joint, shoulder region 02/13/2008  . Anxiety state 09/30/2007  . Situational depression 09/30/2007  . EAR PAIN 09/30/2007  . Essential hypertension 09/30/2007    Current Outpatient Medications  Medication Sig Dispense Refill  . amLODipine (NORVASC) 10 MG tablet TAKE 1 TABLET BY MOUTH EVERY DAY 90 tablet 3  . benzonatate (TESSALON) 100 MG capsule Take 1 capsule (100 mg total) by mouth 3 (three) times daily as needed. 20 capsule 0  . cholecalciferol (VITAMIN D) 1000 units tablet Take 2 tablets (2,000 Units total) by mouth daily. 100 tablet 11  . Cyanocobalamin (VITAMIN B-12) 1000 MCG SUBL Place 1 tablet (1,000 mcg total) under the tongue daily. 100 tablet 3  . Docusate Sodium  (DSS) 100 MG CAPS docusate sodium 100 mg capsule  TAKE 1 CAPSULE BY MOUTH 2 TIMES A DAY    . ergocalciferol (VITAMIN D2) 1.25 MG (50000 UT) capsule ergocalciferol (vitamin D2) 1,250 mcg (50,000 unit) capsule    . fish oil-omega-3 fatty acids 1000 MG capsule Take 1 g by mouth daily.      [name] GAVILAX 17 GM/SCOOP powder TAKE 17 G BY MOUTH 2 (TWO) TIMES DAILY AS NEEDED. 510 g 1  . LORazepam (ATIVAN) 1 MG tablet TAKE 1 TABLET BY MOUTH TWICE A DAY AS NEEDED FOR ANXIETY 60 tablet 3  . losartan (COZAAR) 100 MG tablet losartan 100 mg tablet  TAKE 1 TABLET BY MOUTH EVERY DAY    . telmisartan (MICARDIS) 80 MG tablet Take 1 tablet (80 mg total) by mouth daily. 90 tablet 3  . triamcinolone ointment (KENALOG) 0.1 % Apply 1 application topically 4 (four) times daily. 80 g 1  . triamterene-hydrochlorothiazide (MAXZIDE-25) 37.5-25 MG tablet Take 0.5 tablets by mouth daily. 90 tablet 3  . cetirizine (ZYRTEC) 10 MG tablet Take 1 tablet (10 mg total) by mouth at bedtime. 30 tablet 1  . famotidine (PEPCID) 20 MG tablet Take 1 tablet (20 mg total) by mouth 2 (two) times daily. 60 tablet 1   Current Facility-Administered Medications  Medication Dose Route Frequency Provider Last Rate Last Admin  . methylPREDNISolone acetate (DEPO-MEDROL) injection 40 mg  40 mg Intra-articular Once Plotnikov, Aleksei V, MD      . methylPREDNISolone acetate (DEPO-MEDROL) injection 80 mg  80 mg Intra-articular Once Plotnikov, Aleksei  V, MD        Allergies: Citalopram, Mirtazapine, and Quinapril hcl  Past Medical History:  Diagnosis Date  . Allergic rhinitis   . Anemia   . Anxiety 2010  . Depression   . DJD (degenerative joint disease)    Left knee  . Heart murmur   . Hemorrhoid   . HTN (hypertension)   . Hyperlipidemia   . LBP (low back pain)   . MVP (mitral valve prolapse)   . PONV (postoperative nausea and vomiting)   . Symptomatic PVCs   . Urticaria    recurrent    Past Surgical History:  Procedure Laterality Date   . ABDOMINAL HYSTERECTOMY  1984  . HEMORRHOID SURGERY    . KNEE ARTHROSCOPY WITH MEDIAL MENISECTOMY Right 05/21/2014   Procedure: RIGHT KNEE ARTHROSCOPY WITH DEBRIDEMENT/SHAVING, MEDIAL MENISECTOMY;  Surgeon: Renette Butters, MD;  Location: [facility];  Service: Orthopedics;  Laterality: Right;  . KNEE SURGERY  03/2007   Left Knee Arthr- Dr Onnie Graham  . ROTATOR CUFF REPAIR  2004   Left   . TONSILLECTOMY    . urtica      Family History  Problem Relation Age of Onset  . Diabetes Other   . Hypertension Other   . Breast cancer Other   . Cancer Other        breast   . Hypertension Mother   . Colon cancer Neg Hx   . Esophageal cancer Neg Hx   . Stomach cancer Neg Hx   . Rectal cancer Neg Hx     Social History   Tobacco Use  . Smoking status: Never Smoker  . Smokeless tobacco: Never Used  Substance Use Topics  . Alcohol use: Yes    Comment: social    Subjective:  Notes she has had problems with "hives" on and off for years; seems to have worsened in the past 2-3 months; denies any new soaps, foods, detergents or medications. Had one of the worst outbreaks she has experienced last night- entire left side of her back erupted; had to take 75 mg of Benadryl and did respond;    Objective:  Vitals:   02/19/20 1503  BP: 120/76  Pulse: (!) 59  Temp: 98.5 F (36.9 C)  TempSrc: Oral  SpO2: 99%  Weight: 180 lb 3.2 oz (81.7 kg)  Height: 5' 3.5" (1.613 m)    General: Well developed, well nourished, in no acute distress  Skin : Warm and dry. Localized hive on right forearm and left upper shoulder Head: Normocephalic and atraumatic  Eyes: Sclera and conjunctiva clear; pupils round and reactive to light; extraocular movements intact  Ears: External normal; canals clear; tympanic membranes normal  Oropharynx: Pink, supple. No suspicious lesions  Neck: Supple without thyromegaly, adenopathy  Lungs: Respirations unlabored; Neurologic: Alert and oriented; speech intact;  face symmetrical; moves all extremities well; CNII-XII intact without focal deficit   Assessment:  1. Urticaria     Plan:  Refer to allergist for further testing and evaluation; Rx for Zyrtec and Pepcid; patient defers steroid injection today; follow up to be determined.  This visit occurred during the SARS-CoV-2 public health emergency.  Safety protocols were in place, including screening questions prior to the visit, additional usage of staff PPE, and extensive cleaning of exam room while observing appropriate contact time as indicated for disinfecting solutions.      No follow-ups on file.  Orders Placed This Encounter  Procedures  . Ambulatory referral to Allergy  Referral Priority:   Routine    Referral Type:   Allergy Testing    Referral Reason:   Specialty Services Required    Requested Specialty:   Allergy    Number of Visits Requested:   1    Requested Prescriptions   Signed Prescriptions Disp Refills  . cetirizine (ZYRTEC) 10 MG tablet 30 tablet 1    Sig: Take 1 tablet (10 mg total) by mouth at bedtime.  . famotidine (PEPCID) 20 MG tablet 60 tablet 1    Sig: Take 1 tablet (20 mg total) by mouth 2 (two) times daily.

## 2020-03-09 ENCOUNTER — Other Ambulatory Visit: Payer: Self-pay

## 2020-03-09 ENCOUNTER — Encounter: Payer: Self-pay | Admitting: Family Medicine

## 2020-03-09 ENCOUNTER — Ambulatory Visit: Payer: Medicare HMO | Admitting: Family Medicine

## 2020-03-09 VITALS — BP 130/80 | HR 66 | Ht 63.5 in | Wt 183.8 lb

## 2020-03-09 DIAGNOSIS — G8929 Other chronic pain: Secondary | ICD-10-CM | POA: Diagnosis not present

## 2020-03-09 DIAGNOSIS — M778 Other enthesopathies, not elsewhere classified: Secondary | ICD-10-CM

## 2020-03-09 DIAGNOSIS — M25561 Pain in right knee: Secondary | ICD-10-CM

## 2020-03-09 DIAGNOSIS — M170 Bilateral primary osteoarthritis of knee: Secondary | ICD-10-CM

## 2020-03-09 DIAGNOSIS — M25562 Pain in left knee: Secondary | ICD-10-CM

## 2020-03-09 NOTE — Progress Notes (Signed)
I, Wendy Poet, LAT, ATC, am serving as scribe for Dr. Lynne Leader.  Jane Mooney is a 69 y.o. female who presents to [facility] today for f/u of L ulnar-sided wrist pain.  She was last seen by Dr. Georgina Snell on 01/27/20 and was provided a wrist/thumb splint and a HEP focusing on wrist extensor and pronation/supination strengthening and wrist stretching.  She was also referred to outpatient OT.  Since her last visit, pt reports that her L wrist is feeling much better.  She still has pain when she lifts something heavier or if she does too much wrist pronation/supination.  She never went to OT and they never called her to schedule an appt.  Knees are still painful. Had BL steroid injection in early to mid February.  She has bothersome pain and stiffness.  She notes the steroid injections only lasted about a month.  She is interested in pursuing hyaluronic acid injections.  She is willing to proceed with total knee replacement especially if she cannot get better with conservative management.   Pertinent review of systems: No fevers or chills  Relevant historical information: Hypertension   Exam:  BP 130/80 (BP Location: Right Arm, Patient Position: Sitting, Cuff Size: Normal)   Pulse 66   Ht 5' 3.5" (1.613 m)   Wt 183 lb 12.8 oz (83.4 kg)   SpO2 99%   BMI 32.05 kg/m  General: Well Developed, well nourished, and in no acute distress.   MSK: Left wrist normal-appearing normal motion nontender.  Right knee: Mild effusion well-appearing otherwise no erythema. Range of motion 0-120 degrees minimal crepitation. Mild tender palpation medial lateral joint line. It stable given his exam. Negative Murray's test. Intact strength.  Left knee: Normal-appearing mild effusion no erythema. Range of motion 0-120 degrees with crepitation. Mildly tender palpation medial lateral joint line. Stable ligaments exam. Negative Murray's test. Tech strength.    Lab and  Radiology Results  EXAM: RIGHT KNEE - COMPLETE 4+ VIEW  COMPARISON:  Radiographs dated 04/16/2014  FINDINGS: There are slight arthritic changes of the medial and patellofemoral compartments. No joint effusion. No joint space narrowing. The findings are new since the prior study of 04/16/2014.  IMPRESSION: New slight arthritic changes of the medial and patellofemoral compartments of the right knee.   Electronically Signed   By: Lorriane Shire M.D.   On: 12/22/2019 15:24  EXAM: LEFT KNEE - COMPLETE 4+ VIEW  COMPARISON:  MRI dated 03/15/2007  FINDINGS: There is moderately severe arthritis of the medial compartment. Calcified loose body in the joint in Hoffa's fat pad adjacent Hoffa's fat pad. Slight arthritis of the patellofemoral compartment. Small loose body in the posterior aspect of the joint. No effusion.  IMPRESSION: Moderately severe osteoarthritis of the medial compartment of the left knee. Loose bodies in the joint.   Electronically Signed   By: Lorriane Shire M.D.   On: 12/22/2019 15:26  I, Lynne Leader, personally (independently) visualized and performed the interpretation of the images attached in this note.    Assessment and Plan: 69 y.o. female with left wrist pain due to extensor carpi ulnaris tendinitis.  Significant improvement with home exercise program Voltaren gel and intermittent bracing.  Watchful waiting recheck as needed.  Bilateral knee pain due to DJD.  Steroid injections worked only for about a month.  Not optimistic that repeat steroid injections will be helpful.  We will proceed with trial of hyaluronic acid injections.  We will work on authorizing  them now.  Also spent time discussing total knee replacement.  Return to clinic for hyaluronic acid injection series.    Discussed warning signs or symptoms. Please see discharge instructions. Patient expresses understanding.   The above documentation has been reviewed and is accurate  and complete Lynne Leader

## 2020-03-09 NOTE — Patient Instructions (Addendum)
Thank you for coming in today.  We will work on authorization for the gel shots.  Expect to hear soon about the gel shots.  Continue the wrist activity as you need to.  Ok to do steroid injection on special occasions if needed.  Hopefully the gel shots will provide lasting benefit.   Sodium Hyaluronate intra-articular injection What is this medicine? SODIUM HYALURONATE (SOE dee um hye al yoor ON ate) is used to treat pain in the knee due to osteoarthritis. This medicine may be used for other purposes; ask your health care provider or pharmacist if you have questions. COMMON BRAND NAME(S): Amvisc, DUROLANE, Euflexxa, GELSYN-3, Hyalgan, Hymovis, Monovisc, Orthovisc, Supartz, Supartz FX, TriVisc, VISCO What should I tell my health care provider before I take this medicine? They need to know if you have any of these conditions:  bleeding disorders  glaucoma  infection in the knee joint  skin conditions or sensitivity  skin infection  an unusual allergic reaction to sodium hyaluronate, other medicines, foods, dyes, or preservatives. Different brands of sodium hyaluronate contain different allergens. Some may contain egg. Talk to your doctor about your allergies to make sure that you get the right product.  pregnant or trying to get pregnant  breast-feeding How should I use this medicine? This medicine is for injection into the knee joint. It is given by a health care professional in a hospital or clinic setting. Talk to your pediatrician regarding the use of this medicine in children. Special care may be needed. Overdosage: If you think you have taken too much of this medicine contact a poison control center or emergency room at once. NOTE: This medicine is only for you. Do not share this medicine with others. What if I miss a dose? This does not apply. What may interact with this medicine? Interactions are not expected. This list may not describe all possible interactions. Give  your health care provider a list of all the medicines, herbs, non-prescription drugs, or dietary supplements you use. Also tell them if you smoke, drink alcohol, or use illegal drugs. Some items may interact with your medicine. What should I watch for while using this medicine? Tell your doctor or healthcare professional if your symptoms do not start to get better or if they get worse. If receiving this medicine for osteoarthritis, limit your activity after you receive your injection. Avoid physical activity for 48 hours following your injection to keep your knee from swelling. Do not stand on your feet for more than 1 hour at a time during the first 48 hours following your injection. Ask your doctor or healthcare professional about when you can begin major physical activity again. What side effects may I notice from receiving this medicine? Side effects that you should report to your doctor or health care professional as soon as possible:  allergic reactions like skin rash, itching or hives, swelling of the face, lips, or tongue  dizziness  facial flushing  pain, tingling, numbness in the hands or feet  vision changes if received this medicine during eye surgery Side effects that usually do not require medical attention (report to your doctor or health care professional if they continue or are bothersome):  back pain  bruising at site where injected  chills  diarrhea  fever  headache  joint pain  joint stiffness  joint swelling  muscle cramps  muscle pain  nausea, vomiting  pain, redness, or irritation at site where injected  weak or tired This list may not  describe all possible side effects. Call your doctor for medical advice about side effects. You may report side effects to FDA at 1-800-FDA-1088. Where should I keep my medicine? This drug is given in a hospital or clinic and will not be stored at home. NOTE: This sheet is a summary. It may not cover all possible  information. If you have questions about this medicine, talk to your doctor, pharmacist, or health care provider.  2020 Elsevier/Gold Standard (2015-12-01 08:34:51)

## 2020-03-12 ENCOUNTER — Other Ambulatory Visit: Payer: Self-pay | Admitting: Family

## 2020-03-14 ENCOUNTER — Other Ambulatory Visit: Payer: Self-pay | Admitting: Family

## 2020-03-22 ENCOUNTER — Ambulatory Visit: Payer: Medicare HMO | Admitting: Internal Medicine

## 2020-03-31 ENCOUNTER — Other Ambulatory Visit: Payer: Self-pay

## 2020-03-31 ENCOUNTER — Ambulatory Visit: Payer: Medicare HMO | Admitting: Allergy & Immunology

## 2020-03-31 ENCOUNTER — Encounter: Payer: Self-pay | Admitting: Allergy & Immunology

## 2020-03-31 VITALS — BP 138/80 | HR 61 | Temp 97.1°F | Resp 16 | Ht 62.5 in | Wt 181.0 lb

## 2020-03-31 DIAGNOSIS — L500 Allergic urticaria: Secondary | ICD-10-CM | POA: Diagnosis not present

## 2020-03-31 DIAGNOSIS — J3089 Other allergic rhinitis: Secondary | ICD-10-CM

## 2020-03-31 DIAGNOSIS — J302 Other seasonal allergic rhinitis: Secondary | ICD-10-CM

## 2020-03-31 MED ORDER — FLUTICASONE PROPIONATE 50 MCG/ACT NA SUSP: 1.0000 | Freq: Every day | NASAL | 3 refills | Status: DC

## 2020-03-31 MED ORDER — CETIRIZINE HCL 10 MG PO TABS: 10.0000 mg | ORAL_TABLET | Freq: Two times a day (BID) | ORAL | 3 refills | Status: DC

## 2020-03-31 NOTE — Patient Instructions (Addendum)
1. Seasonal and perennial allergic rhinitis - Testing today showed: grasses, ragweed, weeds, trees, indoor molds, outdoor molds, dust mites and cat - Copy of test results provided.  - Avoidance measures provided. - Continue with: Zyrtec (cetirizine) 74m but increase to TWO TIMES daily - Start taking: Flonase (fluticasone) one spray per nostril daily - You can use an extra dose of the antihistamine, if needed, for breakthrough symptoms.  - Consider nasal saline rinses 1-2 times daily to remove allergens from the nasal cavities as well as help with mucous clearance (this is especially helpful to do before the nasal sprays are given) - Consider allergy shots as a means of long-term control. - Allergy shots "re-train" and "reset" the immune system to ignore environmental allergens and decrease the resulting immune response to those allergens (sneezing, itchy watery eyes, runny nose, nasal congestion, etc).    - Allergy shots improve symptoms in 75-85% of patients.  - We can discuss more at the next appointment if the medications are not working for you.  2. Allergic urticaria - Your history does not have any "red flags" such as fevers, joint pains, or permanent skin changes that would be concerning for a more serious cause of hives.  - We will get some labs to rule out serious causes of hives: alpha gal panel, tryptase level, ANA, ESR, and CRP. - Chronic hives are often times a self limited process and will "burn themselves out" over 6-12 months, although this is not always the case.  - In the meantime, start suppressive dosing of antihistamines:   - Morning: Zyrtec (cetirizine) 152m(one tablet) + Pepcid (famotidine) 2037m- Evening: Zyrtec (cetirizine) 103m37mne tablet) + Pepcid (famotidine) 20mg60mou can change this dosing at home, decreasing the dose as needed or increasing the dosing as needed.  - If you are not tolerating the medications or are tired of taking them every day, we can start  treatment with a monthly injectable medication called Xolair.   3. Return in about 6 weeks (around 05/12/2020). This can be an in-person, a virtual Webex or a telephone follow up visit.   Please inform [facility] visits, hospitalizations, or changes in symptoms. Call us [facility] going to the ED for breathing or allergy symptoms since we might be able to fit you in for a sick visit. Feel free to contact us an[place]ime with any questions, problems, or concerns.  It was a pleasure to meet you today!  Websites that have reliable patient information: 1. American Academy of Asthma, Allergy, and Immunology: www.aaaai.org 2. Food Allergy Research and Education (FARE): foodallergy.org 3. Mothers of Asthmatics: http://www.asthmacommunitynetwork.org 4. American College of Allergy, Asthma, and Immunology: www.acaai.org   COVID-19 Vaccine Information can be found at: [email or web address] related to vaccine distribution or appointments, please email [email or web address] or call [phone] "Like" us onK[place]acebook and Instagram for our latest updates!       HAPPY SPRING!  Make sure you are registered to vote! If you have moved or changed any of your contact information, you will need to get this updated before voting!  In some cases, you MAY be able to register to vote online: [email or web address] Exposure  The American Academy of Allergy, Asthma and Immunology suggests the following steps to reduce your exposure to pollen during allergy seasons.    1. Do not hang sheets or clothing out to dry; pollen may collect on these items. 2. Do  not mow lawns or spend time around freshly cut grass; mowing stirs up pollen. 3. Keep windows closed at night.  Keep car windows closed while driving. 4. Minimize morning activities outdoors, a time when pollen counts are  usually at their highest. 5. Stay indoors as much as possible when pollen counts or humidity is high and on windy days when pollen tends to remain in the air longer. 6. Use air conditioning when possible.  Many air conditioners have filters that trap the pollen spores. 7. Use a HEPA room air filter to remove pollen form the indoor air you breathe.  Control of Mold Allergen   Mold and fungi can grow on a variety of surfaces provided certain temperature and moisture conditions exist.  Outdoor molds grow on plants, decaying vegetation and soil.  The major outdoor mold, Alternaria and Cladosporium, are found in very high numbers during hot and dry conditions.  Generally, a late Summer - Fall peak is seen for common outdoor fungal spores.  Rain will temporarily lower outdoor mold spore count, but counts rise rapidly when the rainy period ends.  The most important indoor molds are Aspergillus and Penicillium.  Dark, humid and poorly ventilated basements are ideal sites for mold growth.  The next most common sites of mold growth are the bathroom and the kitchen.  Outdoor (Seasonal) Mold Control  Positive outdoor molds via skin testing: Bipolaris (Helminthsporium), Drechslera (Curvalaria) and Mucor  1. Use air conditioning and keep windows closed 2. Avoid exposure to decaying vegetation. 3. Avoid leaf raking. 4. Avoid grain handling. 5. Consider wearing a face mask if working in moldy areas.  6.   Indoor (Perennial) Mold Control   Positive indoor molds via skin testing: Fusarium, Aureobasidium (Pullulara) and Rhizopus  1. Maintain humidity below 50%. 2. Clean washable surfaces with 5% bleach solution. 3. Remove sources e.g. contaminated carpets.     Control of Dust Mite Allergen    Dust mites play a major role in allergic asthma and rhinitis.  They occur in environments with high humidity wherever human skin is found.  Dust mites absorb humidity from the atmosphere (ie, they do not drink)  and feed on organic matter (including shed human and animal skin).  Dust mites are a microscopic type of insect that you cannot see with the naked eye.  High levels of dust mites have been detected from mattresses, pillows, carpets, upholstered furniture, bed covers, clothes, soft toys and any woven material.  The principal allergen of the dust mite is found in its feces.  A gram of dust may contain 1,000 mites and 250,000 fecal particles.  Mite antigen is easily measured in the air during house cleaning activities.  Dust mites do not bite and do not cause harm to humans, other than by triggering allergies/asthma.    Ways to decrease your exposure to dust mites in your home:  1. Encase mattresses, box springs and pillows with a mite-impermeable barrier or cover   2. Wash sheets, blankets and drapes weekly in hot water (130 F) with detergent and dry them in a dryer on the hot setting.  3. Have the room cleaned frequently with a vacuum cleaner and a damp dust-mop.  For carpeting or rugs, vacuuming with a vacuum cleaner equipped with a high-efficiency particulate air (HEPA) filter.  The dust mite allergic individual should not be in a room which is being cleaned and should wait 1 hour after cleaning before going into the room. 4. Do not sleep on upholstered  furniture (eg, couches).   5. If possible removing carpeting, upholstered furniture and drapery from the home is ideal.  Horizontal blinds should be eliminated in the rooms where the person spends the most time (bedroom, study, television room).  Washable vinyl, roller-type shades are optimal. 6. Remove all non-washable stuffed toys from the bedroom.  Wash stuffed toys weekly like sheets and blankets above.   7. Reduce indoor humidity to less than 50%.  Inexpensive humidity monitors can be purchased at most hardware stores.  Do not use a humidifier as can make the problem worse and are not recommended.  Control of Dog or Cat Allergen  Avoidance is the  best way to manage a dog or cat allergy. If you have a dog or cat and are allergic to dog or cats, consider removing the dog or cat from the home. If you have a dog or cat but don't want to find it a new home, or if your family wants a pet even though someone in the household is allergic, here are some strategies that may help keep symptoms at bay:  1. Keep the pet out of your bedroom and restrict it to only a few rooms. Be advised that keeping the dog or cat in only one room will not limit the allergens to that room. 2. Don't pet, hug or kiss the dog or cat; if you do, wash your hands with soap and water. 3. High-efficiency particulate air (HEPA) cleaners run continuously in a bedroom or living room can reduce allergen levels over time. 4. Regular use of a high-efficiency vacuum cleaner or a central vacuum can reduce allergen levels. 5. Giving your dog or cat a bath at least once a week can reduce airborne allergen.  Allergy Shots   Allergies are the result of a chain reaction that starts in the immune system. Your immune system controls how your body defends itself. For instance, if you have an allergy to pollen, your immune system identifies pollen as an invader or allergen. Your immune system overreacts by producing antibodies called Immunoglobulin E (IgE). These antibodies travel to cells that release chemicals, causing an allergic reaction.  The concept behind allergy immunotherapy, whether it is received in the form of shots or tablets, is that the immune system can be desensitized to specific allergens that trigger allergy symptoms. Although it requires time and patience, the payback can be long-term relief.  How Do Allergy Shots Work?  Allergy shots work much like a vaccine. Your body responds to injected amounts of a particular allergen given in increasing doses, eventually developing a resistance and tolerance to it. Allergy shots can lead to decreased, minimal or no allergy  symptoms.  There generally are two phases: build-up and maintenance. Build-up often ranges from three to six months and involves receiving injections with increasing amounts of the allergens. The shots are typically given once or twice a week, though more rapid build-up schedules are sometimes used.  The maintenance phase begins when the most effective dose is reached. This dose is different for each person, depending on how allergic you are and your response to the build-up injections. Once the maintenance dose is reached, there are longer periods between injections, typically two to four weeks.  Occasionally doctors give cortisone-type shots that can temporarily reduce allergy symptoms. These types of shots are different and should not be confused with allergy immunotherapy shots.  Who Can Be Treated with Allergy Shots?  Allergy shots may be a good treatment approach for people  with allergic rhinitis (hay fever), allergic asthma, conjunctivitis (eye allergy) or stinging insect allergy.   Before deciding to begin allergy shots, you should consider:  . The length of allergy season and the severity of your symptoms . Whether medications and/or changes to your environment can control your symptoms . Your desire to avoid long-term medication use . Time: allergy immunotherapy requires a major time commitment . Cost: may vary depending on your insurance coverage  Allergy shots for children age 46 and older are effective and often well tolerated. They might prevent the onset of new allergen sensitivities or the progression to asthma.  Allergy shots are not started on patients who are pregnant but can be continued on patients who become pregnant while receiving them. In some patients with other medical conditions or who take certain common medications, allergy shots may be of risk. It is important to mention other medications you talk to your allergist.   When Will I Feel Better?  Some may  experience decreased allergy symptoms during the build-up phase. For others, it may take as long as 12 months on the maintenance dose. If there is no improvement after a year of maintenance, your allergist will discuss other treatment options with you.  If you aren't responding to allergy shots, it may be because there is not enough dose of the allergen in your vaccine or there are missing allergens that were not identified during your allergy testing. Other reasons could be that there are high levels of the allergen in your environment or major exposure to non-allergic triggers like tobacco smoke.  What Is the Length of Treatment?  Once the maintenance dose is reached, allergy shots are generally continued for three to five years. The decision to stop should be discussed with your allergist at that time. Some people may experience a permanent reduction of allergy symptoms. Others may relapse and a longer course of allergy shots can be considered.  What Are the Possible Reactions?  The two types of adverse reactions that can occur with allergy shots are local and systemic. Common local reactions include very mild redness and swelling at the injection site, which can happen immediately or several hours after. A systemic reaction, which is less common, affects the entire body or a particular body system. They are usually mild and typically respond quickly to medications. Signs include increased allergy symptoms such as sneezing, a stuffy nose or hives.  Rarely, a serious systemic reaction called anaphylaxis can develop. Symptoms include swelling in the throat, wheezing, a feeling of tightness in the chest, nausea or dizziness. Most serious systemic reactions develop within 30 minutes of allergy shots. This is why it is strongly recommended you wait in your doctor's office for 30 minutes after your injections. Your allergist is trained to watch for reactions, and his or her staff is trained and equipped  with the proper medications to identify and treat them.  Who Should Administer Allergy Shots?  The preferred location for receiving shots is your prescribing allergist's office. Injections can sometimes be given at another facility where the physician and staff are trained to recognize and treat reactions, and have received instructions by your prescribing allergist.

## 2020-03-31 NOTE — Progress Notes (Signed)
NEW PATIENT  Date of Service/Encounter:  03/31/20  Referring provider: Cassandria Anger, MD   Assessment:   Seasonal and perennial allergic rhinitis (grasses, ragweed, weeds, trees, indoor molds, outdoor molds, dust mites and cat)  Allergic urticaria  Plan/Recommendations:   1. Seasonal and perennial allergic rhinitis - Testing today showed: grasses, ragweed, weeds, trees, indoor molds, outdoor molds, dust mites and cat - Copy of test results provided.  - Avoidance measures provided. - Continue with: Zyrtec (cetirizine) 31m but increase to TWO TIMES daily - Start taking: Flonase (fluticasone) one spray per nostril daily - You can use an extra dose of the antihistamine, if needed, for breakthrough symptoms.  - Consider nasal saline rinses 1-2 times daily to remove allergens from the nasal cavities as well as help with mucous clearance (this is especially helpful to do before the nasal sprays are given) - Consider allergy shots as a means of long-term control. - Allergy shots "re-train" and "reset" the immune system to ignore environmental allergens and decrease the resulting immune response to those allergens (sneezing, itchy watery eyes, runny nose, nasal congestion, etc).    - Allergy shots improve symptoms in 75-85% of patients.  - We can discuss more at the next appointment if the medications are not working for you.  2. Allergic urticaria - Your history does not have any "red flags" such as fevers, joint pains, or permanent skin changes that would be concerning for a more serious cause of hives.  - We will get some labs to rule out serious causes of hives: alpha gal panel, tryptase level, ANA, ESR, and CRP. - Chronic hives are often times a self limited process and will "burn themselves out" over 6-12 months, although this is not always the case.  - In the meantime, start suppressive dosing of antihistamines:   - Morning: Zyrtec (cetirizine) 171m(one tablet) + Pepcid  (famotidine) 2051m- Evening: Zyrtec (cetirizine) 33m55mne tablet) + Pepcid (famotidine) 20mg16mou can change this dosing at home, decreasing the dose as needed or increasing the dosing as needed.  - If you are not tolerating the medications or are tired of taking them every day, we can start treatment with a monthly injectable medication called Xolair.   3. Return in about 6 weeks (around 05/12/2020). This can be an in-person, a virtual Webex or a telephone follow up visit.  Subjective:   Jane Mooney 68 y.26 female presenting today for evaluation of  Chief Complaint  Patient presents with  . Urticaria    Jane LAMEREa history of the following: Patient Active Problem List   Diagnosis Date Noted  . Seasonal and perennial allergic rhinitis 03/31/2020  . Decreased GFR 12/22/2019  . CRI (chronic renal insufficiency), stage 3 (moderate) 09/26/2019  . Abscess of face 06/13/2017  . Constipation 02/22/2017  . Chest pain, atypical 11/30/2016  . GERD (gastroesophageal reflux disease) 11/30/2016  . Menopause 10/23/2016  . Abscess of finger, left 08/17/2016  . Thoracic back pain 09/29/2015  . Osteoarthritis of both knees 09/29/2015  . Allergic urticaria 03/29/2014  . Grief 03/22/2014  . Urticaria 01/18/2014  . Acute upper respiratory infections of unspecified site 09/02/2013  . Well adult exam 07/02/2012  . Obesity 03/06/2012  . Cervical pain (neck) 12/28/2011  . Neoplasm of uncertain behavior of skin 08/03/2011  . Onychomycosis 08/03/2011  . URI, acute 06/20/2011  . Vitamin B 12 deficiency 05/07/2011  . Hemorrhoid 05/03/2011  . Dyslipidemia 10/31/2010  .  TRIGGER FINGER 03/30/2010  . BRONCHITIS, ACUTE 12/15/2009  . WEIGHT LOSS 12/15/2009  . LOW BACK PAIN 10/25/2009  . HYPERGLYCEMIA 10/25/2009  . Vitamin D deficiency 02/24/2009  . VERTIGO 04/09/2008  . KNEE PAIN 03/30/2008  . ALLERGIC RHINITIS 02/13/2008  . Pain in joint, shoulder region 02/13/2008  . Anxiety state  09/30/2007  . Situational depression 09/30/2007  . EAR PAIN 09/30/2007  . Essential hypertension 09/30/2007    History obtained from: chart review and patient.  Jane Mooney was referred by Plotnikov, Evie Lacks, MD.     Jane Mooney is a 69 y.o. female presenting for an evaluation of hives.   She has had hives for years and over the last 2-3 months they have worsened. She had a particular bad episode on a Thursday in April and she went to see her PCP on Friday and an allergy evaluation was recommended. They are often on her back and cover them. She is off her antihistamines. She takes Benadryl to help with it. When she went to the PCP's office, she was started on two Pepcid daily and Zyrtec at night. This was enough to keep things under control.   She is unsure of the triggers. Around the age 79-14 years, she did an allergy test and had a panel done on her back. She was on shots for a period of time. She has continued to have hayfever symptoms. She denies any problems with food. There are no joint pains or fever. Skin goes back to normal. She denies breathing problems, stomach pain, or throat swelling with any of this.   Prior to 2-3 months ago, she would have hive out breaks every 3-4 months. When she goes outside, it seems to start scratching and itching.    Otherwise, there is no history of other atopic diseases, including asthma, food allergies, drug allergies, stinging insect allergies, eczema or contact dermatitis. There is no significant infectious history. Vaccinations are up to date.    Past Medical History: Patient Active Problem List   Diagnosis Date Noted  . Seasonal and perennial allergic rhinitis 03/31/2020  . Decreased GFR 12/22/2019  . CRI (chronic renal insufficiency), stage 3 (moderate) 09/26/2019  . Abscess of face 06/13/2017  . Constipation 02/22/2017  . Chest pain, atypical 11/30/2016  . GERD (gastroesophageal reflux disease) 11/30/2016  . Menopause 10/23/2016  .  Abscess of finger, left 08/17/2016  . Thoracic back pain 09/29/2015  . Osteoarthritis of both knees 09/29/2015  . Allergic urticaria 03/29/2014  . Grief 03/22/2014  . Urticaria 01/18/2014  . Acute upper respiratory infections of unspecified site 09/02/2013  . Well adult exam 07/02/2012  . Obesity 03/06/2012  . Cervical pain (neck) 12/28/2011  . Neoplasm of uncertain behavior of skin 08/03/2011  . Onychomycosis 08/03/2011  . URI, acute 06/20/2011  . Vitamin B 12 deficiency 05/07/2011  . Hemorrhoid 05/03/2011  . Dyslipidemia 10/31/2010  . TRIGGER FINGER 03/30/2010  . BRONCHITIS, ACUTE 12/15/2009  . WEIGHT LOSS 12/15/2009  . LOW BACK PAIN 10/25/2009  . HYPERGLYCEMIA 10/25/2009  . Vitamin D deficiency 02/24/2009  . VERTIGO 04/09/2008  . KNEE PAIN 03/30/2008  . ALLERGIC RHINITIS 02/13/2008  . Pain in joint, shoulder region 02/13/2008  . Anxiety state 09/30/2007  . Situational depression 09/30/2007  . EAR PAIN 09/30/2007  . Essential hypertension 09/30/2007    Medication List:  Allergies as of 03/31/2020      Reactions   Citalopram    weak   Mirtazapine    Made pt very groggy  Quinapril Hcl    REACTION: cough      Medication List       Accurate as of Mar 31, 2020  2:45 PM. If you have any questions, ask your nurse or doctor.        STOP taking these medications   benzonatate 100 MG capsule Commonly known as: TESSALON Stopped by: Valentina Shaggy, MD   ergocalciferol 1.25 MG (50000 UT) capsule Commonly known as: VITAMIN D2 Stopped by: Valentina Shaggy, MD   losartan 100 MG tablet Commonly known as: COZAAR Stopped by: Valentina Shaggy, MD     TAKE these medications   amLODipine 10 MG tablet Commonly known as: NORVASC TAKE 1 TABLET BY MOUTH EVERY DAY   cetirizine 10 MG tablet Commonly known as: ZYRTEC Take 1 tablet (10 mg total) by mouth 2 (two) times daily. What changed: See the new instructions. Changed by: Valentina Shaggy, MD     cholecalciferol 25 MCG (1000 UNIT) tablet Commonly known as: VITAMIN D Take 2 tablets (2,000 Units total) by mouth daily.   DSS 100 MG Caps docusate sodium 100 mg capsule  TAKE 1 CAPSULE BY MOUTH 2 TIMES A DAY   famotidine 20 MG tablet Commonly known as: PEPCID TAKE 1 TABLET BY MOUTH TWICE A DAY   fish oil-omega-3 fatty acids 1000 MG capsule Take 1 g by mouth daily.   fluticasone 50 MCG/ACT nasal spray Commonly known as: FLONASE Place 1 spray into both nostrils daily. Started by: Valentina Shaggy, MD   GaviLAX 17 GM/SCOOP powder Generic drug: polyethylene glycol powder TAKE 17 G BY MOUTH 2 (TWO) TIMES DAILY AS NEEDED.   LORazepam 1 MG tablet Commonly known as: ATIVAN TAKE 1 TABLET BY MOUTH TWICE A DAY AS NEEDED FOR ANXIETY   telmisartan 80 MG tablet Commonly known as: MICARDIS Take 1 tablet (80 mg total) by mouth daily.   triamcinolone ointment 0.1 % Commonly known as: KENALOG Apply 1 application topically 4 (four) times daily.   triamterene-hydrochlorothiazide 37.5-25 MG tablet Commonly known as: MAXZIDE-25 Take 0.5 tablets by mouth daily.   Vitamin B-12 1000 MCG Subl Place 1 tablet (1,000 mcg total) under the tongue daily.       Birth History: non-contributory  Developmental History: non-contributory  Past Surgical History: Past Surgical History:  Procedure Laterality Date  . ABDOMINAL HYSTERECTOMY  1984  . HEMORRHOID SURGERY    . KNEE ARTHROSCOPY WITH MEDIAL MENISECTOMY Right 05/21/2014   Procedure: RIGHT KNEE ARTHROSCOPY WITH DEBRIDEMENT/SHAVING, MEDIAL MENISECTOMY;  Surgeon: Renette Butters, MD;  Location: [place];  Service: Orthopedics;  Laterality: Right;  . KNEE SURGERY  03/2007   Left Knee Arthr- Dr Onnie Graham  . ROTATOR CUFF REPAIR  2004   Left   . TONSILLECTOMY    . [place]       Family History: Family History  Problem Relation Age of Onset  . Hypertension Mother   . Diabetes Other   . Hypertension Other   . Breast  cancer Other   . Cancer Other        breast   . Colon cancer Neg Hx   . Esophageal cancer Neg Hx   . Stomach cancer Neg Hx   . Rectal cancer Neg Hx      Social History: Jane Mooney lives at home.  She lives in a house that is 71 years old.  There are hardwoods throughout the home.  There are no animals inside or outside of the home.  She  does have dust mite covers on the bed, but not the pillows.  There is no tobacco exposure.  She is not exposed to chemicals or dust.  There is a HEPA filter in the home.  They do not live near an interstate or industrial area. She retired in 2011. She was an Therapist, sports at [employer].    Review of Systems  Constitutional: Negative.  Negative for chills, fever, malaise/fatigue and weight loss.  HENT: Negative.  Negative for congestion, ear discharge and ear pain.   Eyes: Negative for pain, discharge and redness.  Respiratory: Negative for cough, sputum production, shortness of breath and wheezing.   Cardiovascular: Negative.  Negative for chest pain and palpitations.  Gastrointestinal: Negative for abdominal pain, constipation, diarrhea, heartburn, nausea and vomiting.  Skin: Positive for itching and rash.  Neurological: Negative for dizziness and headaches.  Endo/Heme/Allergies: Positive for environmental allergies. Does not bruise/bleed easily.       Objective:   Blood pressure 138/80, pulse 61, temperature (!) 97.1 F (36.2 C), temperature source Temporal, resp. rate 16, height 5' 2.5" (1.588 m), weight 181 lb (82.1 kg), SpO2 100 %. Body mass index is 32.58 kg/m.   Physical Exam:   Physical Exam  Constitutional: She appears well-developed.  Very pleasant female. Cooperative with the exam.   HENT:  Head: Normocephalic and atraumatic.  Right Ear: Tympanic membrane, external ear and ear canal normal. No drainage, swelling or tenderness. Tympanic membrane is not injected, not scarred, not erythematous, not retracted and not bulging.  Left Ear: Tympanic  membrane, external ear and ear canal normal. No drainage, swelling or tenderness. Tympanic membrane is not injected, not scarred, not erythematous, not retracted and not bulging.  Nose: Mucosal edema and rhinorrhea present. No nasal deformity or septal deviation. No epistaxis. Right sinus exhibits no maxillary sinus tenderness and no frontal sinus tenderness. Left sinus exhibits no maxillary sinus tenderness and no frontal sinus tenderness.  Mouth/Throat: Uvula is midline and oropharynx is clear and moist. Mucous membranes are not pale and not dry.  Eyes: Pupils are equal, round, and reactive to light. Conjunctivae and EOM are normal. Right eye exhibits no chemosis and no discharge. Left eye exhibits no chemosis and no discharge. Right conjunctiva is not injected. Left conjunctiva is not injected.  Cardiovascular: Normal rate, regular rhythm and normal heart sounds.  Respiratory: Effort normal and breath sounds normal. No accessory muscle usage. No tachypnea. No respiratory distress. She has no wheezes. She has no rhonchi. She has no rales. She exhibits no tenderness.  Moving air well in all lung fields.  No increased work of breathing.  GI: There is no abdominal tenderness. There is no rebound and no guarding.  Lymphadenopathy:       Head (right side): No submandibular, no tonsillar and no occipital adenopathy present.       Head (left side): No submandibular, no tonsillar and no occipital adenopathy present.    She has no cervical adenopathy.  Neurological: She is alert.  Skin: No abrasion, no petechiae and no rash noted. Rash is not papular, not vesicular and not urticarial. No erythema. No pallor.  Psychiatric: She has a normal mood and affect.     Diagnostic studies:   Allergy Studies:    Airborne Adult Perc - 03/31/20 0942    Time Antigen Placed  5732    Allergen Manufacturer  Lavella Hammock    Location  Back    Number of Test  59    Panel 1  Select  1. Control-Buffer 50% Glycerol  Negative     2. Control-Histamine 1 mg/ml  2+    3. Albumin saline  Negative    4. Elm[place]f  Negative    5. [place]  Negative    6. Johnson  Negative    7. Kentucky Blue  2+    8. Meadow Fescue  3+    9. Perennial Rye  2+    10. Sweet Vernal  Negative    11. Timothy  2+    12. Cocklebur  Negative    13. Burweed Marshelder  Negative    14. Ragweed, short  2+    15. Ragweed, Giant  Negative    16. Plantain,  English  2+    17. Lamb's Quarters  Negative    18. Sheep Sorrell  Negative    19. Rough Pigweed  2+    20. Marsh Elder, Rough  2+    21. Mugwort, Common  Negative    22. Ash mix  Negative    23. Birch mix  Negative    24. Beech American  Negative    25. Box, Elder  2+    26. Cedar, red  2+    27. Cottonwood, Eastern  2+    28. Elm mix  2+    29. Hickory  2+    30. Maple mix  2+    31. Oak, [place] mix  Negative    32. Pecan Pollen  Negative    33. Pine mix  Negative    34. Sycamore Eastern  Negative    35. Bloomfield, Black Pollen  3+    36. Alternaria alternata  Negative    37. Cladosporium Herbarum  Negative    38. Aspergillus mix  Negative    39. Penicillium mix  Negative    40. Bipolaris sorokiniana (Helminthosporium)  Negative    41. Drechslera spicifera (Curvularia)  2+    42. Mucor plumbeus  Negative    43. Fusarium moniliforme  Negative    44. Aureobasidium pullulans (pullulara)  Negative    45. Rhizopus oryzae  2+    46. Botrytis cinera  Negative    47. Epicoccum nigrum  Negative    48. Phoma betae  Negative    49. Candida Albicans  Negative    50. Trichophyton mentagrophytes  Negative    51. Mite, D Farinae  5,000 AU/ml  2+    52. Mite, D Pteronyssinus  5,000 AU/ml  2+    53. Cat Hair 10,000 BAU/ml  2+    54.  Dog Epithelia  Negative    55. Mixed Feathers  2+    56. Horse Epithelia  Negative    57. Cockroach, German  Negative    58. Mouse  Negative    59. Tobacco Leaf  Negative     Food Perc - 03/31/20 0942      Test Information   Time Antigen Placed  0017      Allergen Manufacturer  Lavella Hammock    Location  Back    Number of allergen test  10    Food  Select      Food   1. Peanut  Negative    2. Soybean food  Negative    3. Wheat, whole  Negative    4. Sesame  Negative    5. Milk, cow  Negative    6. Egg White, chicken  Negative    7. Casein  Negative    8. Shellfish  mix  Negative    9. Fish mix  Negative    10. Cashew  Negative     Intradermal - 03/31/20 1007    Time Antigen Placed  1007    Allergen Manufacturer  Lavella Hammock    Location  Arm    Number of Test  7    Intradermal  Select    Control  Negative    Guatem[place]  2+    Johnson  Negative    Mold 1  Negative    Mold 2  Negative    Dog  Negative    Cockroach  Negative       Allergy testing results were read and interpreted by myself, documented by clinical staff.         Salvatore Marvel, MD [facility] [place]

## 2020-04-03 ENCOUNTER — Encounter: Payer: Self-pay | Admitting: Allergy & Immunology

## 2020-04-06 LAB — SEDIMENTATION RATE: Sed Rate: 19 mm/hr (ref 0–40)

## 2020-04-06 LAB — ALPHA-GAL PANEL
Alpha Gal IgE*: <0.1 kU/L (ref <0.10)
Beef (Bos spp) IgE: <0.1 kU/L (ref <0.35)
Class Interpretation: 0
Class Interpretation: 0
Class Interpretation: 0
Lamb/Mutton (Ovis spp) IgE: <0.1 kU/L (ref <0.35)
Pork (Sus spp) IgE: <0.1 kU/L (ref <0.35)

## 2020-04-06 LAB — C-REACTIVE PROTEIN: CRP: <1 mg/L (ref 0–10)

## 2020-04-06 LAB — TRYPTASE: Tryptase: 7 ug/L (ref 2.2–13.2)

## 2020-04-08 ENCOUNTER — Other Ambulatory Visit: Payer: Self-pay | Admitting: Internal Medicine

## 2020-04-10 ENCOUNTER — Other Ambulatory Visit: Payer: Self-pay | Admitting: Family

## 2020-04-12 ENCOUNTER — Encounter: Payer: Self-pay | Admitting: Allergy & Immunology

## 2020-04-20 ENCOUNTER — Encounter: Payer: Self-pay | Admitting: Internal Medicine

## 2020-04-20 ENCOUNTER — Other Ambulatory Visit: Payer: Self-pay

## 2020-04-20 ENCOUNTER — Ambulatory Visit (INDEPENDENT_AMBULATORY_CARE_PROVIDER_SITE_OTHER): Payer: Medicare HMO | Admitting: Internal Medicine

## 2020-04-20 DIAGNOSIS — E538 Deficiency of other specified B group vitamins: Secondary | ICD-10-CM | POA: Diagnosis not present

## 2020-04-20 DIAGNOSIS — E559 Vitamin D deficiency, unspecified: Secondary | ICD-10-CM

## 2020-04-20 DIAGNOSIS — M170 Bilateral primary osteoarthritis of knee: Secondary | ICD-10-CM | POA: Diagnosis not present

## 2020-04-20 DIAGNOSIS — E6609 Other obesity due to excess calories: Secondary | ICD-10-CM | POA: Diagnosis not present

## 2020-04-20 DIAGNOSIS — F411 Generalized anxiety disorder: Secondary | ICD-10-CM | POA: Diagnosis not present

## 2020-04-20 DIAGNOSIS — N183 Chronic kidney disease, stage 3 unspecified: Secondary | ICD-10-CM

## 2020-04-20 DIAGNOSIS — Z6832 Body mass index (BMI) 32.0-32.9, adult: Secondary | ICD-10-CM

## 2020-04-20 LAB — BASIC METABOLIC PANEL
BUN: 16 mg/dL (ref 6–23)
CO2: 30 mEq/L (ref 19–32)
Calcium: 9.6 mg/dL (ref 8.4–10.5)
Chloride: 103 mEq/L (ref 96–112)
Creatinine, Ser: 1.02 mg/dL (ref 0.40–1.20)
GFR: 64.68 mL/min (ref >60.00)
Glucose, Bld: 109 mg/dL — ABNORMAL HIGH (ref 70–99)
Potassium: 4.1 mEq/L (ref 3.5–5.1)
Sodium: 139 mEq/L (ref 135–145)

## 2020-04-20 MED ORDER — TRIAMTERENE-HCTZ 37.5-25 MG PO TABS: 0.5000 | ORAL_TABLET | Freq: Every day | ORAL | 3 refills | Status: DC

## 2020-04-20 MED ORDER — GABAPENTIN 100 MG PO CAPS: 100.0000 mg | ORAL_CAPSULE | Freq: Two times a day (BID) | ORAL | 3 refills | Status: DC | PRN

## 2020-04-20 MED ORDER — AMLODIPINE BESYLATE 5 MG PO TABS
5.0000 mg | ORAL_TABLET | Freq: Every day | ORAL | 3 refills | Status: DC
Start: 2020-04-20 — End: 2020-09-20

## 2020-04-20 MED ORDER — LORAZEPAM 1 MG PO TABS: 1.0000 mg | ORAL_TABLET | Freq: Two times a day (BID) | ORAL | 3 refills | Status: DC | PRN

## 2020-04-20 NOTE — Addendum Note (Signed)
Addended by: Boris Lown B on: 04/20/2020 09:14 AM   Modules accepted: Orders

## 2020-04-20 NOTE — Assessment & Plan Note (Signed)
On B12 

## 2020-04-20 NOTE — Assessment & Plan Note (Signed)
Discussed.

## 2020-04-20 NOTE — Assessment & Plan Note (Signed)
On Vit D 

## 2020-04-20 NOTE — Assessment & Plan Note (Signed)
Lorazepam prn  Potential benefits of a long term benzodiazepines  use as well as potential risks  and complications were explained to the patient and were aknowledged.  

## 2020-04-20 NOTE — Assessment & Plan Note (Signed)
Wt Readings from Last 3 Encounters:  04/20/20 181 lb (82.1 kg)  03/31/20 181 lb (82.1 kg)  03/09/20 183 lb 12.8 oz (83.4 kg)

## 2020-04-20 NOTE — Assessment & Plan Note (Addendum)
Per Dr Georgina Snell - Bilateral knee pain due to DJD.  Steroid injections worked only for about a month.  Not optimistic that repeat steroid injections will be helpful.  We will proceed with trial of hyaluronic acid injections.  We will work on authorizing them now.  Also spent time discussing total knee replacement.  Return to clinic for hyaluronic acid injection series.  Pt declined injections. Try Gabapentin. Pt refused tramadol

## 2020-04-20 NOTE — Progress Notes (Signed)
Subjective:  Patient ID: Jane Mooney, female    DOB: 1950/12/28  Age: 69 y.o. MRN: [id number]  CC: No chief complaint on file.   HPI Jane Mooney presents for B knee OA, B12 def, HTN f/u  Outpatient Medications Prior to Visit  Medication Sig Dispense Refill  . amLODipine (NORVASC) 10 MG tablet TAKE 1 TABLET BY MOUTH EVERY DAY 90 tablet 3  . cetirizine (ZYRTEC) 10 MG tablet Take 1 tablet (10 mg total) by mouth 2 (two) times daily. 30 tablet 3  . cholecalciferol (VITAMIN D) 1000 units tablet Take 2 tablets (2,000 Units total) by mouth daily. 100 tablet 11  . Cyanocobalamin (VITAMIN B-12) 1000 MCG SUBL Place 1 tablet (1,000 mcg total) under the tongue daily. 100 tablet 3  . Docusate Sodium (DSS) 100 MG CAPS docusate sodium 100 mg capsule  TAKE 1 CAPSULE BY MOUTH 2 TIMES A DAY    . famotidine (PEPCID) 20 MG tablet TAKE 1 TABLET BY MOUTH TWICE A DAY 180 tablet 0  . fish oil-omega-3 fatty acids 1000 MG capsule Take 1 g by mouth daily.      . fluticasone (FLONASE) 50 MCG/ACT nasal spray Place 1 spray into both nostrils daily. 16 g 3  . GAVILAX 17 GM/SCOOP powder TAKE 17 G BY MOUTH 2 (TWO) TIMES DAILY AS NEEDED. 510 g 1  . LORazepam (ATIVAN) 1 MG tablet TAKE 1 TABLET BY MOUTH TWICE A DAY AS NEEDED FOR ANXIETY 60 tablet 3  . telmisartan (MICARDIS) 80 MG tablet Take 1 tablet (80 mg total) by mouth daily. Annual appt due in Nov must see provider for future refills 90 tablet 1  . triamcinolone ointment (KENALOG) 0.1 % Apply 1 application topically 4 (four) times daily. 80 g 1  . triamterene-hydrochlorothiazide (MAXZIDE-25) 37.5-25 MG tablet Take 0.5 tablets by mouth daily. 90 tablet 3   Facility-Administered Medications Prior to Visit  Medication Dose Route Frequency Provider Last Rate Last Admin  . methylPREDNISolone acetate (DEPO-MEDROL) injection 40 mg  40 mg Intra-articular Once Camari Wisham V, MD      . methylPREDNISolone acetate (DEPO-MEDROL) injection 80 mg  80 mg Intra-articular  Once Sephora Boyar, Evie Lacks, MD        ROS: Review of Systems  Constitutional: Negative for activity change, appetite change, chills, fatigue and unexpected weight change.  HENT: Negative for congestion, mouth sores and sinus pressure.   Eyes: Negative for visual disturbance.  Respiratory: Negative for cough and chest tightness.   Gastrointestinal: Negative for abdominal pain and nausea.  Genitourinary: Negative for difficulty urinating, frequency and vaginal pain.  Musculoskeletal: Positive for arthralgias and gait problem. Negative for back pain.  Skin: Negative for pallor and rash.  Neurological: Negative for dizziness, tremors, weakness, numbness and headaches.  Psychiatric/Behavioral: Negative for confusion, sleep disturbance and suicidal ideas.    Objective:  Ht 5' 2.5" (1.588 m)   BMI 32.58 kg/m   BP Readings from Last 3 Encounters:  03/31/20 138/80  03/09/20 130/80  02/19/20 120/76    Wt Readings from Last 3 Encounters:  03/31/20 181 lb (82.1 kg)  03/09/20 183 lb 12.8 oz (83.4 kg)  02/19/20 180 lb 3.2 oz (81.7 kg)    Physical Exam Constitutional:      General: She is not in acute distress.    Appearance: She is well-developed.  HENT:     Head: Normocephalic.     Right Ear: External ear normal.     Left Ear: External ear normal.  Nose: Nose normal.  Eyes:     General:        Right eye: No discharge.        Left eye: No discharge.     Conjunctiva/sclera: Conjunctivae normal.     Pupils: Pupils are equal, round, and reactive to light.  Neck:     Thyroid: No thyromegaly.     Vascular: No JVD.     Trachea: No tracheal deviation.  Cardiovascular:     Rate and Rhythm: Normal rate and regular rhythm.     Heart sounds: Normal heart sounds.  Pulmonary:     Effort: No respiratory distress.     Breath sounds: No stridor. No wheezing.  Abdominal:     General: Bowel sounds are normal. There is no distension.     Palpations: Abdomen is soft. There is no mass.      Tenderness: There is no abdominal tenderness. There is no guarding or rebound.  Musculoskeletal:        General: No tenderness.     Cervical back: Normal range of motion and neck supple.  Lymphadenopathy:     Cervical: No cervical adenopathy.  Skin:    Findings: No erythema or rash.  Neurological:     Mental Status: She is oriented to person, place, and time.     Cranial Nerves: No cranial nerve deficit.     Motor: No abnormal muscle tone.     Coordination: Coordination normal.     Gait: Gait abnormal.     Deep Tendon Reflexes: Reflexes normal.  Psychiatric:        Behavior: Behavior normal.        Thought Content: Thought content normal.        Judgment: Judgment normal.    B knees w/pain   Lab Results  Component Value Date   WBC 5.8 09/23/2019   HGB 11.6 (L) 09/23/2019   HCT 35.2 (L) 09/23/2019   PLT 230.0 09/23/2019   GLUCOSE 105 (H) 12/21/2019   CHOL 243 (H) 09/23/2019   TRIG 167.0 (H) 09/23/2019   HDL 40.70 09/23/2019   LDLDIRECT 153.6 07/06/2013   LDLCALC 169 (H) 09/23/2019   ALT 9 09/23/2019   AST 15 09/23/2019   NA 138 12/21/2019   K 4.1 12/21/2019   CL 104 12/21/2019   CREATININE 1.08 12/21/2019   BUN 18 12/21/2019   CO2 28 12/21/2019   TSH 1.71 09/23/2019   HGBA1C 5.8 07/28/2014    US Renal  Result Date: 10/07/2019 CLINICAL DATA:  Chronic kidney disease. EXAM: RENAL / URINARY TRACT ULTRASOUND COMPLETE COMPARISON:  None. FINDINGS: Right Kidney: Renal measurements: 9.3 x 3.7 x 5.6 cm = volume: 100 mL . Echogenicity within normal limits. No mass or hydronephrosis visualized. 3.4 cm simple cyst in the lower pole. Left Kidney: Renal measurements: 10.5 x 6.2 x 5.3 cm = volume: 182 mL. Echogenicity within normal limits. No mass or hydronephrosis visualized. Bladder: Appears normal for degree of bladder distention. Other: None. IMPRESSION: 1. No acute abnormality. 2. 3.4 cm right renal simple cyst. Electronically Signed   By: Titus Dubin M.D.   On:  10/07/2019 14:52    Assessment & Plan:    Follow-up: No follow-ups on file.  [name], MD

## 2020-05-11 ENCOUNTER — Other Ambulatory Visit: Payer: Self-pay | Admitting: Internal Medicine

## 2020-05-11 DIAGNOSIS — H04123 Dry eye syndrome of bilateral lacrimal glands: Secondary | ICD-10-CM | POA: Diagnosis not present

## 2020-05-11 DIAGNOSIS — H25813 Combined forms of age-related cataract, bilateral: Secondary | ICD-10-CM | POA: Diagnosis not present

## 2020-05-11 DIAGNOSIS — M170 Bilateral primary osteoarthritis of knee: Secondary | ICD-10-CM

## 2020-05-11 DIAGNOSIS — H43393 Other vitreous opacities, bilateral: Secondary | ICD-10-CM | POA: Diagnosis not present

## 2020-05-12 ENCOUNTER — Ambulatory Visit: Payer: Medicare HMO | Admitting: Allergy & Immunology

## 2020-05-15 ENCOUNTER — Other Ambulatory Visit: Payer: Self-pay | Admitting: Allergy & Immunology

## 2020-05-23 DIAGNOSIS — M25561 Pain in right knee: Secondary | ICD-10-CM | POA: Diagnosis not present

## 2020-05-23 DIAGNOSIS — M25562 Pain in left knee: Secondary | ICD-10-CM | POA: Diagnosis not present

## 2020-05-27 ENCOUNTER — Telehealth: Payer: Self-pay | Admitting: Internal Medicine

## 2020-05-27 NOTE — Telephone Encounter (Signed)
Recv'd records from [facility] for[facility]ared 2 pages to Dr.Alex Plotnikov 7/16/21fbg

## 2020-06-10 ENCOUNTER — Other Ambulatory Visit: Payer: Self-pay | Admitting: Family

## 2020-06-15 DIAGNOSIS — M25562 Pain in left knee: Secondary | ICD-10-CM | POA: Diagnosis not present

## 2020-06-15 NOTE — H&P (View-Only) (Signed)
KNEE ARTHROPLASTY ADMISSION H&P  Patient ID: Jane Mooney MRN: [id number] DOB/AGE: Sep 30, 1951 69 y.o.  Chief Complaint: left knee pain.  Planned Procedure Date: 07/05/2020 Medical Clearance by Dr. Alain Marion      HPI: Jane Mooney is a 69 y.o. female who presents for evaluation of OA LEFT KNEE. The patient has a history of pain and functional disability in the left knee due to arthritis and has failed non-surgical conservative treatments for greater than 12 weeks to include corticosteriod injections, flexibility and strengthening excercises, weight reduction as appropriate and activity modification.  Onset of symptoms was gradual, starting >10 years ago with gradually worsening course since that time. The patient noted prior procedures on the knee to include  arthroscopy on the left knee.  Patient currently rates pain at 7 out of 10 with activity. Patient has worsening of pain with activity and weight bearing, pain that interferes with activities of daily living and joint swelling.  Patient has evidence of joint space narrowing and medial and patellofemoral severe arthritis of the left knee by imaging studies.  There is no active infection.  Past Medical History:  Diagnosis Date  . Allergic rhinitis   . Anemia   . Anxiety 2010  . Depression   . DJD (degenerative joint disease)    Left knee  . Heart murmur   . Hemorrhoid   . HTN (hypertension)   . Hyperlipidemia   . LBP (low back pain)   . MVP (mitral valve prolapse)   . PONV (postoperative nausea and vomiting)   . Symptomatic PVCs   . Urticaria    recurrent   Past Surgical History:  Procedure Laterality Date  . ABDOMINAL HYSTERECTOMY  1984  . HEMORRHOID SURGERY    . KNEE ARTHROSCOPY WITH MEDIAL MENISECTOMY Right 05/21/2014   Procedure: RIGHT KNEE ARTHROSCOPY WITH DEBRIDEMENT/SHAVING, MEDIAL MENISECTOMY;  Surgeon: Renette Butters, MD;  Location: [place];  Service: Orthopedics;  Laterality: Right;  . KNEE  SURGERY  03/2007   Left Knee Arthr- Dr Onnie Graham  . ROTATOR CUFF REPAIR  2004   Left   . TONSILLECTOMY    . urtica     Allergies  Allergen Reactions  . Citalopram     weak  . Mirtazapine     Made pt very groggy  . Quinapril Hcl Cough   Prior to Admission medications   Medication Sig Start Date End Date Taking? Authorizing Provider  acetaminophen (TYLENOL) 500 MG tablet Take 1,000 mg by mouth every 6 (six) hours as needed for moderate pain or headache.    [provider]  amLODipine (NORVASC) 5 MG tablet Take 1 tablet (5 mg total) by mouth daily. 04/20/20 04/20/21  Plotnikov, Evie Lacks, MD  cetirizine (ZYRTEC) 10 MG tablet TAKE 1 TABLET BY MOUTH TWICE A DAY Patient taking differently: Take 10 mg by mouth 2 (two) times daily.  05/17/20   Valentina Shaggy, MD  cholecalciferol (VITAMIN D) 1000 units tablet Take 2 tablets (2,000 Units total) by mouth daily. Patient taking differently: Take 1,000 Units by mouth daily.  02/07/16   Plotnikov, Evie Lacks, MD  Cyanocobalamin (VITAMIN B-12) 1000 MCG SUBL Place 1 tablet (1,000 mcg total) under the tongue daily. 08/03/11   Plotnikov, Evie Lacks, MD  famotidine (PEPCID) 20 MG tablet TAKE 1 TABLET BY MOUTH TWICE A DAY 06/10/20   Plotnikov, Evie Lacks, MD  fish oil-omega-3 fatty acids 1000 MG capsule Take 1 g by mouth daily.      [provider]  fluticasone (FLONASE) 50 MCG/ACT nasal spray Place 1 spray into both nostrils daily. Patient taking differently: Place 1 spray into both nostrils daily as needed for allergies.  03/31/20   Valentina Shaggy, MD  gabapentin (NEURONTIN) 100 MG capsule Take 1-2 capsules (100-200 mg total) by mouth 2 (two) times daily as needed. Patient not taking: Reported on 06/09/2020 04/20/20   Plotnikov, Evie Lacks, MD  GAVILAX 17 GM/SCOOP powder TAKE 17 G BY MOUTH 2 (TWO) TIMES DAILY AS NEEDED. Patient taking differently: Take 17 g by mouth 2 (two) times daily as needed for moderate constipation.  10/14/19    Plotnikov, Evie Lacks, MD  LORazepam (ATIVAN) 1 MG tablet Take 1 tablet (1 mg total) by mouth 2 (two) times daily as needed for anxiety. 04/20/20   Plotnikov, Evie Lacks, MD  telmisartan (MICARDIS) 80 MG tablet Take 1 tablet (80 mg total) by mouth daily. Annual appt due in Nov must see provider for future refills 04/08/20   Plotnikov, Evie Lacks, MD  triamcinolone ointment (KENALOG) 0.1 % Apply 1 application topically 4 (four) times daily. Patient taking differently: Apply 1 application topically 4 (four) times daily as needed (hemorrhoids).  06/04/19   Plotnikov, Evie Lacks, MD  triamterene-hydrochlorothiazide (MAXZIDE-25) 37.5-25 MG tablet Take 0.5 tablets by mouth daily. 04/20/20   Plotnikov, Evie Lacks, MD  LORazepam (ATIVAN) 1 MG tablet TAKE 1 TABLET BY MOUTH TWICE A DAY AS NEEDED FOR ANXIETY. 07/26/19   Plotnikov, Evie Lacks, MD   Social History   Socioeconomic History  . Marital status: Married    Spouse name: Not on file  . Number of children: 2  . Years of education: Not on file  . Highest education level: Not on file  Occupational History  . 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 Smoking status: Never Smoker  . Smokeless tobacco: Never Used  Vaping Use  . Vaping Use: Never used  Substance and Sexual Activity  . Alcohol use: Yes    Comment: social  . Drug use: No  . Sexual activity: Yes  Other Topics Concern  . Not on file  Social History Narrative  . Not on file   Social Determinants of Health   Financial Resource Strain:   . Difficulty of Paying Living Expenses:   Food Insecurity:   . Worried About Charity fundraiser in the Last Year:   . Arboriculturist in the Last Year:   Transportation Needs:   . Film/video editor (Medical):   [name] Lack of Transportation (Non-Medical):   Physical Activity:   . Days of Exercise per Week:   . Minutes of Exercise per Session:   Stress:   . Feeling of Stress :   Social Connections:   .  Frequency of Communication with Friends and Family:   . Frequency of Social Gatherings with Friends and Family:   . Attends Religious Services:   . Active Member of Clubs or Organizations:   . Attends Archivist Meetings:   [name] Marital Status:    Family History  Problem Relation Age of Onset  . Hypertension Mother   . Diabetes Other   . Hypertension Other   . Breast cancer Other   . Cancer Other        breast   . Colon cancer Neg Hx   . Esophageal cancer Neg Hx   . Stomach cancer Neg Hx   .  Rectal cancer Neg Hx     ROS: Currently denies lightheadedness, dizziness, Fever, chills, CP, SOB.    No personal history of DVT, PE, MI, or CVA. No loose teeth or dentures  All other systems have been reviewed and were otherwise currently negative with the exception of those mentioned in the HPI and as above.  Objective: Vitals: Ht: 63"  Wt: 181 lbs Temp: 98.1 degrees F BP: 123/79 Pulse: 68 O2 100% on room air.   Physical Exam: General: Alert, NAD.  Antalgic Gait, Sitting in exam chair HEENT: EOMI, Good Neck Extension Trachea midline,  Pulm: No increased work of breathing.  Clear B/L A/P w/o crackle or wheeze.  CV: RRR, No rubs/gallobs, +MVP murmur appreciated GI: soft, NT, ND Neuro: Neuro without gross focal deficit.  Sensation intact distally Skin: No lesions in the area of chief complaint MSK/Surgical Site: Left  knee w/o redness or effusion.   JLT. Partial active extension limited by pain.  5/5 strength in extension and flexion.  +EHL/FHL.  NVI.  Stable varus and valgus stress.    Imaging Review Plain radiographs demonstrate severe degenerative joint disease of the left knee.   The overall alignment isneutral. The bone quality appears to be fair for age and reported activity level.  Preoperative templating of the joint replacement has been completed, documented, and submitted to the Operating Room personnel in order to optimize intra-operative equipment  management.  Assessment: OA LEFT KNEE Active Problems:   * No active hospital problems. *   Plan: Plan for Procedure(s): TOTAL KNEE ARTHROPLASTY  The patient history, physical exam, clinical judgement of the provider and imaging are consistent with end stage degenerative joint disease and  joint arthroplasty is deemed medically necessary. The treatment options including medical management, injection therapy, and arthroplasty were discussed at length. The risks and benefits of Procedure(s): TOTAL KNEE ARTHROPLASTY were presented and reviewed.  The risks of nonoperative treatment, versus surgical intervention including but not limited to continued pain, aseptic loosening, stiffness, dislocation/subluxation, infection, bleeding, nerve injury, blood clots, cardiopulmonary complications, morbidity, mortality, among others were discussed. The patient verbalizes understanding and wishes to proceed with the plan.  Patient is being admitted for inpatient treatment for surgery, pain control, PT, prophylactic antibiotics, VTE prophylaxis, progressive ambulation, ADL's and discharge planning.   Dental prophylaxis discussed and recommended for 2 years postoperatively. No cleanings for at least 3 months post-operatively    The patient does meet the criteria for TXA which will be used perioperatively.    ASA 81 mg BID  will be used postoperatively for DVT prophylaxis in addition to SCDs, and early ambulation.  Plan for Tylenol, Vicodin or pain.  NSAID's will be avoided due to her renal disease. Baclofen for spasm. Zofran for nausea. Will plan to add second antiemetic due to her history of post-op n/v  The patient is planning to be discharged home with OPPT in care of her husband   Patient's anticipated LOS is less than 2 midnights, meeting these requirements: - Younger than 78 - Lives within 1 hour of care - Has a competent adult at home to recover with post-op recover - NO history of  - Chronic  pain requiring opiods  - Diabetes  - Coronary Artery Disease  - Heart failure  - Heart attack  - Stroke  - DVT/VTE  - Cardiac arrhythmia  - Respiratory Failure/COPD  - Renal failure  - Anemia  - Advanced Liver disease        Rachael Fee, PA-C  06/15/2020 9:47 AM

## 2020-06-15 NOTE — H&P (Signed)
KNEE ARTHROPLASTY ADMISSION H&P  Patient ID: Jane Mooney MRN: [id number] DOB/AGE: [birth year] 69 y.o.  Chief Complaint: left knee pain.  Planned Procedure Date: 07/05/2020 Medical Clearance by Dr. Alain Marion      HPI: Jane Mooney is a 69 y.o. female who presents for evaluation of OA LEFT KNEE. The patient has a history of pain and functional disability in the left knee due to arthritis and has failed non-surgical conservative treatments for greater than 12 weeks to include corticosteriod injections, flexibility and strengthening excercises, weight reduction as appropriate and activity modification.  Onset of symptoms was gradual, starting >10 years ago with gradually worsening course since that time. The patient noted prior procedures on the knee to include  arthroscopy on the left knee.  Patient currently rates pain at 7 out of 10 with activity. Patient has worsening of pain with activity and weight bearing, pain that interferes with activities of daily living and joint swelling.  Patient has evidence of joint space narrowing and medial and patellofemoral severe arthritis of the left knee by imaging studies.  There is no active infection.  Past Medical History:  Diagnosis Date  . Allergic rhinitis   . Anemia   . Anxiety 2010  . Depression   . DJD (degenerative joint disease)    Left knee  . Heart murmur   . Hemorrhoid   . HTN (hypertension)   . Hyperlipidemia   . LBP (low back pain)   . MVP (mitral valve prolapse)   . PONV (postoperative nausea and vomiting)   . Symptomatic PVCs   . Urticaria    recurrent   Past Surgical History:  Procedure Laterality Date  . ABDOMINAL HYSTERECTOMY  1984  . HEMORRHOID SURGERY    . KNEE ARTHROSCOPY WITH MEDIAL MENISECTOMY Right 05/21/2014   Procedure: RIGHT KNEE ARTHROSCOPY WITH DEBRIDEMENT/SHAVING, MEDIAL MENISECTOMY;  Surgeon: Renette Butters, MD;  Location: [place];  Service: Orthopedics;  Laterality: Right;  . KNEE  SURGERY  03/2007   Left Knee Arthr- Dr Onnie Graham  . ROTATOR CUFF REPAIR  2004   Left   . TONSILLECTOMY    . urtica     Allergies  Allergen Reactions  . Citalopram     weak  . Mirtazapine     Made pt very groggy  . Quinapril Hcl Cough   Prior to Admission medications   Medication Sig Start Date End Date Taking? Authorizing Provider  acetaminophen (TYLENOL) 500 MG tablet Take 1,000 mg by mouth every 6 (six) hours as needed for moderate pain or headache.    [provider]  amLODipine (NORVASC) 5 MG tablet Take 1 tablet (5 mg total) by mouth daily. 04/20/20 04/20/21  Plotnikov, Evie Lacks, MD  cetirizine (ZYRTEC) 10 MG tablet TAKE 1 TABLET BY MOUTH TWICE A DAY Patient taking differently: Take 10 mg by mouth 2 (two) times daily.  05/17/20   Valentina Shaggy, MD  cholecalciferol (VITAMIN D) 1000 units tablet Take 2 tablets (2,000 Units total) by mouth daily. Patient taking differently: Take 1,000 Units by mouth daily.  02/07/16   Plotnikov, Evie Lacks, MD  Cyanocobalamin (VITAMIN B-12) 1000 MCG SUBL Place 1 tablet (1,000 mcg total) under the tongue daily. 08/03/11   Plotnikov, Evie Lacks, MD  famotidine (PEPCID) 20 MG tablet TAKE 1 TABLET BY MOUTH TWICE A DAY 06/10/20   Plotnikov, Evie Lacks, MD  fish oil-omega-3 fatty acids 1000 MG capsule Take 1 g by mouth daily.      [provider]  fluticasone (FLONASE) 50 MCG/ACT nasal spray Place 1 spray into both nostrils daily. Patient taking differently: Place 1 spray into both nostrils daily as needed for allergies.  03/31/20   Valentina Shaggy, MD  gabapentin (NEURONTIN) 100 MG capsule Take 1-2 capsules (100-200 mg total) by mouth 2 (two) times daily as needed. Patient not taking: Reported on 06/09/2020 04/20/20   Plotnikov, Evie Lacks, MD  GAVILAX 17 GM/SCOOP powder TAKE 17 G BY MOUTH 2 (TWO) TIMES DAILY AS NEEDED. Patient taking differently: Take 17 g by mouth 2 (two) times daily as needed for moderate constipation.  10/14/19    Plotnikov, Evie Lacks, MD  LORazepam (ATIVAN) 1 MG tablet Take 1 tablet (1 mg total) by mouth 2 (two) times daily as needed for anxiety. 04/20/20   Plotnikov, Evie Lacks, MD  telmisartan (MICARDIS) 80 MG tablet Take 1 tablet (80 mg total) by mouth daily. Annual appt due in Nov must see provider for future refills 04/08/20   Plotnikov, Evie Lacks, MD  triamcinolone ointment (KENALOG) 0.1 % Apply 1 application topically 4 (four) times daily. Patient taking differently: Apply 1 application topically 4 (four) times daily as needed (hemorrhoids).  06/04/19   Plotnikov, Evie Lacks, MD  triamterene-hydrochlorothiazide (MAXZIDE-25) 37.5-25 MG tablet Take 0.5 tablets by mouth daily. 04/20/20   Plotnikov, Evie Lacks, MD  LORazepam (ATIVAN) 1 MG tablet TAKE 1 TABLET BY MOUTH TWICE A DAY AS NEEDED FOR ANXIETY. 07/26/19   Plotnikov, Evie Lacks, MD   Social History   Socioeconomic History  . Marital status: Married    Spouse name: Not on file  . Number of children: 2  . Years of education: Not on file  . Highest education level: Not on file  Occupational History  . 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 Smoking status: Never Smoker  . Smokeless tobacco: Never Used  Vaping Use  . Vaping Use: Never used  Substance and Sexual Activity  . Alcohol use: Yes    Comment: social  . Drug use: No  . Sexual activity: Yes  Other Topics Concern  . Not on file  Social History Narrative  . Not on file   Social Determinants of Health   Financial Resource Strain:   . Difficulty of Paying Living Expenses:   Food Insecurity:   . Worried About Charity fundraiser in the Last Year:   . Arboriculturist in the Last Year:   Transportation Needs:   . Film/video editor (Medical):   [name] Lack of Transportation (Non-Medical):   Physical Activity:   . Days of Exercise per Week:   . Minutes of Exercise per Session:   Stress:   . Feeling of Stress :   Social Connections:   .  Frequency of Communication with Friends and Family:   . Frequency of Social Gatherings with Friends and Family:   . Attends Religious Services:   . Active Member of Clubs or Organizations:   . Attends Archivist Meetings:   [name] Marital Status:    Family History  Problem Relation Age of Onset  . Hypertension Mother   . Diabetes Other   . Hypertension Other   . Breast cancer Other   . Cancer Other        breast   . Colon cancer Neg Hx   . Esophageal cancer Neg Hx   . Stomach cancer Neg Hx   .  Rectal cancer Neg Hx     ROS: Currently denies lightheadedness, dizziness, Fever, chills, CP, SOB.    No personal history of DVT, PE, MI, or CVA. No loose teeth or dentures  All other systems have been reviewed and were otherwise currently negative with the exception of those mentioned in the HPI and as above.  Objective: Vitals: Ht: 63"  Wt: 181 lbs Temp: 98.1 degrees F BP: 123/79 Pulse: 68 O2 100% on room air.   Physical Exam: General: Alert, NAD.  Antalgic Gait, Sitting in exam chair HEENT: EOMI, Good Neck Extension Trachea midline,  Pulm: No increased work of breathing.  Clear B/L A/P w/o crackle or wheeze.  CV: RRR, No rubs/gallobs, +MVP murmur appreciated GI: soft, NT, ND Neuro: Neuro without gross focal deficit.  Sensation intact distally Skin: No lesions in the area of chief complaint MSK/Surgical Site: Left  knee w/o redness or effusion.   JLT. Partial active extension limited by pain.  5/5 strength in extension and flexion.  +EHL/FHL.  NVI.  Stable varus and valgus stress.    Imaging Review Plain radiographs demonstrate severe degenerative joint disease of the left knee.   The overall alignment isneutral. The bone quality appears to be fair for age and reported activity level.  Preoperative templating of the joint replacement has been completed, documented, and submitted to the Operating Room personnel in order to optimize intra-operative equipment  management.  Assessment: OA LEFT KNEE Active Problems:   * No active hospital problems. *   Plan: Plan for Procedure(s): TOTAL KNEE ARTHROPLASTY  The patient history, physical exam, clinical judgement of the provider and imaging are consistent with end stage degenerative joint disease and  joint arthroplasty is deemed medically necessary. The treatment options including medical management, injection therapy, and arthroplasty were discussed at length. The risks and benefits of Procedure(s): TOTAL KNEE ARTHROPLASTY were presented and reviewed.  The risks of nonoperative treatment, versus surgical intervention including but not limited to continued pain, aseptic loosening, stiffness, dislocation/subluxation, infection, bleeding, nerve injury, blood clots, cardiopulmonary complications, morbidity, mortality, among others were discussed. The patient verbalizes understanding and wishes to proceed with the plan.  Patient is being admitted for inpatient treatment for surgery, pain control, PT, prophylactic antibiotics, VTE prophylaxis, progressive ambulation, ADL's and discharge planning.   Dental prophylaxis discussed and recommended for 2 years postoperatively. No cleanings for at least 3 months post-operatively    The patient does meet the criteria for TXA which will be used perioperatively.    ASA 81 mg BID  will be used postoperatively for DVT prophylaxis in addition to SCDs, and early ambulation.  Plan for Tylenol, Vicodin or pain.  NSAID's will be avoided due to her renal disease. Baclofen for spasm. Zofran for nausea. Will plan to add second antiemetic due to her history of post-op n/v  The patient is planning to be discharged home with OPPT in care of her husband   Patient's anticipated LOS is less than 2 midnights, meeting these requirements: - Younger than 32 - Lives within 1 hour of care - Has a competent adult at home to recover with post-op recover - NO history of  - Chronic  pain requiring opiods  - Diabetes  - Coronary Artery Disease  - Heart failure  - Heart attack  - Stroke  - DVT/VTE  - Cardiac arrhythmia  - Respiratory Failure/COPD  - Renal failure  - Anemia  - Advanced Liver disease        Rachael Fee, PA-C  06/15/2020 9:47 AM

## 2020-06-22 NOTE — Progress Notes (Signed)
DUE TO COVID-19 ONLY ONE VISITOR IS ALLOWED TO COME WITH YOU AND STAY IN THE WAITING ROOM ONLY DURING PRE OP AND PROCEDURE DAY OF SURGERY. THE 1 VISITOR  MAY VISIT WITH YOU AFTER SURGERY IN YOUR PRIVATE ROOM DURING VISITING HOURS ONLY!  YOU NEED TO HAVE A COVID 19 TEST ON__6/20/21 _____ @_______ , THIS TEST MUST BE DONE BEFORE SURGERY,  COVID TESTING SITE [address] [address], IT IS ON THE RIGHT GOING OUT [address] APPROXIMATELY  2 MINUTES PAST [employer] ON THE RIGHT. ONCE YOUR COVID TEST IS COMPLETED,  PLEASE BEGIN THE QUARANTINE INSTRUCTIONS AS OUTLINED IN YOUR HANDOUT.                Jane Mooney  06/22/2020   Your procedure is scheduled on:  07/05/20   Report to [facility] Main  Entrance   Report to admitting at    0530AM     Call this number if you have problems the morning of surgery [phone]    Remember: Do not eat food    :After Midnight. BRUSH YOUR TEETH MORNING OF SURGERY AND RINSE YOUR MOUTH OUT, NO CHEWING GUM CANDY OR MINTS. NO SOLID FOOD AFTER MIDNIGHT THE NIGHT PRIOR TO SURGERY. NOTHING BY MOUTH EXCEPT CLEAR LIQUIDS UNTIL   0430am. PLEASE FINISH ENSURE DRINK PER SURGEON ORDER  WHICH NEEDS TO BE COMPLETED AT 0430am     CLEAR LIQUID DIET   Foods Allowed                                                                   Coffee and tea, regular and decaf                            Plain Jell-O any favor except red or purple                                           a Fruit ices (not with fruit pulp)                                      Iced Popsicles                                     Carbonated beverages, regular and diet                                    Cranberry, grape and apple juices Sports drinks like Gatorade Lightly seasoned clear broth or consume(fat free) Sugar, honey syrup  _____________________________________________________________________      Take these medicines the morning of surgery with A SIP OF WATER:  Amlodipine, Zyrtec, Pepcid Flonase if needed                                 You may not have any metal on your body including hair pins and              piercings  Do not wear jewelry, make-up, lotions, powders or perfumes, deodorant             Do not wear nail polish on your fingernails.  Do not shave  48 hours prior to surgery.              Men may shave face and neck.   Do not bring valuables to the hospital. [place].  Contacts, dentures or bridgework may not be worn into surgery.  Leave suitcase in the car. After surgery it may be brought to your room.     Patients discharged the day of surgery will not be allowed to drive home. IF YOU ARE HAVING SURGERY AND GOING HOME THE SAME DAY, YOU MUST HAVE AN ADULT TO DRIVE YOU HOME AND BE WITH YOU FOR 24 HOURS. YOU MAY GO HOME BY TAXI OR UBER OR ORTHERWISE, BUT AN ADULT MUST ACCOMPANY YOU HOME AND STAY WITH YOU FOR 24 HOURS.  Name and phone number of your driver:               Please read over the following fact sheets you were given: _____________________________________________________________________  [facility] - Preparing for Surgery Before surgery, you can play an important role.  Because skin is not sterile, your skin needs to be as free of germs as possible.  You can reduce the number of germs on your skin by washing with CHG (chlorahexidine gluconate) soap before surgery.  CHG is an antiseptic cleaner which kills germs and bonds with the skin to continue killing germs even after washing. Please DO NOT use if you have an allergy to CHG or antibacterial soaps.  If your skin becomes reddened/irritated stop using the CHG and inform your nurse when you arrive at Short Stay. Do not shave (including legs and underarms) for at least 48 hours  prior to the first CHG shower.  You may shave your face/neck. Please follow these instructions carefully:  1.  Shower with CHG Soap the night before surgery and the  morning of Surgery.  2.  If you choose to wash your hair, wash your hair first as usual with your  normal  shampoo.  3.  After you shampoo, rinse your hair and body thoroughly to remove the  shampoo.                           4.  Use CHG as you would any other liquid soap.  You can apply chg directly  to the skin and wash                       Gently with a scrungie or clean washcloth.  5.  Apply the CHG Soap to your body ONLY FROM THE NECK DOWN.   Do not use on face/ open  Wound or open sores. Avoid contact with eyes, ears mouth and genitals (private parts).                       Wash face,  Genitals (private parts) with your normal soap.             6.  Wash thoroughly, paying special attention to the area where your surgery  will be performed.  7.  Thoroughly rinse your body with warm water from the neck down.  8.  DO NOT shower/wash with your normal soap after using and rinsing off  the CHG Soap.                9.  Pat yourself dry with a clean towel.            10.  Wear clean pajamas.            11.  Place clean sheets on your bed the night of your first shower and do not  sleep with pets. Day of Surgery : Do not apply any lotions/deodorants the morning of surgery.  Please wear clean clothes to the hospital/surgery center.  FAILURE TO FOLLOW THESE INSTRUCTIONS MAY RESULT IN THE CANCELLATION OF YOUR SURGERY PATIENT SIGNATURE_________________________________  NURSE SIGNATURE__________________________________  ________________________________________________________________________   Jane Mooney  An incentive spirometer is a tool that can help keep your lungs clear and active. This tool measures how well you are filling your lungs with each breath. Taking long deep breaths may help reverse  or decrease the chance of developing breathing (pulmonary) problems (especially infection) following:  A long period of time when you are unable to move or be active. BEFORE THE PROCEDURE   If the spirometer includes an indicator to show your best effort, your nurse or respiratory therapist will set it to a desired goal.  If possible, sit up straight or lean slightly forward. Try not to slouch.  Hold the incentive spirometer in an upright position. INSTRUCTIONS FOR USE  1. Sit on the edge of your bed if possible, or sit up as far as you can in bed or on a chair. 2. Hold the incentive spirometer in an upright position. 3. Breathe out normally. 4. Place the mouthpiece in your mouth and seal your lips tightly around it. 5. Breathe in slowly and as deeply as possible, raising the piston or the ball toward the top of the column. 6. Hold your breath for 3-5 seconds or for as long as possible. Allow the piston or ball to fall to the bottom of the column. 7. Remove the mouthpiece from your mouth and breathe out normally. 8. Rest for a few seconds and repeat Steps 1 through 7 at least 10 times every 1-2 hours when you are awake. Take your time and take a few normal breaths between deep breaths. 9. The spirometer may include an indicator to show your best effort. Use the indicator as a goal to work toward during each repetition. 10. After each set of 10 deep breaths, practice coughing to be sure your lungs are clear. If you have an incision (the cut made at the time of surgery), support your incision when coughing by placing a pillow or rolled up towels firmly against it. Once you are able to get out of bed, walk around indoors and cough well. You may stop using the incentive spirometer when instructed by your caregiver.  RISKS AND COMPLICATIONS  Take your time so you do not get  dizzy or light-headed.  If you are in pain, you may need to take or ask for pain medication before doing incentive  spirometry. It is harder to take a deep breath if you are having pain. AFTER USE  Rest and breathe slowly and easily.  It can be helpful to keep track of a log of your progress. Your caregiver can provide you with a simple table to help with this. If you are using the spirometer at home, follow these instructions: [place] IF:   You are having difficultly using the spirometer.  You have trouble using the spirometer as often as instructed.  Your pain medication is not giving enough relief while using the spirometer.  You develop fever of 100.5 F (38.1 C) or higher. SEEK IMMEDIATE MEDICAL CARE IF:   You cough up bloody sputum that had not been present before.  You develop fever of 102 F (38.9 C) or greater.  You develop worsening pain at or near the incision site. MAKE SURE YOU:   Understand these instructions.  Will watch your condition.  Will get help right away if you are not doing well or get worse. Document Released: 03/11/2007 Document Revised: 01/21/2012 Document Reviewed: 05/12/2007 [facility] Patient Information 2014 [place], [place].   ________________________________________________________________________

## 2020-06-23 ENCOUNTER — Other Ambulatory Visit: Payer: Self-pay

## 2020-06-23 ENCOUNTER — Encounter (HOSPITAL_COMMUNITY)
Admission: RE | Admit: 2020-06-23 | Discharge: 2020-06-23 | Disposition: A | Discharge status: Home / Self Care | Payer: Medicare HMO | Source: Ambulatory Visit | Attending: Orthopedic Surgery | Admitting: Orthopedic Surgery

## 2020-06-23 ENCOUNTER — Encounter (HOSPITAL_COMMUNITY): Payer: Self-pay

## 2020-06-23 DIAGNOSIS — I341 Nonrheumatic mitral (valve) prolapse: Secondary | ICD-10-CM | POA: Insufficient documentation

## 2020-06-23 DIAGNOSIS — Z01812 Encounter for preprocedural laboratory examination: Secondary | ICD-10-CM | POA: Diagnosis not present

## 2020-06-23 DIAGNOSIS — Z79899 Other long term (current) drug therapy: Secondary | ICD-10-CM | POA: Insufficient documentation

## 2020-06-23 DIAGNOSIS — F419 Anxiety disorder, unspecified: Secondary | ICD-10-CM | POA: Insufficient documentation

## 2020-06-23 DIAGNOSIS — Z01810 Encounter for preprocedural cardiovascular examination: Secondary | ICD-10-CM | POA: Diagnosis not present

## 2020-06-23 DIAGNOSIS — M1712 Unilateral primary osteoarthritis, left knee: Secondary | ICD-10-CM | POA: Diagnosis not present

## 2020-06-23 DIAGNOSIS — D649 Anemia, unspecified: Secondary | ICD-10-CM | POA: Insufficient documentation

## 2020-06-23 DIAGNOSIS — E785 Hyperlipidemia, unspecified: Secondary | ICD-10-CM | POA: Diagnosis not present

## 2020-06-23 DIAGNOSIS — I110 Hypertensive heart disease with heart failure: Secondary | ICD-10-CM | POA: Insufficient documentation

## 2020-06-23 HISTORY — DX: Chronic kidney disease, unspecified: N18.9

## 2020-06-23 LAB — BASIC METABOLIC PANEL
Anion gap: 7 (ref 5–15)
BUN: 15 mg/dL (ref 8–23)
CO2: 28 mmol/L (ref 22–32)
Calcium: 9.1 mg/dL (ref 8.9–10.3)
Chloride: 103 mmol/L (ref 98–111)
Creatinine, Ser: 1.14 mg/dL — ABNORMAL HIGH (ref 0.44–1.00)
GFR calc Af Amer: 56 mL/min — ABNORMAL LOW (ref >60)
GFR calc non Af Amer: 49 mL/min — ABNORMAL LOW (ref >60)
Glucose, Bld: 119 mg/dL — ABNORMAL HIGH (ref 70–99)
Potassium: 4.2 mmol/L (ref 3.5–5.1)
Sodium: 138 mmol/L (ref 135–145)

## 2020-06-23 LAB — CBC
HCT: 39.9 % (ref 36.0–46.0)
Hemoglobin: 12.5 g/dL (ref 12.0–15.0)
MCH: 26.3 pg (ref 26.0–34.0)
MCHC: 31.3 g/dL (ref 30.0–36.0)
MCV: 84 fL (ref 80.0–100.0)
Platelets: 222 10*3/uL (ref 150–400)
RBC: 4.75 MIL/uL (ref 3.87–5.11)
RDW: 13.9 % (ref 11.5–15.5)
WBC: 4.8 10*3/uL (ref 4.0–10.5)
nRBC: 0 % (ref 0.0–0.2)

## 2020-06-23 LAB — URINALYSIS, ROUTINE W REFLEX MICROSCOPIC
Bilirubin Urine: NEGATIVE
Glucose, UA: NEGATIVE mg/dL
Hgb urine dipstick: NEGATIVE
Ketones, ur: NEGATIVE mg/dL
Leukocytes,Ua: NEGATIVE
Nitrite: NEGATIVE
Protein, ur: NEGATIVE mg/dL
Specific Gravity, Urine: 1.01 (ref 1.005–1.030)
pH: 7 (ref 5.0–8.0)

## 2020-06-23 LAB — PROTIME-INR
INR: 1 (ref 0.8–1.2)
Prothrombin Time: 12.5 seconds (ref 11.4–15.2)

## 2020-06-23 LAB — SURGICAL PCR SCREEN
MRSA, PCR: NEGATIVE
Staphylococcus aureus: NEGATIVE

## 2020-06-23 NOTE — Progress Notes (Signed)
LVMM for Claiborne Billings , Surgery Scheduler to correct consent to left knee instead of right knee.

## 2020-06-24 NOTE — Discharge Instructions (Signed)
INSTRUCTIONS AFTER JOINT REPLACEMENT   o Remove items at home which could result in a fall. This includes throw rugs or furniture in walking pathways o ICE to the affected joint every three hours while awake for 30 minutes at a time, for at least the first 3-5 days, and then as needed for pain and swelling.  Continue to use ice for pain and swelling. You may notice swelling that will progress down to the foot and ankle.  This is normal after surgery.  Elevate your leg when you are not up walking on it.   o Continue to use the breathing machine you got in the hospital (incentive spirometer) which will help keep your temperature down.  It is common for your temperature to cycle up and down following surgery, especially at night when you are not up moving around and exerting yourself.  The breathing machine keeps your lungs expanded and your temperature down.   DIET:  As you were doing prior to hospitalization, we recommend a well-balanced diet.  DRESSING / WOUND CARE / SHOWERING  You may shower 3 days after surgery, but keep the wounds dry during showering.  You may use an occlusive plastic wrap (Press'n Seal for example), NO SOAKING/SUBMERGING IN THE BATHTUB.  If the bandage gets wet, change with a clean dry gauze.  If the incision gets wet, pat the wound dry with a clean towel.  ACTIVITY  o Increase activity slowly as tolerated, but follow the weight bearing instructions below.   o No driving for 6 weeks or until further direction given by your physician.  You cannot drive while taking narcotics.  o No lifting or carrying greater than 10 lbs. until further directed by your surgeon. o Avoid periods of inactivity such as sitting longer than an hour when not asleep. This helps prevent blood clots.  o You may return to work once you are authorized by your doctor.     WEIGHT BEARING   Weight bearing as tolerated with assist device (walker, cane, etc) as directed, use it as long as suggested by  your surgeon or therapist, typically at least 4-6 weeks.   EXERCISES  Results after joint replacement surgery are often greatly improved when you follow the exercise, range of motion and muscle strengthening exercises prescribed by your doctor. Safety measures are also important to protect the joint from further injury. Any time any of these exercises cause you to have increased pain or swelling, decrease what you are doing until you are comfortable again and then slowly increase them. If you have problems or questions, call your caregiver or physical therapist for advice.   Rehabilitation is important following a joint replacement. After just a few days of immobilization, the muscles of the leg can become weakened and shrink (atrophy).  These exercises are designed to build up the tone and strength of the thigh and leg muscles and to improve motion. Often times heat used for twenty to thirty minutes before working out will loosen up your tissues and help with improving the range of motion but do not use heat for the first two weeks following surgery (sometimes heat can increase post-operative swelling).   These exercises can be done on a training (exercise) mat, on the floor, on a table or on a bed. Use whatever works the best and is most comfortable for you.    Use music or television while you are exercising so that the exercises are a pleasant break in your day. This will   make your life better with the exercises acting as a break in your routine that you can look forward to.   Perform all exercises about fifteen times, three times per day or as directed.  You should exercise both the operative leg and the other leg as well.  Exercises include:   . Quad Sets - Tighten up the muscle on the front of the thigh (Quad) and hold for 5-10 seconds.   . Straight Leg Raises - With your knee straight (if you were given a brace, keep it on), lift the leg to 60 degrees, hold for 3 seconds, and slowly lower the  leg.  Perform this exercise against resistance later as your leg gets stronger.  . Leg Slides: Lying on your back, slowly slide your foot toward your buttocks, bending your knee up off the floor (only go as far as is comfortable). Then slowly slide your foot back down until your leg is flat on the floor again.  . Angel Wings: Lying on your back spread your legs to the side as far apart as you can without causing discomfort.  . Hamstring Strength:  Lying on your back, push your heel against the floor with your leg straight by tightening up the muscles of your buttocks.  Repeat, but this time bend your knee to a comfortable angle, and push your heel against the floor.  You may put a pillow under the heel to make it more comfortable if necessary.   A rehabilitation program following joint replacement surgery can speed recovery and prevent re-injury in the future due to weakened muscles. Contact your doctor or a physical therapist for more information on knee rehabilitation.    CONSTIPATION  Constipation is defined medically as fewer than three stools per week and severe constipation as less than one stool per week.  Even if you have a regular bowel pattern at home, your normal regimen is likely to be disrupted due to multiple reasons following surgery.  Combination of anesthesia, postoperative narcotics, change in appetite and fluid intake all can affect your bowels.   YOU MUST use at least one of the following options; they are listed in order of increasing strength to get the job done.  They are all available over the counter, and you may need to use some, POSSIBLY even all of these options:    Drink plenty of fluids (prune juice may be helpful) and high fiber foods Colace 100 mg by mouth twice a day  Senokot for constipation as directed and as needed Dulcolax (bisacodyl), take with full glass of water  Miralax (polyethylene glycol) once or twice a day as needed.  If you have tried all these things  and are unable to have a bowel movement in the first 3-4 days after surgery call either your surgeon or your primary doctor.    If you experience loose stools or diarrhea, hold the medications until you stool forms back up.  If your symptoms do not get better within 1 week or if they get worse, check with your doctor.  If you experience "the worst abdominal pain ever" or develop nausea or vomiting, please contact the office immediately for further recommendations for treatment.   ITCHING:  If you experience itching with your medications, try taking only a single pain pill, or even half a pain pill at a time.  You can also use Benadryl over the counter for itching or also to help with sleep.   TED HOSE STOCKINGS:  Use stockings   on both legs until for at least 2 weeks or as directed by physician office. They may be removed at night for sleeping.  MEDICATIONS:  See your medication summary on the "After Visit Summary" that nursing will review with you.  You may have some home medications which will be placed on hold until you complete the course of blood thinner medication.  It is important for you to complete the blood thinner medication as prescribed.  PRECAUTIONS:  If you experience chest pain or shortness of breath - call 911 immediately for transfer to the hospital emergency department.   If you develop a fever greater that 101 F, purulent drainage from wound, increased redness or drainage from wound, foul odor from the wound/dressing, or calf pain - CONTACT YOUR SURGEON.                                                   FOLLOW-UP APPOINTMENTS:  If you do not already have a post-op appointment, please call the office for an appointment to be seen by your surgeon.  Guidelines for how soon to be seen are listed in your "After Visit Summary", but are typically between 1-4 weeks after surgery.  OTHER INSTRUCTIONS:   Knee Replacement:  Do not place pillow under knee, focus on keeping the knee straight  while resting. CPM instructions: 0-90 degrees, 2 hours in the morning, 2 hours in the afternoon, and 2 hours in the evening. Place foam block, curve side up under heel at all times except when in CPM or when walking.  DO NOT modify, tear, cut, or change the foam block in any way.   DENTAL ANTIBIOTICS:  In most cases prophylactic antibiotics for Dental procdeures after total joint surgery are not necessary.  Exceptions are as follows:  1. History of prior total joint infection  2. Severely immunocompromised (Organ Transplant, cancer chemotherapy, Rheumatoid biologic meds such as Humera)  3. Poorly controlled diabetes (A1C &gt; 8.0, blood glucose over 200)  If you have one of these conditions, contact your surgeon for an antibiotic prescription, prior to your dental procedure.   MAKE SURE YOU:  . Understand these instructions.  . Get help right away if you are not doing well or get worse.    Thank you for letting us be a part of your medical care team.  It is a privilege we respect greatly.  We hope these instructions will help you stay on track for a fast and full recovery!    

## 2020-06-24 NOTE — H&P (Signed)
In error

## 2020-06-30 ENCOUNTER — Other Ambulatory Visit: Payer: Self-pay | Admitting: Allergy & Immunology

## 2020-07-01 ENCOUNTER — Other Ambulatory Visit (HOSPITAL_COMMUNITY)
Admission: RE | Admit: 2020-07-01 | Discharge: 2020-07-01 | Disposition: A | Discharge status: Home / Self Care | Payer: Medicare HMO | Source: Ambulatory Visit | Attending: Orthopedic Surgery | Admitting: Orthopedic Surgery

## 2020-07-01 DIAGNOSIS — Z20822 Contact with and (suspected) exposure to covid-19: Secondary | ICD-10-CM | POA: Diagnosis not present

## 2020-07-01 DIAGNOSIS — Z01812 Encounter for preprocedural laboratory examination: Secondary | ICD-10-CM | POA: Insufficient documentation

## 2020-07-01 LAB — SARS CORONAVIRUS 2 (TAT 6-24 HRS): SARS Coronavirus 2: NEGATIVE

## 2020-07-04 NOTE — Anesthesia Preprocedure Evaluation (Addendum)
Anesthesia Evaluation  Patient identified by MRN, date of birth, ID band Patient awake    Reviewed: Allergy & Precautions, NPO status , Patient's Chart, lab work & pertinent test results  History of Anesthesia Complications (+) PONV and history of anesthetic complications  Airway Mallampati: I       Dental no notable dental hx. (+) Teeth Intact   Pulmonary    Pulmonary exam normal breath sounds clear to auscultation       Cardiovascular hypertension, Pt. on medications Normal cardiovascular exam Rhythm:Regular Rate:Normal     Neuro/Psych    GI/Hepatic Neg liver ROS, GERD  Medicated,  Endo/Other    Renal/GU      Musculoskeletal   Abdominal Normal abdominal exam  (+)   Peds  Hematology   Anesthesia Other Findings   Reproductive/Obstetrics                            Anesthesia Physical Anesthesia Plan  ASA: II  Anesthesia Plan: Spinal   Post-op Pain Management:  Regional for Post-op pain   Induction:   PONV Risk Score and Plan: 3 and Ondansetron and Dexamethasone  Airway Management Planned: Natural Airway and Simple Face Mask  Additional Equipment: None  Intra-op Plan:   Post-operative Plan:   Informed Consent: I have reviewed the patients History and Physical, chart, labs and discussed the procedure including the risks, benefits and alternatives for the proposed anesthesia with the patient or authorized representative who has indicated his/her understanding and acceptance.     Dental advisory given  Plan Discussed with: CRNA  Anesthesia Plan Comments:        Anesthesia Quick Evaluation

## 2020-07-05 ENCOUNTER — Ambulatory Visit (HOSPITAL_COMMUNITY): Payer: Medicare HMO | Admitting: Anesthesiology

## 2020-07-05 ENCOUNTER — Ambulatory Visit (HOSPITAL_COMMUNITY)
Admission: RE | Admit: 2020-07-05 | Discharge: 2020-07-05 | Disposition: A | Discharge status: Home / Self Care | Payer: Medicare HMO | Attending: Orthopedic Surgery | Admitting: Orthopedic Surgery

## 2020-07-05 ENCOUNTER — Encounter (HOSPITAL_COMMUNITY): Payer: Self-pay | Admitting: Orthopedic Surgery

## 2020-07-05 ENCOUNTER — Encounter (HOSPITAL_COMMUNITY)
Admission: RE | Disposition: A | Discharge status: Home / Self Care | Payer: Self-pay | Source: Home / Self Care | Attending: Orthopedic Surgery

## 2020-07-05 ENCOUNTER — Ambulatory Visit (HOSPITAL_COMMUNITY): Payer: Medicare HMO

## 2020-07-05 DIAGNOSIS — Z79899 Other long term (current) drug therapy: Secondary | ICD-10-CM | POA: Diagnosis not present

## 2020-07-05 DIAGNOSIS — F329 Major depressive disorder, single episode, unspecified: Secondary | ICD-10-CM | POA: Insufficient documentation

## 2020-07-05 DIAGNOSIS — Z888 Allergy status to other drugs, medicaments and biological substances status: Secondary | ICD-10-CM | POA: Insufficient documentation

## 2020-07-05 DIAGNOSIS — E785 Hyperlipidemia, unspecified: Secondary | ICD-10-CM | POA: Insufficient documentation

## 2020-07-05 DIAGNOSIS — M545 Low back pain: Secondary | ICD-10-CM | POA: Diagnosis not present

## 2020-07-05 DIAGNOSIS — M1712 Unilateral primary osteoarthritis, left knee: Secondary | ICD-10-CM | POA: Diagnosis not present

## 2020-07-05 DIAGNOSIS — Z8249 Family history of ischemic heart disease and other diseases of the circulatory system: Secondary | ICD-10-CM | POA: Diagnosis not present

## 2020-07-05 DIAGNOSIS — R011 Cardiac murmur, unspecified: Secondary | ICD-10-CM | POA: Insufficient documentation

## 2020-07-05 DIAGNOSIS — K219 Gastro-esophageal reflux disease without esophagitis: Secondary | ICD-10-CM | POA: Insufficient documentation

## 2020-07-05 DIAGNOSIS — D649 Anemia, unspecified: Secondary | ICD-10-CM | POA: Diagnosis not present

## 2020-07-05 DIAGNOSIS — Z96652 Presence of left artificial knee joint: Secondary | ICD-10-CM | POA: Diagnosis not present

## 2020-07-05 DIAGNOSIS — Z833 Family history of diabetes mellitus: Secondary | ICD-10-CM | POA: Insufficient documentation

## 2020-07-05 DIAGNOSIS — F419 Anxiety disorder, unspecified: Secondary | ICD-10-CM | POA: Diagnosis not present

## 2020-07-05 DIAGNOSIS — Z90710 Acquired absence of both cervix and uterus: Secondary | ICD-10-CM | POA: Diagnosis not present

## 2020-07-05 DIAGNOSIS — Z803 Family history of malignant neoplasm of breast: Secondary | ICD-10-CM | POA: Insufficient documentation

## 2020-07-05 DIAGNOSIS — I493 Ventricular premature depolarization: Secondary | ICD-10-CM | POA: Diagnosis not present

## 2020-07-05 DIAGNOSIS — I341 Nonrheumatic mitral (valve) prolapse: Secondary | ICD-10-CM | POA: Insufficient documentation

## 2020-07-05 DIAGNOSIS — I10 Essential (primary) hypertension: Secondary | ICD-10-CM | POA: Insufficient documentation

## 2020-07-05 DIAGNOSIS — Z471 Aftercare following joint replacement surgery: Secondary | ICD-10-CM | POA: Diagnosis not present

## 2020-07-05 DIAGNOSIS — G8918 Other acute postprocedural pain: Secondary | ICD-10-CM | POA: Diagnosis not present

## 2020-07-05 HISTORY — PX: TOTAL KNEE ARTHROPLASTY: SHX125

## 2020-07-05 SURGERY — ARTHROPLASTY, KNEE, TOTAL
Anesthesia: Spinal | Site: Knee | Laterality: Left

## 2020-07-05 MED ORDER — PROPOFOL 10 MG/ML IV BOLUS
INTRAVENOUS | Status: DC | PRN
  Administered 2020-07-05: 40 mg via INTRAVENOUS

## 2020-07-05 MED ORDER — ACETAMINOPHEN 160 MG/5ML PO SOLN: 325.0000 mg | ORAL | Status: DC | PRN

## 2020-07-05 MED ORDER — PHENYLEPHRINE HCL-NACL 10-0.9 MG/250ML-% IV SOLN
INTRAVENOUS | Status: DC | PRN
  Administered 2020-07-05: 15 ug/min via INTRAVENOUS

## 2020-07-05 MED ORDER — ONDANSETRON HCL 4 MG PO TABS: 4.0000 mg | ORAL_TABLET | Freq: Every day | ORAL | 1 refills | Status: AC | PRN

## 2020-07-05 MED ORDER — PHENYLEPHRINE HCL (PRESSORS) 10 MG/ML IV SOLN
INTRAVENOUS | Status: AC
  Filled 2020-07-05: qty 1

## 2020-07-05 MED ORDER — SODIUM CHLORIDE 0.9% FLUSH
INTRAVENOUS | Status: DC | PRN
  Administered 2020-07-05: 30 mL

## 2020-07-05 MED ORDER — ACETAMINOPHEN 10 MG/ML IV SOLN: 1000.0000 mg | Freq: Once | INTRAVENOUS | Status: DC | PRN

## 2020-07-05 MED ORDER — MIDAZOLAM HCL 2 MG/2ML IJ SOLN
INTRAMUSCULAR | Status: AC
  Filled 2020-07-05: qty 2

## 2020-07-05 MED ORDER — PROPOFOL 1000 MG/100ML IV EMUL
INTRAVENOUS | Status: AC
  Filled 2020-07-05: qty 100

## 2020-07-05 MED ORDER — CLONIDINE HCL (ANALGESIA) 100 MCG/ML EP SOLN
EPIDURAL | Status: DC | PRN
  Administered 2020-07-05: 100 ug

## 2020-07-05 MED ORDER — TRANEXAMIC ACID-NACL 1000-0.7 MG/100ML-% IV SOLN
1000.0000 mg | INTRAVENOUS | Status: AC
  Administered 2020-07-05: 1000 mg via INTRAVENOUS

## 2020-07-05 MED ORDER — MEPIVACAINE HCL (PF) 2 % IJ SOLN
INTRAMUSCULAR | Status: AC
  Filled 2020-07-05: qty 20

## 2020-07-05 MED ORDER — CEFAZOLIN SODIUM-DEXTROSE 2-4 GM/100ML-% IV SOLN
2.0000 g | Freq: Four times a day (QID) | INTRAVENOUS | Status: DC
  Administered 2020-07-05: 2 g via INTRAVENOUS

## 2020-07-05 MED ORDER — FENTANYL CITRATE (PF) 100 MCG/2ML IJ SOLN
INTRAMUSCULAR | Status: AC
  Filled 2020-07-05: qty 2

## 2020-07-05 MED ORDER — OXYCODONE HCL 5 MG/5ML PO SOLN: 5.0000 mg | Freq: Once | ORAL | Status: DC | PRN

## 2020-07-05 MED ORDER — FENTANYL CITRATE (PF) 100 MCG/2ML IJ SOLN
INTRAMUSCULAR | Status: DC | PRN
Start: 2020-07-05 — End: 2020-07-05
  Administered 2020-07-05: 100 ug via INTRAVENOUS

## 2020-07-05 MED ORDER — CEFAZOLIN SODIUM-DEXTROSE 2-4 GM/100ML-% IV SOLN
INTRAVENOUS | Status: AC
  Filled 2020-07-05: qty 100

## 2020-07-05 MED ORDER — LACTATED RINGERS IV SOLN: INTRAVENOUS | Status: DC

## 2020-07-05 MED ORDER — HYDROCODONE-ACETAMINOPHEN 5-325 MG PO TABS
1.0000 | ORAL_TABLET | Freq: Four times a day (QID) | ORAL | 0 refills | Status: AC | PRN
Start: 2020-07-05 — End: 2020-07-10

## 2020-07-05 MED ORDER — OXYCODONE HCL 5 MG PO TABS: 5.0000 mg | ORAL_TABLET | Freq: Once | ORAL | Status: DC | PRN

## 2020-07-05 MED ORDER — TRANEXAMIC ACID-NACL 1000-0.7 MG/100ML-% IV SOLN
INTRAVENOUS | Status: AC
  Filled 2020-07-05: qty 100

## 2020-07-05 MED ORDER — MIDAZOLAM HCL 5 MG/5ML IJ SOLN
INTRAMUSCULAR | Status: DC | PRN
  Administered 2020-07-05: 2 mg via INTRAVENOUS

## 2020-07-05 MED ORDER — BUPIVACAINE LIPOSOME 1.3 % IJ SUSP
20.0000 mL | Freq: Once | INTRAMUSCULAR | Status: AC
  Administered 2020-07-05: 20 mL
  Filled 2020-07-05: qty 20

## 2020-07-05 MED ORDER — SODIUM CHLORIDE 0.9 % IR SOLN
Status: DC | PRN
  Administered 2020-07-05: 1000 mL

## 2020-07-05 MED ORDER — DEXAMETHASONE SODIUM PHOSPHATE 10 MG/ML IJ SOLN
INTRAMUSCULAR | Status: AC
  Filled 2020-07-05: qty 1

## 2020-07-05 MED ORDER — ONDANSETRON HCL 4 MG/2ML IJ SOLN
INTRAMUSCULAR | Status: DC | PRN
  Administered 2020-07-05: 4 mg via INTRAVENOUS

## 2020-07-05 MED ORDER — CHLORHEXIDINE GLUCONATE 0.12 % MT SOLN
15.0000 mL | Freq: Once | OROMUCOSAL | Status: AC
  Administered 2020-07-05: 15 mL via OROMUCOSAL

## 2020-07-05 MED ORDER — SODIUM CHLORIDE (PF) 0.9 % IJ SOLN
INTRAMUSCULAR | Status: AC
  Filled 2020-07-05: qty 50

## 2020-07-05 MED ORDER — FENTANYL CITRATE (PF) 100 MCG/2ML IJ SOLN
25.0000 ug | INTRAMUSCULAR | Status: DC | PRN
  Administered 2020-07-05: 50 ug via INTRAVENOUS

## 2020-07-05 MED ORDER — CELECOXIB 200 MG PO CAPS: 200.0000 mg | ORAL_CAPSULE | Freq: Two times a day (BID) | ORAL | 2 refills | Status: AC

## 2020-07-05 MED ORDER — ACETAMINOPHEN 325 MG PO TABS: 325.0000 mg | ORAL_TABLET | ORAL | Status: DC | PRN

## 2020-07-05 MED ORDER — MEPIVACAINE HCL (PF) 2 % IJ SOLN
INTRAMUSCULAR | Status: DC | PRN
  Administered 2020-07-05: 3 mL via INTRATHECAL

## 2020-07-05 MED ORDER — STERILE WATER FOR IRRIGATION IR SOLN
Status: DC | PRN
  Administered 2020-07-05 (×2): 1000 mL

## 2020-07-05 MED ORDER — KETOROLAC TROMETHAMINE 15 MG/ML IJ SOLN: 15.0000 mg | Freq: Once | INTRAMUSCULAR | Status: DC

## 2020-07-05 MED ORDER — ROPIVACAINE HCL 7.5 MG/ML IJ SOLN
INTRAMUSCULAR | Status: DC | PRN
  Administered 2020-07-05 (×4): 5 mL via PERINEURAL

## 2020-07-05 MED ORDER — LACTATED RINGERS IV BOLUS
500.0000 mL | Freq: Once | INTRAVENOUS | Status: AC
  Administered 2020-07-05: 500 mL via INTRAVENOUS

## 2020-07-05 MED ORDER — DEXAMETHASONE SODIUM PHOSPHATE 10 MG/ML IJ SOLN
INTRAMUSCULAR | Status: DC | PRN
  Administered 2020-07-05: 10 mg via INTRAVENOUS

## 2020-07-05 MED ORDER — ONDANSETRON HCL 4 MG/2ML IJ SOLN
INTRAMUSCULAR | Status: AC
  Filled 2020-07-05: qty 2

## 2020-07-05 MED ORDER — ORAL CARE MOUTH RINSE: 15.0000 mL | Freq: Once | OROMUCOSAL | Status: AC

## 2020-07-05 MED ORDER — CEFAZOLIN SODIUM-DEXTROSE 2-4 GM/100ML-% IV SOLN
2.0000 g | INTRAVENOUS | Status: AC
  Administered 2020-07-05: 2 g via INTRAVENOUS

## 2020-07-05 MED ORDER — ASPIRIN EC 81 MG PO TBEC: 81.0000 mg | DELAYED_RELEASE_TABLET | Freq: Two times a day (BID) | ORAL | 2 refills | Status: AC

## 2020-07-05 MED ORDER — PROPOFOL 500 MG/50ML IV EMUL
INTRAVENOUS | Status: AC
  Filled 2020-07-05: qty 50

## 2020-07-05 MED ORDER — PROPOFOL 10 MG/ML IV BOLUS
INTRAVENOUS | Status: AC
  Filled 2020-07-05: qty 20

## 2020-07-05 MED ORDER — PROMETHAZINE HCL 25 MG/ML IJ SOLN: 6.2500 mg | INTRAMUSCULAR | Status: DC | PRN

## 2020-07-05 MED ORDER — MEPERIDINE HCL 50 MG/ML IJ SOLN: 6.2500 mg | INTRAMUSCULAR | Status: DC | PRN

## 2020-07-05 MED ORDER — 0.9 % SODIUM CHLORIDE (POUR BTL) OPTIME
TOPICAL | Status: DC | PRN
  Administered 2020-07-05: 1000 mL

## 2020-07-05 MED ORDER — ROPIVACAINE HCL 5 MG/ML IJ SOLN
INTRAMUSCULAR | Status: DC | PRN
  Administered 2020-07-05 (×2): 5 mL via PERINEURAL

## 2020-07-05 MED ORDER — LACTATED RINGERS IV BOLUS
250.0000 mL | Freq: Once | INTRAVENOUS | Status: AC
  Administered 2020-07-05: 250 mL via INTRAVENOUS

## 2020-07-05 MED ORDER — PROPOFOL 500 MG/50ML IV EMUL
INTRAVENOUS | Status: DC | PRN
  Administered 2020-07-05: 125 ug/kg/min via INTRAVENOUS

## 2020-07-05 MED ORDER — BACLOFEN 10 MG PO TABS: 10.0000 mg | ORAL_TABLET | Freq: Two times a day (BID) | ORAL | 1 refills | Status: AC

## 2020-07-05 SURGICAL SUPPLY — 56 items
BLADE HEX COATED 2.75 (ELECTRODE) ×3 IMPLANT
BLADE SAG 18X100X1.27 (BLADE) ×3 IMPLANT
BLADE SAGITTAL 25.0X1.37X90 (BLADE) ×2 IMPLANT
BLADE SAGITTAL 25.0X1.37X90MM (BLADE) ×1
BLADE SURG 15 STRL LF DISP TIS (BLADE) ×1 IMPLANT
BLADE SURG 15 STRL SS (BLADE) ×3
BLADE SURG SZ10 CARB STEEL (BLADE) ×6 IMPLANT
BNDG CMPR MED 10X6 ELC LF (GAUZE/BANDAGES/DRESSINGS) ×1
BNDG ELASTIC 6X10 VLCR STRL LF (GAUZE/BANDAGES/DRESSINGS) ×3 IMPLANT
BOWL SMART MIX CTS (DISPOSABLE) IMPLANT
BSPLAT TIB 3 KN TRITANIUM (Knees) ×1 IMPLANT
CLOSURE STERI-STRIP 1/2X4 (GAUZE/BANDAGES/DRESSINGS) ×1
CLSR STERI-STRIP ANTIMIC 1/2X4 (GAUZE/BANDAGES/DRESSINGS) ×2 IMPLANT
COMP FEMORAL TRIATHLON SZ3 (Joint) ×3 IMPLANT
COMPONENT FEMRL TRIATHLON SZ3 (Joint) IMPLANT
COVER SURGICAL LIGHT HANDLE (MISCELLANEOUS) ×3 IMPLANT
COVER WAND RF STERILE (DRAPES) IMPLANT
CUFF TOURN SGL QUICK 34 (TOURNIQUET CUFF) ×3
CUFF TRNQT CYL 34X4.125X (TOURNIQUET CUFF) ×1 IMPLANT
DECANTER SPIKE VIAL GLASS SM (MISCELLANEOUS) ×3 IMPLANT
DRAPE U-SHAPE 47X51 STRL (DRAPES) ×3 IMPLANT
DRSG MEPILEX BORDER 4X12 (GAUZE/BANDAGES/DRESSINGS) ×3 IMPLANT
DURAPREP 26ML APPLICATOR (WOUND CARE) ×6 IMPLANT
GLOVE BIO SURGEON STRL SZ7.5 (GLOVE) ×6 IMPLANT
GLOVE BIOGEL PI IND STRL 7.5 (GLOVE) ×1 IMPLANT
GLOVE BIOGEL PI IND STRL 8 (GLOVE) ×1 IMPLANT
GLOVE BIOGEL PI INDICATOR 7.5 (GLOVE) ×2
GLOVE BIOGEL PI INDICATOR 8 (GLOVE) ×2
GOWN STRL REUS W/ TWL LRG LVL3 (GOWN DISPOSABLE) ×2 IMPLANT
GOWN STRL REUS W/TWL LRG LVL3 (GOWN DISPOSABLE) ×6
HANDPIECE INTERPULSE COAX TIP (DISPOSABLE) ×3
HOLDER FOLEY CATH W/STRAP (MISCELLANEOUS) IMPLANT
IMMOBILIZER KNEE 20 (SOFTGOODS) ×3 IMPLANT
IMMOBILIZER KNEE 20 THIGH 36 (SOFTGOODS) IMPLANT
IMMOBILIZER KNEE 22 UNIV (SOFTGOODS) ×3 IMPLANT
INSERT TIB CS TRIATH X3 9 (Insert) ×2 IMPLANT
KIT TURNOVER KIT A (KITS) IMPLANT
KNEE PATELLA ASYMMETRIC 9X29 (Knees) ×2 IMPLANT
KNEE TIBIAL COMPONENT SZ3 (Knees) ×2 IMPLANT
MANIFOLD NEPTUNE II (INSTRUMENTS) ×3 IMPLANT
NS IRRIG 1000ML POUR BTL (IV SOLUTION) ×3 IMPLANT
PACK ICE MAXI GEL EZY WRAP (MISCELLANEOUS) ×3 IMPLANT
PACK TOTAL KNEE CUSTOM (KITS) ×3 IMPLANT
PENCIL SMOKE EVACUATOR (MISCELLANEOUS) IMPLANT
PIN FLUTED HEDLESS FIX 3.5X1/8 (PIN) ×2 IMPLANT
PROTECTOR NERVE ULNAR (MISCELLANEOUS) ×3 IMPLANT
SET HNDPC FAN SPRY TIP SCT (DISPOSABLE) ×1 IMPLANT
SUT MNCRL AB 4-0 PS2 18 (SUTURE) ×3 IMPLANT
SUT VIC AB 0 CT1 36 (SUTURE) ×3 IMPLANT
SUT VIC AB 1 CT1 36 (SUTURE) ×6 IMPLANT
SUT VIC AB 2-0 CT1 27 (SUTURE) ×3
SUT VIC AB 2-0 CT1 TAPERPNT 27 (SUTURE) ×1 IMPLANT
TRAY FOLEY MTR SLVR 14FR STAT (SET/KITS/TRAYS/PACK) ×2 IMPLANT
TRAY FOLEY MTR SLVR 16FR STAT (SET/KITS/TRAYS/PACK) IMPLANT
WRAP KNEE MAXI GEL POST OP (GAUZE/BANDAGES/DRESSINGS) ×2 IMPLANT
YANKAUER SUCT BULB TIP 10FT TU (MISCELLANEOUS) ×3 IMPLANT

## 2020-07-05 NOTE — Anesthesia Procedure Notes (Addendum)
Anesthesia Regional Block: Adductor canal block   Pre-Anesthetic Checklist: ,, timeout performed, Correct Patient, Correct Site, Correct Laterality, Correct Procedure, Correct Position, site marked, Risks and benefits discussed,  Surgical consent,  Pre-op evaluation,  At surgeon's request and post-op pain management  Laterality: Lower and Left  Prep: chloraprep       Needles:  Injection technique: Single-shot  Needle Type: Echogenic Stimulator Needle     Needle Length: 9cm  Needle Gauge: 20   Needle insertion depth: 2.5 cm   Additional Needles:   Procedures:,,,, ultrasound used (permanent image in chart),,,,  Narrative:  Start time: 07/05/2020 7:00 AM End time: 07/05/2020 7:10 AM Injection made incrementally with aspirations every 5 mL. Anesthesiologist: Lyn Hollingshead, MD

## 2020-07-05 NOTE — Transfer of Care (Signed)
Immediate Anesthesia Transfer of Care Note  Patient: Jane Mooney  Procedure(s) Performed: TOTAL KNEE ARTHROPLASTY (Left Knee)  Patient Location: PACU  Anesthesia Type:Spinal  Level of Consciousness: awake, alert  and oriented  Airway & Oxygen Therapy: Patient Spontanous Breathing and Patient connected to face mask oxygen  Post-op Assessment: Report given to RN and Post -op Vital signs reviewed and stable  Post vital signs: Reviewed and stable  Last Vitals:  Vitals Value Taken Time  BP 103/54 07/05/20 0949  Temp    Pulse 61 07/05/20 0951  Resp 17 07/05/20 0951  SpO2 100 % 07/05/20 0951  Vitals shown include unvalidated device data.  Last Pain:  Vitals:   07/05/20 0554  TempSrc: Oral  PainSc: 0-No pain         Complications: No complications documented.

## 2020-07-05 NOTE — Anesthesia Postprocedure Evaluation (Signed)
Anesthesia Post Note  Patient: ANAVI BRANSCUM  Procedure(s) Performed: TOTAL KNEE ARTHROPLASTY (Left Knee)     Patient location during evaluation: Phase II Anesthesia Type: Spinal Level of consciousness: awake Pain management: pain level controlled Vital Signs Assessment: post-procedure vital signs reviewed and stable Respiratory status: spontaneous breathing Cardiovascular status: stable Postop Assessment: no headache, no backache, spinal receding, patient able to bend at knees and no apparent nausea or vomiting Anesthetic complications: no   No complications documented.  Last Vitals:  Vitals:   07/05/20 1100 07/05/20 1118  BP: (!) 140/94 138/80  Pulse: 68   Resp: 13 16  Temp:  36.4 C  SpO2: 94% 94%    Last Pain:  Vitals:   07/05/20 1118  TempSrc: Oral  PainSc:                  Huston Foley

## 2020-07-05 NOTE — Anesthesia Procedure Notes (Signed)
Spinal  Patient location during procedure: OR Start time: 07/05/2020 7:18 AM End time: 07/05/2020 7:22 AM Staffing Performed: anesthesiologist  Anesthesiologist: Lyn Hollingshead, MD Preanesthetic Checklist Completed: patient identified, IV checked, site marked, risks and benefits discussed, surgical consent, monitors and equipment checked, pre-op evaluation and timeout performed Spinal Block Patient position: sitting Prep: DuraPrep and site prepped and draped Patient monitoring: continuous pulse ox and blood pressure Approach: midline Location: L3-4 Injection technique: single-shot Needle Needle type: Pencan  Needle gauge: 24 G Needle length: 10 cm Needle insertion depth: 7 cm Assessment Sensory level: T8

## 2020-07-05 NOTE — Evaluation (Signed)
Physical Therapy Evaluation Patient Details Name: Jane Mooney MRN: [id number] DOB: Oct 16, 1951 Today's Date: 07/05/2020   History of Present Illness  Patient is 69 y.o. female s/p Lt TKA on 07/05/20 with PMH significant for low back pain, HTN, HLD, CKD, anemia, depression, anxiety, Lt RCR.    Clinical Impression  Jane Mooney is a 69 y.o. female POD 0 s/p Lt TKA. Patient reports independence with [facility] for mobility over the last few months due to pain. Patient is now limited by functional impairments (see PT problem list below) and requires min guard to supervision for transfers and gait with RW. Patient was able to ambulate ~130 feet with RW and min assist initially progressing to supervision and cues for safe walker management. Patient educated on safe sequencing for stair mobility and verbalized safe guarding position for people assisting with mobility. Patient's husband present at EOS for gait and stair training and demonstrated safe guarding position. Patient instructed in exercises to facilitate ROM and circulation. Time spent educating pt/family on safe use of knee immobilizer and criteria to discontinue use. Educated on need to perform SLR with knee fully extended to be able to discontinue use of immobilizer for gait and transfers. Patient will benefit from continued skilled PT interventions to address impairments and progress towards PLOF. Patient has met mobility goals at adequate level for discharge home; will continue to follow if pt continues acute stay to progress towards Mod I goals.     Follow Up Recommendations Follow surgeon's recommendation for DC plan and follow-up therapies;Outpatient PT    Equipment Recommendations  None recommended by PT    Recommendations for Other Services       Precautions / Restrictions Precautions Precautions: Fall Restrictions Weight Bearing Restrictions: No Other Position/Activity Restrictions: WBAT      Mobility  Bed Mobility Overal bed  mobility: Needs Assistance Bed Mobility: Supine to Sit     Supine to sit: Min guard;Supervision     General bed mobility comments: pt requires increased time, no assist requried. cues to use belt or Rt LE to assist Lt with raising leg off/on bed.   Transfers Overall transfer level: Needs assistance Equipment used: Rolling walker (2 wheeled) Transfers: Sit to/from Stand Sit to Stand: Min guard;Supervision         General transfer comment: min guard from EOB for safety. cues for technique with RW. supervision from toilet with use of grab bars.  Ambulation/Gait Ambulation/Gait assistance: Supervision;Min guard;Min assist Gait Distance (Feet): 130 Feet Assistive device: Rolling walker (2 wheeled) Gait Pattern/deviations: Step-to pattern;Decreased stride length;Decreased stance time - left;Decreased weight shift to left Gait velocity: decr   General Gait Details: min assist initially to steady and prevent LOB due to Lt LE weakness. pt able to progress to min guard and use UE's on RW to reduce weightbearing in Lt LE to prevent buckling. pt also wearing immobilizer for stability. pt's husband present for end of session and educated on safe guarding technique with gait.  Stairs Stairs: Yes Stairs assistance: Min assist;Min guard Stair Management: Two rails;Step to pattern;Forwards Number of Stairs: 6 (2x2) General stair comments: patient educated on safe step pattern for stairs with 2 rails. Pt required min assist on first bout and min guard on second and third. Pt's husband present for 3rd bout and provided safe guarding technique. Educated on safe strategies of placing a chair at bottom and top of stairs to provide a seated rest place for pt.   Wheelchair Mobility    Modified Rankin (  Stroke Patients Only)       Balance Overall balance assessment: Needs assistance Sitting-balance support: Feet supported Sitting balance-Leahy Scale: Good     Standing balance support: During  functional activity;Bilateral upper extremity supported Standing balance-Leahy Scale: Fair              Pertinent Vitals/Pain Pain Assessment: No/denies pain    Home Living Family/patient expects to be discharged to:: Private residence Living Arrangements: Spouse/significant other Available Help at Discharge: Family Type of Home: House Home Access: Stairs to enter Entrance Stairs-Rails: Can reach both Entrance Stairs-Number of Steps: 6 Home Layout: Two level;Bed/bath upstairs;1/2 bath on main level Home Equipment: Walker - 2 wheels;Toilet riser;Cane - single point      Prior Function Level of Independence: Independent with assistive device(s)         Comments: using SPC for last couple months due to pain     Hand Dominance   Dominant Hand: Right    Extremity/Trunk Assessment   Upper Extremity Assessment Upper Extremity Assessment: Overall WFL for tasks assessed    Lower Extremity Assessment Lower Extremity Assessment: LLE deficits/detail LLE Deficits / Details: pt with no quad activation. unable to complete quad set or SLR. knee immobilizer required for safety/stability. LLE: Unable to fully assess due to immobilization LLE Sensation: WNL LLE Coordination: decreased gross motor    Cervical / Trunk Assessment Cervical / Trunk Assessment: Normal  Communication   Communication: No difficulties  Cognition Arousal/Alertness: Awake/alert Behavior During Therapy: WFL for tasks assessed/performed Overall Cognitive Status: Within Functional Limits for tasks assessed                 General Comments      Exercises Total Joint Exercises Ankle Circles/Pumps: Both;20 reps;AROM;Supine Quad Sets: AROM;Other reps (comment);Supine;Right (2) Short Arc Quad: AROM;Right;Other reps (comment);Supine (2) Heel Slides: AROM;Other reps (comment);Supine;Right (2)   Assessment/Plan    PT Assessment Patient needs continued PT services  PT Problem List Decreased  strength;Decreased range of motion;Decreased activity tolerance;Decreased balance;Decreased mobility;Decreased knowledge of use of DME;Decreased knowledge of precautions       PT Treatment Interventions DME instruction;Gait training;Stair training;Functional mobility training;Therapeutic activities;Therapeutic exercise;Balance training;Patient/family education    PT Goals (Current goals can be found in the Care Plan section)  Acute Rehab PT Goals Patient Stated Goal: get home safely PT Goal Formulation: With patient Time For Goal Achievement: 07/12/20 Potential to Achieve Goals: Good    Frequency 7X/week   Barriers to discharge           AM-PAC PT "6 Clicks" Mobility  Outcome Measure Help needed turning from your back to your side while in a flat bed without using bedrails?: None Help needed moving from lying on your back to sitting on the side of a flat bed without using bedrails?: A Little Help needed moving to and from a bed to a chair (including a wheelchair)?: A Little Help needed standing up from a chair using your arms (e.g., wheelchair or bedside chair)?: A Little Help needed to walk in hospital room?: A Little Help needed climbing 3-5 steps with a railing? : A Little 6 Click Score: 19    End of Session Equipment Utilized During Treatment: Gait belt;Left knee immobilizer Activity Tolerance: Patient tolerated treatment well Patient left: in bed;with call bell/phone within reach;with family/visitor present Nurse Communication: Mobility status;Other (comment) (must have immobilizer on when going home) PT Visit Diagnosis: Difficulty in walking, not elsewhere classified (R26.2);Muscle weakness (generalized) (M62.81)    Time: 0347-4259 PT Time  Calculation (min) (ACUTE ONLY): 67 min   Charges:   PT Evaluation $PT Eval Low Complexity: 1 Low PT Treatments $Gait Training: 23-37 mins $Therapeutic Exercise: 8-22 mins        Verner Mould, DPT Acute Rehabilitation  Services  Office [phone] Pager [phone]  07/05/2020 3:11 PM

## 2020-07-05 NOTE — Op Note (Signed)
DATE OF SURGERY:  07/05/2020 TIME: 8:47 AM  PATIENT NAME:  Jane Mooney   AGE: 69 y.o.    PRE-OPERATIVE DIAGNOSIS:  OA LEFT KNEE  POST-OPERATIVE DIAGNOSIS:  Same  PROCEDURE:  Procedure(s): TOTAL KNEE ARTHROPLASTY   SURGEON:  Renette Butters, MD   ASSISTANT:  Margy Clarks, PA-C, he was present and scrubbed throughout the case, critical for completion in a timely fashion, and for retraction, instrumentation, and closure.    OPERATIVE IMPLANTS: [employer]ker Triathlon CR. Press fit knee  Femur size 3, Tibia size 3, Patella size 29 3-peg oval button, with a 9 mm polyethylene insert.   PREOPERATIVE INDICATIONS:  Jane Mooney is a 69 y.o. year old female with end stage bone on bone degenerative arthritis of the knee who failed conservative treatment, including injections, antiinflammatories, activity modification, and assistive devices, and had significant impairment of their activities of daily living, and elected for Total Knee Arthroplasty.   The risks, benefits, and alternatives were discussed at length including but not limited to the risks of infection, bleeding, nerve injury, stiffness, blood clots, the need for revision surgery, cardiopulmonary complications, among others, and they were willing to proceed.   OPERATIVE DESCRIPTION:  The patient was brought to the operative room and placed in a supine position.  General anesthesia was administered.  IV antibiotics were given.  The lower extremity was prepped and draped in the usual sterile fashion.  Time out was performed.  The leg was elevated and exsanguinated and the tourniquet was inflated.  Anterior approach was performed.  The patella was everted and osteophytes were removed.  The anterior horn of the medial and lateral meniscus was removed.   The distal femur was opened with the drill and the intramedullary distal femoral cutting jig was utilized, set at 5 degrees resecting 9 mm off the distal femur.  Care was taken to  protect the collateral ligaments.  The distal femoral sizing jig was applied, taking care to avoid notching.  Then the 4-in-1 cutting jig was applied and the anterior and posterior femur was cut, along with the chamfer cuts.  All posterior osteophytes were removed.  The flexion gap was then measured and was symmetric with the extension gap.  Then the extramedullary tibial cutting jig was utilized making the appropriate cut using the anterior tibial crest as a reference building in appropriate posterior slope.  Care was taken during the cut to protect the medial and collateral ligaments.  The proximal tibia was removed along with the posterior horns of the menisci.  The PCL was sacrificed.    The extensor gap was measured and was approximately 15mm.    I completed the distal femoral preparation using the appropriate jig to prepare the box.  The patella was then measured, and cut with the saw.    The proximal tibia sized and prepared accordingly with the reamer and the punch, and then all components were trialed with the above sized poly insert.  The knee was found to have excellent balance and full motion.    The above named components were then impacted into place and Poly tibial piece and patella were inserted.  I was very happy with his stability and ROM  I performed a periarticular injection with marcaine and toradol  The knee was easily taken through a range of motion and the patella tracked well and the knee irrigated copiously and the parapatellar and subcutaneous tissue closed with vicryl, and monocryl with steri strips for the skin.  The  incision was dressed with sterile gauze and the tourniquet released and the patient was awakened and returned to the PACU in stable and satisfactory condition.  There were no complications.  Total tourniquet time was roughly 65 minutes.   POSTOPERATIVE PLAN: post op Abx, DVT px: SCD's, TED's, Early ambulation and chemical px

## 2020-07-05 NOTE — Interval H&P Note (Signed)
History and Physical Interval Note:  07/05/2020 6:40 AM  Jane Mooney  has presented today for surgery, with the diagnosis of OA LEFT KNEE.  The various methods of treatment have been discussed with the patient and family. After consideration of risks, benefits and other options for treatment, the patient has consented to  Procedure(s): TOTAL KNEE ARTHROPLASTY (Left) as a surgical intervention.  The patient's history has been reviewed, patient examined, no change in status, stable for surgery.  I have reviewed the patient's chart and labs.  Questions were answered to the patient's satisfaction.     Renette Butters

## 2020-07-06 ENCOUNTER — Encounter (HOSPITAL_COMMUNITY): Payer: Self-pay | Admitting: Orthopedic Surgery

## 2020-07-07 DIAGNOSIS — M25662 Stiffness of left knee, not elsewhere classified: Secondary | ICD-10-CM | POA: Diagnosis not present

## 2020-07-07 DIAGNOSIS — M25462 Effusion, left knee: Secondary | ICD-10-CM | POA: Diagnosis not present

## 2020-07-07 DIAGNOSIS — Z96652 Presence of left artificial knee joint: Secondary | ICD-10-CM | POA: Diagnosis not present

## 2020-07-07 DIAGNOSIS — M1712 Unilateral primary osteoarthritis, left knee: Secondary | ICD-10-CM | POA: Diagnosis not present

## 2020-07-07 DIAGNOSIS — M6281 Muscle weakness (generalized): Secondary | ICD-10-CM | POA: Diagnosis not present

## 2020-07-07 DIAGNOSIS — R262 Difficulty in walking, not elsewhere classified: Secondary | ICD-10-CM | POA: Diagnosis not present

## 2020-07-13 DIAGNOSIS — R262 Difficulty in walking, not elsewhere classified: Secondary | ICD-10-CM | POA: Diagnosis not present

## 2020-07-13 DIAGNOSIS — M25662 Stiffness of left knee, not elsewhere classified: Secondary | ICD-10-CM | POA: Diagnosis not present

## 2020-07-13 DIAGNOSIS — M25462 Effusion, left knee: Secondary | ICD-10-CM | POA: Diagnosis not present

## 2020-07-13 DIAGNOSIS — M6281 Muscle weakness (generalized): Secondary | ICD-10-CM | POA: Diagnosis not present

## 2020-07-15 DIAGNOSIS — M6281 Muscle weakness (generalized): Secondary | ICD-10-CM | POA: Diagnosis not present

## 2020-07-15 DIAGNOSIS — M25662 Stiffness of left knee, not elsewhere classified: Secondary | ICD-10-CM | POA: Diagnosis not present

## 2020-07-15 DIAGNOSIS — R262 Difficulty in walking, not elsewhere classified: Secondary | ICD-10-CM | POA: Diagnosis not present

## 2020-07-15 DIAGNOSIS — M25462 Effusion, left knee: Secondary | ICD-10-CM | POA: Diagnosis not present

## 2020-07-21 ENCOUNTER — Ambulatory Visit: Payer: Medicare HMO | Admitting: Internal Medicine

## 2020-07-21 DIAGNOSIS — M25662 Stiffness of left knee, not elsewhere classified: Secondary | ICD-10-CM | POA: Diagnosis not present

## 2020-07-21 DIAGNOSIS — M25462 Effusion, left knee: Secondary | ICD-10-CM | POA: Diagnosis not present

## 2020-07-21 DIAGNOSIS — M6281 Muscle weakness (generalized): Secondary | ICD-10-CM | POA: Diagnosis not present

## 2020-07-21 DIAGNOSIS — R262 Difficulty in walking, not elsewhere classified: Secondary | ICD-10-CM | POA: Diagnosis not present

## 2020-07-22 DIAGNOSIS — M6281 Muscle weakness (generalized): Secondary | ICD-10-CM | POA: Diagnosis not present

## 2020-07-22 DIAGNOSIS — M25462 Effusion, left knee: Secondary | ICD-10-CM | POA: Diagnosis not present

## 2020-07-22 DIAGNOSIS — M25662 Stiffness of left knee, not elsewhere classified: Secondary | ICD-10-CM | POA: Diagnosis not present

## 2020-07-22 DIAGNOSIS — R262 Difficulty in walking, not elsewhere classified: Secondary | ICD-10-CM | POA: Diagnosis not present

## 2020-07-26 DIAGNOSIS — R262 Difficulty in walking, not elsewhere classified: Secondary | ICD-10-CM | POA: Diagnosis not present

## 2020-07-26 DIAGNOSIS — M25462 Effusion, left knee: Secondary | ICD-10-CM | POA: Diagnosis not present

## 2020-07-26 DIAGNOSIS — M25662 Stiffness of left knee, not elsewhere classified: Secondary | ICD-10-CM | POA: Diagnosis not present

## 2020-07-26 DIAGNOSIS — M6281 Muscle weakness (generalized): Secondary | ICD-10-CM | POA: Diagnosis not present

## 2020-07-28 DIAGNOSIS — R262 Difficulty in walking, not elsewhere classified: Secondary | ICD-10-CM | POA: Diagnosis not present

## 2020-07-28 DIAGNOSIS — M25462 Effusion, left knee: Secondary | ICD-10-CM | POA: Diagnosis not present

## 2020-07-28 DIAGNOSIS — M6281 Muscle weakness (generalized): Secondary | ICD-10-CM | POA: Diagnosis not present

## 2020-07-28 DIAGNOSIS — M25662 Stiffness of left knee, not elsewhere classified: Secondary | ICD-10-CM | POA: Diagnosis not present

## 2020-08-03 DIAGNOSIS — R262 Difficulty in walking, not elsewhere classified: Secondary | ICD-10-CM | POA: Diagnosis not present

## 2020-08-03 DIAGNOSIS — M25662 Stiffness of left knee, not elsewhere classified: Secondary | ICD-10-CM | POA: Diagnosis not present

## 2020-08-03 DIAGNOSIS — M25462 Effusion, left knee: Secondary | ICD-10-CM | POA: Diagnosis not present

## 2020-08-03 DIAGNOSIS — M6281 Muscle weakness (generalized): Secondary | ICD-10-CM | POA: Diagnosis not present

## 2020-08-05 DIAGNOSIS — M25662 Stiffness of left knee, not elsewhere classified: Secondary | ICD-10-CM | POA: Diagnosis not present

## 2020-08-05 DIAGNOSIS — M6281 Muscle weakness (generalized): Secondary | ICD-10-CM | POA: Diagnosis not present

## 2020-08-05 DIAGNOSIS — R262 Difficulty in walking, not elsewhere classified: Secondary | ICD-10-CM | POA: Diagnosis not present

## 2020-08-05 DIAGNOSIS — M25462 Effusion, left knee: Secondary | ICD-10-CM | POA: Diagnosis not present

## 2020-08-12 DIAGNOSIS — M6281 Muscle weakness (generalized): Secondary | ICD-10-CM | POA: Diagnosis not present

## 2020-08-12 DIAGNOSIS — R262 Difficulty in walking, not elsewhere classified: Secondary | ICD-10-CM | POA: Diagnosis not present

## 2020-08-12 DIAGNOSIS — M25662 Stiffness of left knee, not elsewhere classified: Secondary | ICD-10-CM | POA: Diagnosis not present

## 2020-08-12 DIAGNOSIS — M25462 Effusion, left knee: Secondary | ICD-10-CM | POA: Diagnosis not present

## 2020-08-17 DIAGNOSIS — R262 Difficulty in walking, not elsewhere classified: Secondary | ICD-10-CM | POA: Diagnosis not present

## 2020-08-17 DIAGNOSIS — M25662 Stiffness of left knee, not elsewhere classified: Secondary | ICD-10-CM | POA: Diagnosis not present

## 2020-08-17 DIAGNOSIS — M6281 Muscle weakness (generalized): Secondary | ICD-10-CM | POA: Diagnosis not present

## 2020-08-17 DIAGNOSIS — M25462 Effusion, left knee: Secondary | ICD-10-CM | POA: Diagnosis not present

## 2020-08-18 ENCOUNTER — Encounter: Payer: Self-pay | Admitting: Allergy & Immunology

## 2020-08-18 ENCOUNTER — Other Ambulatory Visit: Payer: Self-pay

## 2020-08-18 ENCOUNTER — Ambulatory Visit (INDEPENDENT_AMBULATORY_CARE_PROVIDER_SITE_OTHER): Payer: Medicare HMO | Admitting: Allergy & Immunology

## 2020-08-18 VITALS — BP 110/80 | HR 75 | Temp 97.9°F

## 2020-08-18 DIAGNOSIS — J302 Other seasonal allergic rhinitis: Secondary | ICD-10-CM

## 2020-08-18 DIAGNOSIS — J3089 Other allergic rhinitis: Secondary | ICD-10-CM

## 2020-08-18 DIAGNOSIS — L500 Allergic urticaria: Secondary | ICD-10-CM

## 2020-08-18 NOTE — Patient Instructions (Addendum)
1. Seasonal and perennial allergic rhinitis (grasses, ragweed, weeds, trees, indoor molds, outdoor molds, dust mites and cat) - Continue with: Zyrtec (cetirizine) 10mg  but increase to TWO TIMES daily and Flonase (fluticasone) one spray per nostril daily - I think we can avoid allergy shots for now.   2. Allergic urticaria - Previous lab workup was fairly normal. - Let's drop the famotidine to see how you do.   - Morning: Zyrtec (cetirizine) 10mg  (one tablet)   - Evening: Zyrtec (cetirizine) 10mg  (one tablet) - You can change this dosing at home, decreasing the dose as needed or increasing the dosing as needed.   3. Return in about 4 months (around 12/19/2020).    Please inform us of any Emergency Department visits, hospitalizations, or changes in symptoms. Call us before going to the ED for breathing or allergy symptoms since we might be able to fit you in for a sick visit. Feel free to contact us anytime with any questions, problems, or concerns.  It was a pleasure to see you again today!  Websites that have reliable patient information: 1. American Academy of Asthma, Allergy, and Immunology: www.aaaai.org 2. Food Allergy Research and Education (FARE): foodallergy.org 3. Mothers of Asthmatics: http://www.asthmacommunitynetwork.org 4. American College of Allergy, Asthma, and Immunology: www.acaai.org   COVID-19 Vaccine Information can be found at: ShippingScam.co.uk For questions related to vaccine distribution or appointments, please email vaccine@[email or web address] [email or web address] or call [phone].     "Like" [place] on Facebook and Instagram for our latest updates!     HAPPY FALL!     Make sure you are registered to vote! If you have moved or changed any of your contact information, you will need to get this updated before voting!  In some cases, you MAY be able to register to vote online:  [email or web address]

## 2020-08-18 NOTE — Progress Notes (Signed)
FOLLOW UP  Date of Service/Encounter:  08/18/20   Assessment:   Seasonal and perennial allergic rhinitis (grasses, ragweed, weeds, trees, indoor molds, outdoor molds, dust mites and cat)  Allergic urticaria  Plan/Recommendations:   1. Seasonal and perennial allergic rhinitis (grasses, ragweed, weeds, trees, indoor molds, outdoor molds, dust mites and cat) - Continue with: Zyrtec (cetirizine) 10mg  but increase to TWO TIMES daily and Flonase (fluticasone) one spray per nostril daily - I think we can avoid allergy shots for now.   2. Allergic urticaria - Previous lab workup was fairly normal. - Let's drop the famotidine to see how you do.   - Morning: Zyrtec (cetirizine) 10mg  (one tablet)   - Evening: Zyrtec (cetirizine) 10mg  (one tablet) - You can change this dosing at home, decreasing the dose as needed or increasing the dosing as needed.   3. Return in about 4 months (around 12/19/2020).    Subjective:   Jane Mooney is a 69 y.o. female presenting today for follow up of  Chief Complaint  Patient presents with  . Follow-up  . Allergic Rhinitis   . Urticaria    no more hives but still itching     Jane Mooney has a history of the following: Patient Active Problem List   Diagnosis Date Noted  . Seasonal and perennial allergic rhinitis 03/31/2020  . Decreased GFR 12/22/2019  . CRI (chronic renal insufficiency), stage 3 (moderate) ([place]) 09/26/2019  . Abscess of face 06/13/2017  . Constipation 02/22/2017  . Chest pain, atypical 11/30/2016  . GERD (gastroesophageal reflux disease) 11/30/2016  . Menopause 10/23/2016  . Abscess of finger, left 08/17/2016  . Thoracic back pain 09/29/2015  . Osteoarthritis of both knees 09/29/2015  . Allergic urticaria 03/29/2014  . Grief 03/22/2014  . Urticaria 01/18/2014  . Acute upper respiratory infections of unspecified site 09/02/2013  . Well adult exam 07/02/2012  . Obesity 03/06/2012  . Cervical pain (neck) 12/28/2011  .  Neoplasm of uncertain behavior of skin 08/03/2011  . Onychomycosis 08/03/2011  . URI, acute 06/20/2011  . Vitamin B 12 deficiency 05/07/2011  . Hemorrhoid 05/03/2011  . Dyslipidemia 10/31/2010  . TRIGGER FINGER 03/30/2010  . BRONCHITIS, ACUTE 12/15/2009  . WEIGHT LOSS 12/15/2009  . LOW BACK PAIN 10/25/2009  . HYPERGLYCEMIA 10/25/2009  . Vitamin D deficiency 02/24/2009  . VERTIGO 04/09/2008  . KNEE PAIN 03/30/2008  . ALLERGIC RHINITIS 02/13/2008  . Pain in joint, shoulder region 02/13/2008  . Anxiety state 09/30/2007  . Situational depression 09/30/2007  . EAR PAIN 09/30/2007  . Essential hypertension 09/30/2007    History obtained from: chart review and patient.  Jane Mooney is a 69 y.o. female presenting for a follow up visit.  She was last seen in May 2021 as a new patient.  At that time, she had testing that was positive to grasses, ragweed, trees, weeds, indoor and outdoor molds, dust mite, and cat.  We continued her Zyrtec but increased to twice daily.  We started her on Flonase 1 spray per nostril daily.  She was endorsing urticaria as well, which is why we doubled her Zyrtec.  We also started her on Pepcid twice a day.  Since the last visit, she has done well. She has not hives. She does report that that she developed a bump on right ear. Examination shows a likely folliculitis.  She never had any throat swelling with her hives.  She does not have an EpiPen in place.  None  Her allergic rhinitis well.  She is not using any nasal sprays on a routine basis.  She has not been on allergy shots.  She does not think that she needs now.  She does need her COVID-19 booster shot.  She has not received it yet.  She is fully vaccinated otherwise.  Otherwise, there have been no changes to her past medical history, surgical history, family history, or social history.    Review of Systems  Constitutional: Negative.  Negative for chills, fever, malaise/fatigue and weight loss.  HENT: Negative  for congestion, ear discharge, ear pain and sinus pain.   Eyes: Negative for pain, discharge and redness.  Respiratory: Negative for cough, sputum production, shortness of breath and wheezing.   Cardiovascular: Negative.  Negative for chest pain and palpitations.  Gastrointestinal: Negative for abdominal pain, constipation, diarrhea, heartburn, nausea and vomiting.  Skin: Negative.  Negative for itching and rash.  Neurological: Negative for dizziness and headaches.  Endo/Heme/Allergies: Negative for environmental allergies. Does not bruise/bleed easily.       Objective:   Blood pressure 110/80, pulse 75, temperature 97.9 F (36.6 C), temperature source Temporal, SpO2 100 %. There is no height or weight on file to calculate BMI.   Physical Exam:  Physical Exam Constitutional:      Appearance: She is well-developed.     Comments: Pleasant female.  Cooperative with exam.  HENT:     Head: Normocephalic and atraumatic.     Right Ear: Tympanic membrane, ear canal and external ear normal.     Left Ear: Tympanic membrane, ear canal and external ear normal.     Nose: No nasal deformity, septal deviation, mucosal edema or rhinorrhea.     Right Turbinates: Enlarged and swollen.     Left Turbinates: Enlarged and swollen.     Right Sinus: No maxillary sinus tenderness or frontal sinus tenderness.     Left Sinus: No maxillary sinus tenderness or frontal sinus tenderness.     Mouth/Throat:     Mouth: Mucous membranes are not pale and not dry.     Pharynx: Uvula midline.  Eyes:     General:        Right eye: No discharge.        Left eye: No discharge.     Conjunctiva/sclera: Conjunctivae normal.     Right eye: Right conjunctiva is not injected. No chemosis.    Left eye: Left conjunctiva is not injected. No chemosis.    Pupils: Pupils are equal, round, and reactive to light.  Cardiovascular:     Rate and Rhythm: Normal rate and regular rhythm.     Heart sounds: Normal heart sounds.    Pulmonary:     Effort: Pulmonary effort is normal. No tachypnea, accessory muscle usage or respiratory distress.     Breath sounds: No decreased breath sounds, wheezing, rhonchi or rales.     Comments: Moving air well in all lung fields. Chest:     Chest wall: No tenderness.  Lymphadenopathy:     Cervical: No cervical adenopathy.  Skin:    Coloration: Skin is not pale.     Findings: No abrasion, erythema, petechiae or rash. Rash is not papular, urticarial or vesicular.     Comments: Small area of folliculitis noted on the right mastoid process.  Neurological:     Mental Status: She is alert.  Psychiatric:        Behavior: Behavior is cooperative.      Diagnostic studies: none    Salvatore Marvel, MD  [facility]

## 2020-08-24 DIAGNOSIS — M25662 Stiffness of left knee, not elsewhere classified: Secondary | ICD-10-CM | POA: Diagnosis not present

## 2020-08-24 DIAGNOSIS — M25462 Effusion, left knee: Secondary | ICD-10-CM | POA: Diagnosis not present

## 2020-08-24 DIAGNOSIS — M6281 Muscle weakness (generalized): Secondary | ICD-10-CM | POA: Diagnosis not present

## 2020-08-24 DIAGNOSIS — R262 Difficulty in walking, not elsewhere classified: Secondary | ICD-10-CM | POA: Diagnosis not present

## 2020-08-27 ENCOUNTER — Ambulatory Visit: Payer: Medicare HMO | Attending: Internal Medicine

## 2020-08-27 ENCOUNTER — Other Ambulatory Visit: Payer: Self-pay

## 2020-08-27 DIAGNOSIS — Z23 Encounter for immunization: Secondary | ICD-10-CM

## 2020-08-27 NOTE — Progress Notes (Signed)
   Covid-19 Vaccination Clinic  Name:  Jane Mooney    MRN: [id number] DOB: 1951/09/05  08/27/2020  [name] was observed post Covid-19 immunization for 15 minutes without incident. She was provided with Vaccine Information Sheet and instruction to access the V-Safe system.   [name] was instructed to call 911 with any severe reactions post vaccine: [name] Difficulty breathing  . Swelling of face and throat  . A fast heartbeat  . A bad rash all over body  . Dizziness and weakness

## 2020-08-29 DIAGNOSIS — M25462 Effusion, left knee: Secondary | ICD-10-CM | POA: Diagnosis not present

## 2020-09-12 ENCOUNTER — Other Ambulatory Visit: Payer: Self-pay | Admitting: Internal Medicine

## 2020-09-14 DIAGNOSIS — R262 Difficulty in walking, not elsewhere classified: Secondary | ICD-10-CM | POA: Diagnosis not present

## 2020-09-14 DIAGNOSIS — M25462 Effusion, left knee: Secondary | ICD-10-CM | POA: Diagnosis not present

## 2020-09-14 DIAGNOSIS — M25662 Stiffness of left knee, not elsewhere classified: Secondary | ICD-10-CM | POA: Diagnosis not present

## 2020-09-14 DIAGNOSIS — M6281 Muscle weakness (generalized): Secondary | ICD-10-CM | POA: Diagnosis not present

## 2020-09-20 ENCOUNTER — Ambulatory Visit (INDEPENDENT_AMBULATORY_CARE_PROVIDER_SITE_OTHER): Payer: Medicare HMO

## 2020-09-20 ENCOUNTER — Encounter: Payer: Self-pay | Admitting: Internal Medicine

## 2020-09-20 ENCOUNTER — Ambulatory Visit (INDEPENDENT_AMBULATORY_CARE_PROVIDER_SITE_OTHER): Payer: Medicare HMO | Admitting: Internal Medicine

## 2020-09-20 ENCOUNTER — Other Ambulatory Visit: Payer: Self-pay

## 2020-09-20 VITALS — BP 118/82 | HR 80 | Temp 98.5°F | Ht 63.0 in | Wt 171.5 lb

## 2020-09-20 VITALS — BP 118/82 | HR 80 | Temp 98.5°F | Wt 171.6 lb

## 2020-09-20 DIAGNOSIS — Z23 Encounter for immunization: Secondary | ICD-10-CM | POA: Diagnosis not present

## 2020-09-20 DIAGNOSIS — R52 Pain, unspecified: Secondary | ICD-10-CM

## 2020-09-20 DIAGNOSIS — E6609 Other obesity due to excess calories: Secondary | ICD-10-CM

## 2020-09-20 DIAGNOSIS — Z6832 Body mass index (BMI) 32.0-32.9, adult: Secondary | ICD-10-CM

## 2020-09-20 DIAGNOSIS — R7309 Other abnormal glucose: Secondary | ICD-10-CM

## 2020-09-20 DIAGNOSIS — E538 Deficiency of other specified B group vitamins: Secondary | ICD-10-CM

## 2020-09-20 DIAGNOSIS — E559 Vitamin D deficiency, unspecified: Secondary | ICD-10-CM

## 2020-09-20 DIAGNOSIS — F411 Generalized anxiety disorder: Secondary | ICD-10-CM | POA: Diagnosis not present

## 2020-09-20 DIAGNOSIS — L905 Scar conditions and fibrosis of skin: Secondary | ICD-10-CM | POA: Diagnosis not present

## 2020-09-20 DIAGNOSIS — Z0000 Encounter for general adult medical examination without abnormal findings: Secondary | ICD-10-CM | POA: Diagnosis not present

## 2020-09-20 DIAGNOSIS — I10 Essential (primary) hypertension: Secondary | ICD-10-CM | POA: Diagnosis not present

## 2020-09-20 LAB — COMPREHENSIVE METABOLIC PANEL
ALT: 7 U/L (ref 0–35)
AST: 14 U/L (ref 0–37)
Albumin: 4.6 g/dL (ref 3.5–5.2)
Alkaline Phosphatase: 79 U/L (ref 39–117)
BUN: 16 mg/dL (ref 6–23)
CO2: 30 mEq/L (ref 19–32)
Calcium: 10.1 mg/dL (ref 8.4–10.5)
Chloride: 101 mEq/L (ref 96–112)
Creatinine, Ser: 0.96 mg/dL (ref 0.40–1.20)
GFR: 59.66 mL/min — ABNORMAL LOW (ref >60.00)
Glucose, Bld: 94 mg/dL (ref 70–99)
Potassium: 4.3 mEq/L (ref 3.5–5.1)
Sodium: 139 mEq/L (ref 135–145)
Total Bilirubin: 0.7 mg/dL (ref 0.2–1.2)
Total Protein: 7.8 g/dL (ref 6.0–8.3)

## 2020-09-20 LAB — HEMOGLOBIN A1C: Hgb A1c MFr Bld: 5.8 % (ref 4.6–6.5)

## 2020-09-20 MED ORDER — TELMISARTAN 80 MG PO TABS
80.0000 mg | ORAL_TABLET | Freq: Every day | ORAL | 3 refills | Status: DC
Start: 2020-09-20 — End: 2021-09-26

## 2020-09-20 MED ORDER — ZOSTRIX-HP 0.075 % EX STCK
CUTANEOUS | 1 refills | Status: DC
Start: 2020-09-20 — End: 2021-05-02

## 2020-09-20 MED ORDER — GABAPENTIN 100 MG PO CAPS: 100.0000 mg | ORAL_CAPSULE | Freq: Three times a day (TID) | ORAL | 3 refills | Status: DC

## 2020-09-20 MED ORDER — TRIAMTERENE-HCTZ 37.5-25 MG PO TABS
0.5000 | ORAL_TABLET | Freq: Every day | ORAL | 1 refills | Status: DC
Start: 2020-09-20 — End: 2021-09-26

## 2020-09-20 MED ORDER — AMLODIPINE BESYLATE 5 MG PO TABS: 5.0000 mg | ORAL_TABLET | Freq: Every day | ORAL | 3 refills | Status: DC

## 2020-09-20 MED ORDER — HYDROCODONE-ACETAMINOPHEN 5-325 MG PO TABS: 1.0000 | ORAL_TABLET | Freq: Four times a day (QID) | ORAL | 0 refills | Status: DC | PRN

## 2020-09-20 NOTE — Assessment & Plan Note (Signed)
Vit D 

## 2020-09-20 NOTE — Progress Notes (Signed)
Subjective:  Patient ID: Jane Mooney, female    DOB: 12/13/1950  Age: 69 y.o. MRN: [id number]  CC: Follow-up (3 month F/U- Flu shot)   HPI [name] presents for L knee pain - severe on touching any area near the scar - 10 wks post-op [name] tried Lidoderm patch   Outpatient Medications Prior to Visit  Medication Sig Dispense Refill  . acetaminophen (TYLENOL) 500 MG tablet Take 1,000 mg by mouth every 6 (six) hours as needed for moderate pain or headache.    [name] amLODipine (NORVASC) 5 MG tablet Take 1 tablet (5 mg total) by mouth daily. 90 tablet 3  . cetirizine (ZYRTEC) 10 MG tablet TAKE 1 TABLET BY MOUTH TWICE A DAY 30 tablet 5  . cholecalciferol (VITAMIN D) 1000 units tablet Take 2 tablets (2,000 Units total) by mouth daily. (Patient taking differently: Take 1,000 Units by mouth daily. ) 100 tablet 11  . Cyanocobalamin (VITAMIN B-12) 1000 MCG SUBL Place 1 tablet (1,000 mcg total) under the tongue daily. 100 tablet 3  . famotidine (PEPCID) 20 MG tablet Take 1 tablet (20 mg total) by mouth 2 (two) times daily. Annual appt is due in must see provider for future refills 60 tablet 0  . fish oil-omega-3 fatty acids 1000 MG capsule Take 1 g by mouth daily.      . fluticasone (FLONASE) 50 MCG/ACT nasal spray Place 1 spray into both nostrils daily. (Patient taking differently: Place 1 spray into both nostrils daily as needed for allergies. ) 16 g 3  . GAVILAX 17 GM/SCOOP powder TAKE 17 G BY MOUTH 2 (TWO) TIMES DAILY AS NEEDED. (Patient taking differently: Take 17 g by mouth 2 (two) times daily as needed for moderate constipation. ) 510 g 1  . LORazepam (ATIVAN) 1 MG tablet Take 1 tablet (1 mg total) by mouth 2 (two) times daily as needed for anxiety. 60 tablet 3  . telmisartan (MICARDIS) 80 MG tablet Take 1 tablet (80 mg total) by mouth daily. Annual appt due in Nov must see provider for future refills 90 tablet 1  . triamcinolone ointment (KENALOG) 0.1 % Apply 1 application topically 4 (four)  times daily. (Patient taking differently: Apply 1 application topically 4 (four) times daily as needed (hemorrhoids). ) 80 g 1  . triamterene-hydrochlorothiazide (MAXZIDE-25) 37.5-25 MG tablet Take 0.5 tablets by mouth daily. 90 tablet 3   No facility-administered medications prior to visit.    ROS: Review of Systems  Constitutional: Negative for activity change, appetite change, chills, fatigue and unexpected weight change.  HENT: Negative for congestion, mouth sores and sinus pressure.   Eyes: Negative for visual disturbance.  Respiratory: Negative for cough and chest tightness.   Gastrointestinal: Negative for abdominal pain and nausea.  Genitourinary: Negative for difficulty urinating, frequency and vaginal pain.  Musculoskeletal: Positive for arthralgias. Negative for back pain and gait problem.  Skin: Negative for pallor and rash.  Neurological: Negative for dizziness, tremors, weakness, numbness and headaches.  Psychiatric/Behavioral: Negative for confusion and sleep disturbance.    Objective:  BP 118/82 (BP Location: Left Arm)   Pulse 80   Temp 98.5 F (36.9 C) (Oral)   Wt 171 lb 9.6 oz (77.8 kg)   SpO2 98%   BMI 31.39 kg/m   BP Readings from Last 3 Encounters:  09/20/20 118/82  08/18/20 110/80  07/05/20 136/82    Wt Readings from Last 3 Encounters:  09/20/20 171 lb 9.6 oz (77.8 kg)  07/05/20 181 lb (82.1  kg)  06/23/20 181 lb (82.1 kg)    Physical Exam Constitutional:      General: She is not in acute distress.    Appearance: She is well-developed.  HENT:     Head: Normocephalic.     Right Ear: External ear normal.     Left Ear: External ear normal.     Nose: Nose normal.  Eyes:     General:        Right eye: No discharge.        Left eye: No discharge.     Conjunctiva/sclera: Conjunctivae normal.     Pupils: Pupils are equal, round, and reactive to light.  Neck:     Thyroid: No thyromegaly.     Vascular: No JVD.     Trachea: No tracheal deviation.   Cardiovascular:     Rate and Rhythm: Normal rate and regular rhythm.     Heart sounds: Normal heart sounds.  Pulmonary:     Effort: No respiratory distress.     Breath sounds: No stridor. No wheezing.  Abdominal:     General: Bowel sounds are normal. There is no distension.     Palpations: Abdomen is soft. There is no mass.     Tenderness: There is no abdominal tenderness. There is no guarding or rebound.  Musculoskeletal:        General: Swelling and tenderness present.     Cervical back: Normal range of motion and neck supple.  Lymphadenopathy:     Cervical: No cervical adenopathy.  Skin:    Findings: No erythema or rash.  Neurological:     Cranial Nerves: No cranial nerve deficit.     Motor: No abnormal muscle tone.     Coordination: Coordination normal.     Deep Tendon Reflexes: Reflexes normal.  Psychiatric:        Behavior: Behavior normal.        Thought Content: Thought content normal.        Judgment: Judgment normal.   L knee scar -proximal part appears keloid, very sensitive/painful to palpation/touch Tender area around the scar Cane  Lab Results  Component Value Date   WBC 4.8 06/23/2020   HGB 12.5 06/23/2020   HCT 39.9 06/23/2020   PLT 222 06/23/2020   GLUCOSE 119 (H) 06/23/2020   CHOL 243 (H) 09/23/2019   TRIG 167.0 (H) 09/23/2019   HDL 40.70 09/23/2019   LDLDIRECT 153.6 07/06/2013   LDLCALC 169 (H) 09/23/2019   ALT 9 09/23/2019   AST 15 09/23/2019   NA 138 06/23/2020   K 4.2 06/23/2020   CL 103 06/23/2020   CREATININE 1.14 (H) 06/23/2020   BUN 15 06/23/2020   CO2 28 06/23/2020   TSH 1.71 09/23/2019   INR 1.0 06/23/2020   HGBA1C 5.8 07/28/2014    No results found.  Assessment & Plan:    [name], MD

## 2020-09-20 NOTE — Assessment & Plan Note (Signed)
Labs

## 2020-09-20 NOTE — Patient Instructions (Signed)
Jane Mooney , Thank you for taking time to come for your Medicare Wellness Visit. I appreciate your ongoing commitment to your health goals. Please review the following plan we discussed and let me know if I can assist you in the future.   Screening recommendations/referrals: Colonoscopy: 12/23/2013; due every 10 years Mammogram: 11/24/2019 Bone Density: 10/23/2016; due every 3 years Recommended yearly ophthalmology/optometry visit for glaucoma screening and checkup Recommended yearly dental visit for hygiene and checkup  Vaccinations: Influenza vaccine: 09/20/2020 Pneumococcal vaccine: up to date  Tdap vaccine: 07/02/2012; due every 10 years Shingles vaccine: Zoster 07/15/2012 Covid-19: up to date  Advanced directives: Please bring a copy of your health care power of attorney and living will to the office at your convenience.  Conditions/risks identified: Yes; Reviewed health maintenance screenings with patient today and relevant education, vaccines, and/or referrals were provided. Please continue to do your personal lifestyle choices by: daily care of teeth and gums, regular physical activity (goal should be 5 days a week for 30 minutes), eat a healthy diet, avoid tobacco and drug use, limiting any alcohol intake, taking a low-dose aspirin (if not allergic or have been advised by your provider otherwise) and taking vitamins and minerals as recommended by your provider. Continue doing brain stimulating activities (puzzles, reading, adult coloring books, staying active) to keep memory sharp. Continue to eat heart healthy diet (full of fruits, vegetables, whole grains, lean protein, water--limit salt, fat, and sugar intake) and increase physical activity as tolerated.  Next appointment: Please schedule your next Medicare Wellness Visit with your Nurse Health Advisor in 1 year by calling [phone].   Preventive Care 19 Years and Older, Female Preventive care refers to lifestyle choices and visits  with your health care provider that can promote health and wellness. What does preventive care include?  A yearly physical exam. This is also called an annual well check.  Dental exams once or twice a year.  Routine eye exams. Ask your health care provider how often you should have your eyes checked.  Personal lifestyle choices, including:  Daily care of your teeth and gums.  Regular physical activity.  Eating a healthy diet.  Avoiding tobacco and drug use.  Limiting alcohol use.  Practicing safe sex.  Taking low-dose aspirin every day.  Taking vitamin and mineral supplements as recommended by your health care provider. What happens during an annual well check? The services and screenings done by your health care provider during your annual well check will depend on your age, overall health, lifestyle risk factors, and family history of disease. Counseling  Your health care provider may ask you questions about your:  Alcohol use.  Tobacco use.  Drug use.  Emotional well-being.  Home and relationship well-being.  Sexual activity.  Eating habits.  History of falls.  Memory and ability to understand (cognition).  Work and work Statistician.  Reproductive health. Screening  You may have the following tests or measurements:  Height, weight, and BMI.  Blood pressure.  Lipid and cholesterol levels. These may be checked every 5 years, or more frequently if you are over 42 years old.  Skin check.  Lung cancer screening. You may have this screening every year starting at age 69 if you have a 30-pack-year history of smoking and currently smoke or have quit within the past 15 years.  Fecal occult blood test (FOBT) of the stool. You may have this test every year starting at age 49.  Flexible sigmoidoscopy or colonoscopy. You may have a  sigmoidoscopy every 5 years or a colonoscopy every 10 years starting at age 69.  Hepatitis C blood test.  Hepatitis B blood  test.  Sexually transmitted disease (STD) testing.  Diabetes screening. This is done by checking your blood sugar (glucose) after you have not eaten for a while (fasting). You may have this done every 1-3 years.  Bone density scan. This is done to screen for osteoporosis. You may have this done starting at age 63.  Mammogram. This may be done every 1-2 years. Talk to your health care provider about how often you should have regular mammograms. Talk with your health care provider about your test results, treatment options, and if necessary, the need for more tests. Vaccines  Your health care provider may recommend certain vaccines, such as:  Influenza vaccine. This is recommended every year.  Tetanus, diphtheria, and acellular pertussis (Tdap, Td) vaccine. You may need a Td booster every 10 years.  Zoster vaccine. You may need this after age 73.  Pneumococcal 13-valent conjugate (PCV13) vaccine. One dose is recommended after age 38.  Pneumococcal polysaccharide (PPSV23) vaccine. One dose is recommended after age 85. Talk to your health care provider about which screenings and vaccines you need and how often you need them. This information is not intended to replace advice given to you by your health care provider. Make sure you discuss any questions you have with your health care provider. Document Released: 11/25/2015 Document Revised: 07/18/2016 Document Reviewed: 08/30/2015 Elsevier Interactive Patient Education  2017 [place] Prevention in the Home Falls can cause injuries. They can happen to people of all ages. There are many things you can do to make your home safe and to help prevent falls. What can I do on the outside of my home?  Regularly fix the edges of walkways and driveways and fix any cracks.  Remove anything that might make you trip as you walk through a door, such as a raised step or threshold.  Trim any bushes or trees on the path to your home.  Use  bright outdoor lighting.  Clear any walking paths of anything that might make someone trip, such as rocks or tools.  Regularly check to see if handrails are loose or broken. Make sure that both sides of any steps have handrails.  Any raised decks and porches should have guardrails on the edges.  Have any leaves, snow, or ice cleared regularly.  Use sand or salt on walking paths during winter.  Clean up any spills in your garage right away. This includes oil or grease spills. What can I do in the bathroom?  Use night lights.  Install grab bars by the toilet and in the tub and shower. Do not use towel bars as grab bars.  Use non-skid mats or decals in the tub or shower.  If you need to sit down in the shower, use a plastic, non-slip stool.  Keep the floor dry. Clean up any water that spills on the floor as soon as it happens.  Remove soap buildup in the tub or shower regularly.  Attach bath mats securely with double-sided non-slip rug tape.  Do not have throw rugs and other things on the floor that can make you trip. What can I do in the bedroom?  Use night lights.  Make sure that you have a light by your bed that is easy to reach.  Do not use any sheets or blankets that are too big for your bed. They  should not hang down onto the floor.  Have a firm chair that has side arms. You can use this for support while you get dressed.  Do not have throw rugs and other things on the floor that can make you trip. What can I do in the kitchen?  Clean up any spills right away.  Avoid walking on wet floors.  Keep items that you use a lot in easy-to-reach places.  If you need to reach something above you, use a strong step stool that has a grab bar.  Keep electrical cords out of the way.  Do not use floor polish or wax that makes floors slippery. If you must use wax, use non-skid floor wax.  Do not have throw rugs and other things on the floor that can make you trip. What can  I do with my stairs?  Do not leave any items on the stairs.  Make sure that there are handrails on both sides of the stairs and use them. Fix handrails that are broken or loose. Make sure that handrails are as long as the stairways.  Check any carpeting to make sure that it is firmly attached to the stairs. Fix any carpet that is loose or worn.  Avoid having throw rugs at the top or bottom of the stairs. If you do have throw rugs, attach them to the floor with carpet tape.  Make sure that you have a light switch at the top of the stairs and the bottom of the stairs. If you do not have them, ask someone to add them for you. What else can I do to help prevent falls?  Wear shoes that:  Do not have high heels.  Have rubber bottoms.  Are comfortable and fit you well.  Are closed at the toe. Do not wear sandals.  If you use a stepladder:  Make sure that it is fully opened. Do not climb a closed stepladder.  Make sure that both sides of the stepladder are locked into place.  Ask someone to hold it for you, if possible.  Clearly mark and make sure that you can see:  Any grab bars or handrails.  First and last steps.  Where the edge of each step is.  Use tools that help you move around (mobility aids) if they are needed. These include:  Canes.  Walkers.  Scooters.  Crutches.  Turn on the lights when you go into a dark area. Replace any light bulbs as soon as they burn out.  Set up your furniture so you have a clear path. Avoid moving your furniture around.  If any of your floors are uneven, fix them.  If there are any pets around you, be aware of where they are.  Review your medicines with your doctor. Some medicines can make you feel dizzy. This can increase your chance of falling. Ask your doctor what other things that you can do to help prevent falls. This information is not intended to replace advice given to you by your health care provider. Make sure you  discuss any questions you have with your health care provider. Document Released: 08/25/2009 Document Revised: 04/05/2016 Document Reviewed: 12/03/2014 Elsevier Interactive Patient Education  2017 [employer].

## 2020-09-20 NOTE — Assessment & Plan Note (Addendum)
L knee - s/p L TKR 10 wks ago L knee scar -proximal part appears keloid, very sensitive/painful to palpation/touch Severe pain Gabapentin Zostrix Ice, heat Norco Rx refilled  Potential benefits of a long and short term opioids use as well as potential risks (i.e. addiction risk, apnea etc) and complications (i.e. Somnolence, constipation and others) were explained to the patient and were aknowledged.

## 2020-09-20 NOTE — Progress Notes (Addendum)
Subjective:   Jane Mooney is a 69 y.o. female who presents for Medicare Annual (Subsequent) preventive examination.  Review of Systems    No ROS. Medicare Wellness Visit. Additional risk factors are reflected in social history. Cardiac Risk Factors include: advanced age (>53men, >63 women);dyslipidemia;hypertension;family history of premature cardiovascular disease;obesity (BMI >30kg/m2)     Objective:    Today's Vitals   09/20/20 1409 09/20/20 1437  BP: 118/82   Pulse: 80   Temp: 98.5 F (36.9 C)   SpO2: 98%   Weight: 171 lb 8.3 oz (77.8 kg)   Height: 5\' 3"  (1.6 m)   PainSc:  6    Body mass index is 30.38 kg/m.  Advanced Directives 09/20/2020 07/05/2020 06/23/2020 06/27/2015 05/19/2014  Does Patient Have a Medical Advance Directive? Yes No Yes Yes Patient does not have advance directive  Type of Advance Directive Healthcare Power of Vici;Living will - -  Does patient want to make changes to medical advance directive? No - Patient declined - - - -  Copy of [place] in Chart? No - copy requested - - Yes -  Would patient like information on creating a medical advance directive? - No - Patient declined - - -    Current Medications (verified) Outpatient Encounter Medications as of 09/20/2020  Medication Sig   acetaminophen (TYLENOL) 500 MG tablet Take 1,000 mg by mouth every 6 (six) hours as needed for moderate pain or headache.   amLODipine (NORVASC) 5 MG tablet Take 1 tablet (5 mg total) by mouth daily.   cetirizine (ZYRTEC) 10 MG tablet TAKE 1 TABLET BY MOUTH TWICE A DAY   cholecalciferol (VITAMIN D) 1000 units tablet Take 2 tablets (2,000 Units total) by mouth daily. (Patient taking differently: Take 1,000 Units by mouth daily. )   Cyanocobalamin (VITAMIN B-12) 1000 MCG SUBL Place 1 tablet (1,000 mcg total) under the tongue daily.   famotidine (PEPCID) 20 MG tablet Take 1 tablet (20 mg total) by mouth 2 (two) times  daily. Annual appt is due in must see provider for future refills   fish oil-omega-3 fatty acids 1000 MG capsule Take 1 g by mouth daily.     fluticasone (FLONASE) 50 MCG/ACT nasal spray Place 1 spray into both nostrils daily. (Patient taking differently: Place 1 spray into both nostrils daily as needed for allergies. )   gabapentin (NEURONTIN) 100 MG capsule Take 1-2 capsules (100-200 mg total) by mouth in the morning, at noon, and at bedtime.   GAVILAX 17 GM/SCOOP powder TAKE 17 G BY MOUTH 2 (TWO) TIMES DAILY AS NEEDED. (Patient taking differently: Take 17 g by mouth 2 (two) times daily as needed for moderate constipation. )   LORazepam (ATIVAN) 1 MG tablet Take 1 tablet (1 mg total) by mouth 2 (two) times daily as needed for anxiety.   telmisartan (MICARDIS) 80 MG tablet Take 1 tablet (80 mg total) by mouth daily.   triamcinolone ointment (KENALOG) 0.1 % Apply 1 application topically 4 (four) times daily. (Patient taking differently: Apply 1 application topically 4 (four) times daily as needed (hemorrhoids). )   triamterene-hydrochlorothiazide (MAXZIDE-25) 37.5-25 MG tablet Take 0.5 tablets by mouth daily.   [DISCONTINUED] LORazepam (ATIVAN) 1 MG tablet TAKE 1 TABLET BY MOUTH TWICE A DAY AS NEEDED FOR ANXIETY.   No facility-administered encounter medications on file as of 09/20/2020.    Allergies (verified) Citalopram, Mirtazapine, and Quinapril hcl   History: Past Medical History:  Diagnosis  Date   Allergic rhinitis    Anemia    Anxiety 2010   Chronic kidney disease    renal insufficiency    Depression    DJD (degenerative joint disease)    Left knee   Heart murmur    as a child    Hemorrhoid    HTN (hypertension)    Hyperlipidemia    LBP (low back pain)    MVP (mitral valve prolapse)    PONV (postoperative nausea and vomiting)    Symptomatic PVCs    Urticaria    recurrent   Past Surgical History:  Procedure Laterality Date   ABDOMINAL HYSTERECTOMY  1984   HEMORRHOID  SURGERY     KNEE ARTHROSCOPY WITH MEDIAL MENISECTOMY Right 05/21/2014   Procedure: RIGHT KNEE ARTHROSCOPY WITH DEBRIDEMENT/SHAVING, MEDIAL MENISECTOMY;  Surgeon: Renette Butters, MD;  Location: [facility];  Service: Orthopedics;  Laterality: Right;   KNEE SURGERY  03/2007   Left Knee Arthr- Dr Onnie Graham   right bunionectomy      ROTATOR CUFF REPAIR  2004   Left    TONSILLECTOMY     TOTAL KNEE ARTHROPLASTY Left 07/05/2020   Procedure: TOTAL KNEE ARTHROPLASTY;  Surgeon: Renette Butters, MD;  Location: [facility];  Service: Orthopedics;  Laterality: Left;   [place]     Family History  Problem Relation Age of Onset   Hypertension Mother    Diabetes Other    Hypertension Other    Breast cancer Other    Cancer Other        breast    Colon cancer Neg Hx    Esophageal cancer Neg Hx    Stomach cancer Neg Hx    Rectal cancer Neg Hx    Social History   Socioeconomic History   Marital status: Married    Spouse name: Not on file   Number of children: 2   Years of education: Not on file   Highest education level: Not on file  Occupational History   Occupation: Retired Programmer, 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  Tobacco Use   Smoking status: Never Smoker   Smokeless tobacco: Never Used  Vaping Use   Vaping Use: Never used  Substance and Sexual Activity   Alcohol use: Not Currently   Drug use: No   Sexual activity: Yes  Other Topics Concern   Not on file  Social History Narrative   Not on file   Social Determinants of Health   Financial Resource Strain: Low Risk    Difficulty of Paying Living Expenses: Not hard at all  Food Insecurity: No Food Insecurity   Worried About Charity fundraiser in the Last Year: Never true   [place] in the Last Year: Never true  Transportation Needs: No Transportation Needs   Lack of Transportation (Medical): No   Lack of Transportation (Non-Medical): No  Physical Activity: Inactive   Days of Exercise per Week: 0 days    Minutes of Exercise per Session: 0 min  Stress: No Stress Concern Present   Feeling of Stress : Not at all  Social Connections: Moderately Isolated   Frequency of Communication with Friends and Family: More than three times a week   Frequency of Social Gatherings with Friends and Family: Once a week   Attends Religious Services: Never   Marine scientist or Organizations: No   Attends Archivist Meetings: Never   Marital Status: Married  Tobacco Counseling Counseling given: Not Answered   Clinical Intake:  Pre-visit preparation completed: Yes  Pain : 0-10 Pain Score: 6  Pain Type: Acute pain Pain Location: Knee Pain Orientation: Left Pain Descriptors / Indicators: Aching, Throbbing, Discomfort, Constant Pain Onset: More than a month ago Pain Frequency: Constant Pain Relieving Factors: Pain medication Effect of Pain on Daily Activities: Pain can diminish job performance, lower motivation to exercise, and prevent you from completing daily tasks.  Pain Relieving Factors: Pain medication  BMI - recorded: 30.38 Nutritional Status: BMI > 30  Obese Nutritional Risks: None Diabetes: No  How often do you need to have someone help you when you read instructions, pamphlets, or other written materials from your doctor or pharmacy?: 1 - Never What is the last grade level you completed in school?: College; Retired Therapist, sports  Diabetic? no  Interpreter Needed?: No  Information entered by :: Lisette Abu, LPN   Activities of Daily Living In your present state of health, do you have any difficulty performing the following activities: 09/20/2020 06/23/2020  Hearing? N N  Vision? N N  Difficulty concentrating or making decisions? N N  Walking or climbing stairs? Y Y  Dressing or bathing? N N  Doing errands, shopping? N N  Preparing Food and eating ? N -  Using the Toilet? N -  In the past six months, have you accidently leaked urine? N -  Do you have problems with  loss of bowel control? N -  Managing your Medications? N -  Managing your Finances? N -  Housekeeping or managing your Housekeeping? N -  Some recent data might be hidden    Patient Care Team: Plotnikov, Evie Lacks, MD as PCP - General Olevia Perches, Lowella Bandy, MD (Inactive) (Gastroenterology) Lomax, Marny Lowenstein, MD (Inactive) as Surgeon (Obstetrics and Gynecology) Key, Nelia Shi, NP as Nurse Practitioner (Gynecology) Renette Butters, MD as Attending Physician (Orthopedic Surgery) Gregor Hams, MD as Consulting Physician (Sports Medicine) Elsie Saas, MD as Consulting Physician (Orthopedic Surgery)  Indicate any recent Medical Services you may have received from other than [facility] providers in the past year (date may be approximate).     Assessment:   This is a routine wellness examination for [place].  Hearing/Vision screen No exam data present  Dietary issues and exercise activities discussed: Current Exercise Habits: The patient does not participate in regular exercise at present, Exercise limited by: orthopedic condition(s)  Goals      Weight < 160 lb (72.576 kg)     Joined [employer] and plans to lose to 160; [employer] has great social support and calorie tracks;         Depression Screen PHQ 2/9 Scores 09/20/2020 06/18/2018 10/23/2016 06/27/2015  PHQ - 2 Score 1 0 0 0    Fall Risk Fall Risk  09/20/2020 12/22/2019 06/18/2018 10/23/2016 06/27/2015  Falls in the past year? 0 0 No No No  Number falls in past yr: 0 0 - - -  Injury with Fall? 0 0 - - -  Risk for fall due to : No Fall Risks - - - Impaired mobility  Risk for fall due to: Comment - - - - right knee sitll hurts; but is not limiting at this time  Follow up Falls evaluation completed;Education provided - - - -    Any stairs in or around the home? Yes  If so, are there any without handrails? No  Home free of loose throw rugs in walkways, pet beds, electrical cords,  etc? Yes  Adequate lighting in your home to reduce risk of falls? Yes     ASSISTIVE DEVICES UTILIZED TO PREVENT FALLS:  Life alert? No  Use of a cane, walker or w/c? Yes  Grab bars in the bathroom? No  Shower chair or bench in shower? Yes  Elevated toilet seat or a handicapped toilet? No   TIMED UP AND GO:  Was the test performed? No .  Length of time to ambulate 10 feet: 0 sec.   Gait slow and steady with assistive device  Cognitive Function: MMSE - Mini Mental State Exam 06/27/2015  Not completed: Unable to complete     6CIT Screen 09/20/2020  What Year? 0 points  What month? 0 points  What time? 0 points  Count back from 20 0 points  Months in reverse 0 points  Repeat phrase 0 points  Total Score 0    Immunizations Immunization History  Administered Date(s) Administered   Fluad Quad(high Dose 65+) 07/29/2019, 09/20/2020   Influenza Split 08/03/2011, 09/09/2012   Influenza Whole 07/05/2010   Influenza, High Dose Seasonal PF 08/17/2016, 07/23/2017, 09/19/2018   Influenza,inj,Quad PF,6+ Mos 07/10/2013, 07/28/2014, 09/29/2015   PFIZER SARS-COV-2 Vaccination 12/26/2019, 01/18/2020, 08/27/2020   Pneumococcal Conjugate-13 03/31/2015   Pneumococcal Polysaccharide-23 11/15/2014   Tdap 07/02/2012   Zoster 07/15/2012    TDAP status: Up to date Flu Vaccine status: Up to date Pneumococcal vaccine status: Up to date Covid-19 vaccine status: Completed vaccines  Qualifies for Shingles Vaccine? Yes   Zostavax completed Yes   Shingrix Completed?: No.    Education has been provided regarding the importance of this vaccine. Patient has been advised to call insurance company to determine out of pocket expense if they have not yet received this vaccine. Advised may also receive vaccine at local pharmacy or Health Dept. Verbalized acceptance and understanding.  Screening Tests Health Maintenance  Topic Date Due   MAMMOGRAM  11/23/2021   TETANUS/TDAP  07/02/2022   COLONOSCOPY  12/24/2023   INFLUENZA VACCINE  Completed   DEXA SCAN  Completed    COVID-19 Vaccine  Completed   Hepatitis C Screening  Completed   PNA vac Low Risk Adult  Completed    Health Maintenance  There are no preventive care reminders to display for this patient.  Colorectal cancer screening: Completed 12/23/2013. Repeat every 10 years Mammogram status: Completed 11/24/2019. Repeat every year Bone Density status: Completed 10/23/2016. Results reflect: Bone density results: OSTEOPENIA. Repeat every 3 years.  Lung Cancer Screening: (Low Dose CT Chest recommended if Age 55-80 years, 30 pack-year currently smoking OR have quit w/in 15years.) does not qualify.   Lung Cancer Screening Referral: no  Additional Screening:  Hepatitis C Screening: does qualify; Completed: yes  Vision Screening: Recommended annual ophthalmology exams for early detection of glaucoma and other disorders of the eye. Is the patient up to date with their annual eye exam?  Yes  Who is the provider or what is the name of the office in which the patient attends annual eye exams? Clent Jacks, MD If pt is not established with a provider, would they like to be referred to a provider to establish care? No .   Dental Screening: Recommended annual dental exams for proper oral hygiene  Community Resource Referral / Chronic Care Management: CRR required this visit?  No   CCM required this visit?  No      Plan:     I have personally reviewed and noted the following in the patient's  chart:   Medical and social history Use of alcohol, tobacco or illicit drugs  Current medications and supplements Functional ability and status Nutritional status Physical activity Advanced directives List of other physicians Hospitalizations, surgeries, and ER visits in previous 12 months Vitals Screenings to include cognitive, depression, and falls Referrals and appointments  In addition, I have reviewed and discussed with patient certain preventive protocols, quality metrics, and best practice  recommendations. A written personalized care plan for preventive services as well as general preventive health recommendations were provided to patient.     Sheral Flow, LPN   16/11/958   Nurse Notes: n/a   Medical screening examination/treatment/procedure(s) were performed by non-physician practitioner and as supervising physician I was immediately available for consultation/collaboration.  I agree with above. Lew Dawes, MD

## 2020-09-20 NOTE — Assessment & Plan Note (Signed)
On B12 

## 2020-09-20 NOTE — Assessment & Plan Note (Signed)
Lorazepam prn  Potential benefits of a long term benzodiazepines  use as well as potential risks  and complications were explained to the patient and were aknowledged.  

## 2020-09-20 NOTE — Assessment & Plan Note (Signed)
Amlodipine, Losartan, Triamt-HCTZ 0.5 tab

## 2020-09-20 NOTE — Assessment & Plan Note (Signed)
Lost 10 lbs

## 2020-09-25 ENCOUNTER — Other Ambulatory Visit: Payer: Self-pay | Admitting: Internal Medicine

## 2020-10-16 ENCOUNTER — Other Ambulatory Visit: Payer: Self-pay | Admitting: Allergy & Immunology

## 2020-10-17 DIAGNOSIS — M25462 Effusion, left knee: Secondary | ICD-10-CM | POA: Diagnosis not present

## 2020-10-19 ENCOUNTER — Encounter: Payer: Self-pay | Admitting: Internal Medicine

## 2020-10-19 ENCOUNTER — Ambulatory Visit (INDEPENDENT_AMBULATORY_CARE_PROVIDER_SITE_OTHER): Payer: Medicare HMO | Admitting: Internal Medicine

## 2020-10-19 ENCOUNTER — Other Ambulatory Visit: Payer: Self-pay

## 2020-10-19 DIAGNOSIS — N183 Chronic kidney disease, stage 3 unspecified: Secondary | ICD-10-CM | POA: Diagnosis not present

## 2020-10-19 DIAGNOSIS — E538 Deficiency of other specified B group vitamins: Secondary | ICD-10-CM

## 2020-10-19 DIAGNOSIS — N2889 Other specified disorders of kidney and ureter: Secondary | ICD-10-CM

## 2020-10-19 DIAGNOSIS — I10 Essential (primary) hypertension: Secondary | ICD-10-CM

## 2020-10-19 MED ORDER — LORAZEPAM 1 MG PO TABS: 1.0000 mg | ORAL_TABLET | Freq: Two times a day (BID) | ORAL | 3 refills | Status: DC | PRN

## 2020-10-19 NOTE — Assessment & Plan Note (Signed)
On B12 

## 2020-10-19 NOTE — Assessment & Plan Note (Signed)
Treat HTN Monitor labs

## 2020-10-19 NOTE — Progress Notes (Signed)
Subjective:  Patient ID: Jane Mooney, female    DOB: 07-23-1951  Age: 69 y.o. MRN: [id number]  CC: Follow-up (1 MONTH F/U)   HPI TALLIE DODDS presents for L knee pain - better, CRI F/u anxiety  Outpatient Medications Prior to Visit  Medication Sig Dispense Refill  . acetaminophen (TYLENOL) 500 MG tablet Take 1,000 mg by mouth every 6 (six) hours as needed for moderate pain or headache.    [name] amLODipine (NORVASC) 5 MG tablet Take 1 tablet (5 mg total) by mouth daily. 90 tablet 3  . Capsaicin (ZOSTRIX-HP) 0.075 % STCK Use qid 1 g 1  . cetirizine (ZYRTEC) 10 MG tablet TAKE 1 TABLET BY MOUTH TWICE A DAY 180 tablet 0  . cholecalciferol (VITAMIN D) 1000 units tablet Take 2 tablets (2,000 Units total) by mouth daily. (Patient taking differently: Take 1,000 Units by mouth daily. ) 100 tablet 11  . Cyanocobalamin (VITAMIN B-12) 1000 MCG SUBL Place 1 tablet (1,000 mcg total) under the tongue daily. 100 tablet 3  . famotidine (PEPCID) 20 MG tablet TAKE 1 TABLET BY MOUTH TWICE A DAY 180 tablet 1  . fish oil-omega-3 fatty acids 1000 MG capsule Take 1 g by mouth daily.      . fluticasone (FLONASE) 50 MCG/ACT nasal spray Place 1 spray into both nostrils daily. (Patient taking differently: Place 1 spray into both nostrils daily as needed for allergies. ) 16 g 3  . gabapentin (NEURONTIN) 100 MG capsule Take 1-2 capsules (100-200 mg total) by mouth in the morning, at noon, and at bedtime. 180 capsule 3  . GAVILAX 17 GM/SCOOP powder TAKE 17 G BY MOUTH 2 (TWO) TIMES DAILY AS NEEDED. (Patient taking differently: Take 17 g by mouth 2 (two) times daily as needed for moderate constipation. ) 510 g 1  . HYDROcodone-acetaminophen (NORCO/VICODIN) 5-325 MG tablet Take 1 tablet by mouth every 6 (six) hours as needed for severe pain. 30 tablet 0  . LORazepam (ATIVAN) 1 MG tablet Take 1 tablet (1 mg total) by mouth 2 (two) times daily as needed for anxiety. 60 tablet 3  . telmisartan (MICARDIS) 80 MG tablet Take 1  tablet (80 mg total) by mouth daily. 90 tablet 3  . triamcinolone ointment (KENALOG) 0.1 % Apply 1 application topically 4 (four) times daily. (Patient taking differently: Apply 1 application topically 4 (four) times daily as needed (hemorrhoids). ) 80 g 1  . triamterene-hydrochlorothiazide (MAXZIDE-25) 37.5-25 MG tablet Take 0.5 tablets by mouth daily. 90 tablet 1   No facility-administered medications prior to visit.    ROS: Review of Systems  Constitutional: Negative for activity change, appetite change, chills, fatigue and unexpected weight change.  HENT: Negative for congestion, mouth sores and sinus pressure.   Eyes: Negative for visual disturbance.  Respiratory: Negative for cough and chest tightness.   Gastrointestinal: Negative for abdominal pain and nausea.  Genitourinary: Negative for difficulty urinating, frequency and vaginal pain.  Musculoskeletal: Positive for arthralgias and gait problem. Negative for back pain.  Skin: Negative for pallor and rash.  Neurological: Negative for dizziness, tremors, weakness, numbness and headaches.  Psychiatric/Behavioral: Negative for confusion and sleep disturbance. The patient is nervous/anxious.     Objective:  BP (!) 142/80 (BP Location: Left Arm)   Pulse 63   Temp 99 F (37.2 C) (Oral)   Wt 175 lb 9.6 oz (79.7 kg)   SpO2 98%   BMI 31.11 kg/m   BP Readings from Last 3 Encounters:  10/19/20 (!) 142/80  09/20/20 118/82  09/20/20 118/82    Wt Readings from Last 3 Encounters:  10/19/20 175 lb 9.6 oz (79.7 kg)  09/20/20 171 lb 8.3 oz (77.8 kg)  09/20/20 171 lb 9.6 oz (77.8 kg)    Physical Exam Constitutional:      General: She is not in acute distress.    Appearance: She is well-developed.  HENT:     Head: Normocephalic.     Right Ear: External ear normal.     Left Ear: External ear normal.     Nose: Nose normal.  Eyes:     General:        Right eye: No discharge.        Left eye: No discharge.      Conjunctiva/sclera: Conjunctivae normal.     Pupils: Pupils are equal, round, and reactive to light.  Neck:     Thyroid: No thyromegaly.     Vascular: No JVD.     Trachea: No tracheal deviation.  Cardiovascular:     Rate and Rhythm: Normal rate and regular rhythm.     Heart sounds: Normal heart sounds.  Pulmonary:     Effort: No respiratory distress.     Breath sounds: No stridor. No wheezing.  Abdominal:     General: Bowel sounds are normal. There is no distension.     Palpations: Abdomen is soft. There is no mass.     Tenderness: There is no abdominal tenderness. There is no guarding or rebound.  Musculoskeletal:        General: Tenderness present.     Cervical back: Normal range of motion and neck supple.  Lymphadenopathy:     Cervical: No cervical adenopathy.  Skin:    Findings: No erythema or rash.  Neurological:     Cranial Nerves: No cranial nerve deficit.     Motor: No abnormal muscle tone.     Coordination: Coordination normal.     Deep Tendon Reflexes: Reflexes normal.  Psychiatric:        Behavior: Behavior normal.        Thought Content: Thought content normal.        Judgment: Judgment normal.     L knee - looks better Scar - NT, soft Limping less  Lab Results  Component Value Date   WBC 4.8 06/23/2020   HGB 12.5 06/23/2020   HCT 39.9 06/23/2020   PLT 222 06/23/2020   GLUCOSE 94 09/20/2020   CHOL 243 (H) 09/23/2019   TRIG 167.0 (H) 09/23/2019   HDL 40.70 09/23/2019   LDLDIRECT 153.6 07/06/2013   LDLCALC 169 (H) 09/23/2019   ALT 7 09/20/2020   AST 14 09/20/2020   NA 139 09/20/2020   K 4.3 09/20/2020   CL 101 09/20/2020   CREATININE 0.96 09/20/2020   BUN 16 09/20/2020   CO2 30 09/20/2020   TSH 1.71 09/23/2019   INR 1.0 06/23/2020   HGBA1C 5.8 09/20/2020    No results found.  Assessment & Plan:

## 2020-10-19 NOTE — Assessment & Plan Note (Signed)
Amlodipine, Losartan, Triamt-HCTZ 0.5 tab

## 2020-10-20 ENCOUNTER — Other Ambulatory Visit: Payer: Self-pay | Admitting: Internal Medicine

## 2020-12-13 ENCOUNTER — Ambulatory Visit: Payer: Medicare HMO | Admitting: Allergy & Immunology

## 2020-12-13 ENCOUNTER — Encounter: Payer: Self-pay | Admitting: Allergy & Immunology

## 2020-12-13 ENCOUNTER — Other Ambulatory Visit: Payer: Self-pay

## 2020-12-13 VITALS — BP 122/60 | HR 60 | Temp 98.2°F | Resp 18 | Ht 63.0 in

## 2020-12-13 DIAGNOSIS — L500 Allergic urticaria: Secondary | ICD-10-CM

## 2020-12-13 DIAGNOSIS — J302 Other seasonal allergic rhinitis: Secondary | ICD-10-CM

## 2020-12-13 DIAGNOSIS — J309 Allergic rhinitis, unspecified: Secondary | ICD-10-CM

## 2020-12-13 MED ORDER — HYDROXYZINE HCL 25 MG PO TABS: 50.0000 mg | ORAL_TABLET | Freq: Every day | ORAL | 5 refills | Status: AC

## 2020-12-13 NOTE — Patient Instructions (Addendum)
1. Seasonal and perennial allergic rhinitis (grasses, ragweed, weeds, trees, indoor molds, outdoor molds, dust mites and cat) - Continue with: Zyrtec (cetirizine) 10mg  TWO TIMES daily and Flonase (fluticasone) one spray per nostril daily - I think we can avoid allergy shots for now.     2. Allergic urticaria - Previous lab workup was fairly normal. - Let's drop the famotidine to see how you do.   - Morning: Zyrtec (cetirizine) 10mg  (one tablet)   - Evening: Zyrtec (cetirizine) 10mg  (one tablet) + hydroxyzine 20-50mg  at night - You can change this dosing at home, decreasing the dose as needed or increasing the dosing as needed.  - Consider starting Xolair for long term control without all of the medications.   3. Itchy ears - Use the triamcinolone ointment 1-2 times daily applied with a Q tip into the ear. - Do not go to far and puncture your ear drum!  - We  Could try a steroid drop if this is not working.   4. Return in about 6 months (around 06/12/2021).   Please inform us of any Emergency Department visits, hospitalizations, or changes in symptoms. Call us before going to the ED for breathing or allergy symptoms since we might be able to fit you in for a sick visit. Feel free to contact us anytime with any questions, problems, or concerns.  It was a pleasure to see you again today!  Websites that have reliable patient information: 1. American Academy of Asthma, Allergy, and Immunology: www.aaaai.org 2. Food Allergy Research and Education (FARE): foodallergy.org 3. Mothers of Asthmatics: http://www.asthmacommunitynetwork.org 4. American College of Allergy, Asthma, and Immunology: www.acaai.org   COVID-19 Vaccine Information can be found at: ShippingScam.co.uk For questions related to vaccine distribution or appointments, please email vaccine@[email or web address] .com or call [phone].     "Like" [place] on Facebook and Instagram for our  latest updates!       Make sure you are registered to vote! If you have moved or changed any of your contact information, you will need to get this updated before voting!  In some cases, you MAY be able to register to vote online: [email or web address]

## 2020-12-13 NOTE — Progress Notes (Signed)
FOLLOW UP  Date of Service/Encounter:  12/13/20   Assessment:   Seasonal and perennial allergic rhinitis(grasses, ragweed, weeds, trees, indoor molds, outdoor molds, dust mites and cat)  Allergic urticaria  Plan/Recommendations:   1. Seasonal and perennial allergic rhinitis (grasses, ragweed, weeds, trees, indoor molds, outdoor molds, dust mites and cat) - Continue with: Zyrtec (cetirizine) 10mg  TWO TIMES daily and Flonase (fluticasone) one spray per nostril daily - I think we can avoid allergy shots for now.     2. Allergic urticaria - Previous lab workup was fairly normal. - Let's drop the famotidine to see how you do.   - Morning: Zyrtec (cetirizine) 10mg  (one tablet)   - Evening: Zyrtec (cetirizine) 10mg  (one tablet) + hydroxyzine 20-50mg  at night - You can change this dosing at home, decreasing the dose as needed or increasing the dosing as needed.  - Consider starting Xolair for long term control without all of the medications.   3. Itchy ears - Use the triamcinolone ointment 1-2 times daily applied with a Q tip into the ear. - Do not go to far and puncture your ear drum!  - We  Could try a steroid drop if this is not working.   4. Return in about 6 months (around 06/12/2021).    Subjective:   Jane Mooney is a 70 y.o. female presenting today for follow up of  Chief Complaint  Patient presents with  . Urticaria  . Allergic Rhinitis     Jane Mooney has a history of the following: Patient Active Problem List   Diagnosis Date Noted  . Pain in surgical scar 09/20/2020  . Seasonal and perennial allergic rhinitis 03/31/2020  . Decreased GFR 12/22/2019  . CRI (chronic renal insufficiency), stage 3 (moderate) ([place]) 09/26/2019  . Abscess of face 06/13/2017  . Constipation 02/22/2017  . Chest pain, atypical 11/30/2016  . GERD (gastroesophageal reflux disease) 11/30/2016  . Menopause 10/23/2016  . Abscess of finger, left 08/17/2016  . Thoracic back pain  09/29/2015  . Osteoarthritis of both knees 09/29/2015  . Allergic urticaria 03/29/2014  . Grief 03/22/2014  . Urticaria 01/18/2014  . Acute upper respiratory infections of unspecified site 09/02/2013  . Well adult exam 07/02/2012  . Obesity 03/06/2012  . Cervical pain (neck) 12/28/2011  . Neoplasm of uncertain behavior of skin 08/03/2011  . Onychomycosis 08/03/2011  . URI, acute 06/20/2011  . Vitamin B 12 deficiency 05/07/2011  . Hemorrhoid 05/03/2011  . Dyslipidemia 10/31/2010  . TRIGGER FINGER 03/30/2010  . BRONCHITIS, ACUTE 12/15/2009  . WEIGHT LOSS 12/15/2009  . LOW BACK PAIN 10/25/2009  . HYPERGLYCEMIA 10/25/2009  . Vitamin D deficiency 02/24/2009  . VERTIGO 04/09/2008  . KNEE PAIN 03/30/2008  . ALLERGIC RHINITIS 02/13/2008  . Pain in joint, shoulder region 02/13/2008  . Anxiety state 09/30/2007  . Situational depression 09/30/2007  . EAR PAIN 09/30/2007  . Essential hypertension 09/30/2007    History obtained from: chart review and patient.  Jane Mooney is a 70 y.o. female presenting for a follow up visit.  She was last seen in October 2021.  At that time, we continue his Zyrtec twice daily and Flonase.  For her urticaria, she had a previous work-up that was normal.  We continued with Zyrtec twice daily.  For lab work-up included an ANA, alpha gal panel, inflammatory markers, and tryptase which were all normal in May 2021.  Since the last visit, she has mostly done well. She remains on the cetirizine twice daily. She does  report that she has itching at night 3-4 nights per week. She does not have the breakouts any longer. She has been on Atarax years ago which worked well for her. She is interested in trying this again to see if it helps. It tends to be on her shoulders. Again she has no rash with this.   Overall, she is doing very well. She is smiling as always today. She does report that she has rhinorrhea in the morning. She uses the [facility] and this works for that.    Otherwise, there have been no changes to her past medical history, surgical history, family history, or social history.    Review of Systems  Constitutional: Negative.  Negative for chills, fever, malaise/fatigue and weight loss.  HENT: Negative.  Negative for congestion, ear discharge, ear pain and sore throat.   Eyes: Negative for pain, discharge and redness.  Respiratory: Negative for cough, sputum production, shortness of breath, wheezing and stridor.   Cardiovascular: Negative.  Negative for chest pain and palpitations.  Gastrointestinal: Negative for abdominal pain, constipation, diarrhea, heartburn, nausea and vomiting.  Skin: Positive for itching and rash.  Neurological: Negative for dizziness and headaches.  Endo/Heme/Allergies: Negative for environmental allergies. Does not bruise/bleed easily.       Objective:   Blood pressure 122/60, pulse 60, temperature 98.2 F (36.8 C), temperature source Temporal, resp. rate 18, height 5\' 3"  (1.6 m), SpO2 99 %. Body mass index is 31.11 kg/m.   Physical Exam:  Physical Exam Constitutional:      Appearance: She is well-developed.     Comments: Pleasant talkative female.  HENT:     Head: Normocephalic and atraumatic.     Right Ear: Tympanic membrane, ear canal and external ear normal.     Left Ear: Tympanic membrane, ear canal and external ear normal.     Nose: No nasal deformity, septal deviation, mucosal edema, rhinorrhea or epistaxis.     Right Sinus: No maxillary sinus tenderness or frontal sinus tenderness.     Left Sinus: No maxillary sinus tenderness or frontal sinus tenderness.     Mouth/Throat:     Mouth: Oropharynx is clear and moist. Mucous membranes are not pale and not dry.     Pharynx: Uvula midline.  Eyes:     General:        Right eye: No discharge.        Left eye: No discharge.     Extraocular Movements: EOM normal.     Conjunctiva/sclera: Conjunctivae normal.     Right eye: Right conjunctiva is not  injected. No chemosis.    Left eye: Left conjunctiva is not injected. No chemosis.    Pupils: Pupils are equal, round, and reactive to light.  Cardiovascular:     Rate and Rhythm: Normal rate and regular rhythm.     Heart sounds: Normal heart sounds.  Pulmonary:     Effort: Pulmonary effort is normal. No tachypnea, accessory muscle usage or respiratory distress.     Breath sounds: Normal breath sounds. No wheezing, rhonchi or rales.  Chest:     Chest wall: No tenderness.  Lymphadenopathy:     Cervical: No cervical adenopathy.  Skin:    General: Skin is warm.     Capillary Refill: Capillary refill takes less than 2 seconds.     Coloration: Skin is not pale.     Findings: No abrasion, erythema, petechiae or rash. Rash is not papular, urticarial or vesicular.     Comments: No  excoriations noted.  Neurological:     Mental Status: She is alert.  Psychiatric:        Mood and Affect: Mood and affect normal.      Diagnostic studies: none    Salvatore Marvel, MD  [facility]

## 2020-12-20 ENCOUNTER — Ambulatory Visit (INDEPENDENT_AMBULATORY_CARE_PROVIDER_SITE_OTHER): Payer: Medicare HMO | Admitting: Internal Medicine

## 2020-12-20 ENCOUNTER — Encounter: Payer: Self-pay | Admitting: Internal Medicine

## 2020-12-20 ENCOUNTER — Telehealth: Payer: Self-pay | Admitting: Internal Medicine

## 2020-12-20 ENCOUNTER — Other Ambulatory Visit: Payer: Self-pay

## 2020-12-20 DIAGNOSIS — M7660 Achilles tendinitis, unspecified leg: Secondary | ICD-10-CM | POA: Insufficient documentation

## 2020-12-20 DIAGNOSIS — M7661 Achilles tendinitis, right leg: Secondary | ICD-10-CM | POA: Diagnosis not present

## 2020-12-20 DIAGNOSIS — N183 Chronic kidney disease, stage 3 unspecified: Secondary | ICD-10-CM

## 2020-12-20 DIAGNOSIS — M170 Bilateral primary osteoarthritis of knee: Secondary | ICD-10-CM

## 2020-12-20 DIAGNOSIS — R413 Other amnesia: Secondary | ICD-10-CM

## 2020-12-20 NOTE — Patient Instructions (Addendum)
Get a 1/4 inch heel lift Voltaren gel, ice  You can try [employer] capsules for memory, focus, neuropathy ( [email or web address])      Achilles Tendinitis  Achilles tendinitis is inflammation of the tough, cord-like band that attaches the lower leg muscles to the heel bone (Achilles tendon). This is usually caused by overusing the tendon and the ankle joint. Achilles tendinitis usually gets better over time with treatment and caring for yourself at home. It can take weeks or months to heal completely. What are the causes? This condition may be caused by:  A sudden increase in exercise or activity, such as running.  Doing the same exercises or activities, such as jumping, over and over.  Not warming up calf muscles before exercising.  Exercising in shoes that are worn out or not made for exercise.  Having arthritis or a bone growth (spur) on the back of the heel bone. This can rub against the tendon and hurt it.  Age-related wear and tear. Tendons become less flexible with age and are more likely to be injured. What are the signs or symptoms? Common symptoms of this condition include:  Pain in the Achilles tendon or in the back of the leg, just above the heel. The pain usually gets worse with exercise.  Stiffness or soreness in the back of the leg, especially in the morning.  Swelling of the skin over the Achilles tendon.  Thickening of the tendon.  Trouble standing on tiptoe. How is this diagnosed? This condition is diagnosed based on your symptoms and a physical exam. You may have tests, including:  X-rays.  MRI. How is this treated? The goal of treatment is to relieve symptoms and help your injury heal. Treatment may include:  Decreasing or stopping activities that caused the tendinitis. This may mean switching to low-impact exercises like biking or swimming.  Icing the injured area.  Doing physical therapy, including strengthening and stretching  exercises.  Taking NSAIDs, such as ibuprofen, to help relieve pain and swelling.  Using supportive shoes, wraps, heel lifts, or a walking boot (air cast).  Having surgery. This may be done if your symptoms do not improve after other treatments.  Using high-energy shock wave impulses to stimulate the healing process (extracorporeal shock wave therapy). This is rare.  Having an injection of medicines that help relieve inflammation (corticosteroids). This is rare. Follow these instructions at home: If you have an air cast:  Wear the air cast as told by your health care provider. Remove it only as told by your health care provider.  Loosen it if your toes tingle, become numb, or turn cold and blue.  Keep it clean.  If the air cast is not waterproof: ? Do not let it get wet. ? Cover it with a watertight covering when you take a bath or shower. Managing pain, stiffness, and swelling  If directed, put ice on the injured area. To do this: ? If you have a removable air cast, remove it as told by your health care provider. ? Put ice in a plastic bag. ? Place a towel between your skin and the bag. ? Leave the ice on for 20 minutes, 2-3 times a day.  Move your toes often to reduce stiffness and swelling.  Raise (elevate) your foot above the level of your heart while you are sitting or lying down.   Activity  Gradually return to your normal activities as told by your health care provider. Ask your health care provider  what activities are safe for you.  Do not do activities that cause pain.  Consider doing low-impact exercises, like cycling or swimming.  Ask your health care provider when it is safe to drive if you have an air cast on your foot.  If physical therapy was prescribed, do exercises as told by your health care provider or physical therapist. General instructions  If directed, wrap your foot with an elastic bandage or other wrap. This can help to keep your tendon from  moving too much while it heals. Your health care provider will show you how to wrap your foot correctly.  Wear supportive shoes or heel lifts only as told by your health care provider.  Take over-the-counter and prescription medicines only as told by your health care provider.  Keep all follow-up visits as told by your health care provider. This is important. Contact a health care provider if you:  Have symptoms that get worse.  Have pain that does not get better with medicine.  Develop new, unexplained symptoms.  Develop warmth and swelling in your foot.  Have a fever. Get help right away if you:  Have a sudden popping sound or sensation in your Achilles tendon followed by severe pain.  Cannot move your toes or foot.  Cannot put any weight on your foot.  Your foot or toes become numb and look white or blue even after loosening your bandage or air cast. Summary  Achilles tendinitis is inflammation of the tough, cord-like band that attaches the lower leg muscles to the heel bone (Achilles tendon).  This condition is usually caused by overusing the tendon and the ankle joint. It can also be caused by arthritis or normal aging.  The most common symptoms of this condition include pain, swelling, or stiffness in the Achilles tendon or in the back of the leg.  This condition is usually treated by decreasing or stopping activities that caused the tendinitis, icing the injured area, taking NSAIDs, and doing physical therapy. This information is not intended to replace advice given to you by your health care provider. Make sure you discuss any questions you have with your health care provider. Document Revised: 03/16/2019 Document Reviewed: 03/16/2019 Elsevier Patient Education  [employer].

## 2020-12-20 NOTE — Assessment & Plan Note (Signed)
mild Try [employer]

## 2020-12-20 NOTE — Telephone Encounter (Signed)
   Patient requesting letter to provide [employer] to be excused from jury duty 01/24/21 Juror # [phone] Patient states she recently had knee surgery, and jury duty would be hard for her to walk to locations in [place]

## 2020-12-20 NOTE — Assessment & Plan Note (Signed)
Treat HTN

## 2020-12-20 NOTE — Progress Notes (Signed)
Subjective:  Patient ID: Jane Mooney, female    DOB: 08-12-1951  Age: 70 y.o. MRN: [id number]  CC: Follow-up (2 MONTH F/U)   HPI [name] presents for L knee TKR, keloid - better C/o R achilles tendon pain x weeks  Outpatient Medications Prior to Visit  Medication Sig Dispense Refill   acetaminophen (TYLENOL) 500 MG tablet Take 1,000 mg by mouth every 6 (six) hours as needed for moderate pain or headache.     amLODipine (NORVASC) 5 MG tablet Take 1 tablet (5 mg total) by mouth daily. 90 tablet 3   Capsaicin (ZOSTRIX-HP) 0.075 % STCK Use qid 1 g 1   cetirizine (ZYRTEC) 10 MG tablet TAKE 1 TABLET BY MOUTH TWICE A DAY 180 tablet 0   cholecalciferol (VITAMIN D) 1000 units tablet Take 2 tablets (2,000 Units total) by mouth daily. (Patient taking differently: Take 1,000 Units by mouth daily.) 100 tablet 11   Cyanocobalamin (VITAMIN B-12) 1000 MCG SUBL Place 1 tablet (1,000 mcg total) under the tongue daily. 100 tablet 3   famotidine (PEPCID) 20 MG tablet TAKE 1 TABLET BY MOUTH TWICE A DAY 180 tablet 1   fish oil-omega-3 fatty acids 1000 MG capsule Take 1 g by mouth daily.     fluticasone (FLONASE) 50 MCG/ACT nasal spray Place 1 spray into both nostrils daily. (Patient taking differently: Place 1 spray into both nostrils daily as needed for allergies.) 16 g 3   gabapentin (NEURONTIN) 100 MG capsule Take 1-2 capsules (100-200 mg total) by mouth in the morning, at noon, and at bedtime. 180 capsule 3   hydrOXYzine (ATARAX/VISTARIL) 25 MG tablet Take 2 tablets (50 mg total) by mouth at bedtime. 60 tablet 5   LORazepam (ATIVAN) 1 MG tablet Take 1 tablet (1 mg total) by mouth 2 (two) times daily as needed for anxiety. 60 tablet 3   Polyethylene Glycol 3350 (PEG 3350) 17 GM/SCOOP POWD TAKE 17 G BY MOUTH 2 (TWO) TIMES DAILY AS NEEDED. 510 g 1   telmisartan (MICARDIS) 80 MG tablet Take 1 tablet (80 mg total) by mouth daily. 90 tablet 3   triamcinolone ointment (KENALOG) 0.1 % Apply  1 application topically 4 (four) times daily. (Patient taking differently: Apply 1 application topically 4 (four) times daily as needed (hemorrhoids).) 80 g 1   triamterene-hydrochlorothiazide (MAXZIDE-25) 37.5-25 MG tablet Take 0.5 tablets by mouth daily. 90 tablet 1   HYDROcodone-acetaminophen (NORCO/VICODIN) 5-325 MG tablet Take 1 tablet by mouth every 6 (six) hours as needed for severe pain. (Patient not taking: Reported on 12/20/2020) 30 tablet 0   No facility-administered medications prior to visit.    ROS: Review of Systems  Constitutional: Negative for activity change, appetite change, chills, fatigue and unexpected weight change.  HENT: Negative for congestion, mouth sores and sinus pressure.   Eyes: Negative for visual disturbance.  Respiratory: Negative for cough and chest tightness.   Gastrointestinal: Negative for abdominal pain and nausea.  Genitourinary: Negative for difficulty urinating, frequency and vaginal pain.  Musculoskeletal: Positive for arthralgias and gait problem. Negative for back pain.  Skin: Negative for pallor and rash.  Neurological: Negative for dizziness, tremors, weakness, numbness and headaches.  Psychiatric/Behavioral: Negative for confusion and sleep disturbance.    Objective:  BP 130/72 (BP Location: Left Arm)    Pulse 61    Temp 98.9 F (37.2 C) (Oral)    Ht 5\' 3"  (1.6 m)    Wt 172 lb 12.8 oz (78.4 kg)    SpO2  98%    BMI 30.61 kg/m   BP Readings from Last 3 Encounters:  12/20/20 130/72  12/13/20 122/60  10/19/20 (!) 142/80    Wt Readings from Last 3 Encounters:  12/20/20 172 lb 12.8 oz (78.4 kg)  10/19/20 175 lb 9.6 oz (79.7 kg)  09/20/20 171 lb 8.3 oz (77.8 kg)    Physical Exam Constitutional:      General: She is not in acute distress.    Appearance: She is well-developed. She is obese.  HENT:     Head: Normocephalic.     Right Ear: External ear normal.     Left Ear: External ear normal.     Nose: Nose normal.     Mouth/Throat:      Mouth: Oropharynx is clear and moist.  Eyes:     General:        Right eye: No discharge.        Left eye: No discharge.     Conjunctiva/sclera: Conjunctivae normal.     Pupils: Pupils are equal, round, and reactive to light.  Neck:     Thyroid: No thyromegaly.     Vascular: No JVD.     Trachea: No tracheal deviation.  Cardiovascular:     Rate and Rhythm: Normal rate and regular rhythm.     Heart sounds: Normal heart sounds.  Pulmonary:     Effort: No respiratory distress.     Breath sounds: No stridor. No wheezing.  Abdominal:     General: Bowel sounds are normal. There is no distension.     Palpations: Abdomen is soft. There is no mass.     Tenderness: There is no abdominal tenderness. There is no guarding or rebound.  Musculoskeletal:        General: Tenderness present. No edema.     Cervical back: Normal range of motion and neck supple.  Lymphadenopathy:     Cervical: No cervical adenopathy.  Skin:    Findings: No erythema or rash.  Neurological:     Mental Status: She is oriented to person, place, and time.     Cranial Nerves: No cranial nerve deficit.     Motor: No abnormal muscle tone.     Coordination: Coordination normal.     Deep Tendon Reflexes: Reflexes normal.  Psychiatric:        Mood and Affect: Mood and affect normal.        Behavior: Behavior normal.        Thought Content: Thought content normal.        Judgment: Judgment normal.    L knee w/a scar - less keloid, NT R achilles tendon - painful  Lab Results  Component Value Date   WBC 4.8 06/23/2020   HGB 12.5 06/23/2020   HCT 39.9 06/23/2020   PLT 222 06/23/2020   GLUCOSE 94 09/20/2020   CHOL 243 (H) 09/23/2019   TRIG 167.0 (H) 09/23/2019   HDL 40.70 09/23/2019   LDLDIRECT 153.6 07/06/2013   LDLCALC 169 (H) 09/23/2019   ALT 7 09/20/2020   AST 14 09/20/2020   NA 139 09/20/2020   K 4.3 09/20/2020   CL 101 09/20/2020   CREATININE 0.96 09/20/2020   BUN 16 09/20/2020   CO2 30 09/20/2020    TSH 1.71 09/23/2019   INR 1.0 06/23/2020   HGBA1C 5.8 09/20/2020    No results found.  Assessment & Plan:    [name], MD

## 2020-12-20 NOTE — Assessment & Plan Note (Addendum)
Better; keloid is better, nerve pain is better - off Gabapentin

## 2020-12-20 NOTE — Assessment & Plan Note (Signed)
R side Get a 1/4 inch heel lift Voltaren gel

## 2020-12-22 ENCOUNTER — Ambulatory Visit: Payer: Medicare HMO | Admitting: Allergy & Immunology

## 2020-12-23 NOTE — Telephone Encounter (Signed)
Lorre Nick, Please right [employer].  Thanks

## 2020-12-27 NOTE — Telephone Encounter (Signed)
Generated letter.. notified pt letter ready for pick-up.Marland KitchenJohny Mooney

## 2021-01-04 ENCOUNTER — Other Ambulatory Visit: Payer: Self-pay | Admitting: Allergy & Immunology

## 2021-02-03 IMAGING — DX DG KNEE COMPLETE 4+V*L*
5 series · 5 of 5 positions shown · non-contrast
Comparison: MRI dated 03/15/2007

CLINICAL DATA: Left knee pain.

EXAM:
LEFT KNEE - COMPLETE 4+ VIEW

[knee ap]
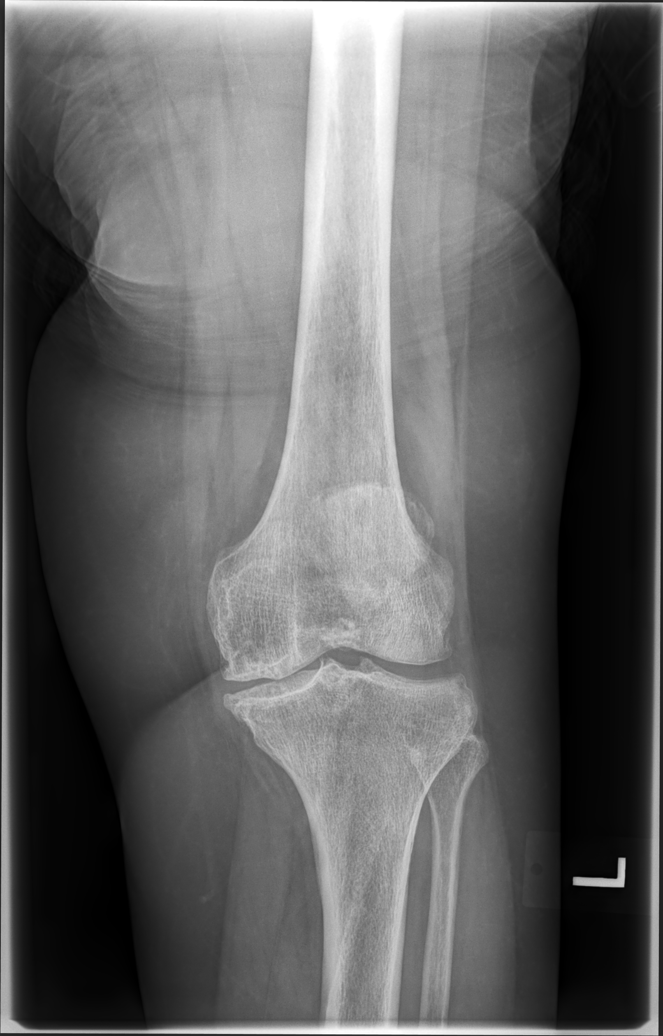

[knee lat]
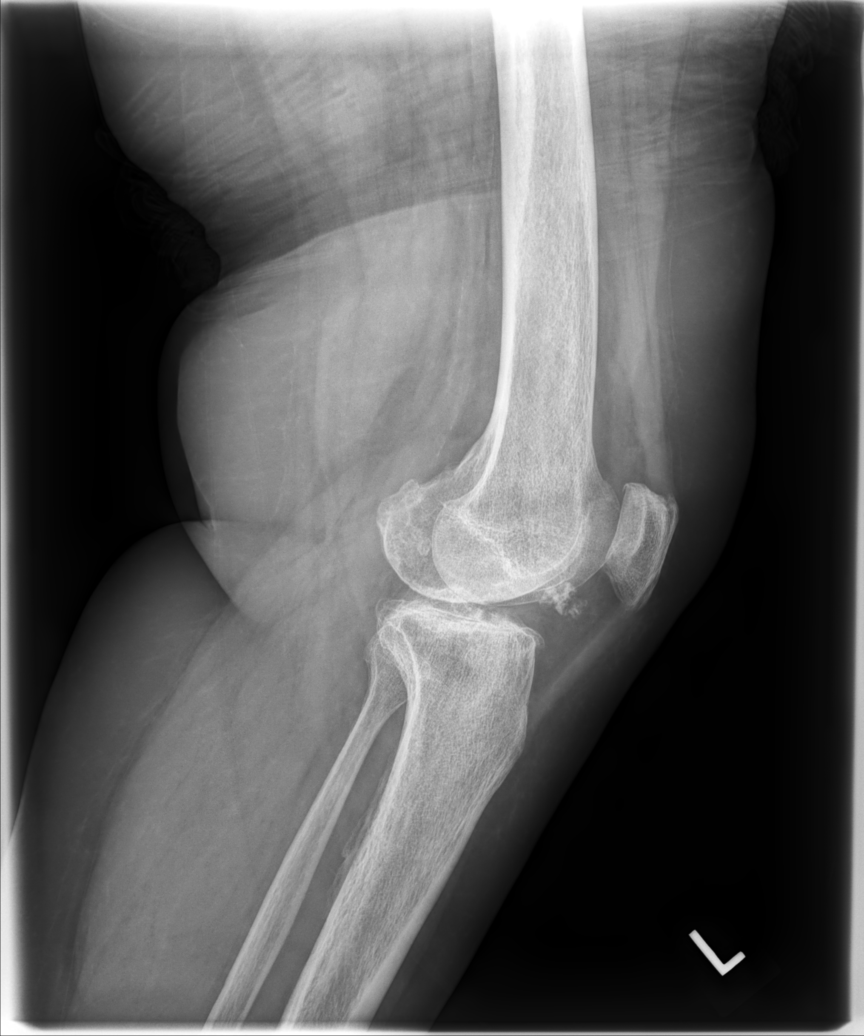

[patella (sunrise) tan]
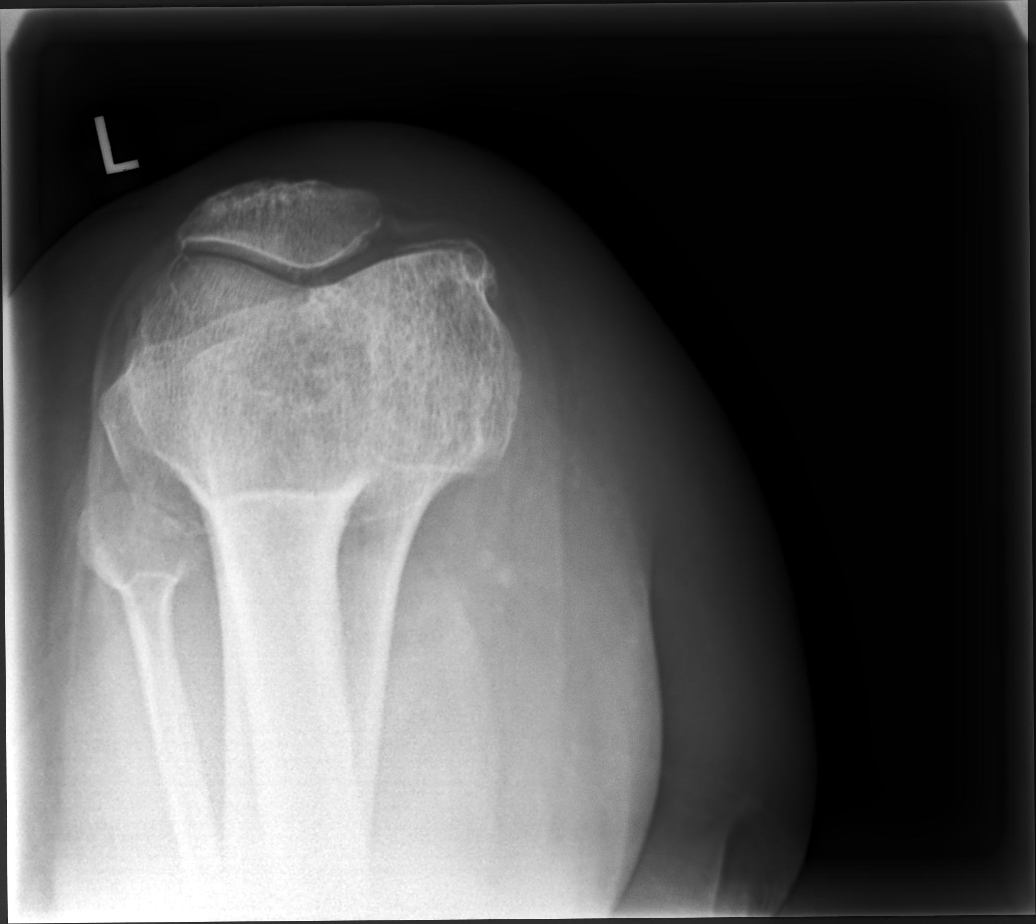

[knee mlo (1 of 2)]
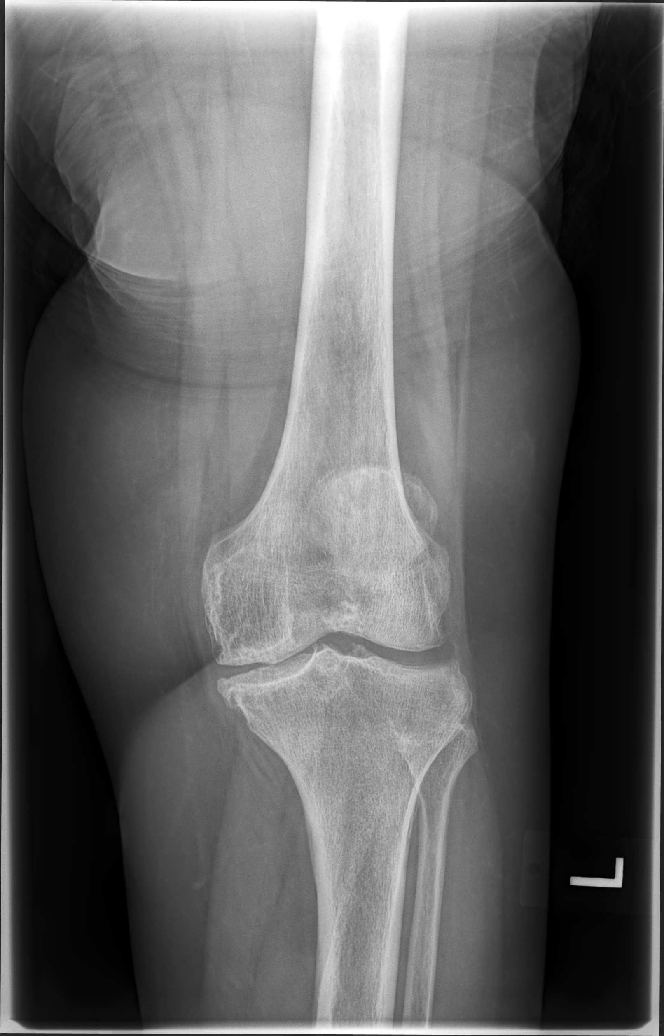

[knee mlo (2 of 2)]
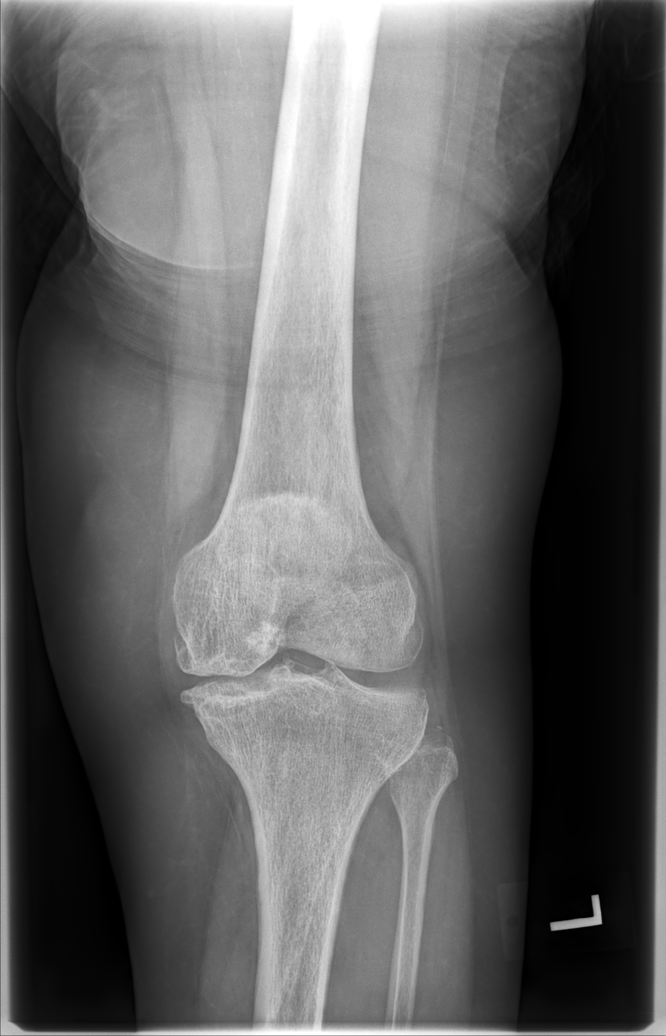

[5 of 5 positions shown; findings below may reference images not displayed]

FINDINGS: There is moderately severe arthritis of the medial compartment.
Calcified loose body in the joint in Hoffa's fat pad adjacent
Hoffa's fat pad. Slight arthritis of the patellofemoral compartment.
Small loose body in the posterior aspect of the joint. No effusion.
IMPRESSION: Moderately severe osteoarthritis of the medial compartment of the
left knee. Loose bodies in the joint.

## 2021-02-08 ENCOUNTER — Telehealth: Payer: Self-pay | Admitting: Internal Medicine

## 2021-02-08 NOTE — Telephone Encounter (Signed)
Patient called in today saying she is getting cramps in her groins while walking. She requested to only see Dr.Plot. She made an appointment for April 7th since that was the next available but she wants to know if she would need to come in sooner

## 2021-02-10 ENCOUNTER — Ambulatory Visit (INDEPENDENT_AMBULATORY_CARE_PROVIDER_SITE_OTHER): Payer: Medicare HMO

## 2021-02-10 ENCOUNTER — Encounter: Payer: Self-pay | Admitting: Internal Medicine

## 2021-02-10 ENCOUNTER — Ambulatory Visit (INDEPENDENT_AMBULATORY_CARE_PROVIDER_SITE_OTHER): Payer: Medicare HMO | Admitting: Internal Medicine

## 2021-02-10 ENCOUNTER — Other Ambulatory Visit: Payer: Self-pay

## 2021-02-10 VITALS — BP 140/62 | HR 67 | Temp 99.1°F | Ht 63.0 in | Wt 175.8 lb

## 2021-02-10 DIAGNOSIS — M25551 Pain in right hip: Secondary | ICD-10-CM

## 2021-02-10 DIAGNOSIS — M24159 Other articular cartilage disorders, unspecified hip: Secondary | ICD-10-CM | POA: Diagnosis not present

## 2021-02-10 LAB — COMPREHENSIVE METABOLIC PANEL
ALT: 8 U/L (ref 0–35)
AST: 15 U/L (ref 0–37)
Albumin: 4.4 g/dL (ref 3.5–5.2)
Alkaline Phosphatase: 77 U/L (ref 39–117)
BUN: 20 mg/dL (ref 6–23)
CO2: 28 mEq/L (ref 19–32)
Calcium: 9.7 mg/dL (ref 8.4–10.5)
Chloride: 104 mEq/L (ref 96–112)
Creatinine, Ser: 1.06 mg/dL (ref 0.40–1.20)
GFR: 52.82 mL/min — ABNORMAL LOW (ref >60.00)
Glucose, Bld: 113 mg/dL — ABNORMAL HIGH (ref 70–99)
Potassium: 4 mEq/L (ref 3.5–5.1)
Sodium: 141 mEq/L (ref 135–145)
Total Bilirubin: 0.5 mg/dL (ref 0.2–1.2)
Total Protein: 7.7 g/dL (ref 6.0–8.3)

## 2021-02-10 LAB — MAGNESIUM: Magnesium: 2 mg/dL (ref 1.5–2.5)

## 2021-02-10 LAB — CK: Total CK: 186 U/L — ABNORMAL HIGH (ref 7–177)

## 2021-02-10 MED ORDER — MELOXICAM 15 MG PO TABS: 15.0000 mg | ORAL_TABLET | Freq: Every day | ORAL | 0 refills | Status: DC

## 2021-02-10 NOTE — Patient Instructions (Addendum)
We will check the labs and the x-ray.  We have sent in meloxicam which is an anti-inflammatory medicine to take daily for 1-2 weeks.

## 2021-02-10 NOTE — Progress Notes (Signed)
   Subjective:   Patient ID: Jane Mooney, female    DOB: Mar 06, 1951, 70 y.o.   MRN: [id number]  HPI The patient is a 70 YO female coming in for cramping in the right groin with walking. Happened once and then again yesterday. Started 3 weeks ago or so. Denies pain outside of the cramps except in the last day or so. Having mild constant pain in the right hip since yesterday. Overall improving. Has tried tylenol which she is not sure helped and muscle relaxer initially she is not sure helped.    Review of Systems  Constitutional: Negative.   HENT: Negative.   Eyes: Negative.   Respiratory: Negative for cough, chest tightness and shortness of breath.   Cardiovascular: Negative for chest pain, palpitations and leg swelling.  Gastrointestinal: Negative for abdominal distention, abdominal pain, constipation, diarrhea, nausea and vomiting.  Musculoskeletal: Positive for arthralgias.  Skin: Negative.   Neurological: Negative.   Psychiatric/Behavioral: Negative.     Objective:  Physical Exam Constitutional:      Appearance: She is well-developed.  HENT:     Head: Normocephalic and atraumatic.  Cardiovascular:     Rate and Rhythm: Normal rate and regular rhythm.  Pulmonary:     Effort: Pulmonary effort is normal. No respiratory distress.     Breath sounds: Normal breath sounds. No wheezing or rales.  Abdominal:     General: Bowel sounds are normal. There is no distension.     Palpations: Abdomen is soft.     Tenderness: There is no abdominal tenderness. There is no rebound.  Musculoskeletal:     Cervical back: Normal range of motion.  Skin:    General: Skin is warm and dry.  Neurological:     Mental Status: She is alert and oriented to person, place, and time.     Coordination: Coordination normal.     Vitals:   02/10/21 0819  BP: 140/62  Pulse: 67  Temp: 99.1 F (37.3 C)  TempSrc: Oral  SpO2: 99%  Weight: 175 lb 12.8 oz (79.7 kg)  Height: 5\' 3"  (1.6 m)    This visit  occurred during the SARS-CoV-2 public health emergency.  Safety protocols were in place, including screening questions prior to the visit, additional usage of staff PPE, and extensive cleaning of exam room while observing appropriate contact time as indicated for disinfecting solutions.   Assessment & Plan:

## 2021-02-10 NOTE — Telephone Encounter (Signed)
Pt saw Dr. Sharlet Salina this am for sxs.Marland KitchenJohny Chess

## 2021-02-10 NOTE — Telephone Encounter (Signed)
Try over-the-counter magnesium tablets for cramps. Keep office visit.  Schedule with any available provider if worse.  Thanks

## 2021-02-10 NOTE — Assessment & Plan Note (Signed)
Checking x-ray right hip to assess for arthritis. Rx meloxicam for anti-inflammatory. Checking CK and CMP and magnesium.

## 2021-02-16 ENCOUNTER — Ambulatory Visit: Payer: Medicare HMO | Admitting: Internal Medicine

## 2021-03-09 ENCOUNTER — Other Ambulatory Visit: Payer: Self-pay | Admitting: Internal Medicine

## 2021-03-15 DIAGNOSIS — Z6830 Body mass index (BMI) 30.0-30.9, adult: Secondary | ICD-10-CM | POA: Diagnosis not present

## 2021-03-15 DIAGNOSIS — Z1231 Encounter for screening mammogram for malignant neoplasm of breast: Secondary | ICD-10-CM | POA: Diagnosis not present

## 2021-03-15 DIAGNOSIS — Z01419 Encounter for gynecological examination (general) (routine) without abnormal findings: Secondary | ICD-10-CM | POA: Diagnosis not present

## 2021-03-20 ENCOUNTER — Other Ambulatory Visit: Payer: Self-pay

## 2021-03-21 ENCOUNTER — Ambulatory Visit (INDEPENDENT_AMBULATORY_CARE_PROVIDER_SITE_OTHER): Payer: Medicare HMO | Admitting: Internal Medicine

## 2021-03-21 ENCOUNTER — Encounter: Payer: Self-pay | Admitting: Internal Medicine

## 2021-03-21 ENCOUNTER — Other Ambulatory Visit: Payer: Self-pay

## 2021-03-21 DIAGNOSIS — M170 Bilateral primary osteoarthritis of knee: Secondary | ICD-10-CM

## 2021-03-21 DIAGNOSIS — F4321 Adjustment disorder with depressed mood: Secondary | ICD-10-CM

## 2021-03-21 DIAGNOSIS — E538 Deficiency of other specified B group vitamins: Secondary | ICD-10-CM | POA: Diagnosis not present

## 2021-03-21 DIAGNOSIS — M25561 Pain in right knee: Secondary | ICD-10-CM

## 2021-03-21 DIAGNOSIS — N183 Chronic kidney disease, stage 3 unspecified: Secondary | ICD-10-CM

## 2021-03-21 DIAGNOSIS — E559 Vitamin D deficiency, unspecified: Secondary | ICD-10-CM | POA: Diagnosis not present

## 2021-03-21 MED ORDER — METHYLPREDNISOLONE ACETATE 40 MG/ML IJ SUSP
40.0000 mg | Freq: Once | INTRAMUSCULAR | Status: AC
  Administered 2021-03-21: 40 mg via INTRAMUSCULAR

## 2021-03-21 MED ORDER — VENLAFAXINE HCL ER 37.5 MG PO CP24: 37.5000 mg | ORAL_CAPSULE | Freq: Every day | ORAL | 5 refills | Status: DC

## 2021-03-21 MED ORDER — LIDOCAINE-EPINEPHRINE 2 %-1:100000 IJ SOLN
3.0000 mL | Freq: Once | INTRAMUSCULAR | Status: AC
  Administered 2021-03-21: 3 mL

## 2021-03-21 NOTE — Assessment & Plan Note (Addendum)
Worse Start Effexor XR

## 2021-03-21 NOTE — Progress Notes (Signed)
Subjective:  Patient ID: Jane Mooney, female    DOB: 1950-12-06  Age: 70 y.o. MRN: [id number]  CC: Follow-up (3 month f/u)   HPI JERRYE SEEBECK presents for R knee pain - worse 0-5-8/10 at times S/p L knee pain post-op - better C/o depressed mood F/u on OA   Outpatient Medications Prior to Visit  Medication Sig Dispense Refill  . acetaminophen (TYLENOL) 500 MG tablet Take 1,000 mg by mouth every 6 (six) hours as needed for moderate pain or headache.    [name] amLODipine (NORVASC) 5 MG tablet Take 1 tablet (5 mg total) by mouth daily. 90 tablet 3  . Capsaicin (ZOSTRIX-HP) 0.075 % STCK Use qid 1 g 1  . cetirizine (ZYRTEC) 10 MG tablet TAKE 1 TABLET BY MOUTH TWICE A DAY 180 tablet 0  . cholecalciferol (VITAMIN D) 1000 units tablet Take 2 tablets (2,000 Units total) by mouth daily. (Patient taking differently: Take 1,000 Units by mouth daily.) 100 tablet 11  . Cyanocobalamin (VITAMIN B-12) 1000 MCG SUBL Place 1 tablet (1,000 mcg total) under the tongue daily. 100 tablet 3  . famotidine (PEPCID) 20 MG tablet TAKE 1 TABLET BY MOUTH TWICE A DAY 180 tablet 1  . fish oil-omega-3 fatty acids 1000 MG capsule Take 1 g by mouth daily.    . fluticasone (FLONASE) 50 MCG/ACT nasal spray Place 1 spray into both nostrils daily. (Patient taking differently: Place 1 spray into both nostrils daily as needed for allergies.) 16 g 3  . gabapentin (NEURONTIN) 100 MG capsule Take 1-2 capsules (100-200 mg total) by mouth in the morning, at noon, and at bedtime. 180 capsule 3  . hydrOXYzine (ATARAX/VISTARIL) 25 MG tablet Take 1 tablet twice a day as needed    . LORazepam (ATIVAN) 1 MG tablet Take 1 tablet (1 mg total) by mouth 2 (two) times daily as needed for anxiety. 60 tablet 3  . meloxicam (MOBIC) 15 MG tablet Take 1 tablet (15 mg total) by mouth daily. 30 tablet 0  . Polyethylene Glycol 3350 (PEG 3350) 17 GM/SCOOP POWD TAKE 17 G BY MOUTH 2 (TWO) TIMES DAILY AS NEEDED. 510 g 1  . telmisartan (MICARDIS) 80 MG  tablet Take 1 tablet (80 mg total) by mouth daily. 90 tablet 3  . triamcinolone ointment (KENALOG) 0.1 % Apply 1 application topically 4 (four) times daily. (Patient taking differently: Apply 1 application topically 4 (four) times daily as needed (hemorrhoids).) 80 g 1  . triamterene-hydrochlorothiazide (MAXZIDE-25) 37.5-25 MG tablet Take 0.5 tablets by mouth daily. 90 tablet 1   No facility-administered medications prior to visit.    ROS: Review of Systems  Constitutional: Positive for fatigue. Negative for activity change, appetite change, chills and unexpected weight change.  HENT: Negative for congestion, mouth sores and sinus pressure.   Eyes: Negative for visual disturbance.  Respiratory: Negative for cough and chest tightness.   Gastrointestinal: Negative for abdominal pain and nausea.  Genitourinary: Negative for difficulty urinating, frequency and vaginal pain.  Musculoskeletal: Positive for arthralgias and gait problem. Negative for back pain.  Skin: Negative for pallor and rash.  Neurological: Negative for dizziness, tremors, weakness, numbness and headaches.  Psychiatric/Behavioral: Positive for dysphoric mood. Negative for confusion, sleep disturbance and suicidal ideas. The patient is nervous/anxious.     Objective:  BP 128/72 (BP Location: Left Arm)   Pulse 67   Temp 98.6 F (37 C) (Oral)   Ht 5\' 3"  (1.6 m)   Wt 172 lb 12.8 oz (78.4 kg)  SpO2 98%   BMI 30.61 kg/m   BP Readings from Last 3 Encounters:  03/21/21 128/72  02/10/21 140/62  12/20/20 130/72    Wt Readings from Last 3 Encounters:  03/21/21 172 lb 12.8 oz (78.4 kg)  02/10/21 175 lb 12.8 oz (79.7 kg)  12/20/20 172 lb 12.8 oz (78.4 kg)    Physical Exam Constitutional:      General: She is not in acute distress.    Appearance: She is well-developed. She is obese.  HENT:     Head: Normocephalic.     Right Ear: External ear normal.     Left Ear: External ear normal.     Nose: Nose normal.  Eyes:      General:        Right eye: No discharge.        Left eye: No discharge.     Conjunctiva/sclera: Conjunctivae normal.     Pupils: Pupils are equal, round, and reactive to light.  Neck:     Thyroid: No thyromegaly.     Vascular: No JVD.     Trachea: No tracheal deviation.  Cardiovascular:     Rate and Rhythm: Normal rate and regular rhythm.     Heart sounds: Normal heart sounds.  Pulmonary:     Effort: No respiratory distress.     Breath sounds: No stridor. No wheezing.  Abdominal:     General: Bowel sounds are normal. There is no distension.     Palpations: Abdomen is soft. There is no mass.     Tenderness: There is no abdominal tenderness. There is no guarding or rebound.  Musculoskeletal:        General: Tenderness present.     Cervical back: Normal range of motion and neck supple.  Lymphadenopathy:     Cervical: No cervical adenopathy.  Skin:    Findings: No erythema or rash.  Neurological:     Mental Status: She is oriented to person, place, and time.     Cranial Nerves: No cranial nerve deficit.     Motor: No abnormal muscle tone.     Coordination: Coordination normal.     Deep Tendon Reflexes: Reflexes normal.  Psychiatric:        Behavior: Behavior normal.        Thought Content: Thought content normal.        Judgment: Judgment normal.    R  Knee b anserina - very tender tearful  Procedure Note :     Procedure : Joint Injection, R  Knee b anserina   Indication:  Joint bursitis with refractory  chronic pain.   Risks including unsuccessful procedure , bleeding, infection, bruising, skin atrophy, "steroid flare-up" and others were explained to the patient in detail as well as the benefits. Informed consent was obtained verbally.  Tthe patient was placed in a comfortable position. Skin was prepped with Betadine and alcohol  and anesthetized a cooling spray. Then, a 5 cc syringe with a 1.5 inch long 25-gauge needle was used for a joint injection.. The needle was  advanced  Into the bursa. I injected the bursa with 3 mL of 2% lidocaine and 40 mg of Depo-Medrol .  Band-Aid was applied.   Tolerated well. Complications: None. Good pain relief following the procedure.   Postprocedure instructions :    A Band-Aid should be left on for 12 hours. Injection therapy is not a cure itself. It is used in conjunction with other modalities. You can use nonsteroidal anti-inflammatories like  ibuprofen , hot and cold compresses. Rest is recommended in the next 24 hours. You need to report immediately  if fever, chills or any signs of infection develop.   Lab Results  Component Value Date   WBC 4.8 06/23/2020   HGB 12.5 06/23/2020   HCT 39.9 06/23/2020   PLT 222 06/23/2020   GLUCOSE 113 (H) 02/10/2021   CHOL 243 (H) 09/23/2019   TRIG 167.0 (H) 09/23/2019   HDL 40.70 09/23/2019   LDLDIRECT 153.6 07/06/2013   LDLCALC 169 (H) 09/23/2019   ALT 8 02/10/2021   AST 15 02/10/2021   NA 141 02/10/2021   K 4.0 02/10/2021   CL 104 02/10/2021   CREATININE 1.06 02/10/2021   BUN 20 02/10/2021   CO2 28 02/10/2021   TSH 1.71 09/23/2019   INR 1.0 06/23/2020   HGBA1C 5.8 09/20/2020    No results found.  Assessment & Plan:    [name], MD

## 2021-03-21 NOTE — Assessment & Plan Note (Signed)
On vit D 

## 2021-03-21 NOTE — Assessment & Plan Note (Signed)
On B12 

## 2021-03-21 NOTE — Assessment & Plan Note (Addendum)
s/p L TKR -- Better; keloid is better, nerve pain is better  R knee pain - worse Try TENs unit

## 2021-03-21 NOTE — Assessment & Plan Note (Signed)
Avoid NSAIDs 

## 2021-03-29 ENCOUNTER — Other Ambulatory Visit: Payer: Self-pay

## 2021-03-29 ENCOUNTER — Other Ambulatory Visit (HOSPITAL_BASED_OUTPATIENT_CLINIC_OR_DEPARTMENT_OTHER): Payer: Self-pay

## 2021-03-29 ENCOUNTER — Ambulatory Visit: Payer: Medicare HMO | Attending: Internal Medicine

## 2021-03-29 DIAGNOSIS — Z23 Encounter for immunization: Secondary | ICD-10-CM

## 2021-03-29 MED ORDER — PFIZER-BIONT COVID-19 VAC-TRIS 30 MCG/0.3ML IM SUSP
INTRAMUSCULAR | 0 refills | Status: DC
  Filled 2021-03-29: qty 0.3, 1d supply, fill #0

## 2021-03-29 NOTE — Progress Notes (Signed)
   Covid-19 Vaccination [facility]  Name:  Jane Mooney    MRN: [id number] DOB: Jul 07, 1951  03/29/2021  [name] was observed post Covid-19 immunization for 15 minutes without incident. She was provided with Vaccine Information Sheet and instruction to access the V-Safe system.   [name] was instructed to call 911 with any severe reactions post vaccine: [name] Difficulty breathing  . Swelling of face and throat  . A fast heartbeat  . A bad rash all over body  . Dizziness and weakness   Immunizations Administered    Name Date Dose VIS Date Route   PFIZER Comrnaty(Gray TOP) Covid-19 Vaccine 03/29/2021 12:30 PM 0.3 mL 10/20/2020 Intramuscular   Manufacturer: [place]   Lot: [id number]   NDC: [phone]

## 2021-04-12 ENCOUNTER — Other Ambulatory Visit: Payer: Self-pay | Admitting: Internal Medicine

## 2021-04-21 ENCOUNTER — Other Ambulatory Visit: Payer: Self-pay | Admitting: Internal Medicine

## 2021-04-21 NOTE — Telephone Encounter (Signed)
Requesting: Lorazepam Last Visit: 03/21/21 Next Visit: 05/02/21 Last Filled: 03/08/21  PMP done; please advise

## 2021-05-02 ENCOUNTER — Ambulatory Visit (INDEPENDENT_AMBULATORY_CARE_PROVIDER_SITE_OTHER): Payer: Medicare HMO | Admitting: Internal Medicine

## 2021-05-02 ENCOUNTER — Encounter: Payer: Self-pay | Admitting: Internal Medicine

## 2021-05-02 ENCOUNTER — Ambulatory Visit (INDEPENDENT_AMBULATORY_CARE_PROVIDER_SITE_OTHER): Payer: Medicare HMO

## 2021-05-02 ENCOUNTER — Other Ambulatory Visit: Payer: Self-pay

## 2021-05-02 VITALS — BP 130/72 | HR 59 | Temp 98.6°F | Ht 63.0 in | Wt 174.0 lb

## 2021-05-02 DIAGNOSIS — E049 Nontoxic goiter, unspecified: Secondary | ICD-10-CM | POA: Diagnosis not present

## 2021-05-02 DIAGNOSIS — E785 Hyperlipidemia, unspecified: Secondary | ICD-10-CM | POA: Diagnosis not present

## 2021-05-02 DIAGNOSIS — I10 Essential (primary) hypertension: Secondary | ICD-10-CM | POA: Diagnosis not present

## 2021-05-02 DIAGNOSIS — N183 Chronic kidney disease, stage 3 unspecified: Secondary | ICD-10-CM | POA: Diagnosis not present

## 2021-05-02 DIAGNOSIS — R059 Cough, unspecified: Secondary | ICD-10-CM | POA: Diagnosis not present

## 2021-05-02 MED ORDER — FAMOTIDINE 20 MG PO TABS
20.0000 mg | ORAL_TABLET | Freq: Every day | ORAL | 3 refills | Status: DC
Start: 2021-05-02 — End: 2022-05-18

## 2021-05-02 MED ORDER — SHINGRIX 50 MCG/0.5ML IM SUSR: 0.5000 mL | Freq: Once | INTRAMUSCULAR | 1 refills | Status: AC

## 2021-05-02 NOTE — Progress Notes (Signed)
Subjective:  Patient ID: Jane Mooney, female    DOB: 04/29/1951  Age: 70 y.o. MRN: [id number]  CC: Follow-up (6 week f/u)   HPI Jane Mooney presents for OA, HTN R knee pain is better after the shot C/o cough at night - not taking Pepcid...  Outpatient Medications Prior to Visit  Medication Sig Dispense Refill   acetaminophen (TYLENOL) 500 MG tablet Take 1,000 mg by mouth every 6 (six) hours as needed for moderate pain or headache.     amLODipine (NORVASC) 5 MG tablet Take 1 tablet (5 mg total) by mouth daily. 90 tablet 3   cetirizine (ZYRTEC) 10 MG tablet TAKE 1 TABLET BY MOUTH TWICE A DAY 180 tablet 0   cholecalciferol (VITAMIN D) 1000 units tablet Take 2 tablets (2,000 Units total) by mouth daily. (Patient taking differently: Take 1,000 Units by mouth daily.) 100 tablet 11   Cyanocobalamin (VITAMIN B-12) 1000 MCG SUBL Place 1 tablet (1,000 mcg total) under the tongue daily. 100 tablet 3   fish oil-omega-3 fatty acids 1000 MG capsule Take 1 g by mouth daily.     fluticasone (FLONASE) 50 MCG/ACT nasal spray Place 1 spray into both nostrils daily. (Patient taking differently: Place 1 spray into both nostrils daily as needed for allergies.) 16 g 3   hydrOXYzine (ATARAX/VISTARIL) 25 MG tablet Take 1 tablet twice a day as needed     LORazepam (ATIVAN) 1 MG tablet TAKE 1 TABLET BY MOUTH 2 TIMES DAILY AS NEEDED FOR ANXIETY. 60 tablet 3   Polyethylene Glycol 3350 (PEG 3350) 17 GM/SCOOP POWD TAKE 17 G BY MOUTH 2 (TWO) TIMES DAILY AS NEEDED. 510 g 1   telmisartan (MICARDIS) 80 MG tablet Take 1 tablet (80 mg total) by mouth daily. 90 tablet 3   triamcinolone ointment (KENALOG) 0.1 % Apply 1 application topically 4 (four) times daily. (Patient taking differently: Apply 1 application topically 4 (four) times daily as needed (hemorrhoids).) 80 g 1   triamterene-hydrochlorothiazide (MAXZIDE-25) 37.5-25 MG tablet Take 0.5 tablets by mouth daily. 90 tablet 1   venlafaxine XR (EFFEXOR-XR) 37.5 MG 24  hr capsule TAKE 1 CAPSULE BY MOUTH DAILY WITH BREAKFAST. 90 capsule 3   Capsaicin (ZOSTRIX-HP) 0.075 % STCK Use qid (Patient not taking: Reported on 05/02/2021) 1 g 1   COVID-19 mRNA Vac-TriS, Pfizer, (PFIZER-BIONT COVID-19 VAC-TRIS) SUSP injection Inject into the muscle. (Patient not taking: Reported on 05/02/2021) 0.3 mL 0   famotidine (PEPCID) 20 MG tablet TAKE 1 TABLET BY MOUTH TWICE A DAY (Patient not taking: Reported on 05/02/2021) 180 tablet 1   gabapentin (NEURONTIN) 100 MG capsule Take 1-2 capsules (100-200 mg total) by mouth in the morning, at noon, and at bedtime. (Patient not taking: Reported on 05/02/2021) 180 capsule 3   meloxicam (MOBIC) 15 MG tablet Take 1 tablet (15 mg total) by mouth daily. (Patient not taking: Reported on 05/02/2021) 30 tablet 0   No facility-administered medications prior to visit.    ROS: Review of Systems  Constitutional:  Positive for fatigue. Negative for activity change, appetite change, chills and unexpected weight change.  HENT:  Negative for congestion, mouth sores and sinus pressure.   Eyes:  Negative for visual disturbance.  Respiratory:  Positive for cough. Negative for chest tightness.   Gastrointestinal:  Negative for abdominal pain and nausea.  Genitourinary:  Negative for difficulty urinating, frequency and vaginal pain.  Musculoskeletal:  Positive for arthralgias. Negative for back pain and gait problem.  Skin:  Negative for pallor  and rash.  Neurological:  Negative for dizziness, tremors, weakness, numbness and headaches.  Psychiatric/Behavioral:  Negative for confusion, sleep disturbance and suicidal ideas. The patient is nervous/anxious.    Objective:  BP 130/72 (BP Location: Left Arm)   Pulse (!) 59   Temp 98.6 F (37 C) (Oral)   Ht 5\' 3"  (1.6 m)   Wt 174 lb (78.9 kg)   SpO2 98%   BMI 30.82 kg/m   BP Readings from Last 3 Encounters:  05/02/21 130/72  03/21/21 128/72  02/10/21 140/62    Wt Readings from Last 3 Encounters:   05/02/21 174 lb (78.9 kg)  03/21/21 172 lb 12.8 oz (78.4 kg)  02/10/21 175 lb 12.8 oz (79.7 kg)    Physical Exam Constitutional:      General: She is not in acute distress.    Appearance: She is well-developed. She is obese.  HENT:     Head: Normocephalic.     Right Ear: External ear normal.     Left Ear: External ear normal.     Nose: Nose normal.  Eyes:     General:        Right eye: No discharge.        Left eye: No discharge.     Conjunctiva/sclera: Conjunctivae normal.     Pupils: Pupils are equal, round, and reactive to light.  Neck:     Thyroid: No thyromegaly.     Vascular: No JVD.     Trachea: No tracheal deviation.  Cardiovascular:     Rate and Rhythm: Normal rate and regular rhythm.     Heart sounds: Normal heart sounds.  Pulmonary:     Effort: No respiratory distress.     Breath sounds: No stridor. No wheezing.  Abdominal:     General: Bowel sounds are normal. There is no distension.     Palpations: Abdomen is soft. There is no mass.     Tenderness: There is no abdominal tenderness. There is no guarding or rebound.  Musculoskeletal:        General: No tenderness.     Cervical back: Normal range of motion and neck supple. No rigidity.  Lymphadenopathy:     Cervical: No cervical adenopathy.  Skin:    Findings: No erythema or rash.  Neurological:     Cranial Nerves: No cranial nerve deficit.     Motor: No abnormal muscle tone.     Coordination: Coordination normal.     Deep Tendon Reflexes: Reflexes normal.  Psychiatric:        Behavior: Behavior normal.        Thought Content: Thought content normal.        Judgment: Judgment normal.   ?goiter Lab Results  Component Value Date   WBC 4.8 06/23/2020   HGB 12.5 06/23/2020   HCT 39.9 06/23/2020   PLT 222 06/23/2020   GLUCOSE 113 (H) 02/10/2021   CHOL 243 (H) 09/23/2019   TRIG 167.0 (H) 09/23/2019   HDL 40.70 09/23/2019   LDLDIRECT 153.6 07/06/2013   LDLCALC 169 (H) 09/23/2019   ALT 8 02/10/2021    AST 15 02/10/2021   NA 141 02/10/2021   K 4.0 02/10/2021   CL 104 02/10/2021   CREATININE 1.06 02/10/2021   BUN 20 02/10/2021   CO2 28 02/10/2021   TSH 1.71 09/23/2019   INR 1.0 06/23/2020   HGBA1C 5.8 09/20/2020    No results found.  Assessment & Plan:     Follow-up: No follow-ups on file.  [name], MD

## 2021-05-02 NOTE — Assessment & Plan Note (Signed)
Mild- thyroid US

## 2021-05-02 NOTE — Assessment & Plan Note (Addendum)
Coronary calcium CT offered  Cont w/Amlodipine, Losartan, Triamt-HCTZ 0.5 tab

## 2021-05-02 NOTE — Patient Instructions (Addendum)
Memory Foam Wedge Pillow   Cardiac CT calcium scoring test $99 Tel # is [phone]   Computed tomography, more commonly known as a CT or CAT scan, is a diagnostic medical imaging test. Like traditional x-rays, it produces multiple images or pictures of the inside of the body. The cross-sectional images generated during a CT scan can be reformatted in multiple planes. They can even generate three-dimensional images. These images can be viewed on a computer monitor, printed on film or by a 3D printer, or transferred to a CD or DVD. CT images of internal organs, bones, soft tissue and blood vessels provide greater detail than traditional x-rays, particularly of soft tissues and blood vessels. A cardiac CT scan for coronary calcium is a non-invasive way of obtaining information about the presence, location and extent of calcified plaque in the coronary arteries--the vessels that supply oxygen-containing blood to the heart muscle. Calcified plaque results when there is a build-up of fat and other substances under the inner layer of the artery. This material can calcify which signals the presence of atherosclerosis, a disease of the vessel wall, also called coronary artery disease (CAD). People with this disease have an increased risk for heart attacks. In addition, over time, progression of plaque build up (CAD) can narrow the arteries or even close off blood flow to the heart. The result may be chest pain, sometimes called "angina," or a heart attack. Because calcium is a marker of CAD, the amount of calcium detected on a cardiac CT scan is a helpful prognostic tool. The findings on cardiac CT are expressed as a calcium score. Another name for this test is coronary artery calcium scoring.  What are some common uses of the procedure? The goal of cardiac CT scan for calcium scoring is to determine if CAD is present and to what extent, even if there are no symptoms. It is a screening study that may be  recommended by a physician for patients with risk factors for CAD but no clinical symptoms. The major risk factors for CAD are: high blood cholesterol levels  family history of heart attacks  diabetes  high blood pressure  cigarette smoking  overweight or obese  physical inactivity   A negative cardiac CT scan for calcium scoring shows no calcification within the coronary arteries. This suggests that CAD is absent or so minimal it cannot be seen by this technique. The chance of having a heart attack over the next two to five years is very low under these circumstances. A positive test means that CAD is present, regardless of whether or not the patient is experiencing any symptoms. The amount of calcification--expressed as the calcium score--may help to predict the likelihood of a myocardial infarction (heart attack) in the coming years and helps your medical doctor or cardiologist decide whether the patient may need to take preventive medicine or undertake other measures such as diet and exercise to lower the risk for heart attack. The extent of CAD is graded according to your calcium score:  Calcium Score  Presence of CAD (coronary artery disease)  0 No evidence of CAD   1-10 Minimal evidence of CAD  11-100 Mild evidence of CAD  101-400 Moderate evidence of CAD  Over 400 Extensive evidence of CAD   Coronary artery calcium (CAC) score is a strong predictor of incident coronary heart disease (CHD) and provides predictive information beyond traditional risk factors. CAC scoring is reasonable to use in the decision to withhold, postpone, or initiate statin therapy  in intermediate-risk or selected borderline-risk asymptomatic adults (age 16-75 years and LDL-C >=70 to <190 mg/dL) who do not have diabetes or established atherosclerotic cardiovascular disease (ASCVD).* In intermediate-risk (10-year ASCVD risk >=7.5% to <20%) adults or selected borderline-risk (10-year ASCVD risk >=5% to  <7.5%) adults in whom a CAC score is measured for the purpose of making a treatment decision the following recommendations have been made:   If CAC=0, it is reasonable to withhold statin therapy and reassess in 5 to 10 years, as long as higher risk conditions are absent (diabetes mellitus, family history of premature CHD in first degree relatives (males <55 years; females <65 years), cigarette smoking, or LDL >=190 mg/dL).   If CAC is 1 to 99, it is reasonable to initiate statin therapy for patients >=55 years of age.   If CAC is >=100 or >=75th percentile, it is reasonable to initiate statin therapy at any age.   Cardiology referral should be considered for patients with CAC scores >=400 or >=75th percentile.   *2018 AHA/ACC/AACVPR/AAPA/ABC/ACPM/ADA/AGS/APhA/ASPC/NLA/PCNA Guideline on the Management of Blood Cholesterol: A Report of the American College of Cardiology/American Heart Association Task Force on Clinical Practice Guidelines. J Am Coll Cardiol. 2019;73(24):3168-3209.

## 2021-05-02 NOTE — Assessment & Plan Note (Signed)
Coronary calcium CT offered

## 2021-05-02 NOTE — Assessment & Plan Note (Addendum)
Chronic ough at night - not taking Pepcid... ?due to GERD. Worse. Re-start Pepcid at HS Wedge for GERD Cont w/allergy meds CXR Thyroid US

## 2021-05-19 ENCOUNTER — Ambulatory Visit
Admission: RE | Admit: 2021-05-19 | Discharge: 2021-05-19 | Disposition: A | Discharge status: Home / Self Care | Payer: Medicare HMO | Source: Ambulatory Visit | Attending: Internal Medicine | Admitting: Internal Medicine

## 2021-05-19 DIAGNOSIS — E042 Nontoxic multinodular goiter: Secondary | ICD-10-CM | POA: Diagnosis not present

## 2021-06-13 ENCOUNTER — Ambulatory Visit: Payer: Medicare HMO | Admitting: Family

## 2021-06-20 ENCOUNTER — Other Ambulatory Visit: Payer: Self-pay | Admitting: Allergy & Immunology

## 2021-07-11 ENCOUNTER — Other Ambulatory Visit: Payer: Self-pay | Admitting: Internal Medicine

## 2021-07-11 ENCOUNTER — Ambulatory Visit: Payer: Medicare HMO

## 2021-07-11 MED ORDER — VENLAFAXINE HCL ER 75 MG PO CP24: 75.0000 mg | ORAL_CAPSULE | Freq: Every day | ORAL | 1 refills | Status: DC

## 2021-07-14 DIAGNOSIS — M25462 Effusion, left knee: Secondary | ICD-10-CM | POA: Diagnosis not present

## 2021-07-18 ENCOUNTER — Other Ambulatory Visit: Payer: Self-pay

## 2021-07-18 ENCOUNTER — Ambulatory Visit (INDEPENDENT_AMBULATORY_CARE_PROVIDER_SITE_OTHER): Payer: Medicare HMO

## 2021-07-18 DIAGNOSIS — Z23 Encounter for immunization: Secondary | ICD-10-CM

## 2021-07-19 NOTE — Patient Instructions (Addendum)
1. Seasonal and perennial allergic rhinitis (grasses, ragweed, weeds, trees, indoor molds, outdoor molds, dust mites and cat) - Continue with: Zyrtec (cetirizine) '10mg'$  TWO TIMES daily and Flonase (fluticasone) one spray per nostril daily -May use over-the-counter Pataday eyedrops as needed for itchy watery eyes.  If you change your mind we can send in a prescription   2. Allergic urticaria  - Morning: Zyrtec (cetirizine) '10mg'$  (one tablet)   - Evening: Zyrtec (cetirizine) '10mg'$  (one tablet) + hydroxyzine 20-'50mg'$  at night - You can change this dosing at home, decreasing the dose as needed or increasing the dosing as needed.  - Consider starting Xolair for long term control without all of the medications.   3. Itchy ears - continue triamcinolone ointment as needed 1-2 times daily applied with a Q tip into the ear - Do not go to far and puncture your ear drum!   Please let us know if this treatment plan is not working well for you Schedule a follow-up appointment in 6 months or sooner if needed

## 2021-07-20 ENCOUNTER — Encounter: Payer: Self-pay | Admitting: Family

## 2021-07-20 ENCOUNTER — Other Ambulatory Visit: Payer: Self-pay

## 2021-07-20 ENCOUNTER — Ambulatory Visit (INDEPENDENT_AMBULATORY_CARE_PROVIDER_SITE_OTHER): Payer: Medicare HMO | Admitting: Family

## 2021-07-20 VITALS — BP 126/70 | HR 51 | Temp 98.6°F | Resp 16 | Ht 63.0 in | Wt 181.4 lb

## 2021-07-20 DIAGNOSIS — J302 Other seasonal allergic rhinitis: Secondary | ICD-10-CM | POA: Diagnosis not present

## 2021-07-20 DIAGNOSIS — L500 Allergic urticaria: Secondary | ICD-10-CM | POA: Diagnosis not present

## 2021-07-20 DIAGNOSIS — J3089 Other allergic rhinitis: Secondary | ICD-10-CM | POA: Diagnosis not present

## 2021-07-20 MED ORDER — FLUTICASONE PROPIONATE 50 MCG/ACT NA SUSP: 1.0000 | Freq: Every day | NASAL | 5 refills | Status: DC | PRN

## 2021-07-20 MED ORDER — CETIRIZINE HCL 10 MG PO TABS: 10.0000 mg | ORAL_TABLET | Freq: Two times a day (BID) | ORAL | 1 refills | Status: DC

## 2021-07-20 MED ORDER — HYDROXYZINE HCL 25 MG PO TABS: ORAL_TABLET | ORAL | 3 refills | Status: DC

## 2021-07-20 NOTE — Progress Notes (Signed)
[place] [address] Dept: [phone]  FOLLOW UP NOTE  Patient ID: Jane Mooney, female    DOB: Dec 02, 1950  Age: 70 y.o. MRN: [id number] Date of Office Visit: 07/20/2021  Assessment  Chief Complaint: Follow-up  HPI Jane Mooney is a 70 year old female who presents today for follow-up of seasonal and perennial allergic rhinitis, allergic urticaria, and itchy ears.  She was last seen on December 13, 2020 by Dr. Ernst Bowler.  Seasonal and perennial allergic rhinitis is reported as controlled with Zyrtec 10 mg twice a day and Flonase nasal spray as needed.  She reports occasional rhinorrhea and when she uses her Flonase it helps.  She denies nasal congestion and postnasal drip.  She has not had any sinus infections since we last saw her.  Allergic urticaria is reported as controlled with cetirizine 10 mg twice a day and hydroxyzine 25 to 50 mg at night.  She denies any hives since her last office visit and reports that she will have an occasional spot that itches that does not amount to anything.  Itchy ears is reported as controlled with triamcinolone ointment as needed.   Drug Allergies:  Allergies  Allergen Reactions   Citalopram     weak   Mirtazapine     Made pt very groggy   Quinapril Hcl Cough    Review of Systems: Review of Systems  Constitutional:  Negative for chills and fever.  HENT:         Reports occasional clear rhinorrhea and when she uses her Flonase it helps.  Denies nasal congestion and postnasal drip  Eyes:        Reports occasional itchy watery eyes at this time.  Does not wish for a prescription of Pataday at this time  Respiratory:  Negative for cough, shortness of breath and wheezing.   Cardiovascular:  Negative for chest pain and palpitations.  Gastrointestinal:        Reports occasional reflux for which Pepcid helps some and also elevating the head of her bed.  She reports that she is spoken with her primary care physician about  this.  Genitourinary:  Negative for frequency.  Skin:  Positive for itching. Negative for rash.       Reports occasional itching that does not amount to anything.  Denies hives  Neurological:  Negative for headaches.  Endo/Heme/Allergies:  Positive for environmental allergies.    Physical Exam: BP 126/70   Pulse (!) 51   Temp 98.6 F (37 C) (Temporal)   Resp 16   Ht '5\' 3"'$  (1.6 m)   Wt 181 lb 6 oz (82.3 kg)   SpO2 100%   BMI 32.13 kg/m    Physical Exam Constitutional:      Appearance: Normal appearance.  HENT:     Head: Normocephalic and atraumatic.     Comments: Pharynx normal, eyes normal, ears normal, nose: Bilateral lower turbinates mildly edematous and slightly erythematous with clear drainage noted    Right Ear: Tympanic membrane, ear canal and external ear normal.     Left Ear: Tympanic membrane, ear canal and external ear normal.     Mouth/Throat:     Mouth: Mucous membranes are moist.     Pharynx: Oropharynx is clear.  Eyes:     Conjunctiva/sclera: Conjunctivae normal.  Cardiovascular:     Rate and Rhythm: Regular rhythm.     Heart sounds: Normal heart sounds.  Pulmonary:     Effort: Pulmonary effort is normal.  Breath sounds: Normal breath sounds.     Comments: Lungs clear to auscultation Musculoskeletal:     Cervical back: Neck supple.  Skin:    General: Skin is warm.     Comments: No rashes or urticarial lesions noted  Neurological:     Mental Status: She is alert and oriented to person, place, and time.  Psychiatric:        Mood and Affect: Mood normal.        Behavior: Behavior normal.        Thought Content: Thought content normal.        Judgment: Judgment normal.    Diagnostics:  None  Assessment and Plan: 1. Seasonal and perennial allergic rhinitis   2. Allergic urticaria     No orders of the defined types were placed in this encounter.   Patient Instructions  1. Seasonal and perennial allergic rhinitis (grasses, ragweed, weeds,  trees, indoor molds, outdoor molds, dust mites and cat) - Continue with: Zyrtec (cetirizine) '10mg'$  TWO TIMES daily and Flonase (fluticasone) one spray per nostril daily -May use over-the-counter Pataday eyedrops as needed for itchy watery eyes.  If you change your mind we can send in a prescription   2. Allergic urticaria  - Morning: Zyrtec (cetirizine) '10mg'$  (one tablet)   - Evening: Zyrtec (cetirizine) '10mg'$  (one tablet) + hydroxyzine 20-'50mg'$  at night - You can change this dosing at home, decreasing the dose as needed or increasing the dosing as needed.  - Consider starting Xolair for long term control without all of the medications.   3. Itchy ears - continue triamcinolone ointment as needed 1-2 times daily applied with a Q tip into the ear - Do not go to far and puncture your ear drum!   Please let us know if this treatment plan is not working well for you Schedule a follow-up appointment in 6 months or sooner if needed   Return in about 6 months (around 01/17/2022), or if symptoms worsen or fail to improve.    Thank you for the opportunity to care for this patient.  Please do not hesitate to contact me with questions.  Althea Charon, FNP [facility] [place] [facility] [place]

## 2021-07-21 DIAGNOSIS — S76112D Strain of left quadriceps muscle, fascia and tendon, subsequent encounter: Secondary | ICD-10-CM | POA: Diagnosis not present

## 2021-07-21 DIAGNOSIS — M25562 Pain in left knee: Secondary | ICD-10-CM | POA: Diagnosis not present

## 2021-07-21 DIAGNOSIS — R262 Difficulty in walking, not elsewhere classified: Secondary | ICD-10-CM | POA: Diagnosis not present

## 2021-07-27 ENCOUNTER — Other Ambulatory Visit (HOSPITAL_BASED_OUTPATIENT_CLINIC_OR_DEPARTMENT_OTHER): Payer: Self-pay

## 2021-08-01 ENCOUNTER — Other Ambulatory Visit: Payer: Self-pay

## 2021-08-01 ENCOUNTER — Ambulatory Visit: Payer: Medicare HMO | Attending: Internal Medicine

## 2021-08-01 ENCOUNTER — Other Ambulatory Visit (HOSPITAL_BASED_OUTPATIENT_CLINIC_OR_DEPARTMENT_OTHER): Payer: Self-pay

## 2021-08-01 DIAGNOSIS — Z23 Encounter for immunization: Secondary | ICD-10-CM

## 2021-08-01 MED ORDER — PFIZER COVID-19 VAC BIVALENT 30 MCG/0.3ML IM SUSP
INTRAMUSCULAR | 0 refills | Status: DC
  Filled 2021-08-01: qty 0.3, 1d supply, fill #0

## 2021-08-01 NOTE — Progress Notes (Signed)
   Covid-19 Vaccination Clinic  Name:  Jane Mooney    MRN: [id number] DOB: [birth year]  08/01/2021  [name] was observed post Covid-19 immunization for 15 minutes without incident. She was provided with Vaccine Information Sheet and instruction to access the V-Safe system.   [name] was instructed to call [phone] with any severe reactions post vaccine: Difficulty breathing  Swelling of face and throat  A fast heartbeat  A bad rash all over body  Dizziness and weakness

## 2021-08-02 ENCOUNTER — Other Ambulatory Visit: Payer: Self-pay | Admitting: Internal Medicine

## 2021-08-21 ENCOUNTER — Other Ambulatory Visit (HOSPITAL_COMMUNITY): Payer: Self-pay

## 2021-08-30 ENCOUNTER — Other Ambulatory Visit (INDEPENDENT_AMBULATORY_CARE_PROVIDER_SITE_OTHER): Payer: Medicare HMO

## 2021-08-30 ENCOUNTER — Other Ambulatory Visit: Payer: Self-pay

## 2021-08-30 DIAGNOSIS — N183 Chronic kidney disease, stage 3 unspecified: Secondary | ICD-10-CM | POA: Diagnosis not present

## 2021-08-30 DIAGNOSIS — E049 Nontoxic goiter, unspecified: Secondary | ICD-10-CM | POA: Diagnosis not present

## 2021-08-30 DIAGNOSIS — R059 Cough, unspecified: Secondary | ICD-10-CM | POA: Diagnosis not present

## 2021-08-30 DIAGNOSIS — E785 Hyperlipidemia, unspecified: Secondary | ICD-10-CM

## 2021-08-30 DIAGNOSIS — I10 Essential (primary) hypertension: Secondary | ICD-10-CM | POA: Diagnosis not present

## 2021-08-30 LAB — COMPREHENSIVE METABOLIC PANEL
ALT: 9 U/L (ref 0–35)
AST: 16 U/L (ref 0–37)
Albumin: 4.2 g/dL (ref 3.5–5.2)
Alkaline Phosphatase: 82 U/L (ref 39–117)
BUN: 15 mg/dL (ref 6–23)
CO2: 30 mEq/L (ref 19–32)
Calcium: 9.4 mg/dL (ref 8.4–10.5)
Chloride: 102 mEq/L (ref 96–112)
Creatinine, Ser: 1.09 mg/dL (ref 0.40–1.20)
GFR: 50.89 mL/min — ABNORMAL LOW (ref >60.00)
Glucose, Bld: 107 mg/dL — ABNORMAL HIGH (ref 70–99)
Potassium: 4.1 mEq/L (ref 3.5–5.1)
Sodium: 139 mEq/L (ref 135–145)
Total Bilirubin: 0.5 mg/dL (ref 0.2–1.2)
Total Protein: 7.3 g/dL (ref 6.0–8.3)

## 2021-08-30 LAB — CBC WITH DIFFERENTIAL/PLATELET
Basophils Absolute: 0 10*3/uL (ref 0.0–0.1)
Basophils Relative: 1 % (ref 0.0–3.0)
Eosinophils Absolute: 0.2 10*3/uL (ref 0.0–0.7)
Eosinophils Relative: 4.4 % (ref 0.0–5.0)
HCT: 37.8 % (ref 36.0–46.0)
Hemoglobin: 12.2 g/dL (ref 12.0–15.0)
Lymphocytes Relative: 40.8 % (ref 12.0–46.0)
Lymphs Abs: 1.9 10*3/uL (ref 0.7–4.0)
MCHC: 32.3 g/dL (ref 30.0–36.0)
MCV: 79.6 fl (ref 78.0–100.0)
Monocytes Absolute: 0.7 10*3/uL (ref 0.1–1.0)
Monocytes Relative: 14 % — ABNORMAL HIGH (ref 3.0–12.0)
Neutro Abs: 1.9 10*3/uL (ref 1.4–7.7)
Neutrophils Relative %: 39.8 % — ABNORMAL LOW (ref 43.0–77.0)
Platelets: 214 10*3/uL (ref 150.0–400.0)
RBC: 4.75 Mil/uL (ref 3.87–5.11)
RDW: 14 % (ref 11.5–15.5)
WBC: 4.7 10*3/uL (ref 4.0–10.5)

## 2021-08-30 LAB — LIPID PANEL
Cholesterol: 249 mg/dL — ABNORMAL HIGH (ref 0–200)
HDL: 47.8 mg/dL (ref >39.00)
LDL Cholesterol: 179 mg/dL — ABNORMAL HIGH (ref 0–99)
NonHDL: 201.4
Total CHOL/HDL Ratio: 5
Triglycerides: 113 mg/dL (ref 0.0–149.0)
VLDL: 22.6 mg/dL (ref 0.0–40.0)

## 2021-08-30 LAB — TSH: TSH: 2.07 u[IU]/mL (ref 0.35–5.50)

## 2021-09-05 ENCOUNTER — Encounter: Payer: Self-pay | Admitting: Internal Medicine

## 2021-09-05 ENCOUNTER — Ambulatory Visit (INDEPENDENT_AMBULATORY_CARE_PROVIDER_SITE_OTHER): Payer: Medicare HMO | Admitting: Internal Medicine

## 2021-09-05 ENCOUNTER — Other Ambulatory Visit: Payer: Self-pay

## 2021-09-05 VITALS — BP 122/70 | HR 62 | Temp 98.5°F | Ht 63.0 in | Wt 178.8 lb

## 2021-09-05 DIAGNOSIS — M25571 Pain in right ankle and joints of right foot: Secondary | ICD-10-CM | POA: Diagnosis not present

## 2021-09-05 DIAGNOSIS — R053 Chronic cough: Secondary | ICD-10-CM

## 2021-09-05 DIAGNOSIS — E538 Deficiency of other specified B group vitamins: Secondary | ICD-10-CM

## 2021-09-05 DIAGNOSIS — I10 Essential (primary) hypertension: Secondary | ICD-10-CM | POA: Diagnosis not present

## 2021-09-05 DIAGNOSIS — E785 Hyperlipidemia, unspecified: Secondary | ICD-10-CM

## 2021-09-05 DIAGNOSIS — Z5320 Procedure and treatment not carried out because of patient's decision for unspecified reasons: Secondary | ICD-10-CM | POA: Insufficient documentation

## 2021-09-05 DIAGNOSIS — N183 Chronic kidney disease, stage 3 unspecified: Secondary | ICD-10-CM

## 2021-09-05 DIAGNOSIS — B351 Tinea unguium: Secondary | ICD-10-CM

## 2021-09-05 NOTE — Assessment & Plan Note (Signed)
On Vit B12 

## 2021-09-05 NOTE — Assessment & Plan Note (Addendum)
Refractory - Pum ref Pepcid at HS Wedge pillow for GERD Cont w/allergy meds

## 2021-09-05 NOTE — Assessment & Plan Note (Signed)
Check CMET. 

## 2021-09-05 NOTE — Assessment & Plan Note (Signed)
Pt has declined statins Cont w/wt loss 

## 2021-09-05 NOTE — Assessment & Plan Note (Signed)
Worse Podiatry consult

## 2021-09-05 NOTE — Progress Notes (Signed)
Subjective:  Patient ID: Jane Mooney, female    DOB: July 28, 1951  Age: 70 y.o. MRN: [id number]  CC: Follow-up (4 month f/u)   HPI Jane Mooney presents for R ankle swelling x 3 weeks  F/u on HTN, anxiety, OA C/o sore R knee  Outpatient Medications Prior to Visit  Medication Sig Dispense Refill   acetaminophen (TYLENOL) 500 MG tablet Take 1,000 mg by mouth every 6 (six) hours as needed for moderate pain or headache.     amLODipine (NORVASC) 5 MG tablet Take 1 tablet (5 mg total) by mouth daily. 90 tablet 3   cetirizine (ZYRTEC) 10 MG tablet Take 1 tablet (10 mg total) by mouth 2 (two) times daily. 180 tablet 1   cholecalciferol (VITAMIN D) 1000 units tablet Take 2 tablets (2,000 Units total) by mouth daily. (Patient taking differently: Take 1,000 Units by mouth daily.) 100 tablet 11   Cyanocobalamin (VITAMIN B-12) 1000 MCG SUBL Place 1 tablet (1,000 mcg total) under the tongue daily. 100 tablet 3   famotidine (PEPCID) 20 MG tablet Take 1 tablet (20 mg total) by mouth daily. 90 tablet 3   fish oil-omega-3 fatty acids 1000 MG capsule Take 1 g by mouth daily.     fluticasone (FLONASE) 50 MCG/ACT nasal spray Place 1 spray into both nostrils daily as needed for allergies. 16 g 5   hydrOXYzine (ATARAX/VISTARIL) 25 MG tablet Take 1-2 tablets at night as needed for itching. Caution as this medication can make you drowsy 60 tablet 3   LORazepam (ATIVAN) 1 MG tablet TAKE 1 TABLET BY MOUTH 2 TIMES DAILY AS NEEDED FOR ANXIETY. 60 tablet 3   meloxicam (MOBIC) 15 MG tablet TAKE 1 TABLET (15 MG TOTAL) BY MOUTH DAILY. 30 tablet 0   Polyethylene Glycol 3350 (PEG 3350) 17 GM/SCOOP POWD TAKE 17 G BY MOUTH 2 (TWO) TIMES DAILY AS NEEDED. 510 g 1   telmisartan (MICARDIS) 80 MG tablet Take 1 tablet (80 mg total) by mouth daily. 90 tablet 3   triamcinolone ointment (KENALOG) 0.1 % Apply 1 application topically 4 (four) times daily. (Patient taking differently: Apply 1 application topically 4 (four) times daily  as needed (hemorrhoids).) 80 g 1   triamterene-hydrochlorothiazide (MAXZIDE-25) 37.5-25 MG tablet Take 0.5 tablets by mouth daily. 90 tablet 1   venlafaxine XR (EFFEXOR XR) 75 MG 24 hr capsule Take 1 capsule (75 mg total) by mouth daily with breakfast. 90 capsule 1   COVID-19 mRNA bivalent vaccine, Pfizer, (PFIZER COVID-19 VAC BIVALENT) injection Inject into the muscle. (Patient not taking: Reported on 09/05/2021) 0.3 mL 0   No facility-administered medications prior to visit.    ROS: Review of Systems  Constitutional:  Negative for activity change, appetite change, chills, fatigue and unexpected weight change.  HENT:  Negative for congestion, mouth sores and sinus pressure.   Eyes:  Negative for visual disturbance.  Respiratory:  Negative for cough and chest tightness.   Cardiovascular:  Positive for leg swelling.  Gastrointestinal:  Negative for abdominal pain and nausea.  Genitourinary:  Negative for difficulty urinating, frequency and vaginal pain.  Musculoskeletal:  Positive for arthralgias, back pain and gait problem.  Skin:  Negative for pallor and rash.  Neurological:  Negative for dizziness, tremors, weakness, numbness and headaches.  Psychiatric/Behavioral:  Negative for confusion and sleep disturbance.    Objective:  BP 122/70 (BP Location: Left Arm)   Pulse 62   Temp 98.5 F (36.9 C) (Oral)   Ht 5\' 3"  (1.6 m)  Wt 178 lb 12.8 oz (81.1 kg)   SpO2 97%   BMI 31.67 kg/m   BP Readings from Last 3 Encounters:  09/05/21 122/70  07/20/21 126/70  05/02/21 130/72    Wt Readings from Last 3 Encounters:  09/05/21 178 lb 12.8 oz (81.1 kg)  07/20/21 181 lb 6 oz (82.3 kg)  05/02/21 174 lb (78.9 kg)    Physical Exam Constitutional:      General: She is not in acute distress.    Appearance: She is well-developed. She is obese.  HENT:     Head: Normocephalic.     Right Ear: External ear normal.     Left Ear: External ear normal.     Nose: Nose normal.  Eyes:      General:        Right eye: No discharge.        Left eye: No discharge.     Conjunctiva/sclera: Conjunctivae normal.     Pupils: Pupils are equal, round, and reactive to light.  Neck:     Thyroid: No thyromegaly.     Vascular: No JVD.     Trachea: No tracheal deviation.  Cardiovascular:     Rate and Rhythm: Normal rate and regular rhythm.     Heart sounds: Normal heart sounds.  Pulmonary:     Effort: No respiratory distress.     Breath sounds: No stridor. No wheezing.  Abdominal:     General: Bowel sounds are normal. There is no distension.     Palpations: Abdomen is soft. There is no mass.     Tenderness: There is no abdominal tenderness. There is no guarding or rebound.  Musculoskeletal:        General: Tenderness present.     Cervical back: Normal range of motion and neck supple. No rigidity.  Lymphadenopathy:     Cervical: No cervical adenopathy.  Skin:    Findings: No erythema or rash.  Neurological:     Cranial Nerves: No cranial nerve deficit.     Motor: No abnormal muscle tone.     Coordination: Coordination normal.     Deep Tendon Reflexes: Reflexes normal.  Psychiatric:        Behavior: Behavior normal.        Thought Content: Thought content normal.        Judgment: Judgment normal.  R knee w/pain R lat ankle w/swelling and pain  Lab Results  Component Value Date   WBC 4.7 08/30/2021   HGB 12.2 08/30/2021   HCT 37.8 08/30/2021   PLT 214.0 08/30/2021   GLUCOSE 107 (H) 08/30/2021   CHOL 249 (H) 08/30/2021   TRIG 113.0 08/30/2021   HDL 47.80 08/30/2021   LDLDIRECT 153.6 07/06/2013   LDLCALC 179 (H) 08/30/2021   ALT 9 08/30/2021   AST 16 08/30/2021   NA 139 08/30/2021   K 4.1 08/30/2021   CL 102 08/30/2021   CREATININE 1.09 08/30/2021   BUN 15 08/30/2021   CO2 30 08/30/2021   TSH 2.07 08/30/2021   INR 1.0 06/23/2020   HGBA1C 5.8 09/20/2020    US THYROID  Result Date: 05/19/2021 CLINICAL DATA:  Goiter. EXAM: THYROID ULTRASOUND TECHNIQUE:  Ultrasound examination of the thyroid gland and adjacent soft tissues was performed. COMPARISON:  None. FINDINGS: Parenchymal Echotexture: Mildly heterogenous Isthmus: 0.8 cm Right lobe: 4.9 x 1.7 x 2.0 cm Left lobe: 5.1 x 2.2 x 2.1 cm _________________________________________________________ Estimated total number of nodules >/= 1 cm: 4 Number of spongiform nodules >/=  2 cm not described below (TR1): 0 Number of mixed cystic and solid nodules >/= 1.5 cm not described below (TR2): 0 _________________________________________________________ Nodule 1 is a heterogeneous but predominantly isoechoic nodule in the isthmus that measures 1.1 x 0.6 x 1.2 cm. This is a TR 3 nodule. Given size (<1.4 cm) and appearance, this nodule does NOT meet TI-RADS criteria for biopsy or dedicated follow-up. Nodule 3 is a spongiform nodule in the left mid thyroid lobe that measures 1.2 x 1.0 x 1.2 cm. This nodule does not meet criteria for biopsy or dedicated follow-up. Nodule 4 in the left inferior thyroid lobe is a heterogeneous nodule. This may be a mixed cystic and solid nodule. Solid component may be slightly hypoechoic. Echogenic areas are suggestive for inspissated colloid rather than microcalcifications. This is compatible with a TR 3 nodule. *Given size (>/= 1.5 - 2.4 cm) and appearance, a follow-up ultrasound in 1 year should be considered based on TI-RADS criteria. Nodule 5 in the left inferior thyroid lobe is a mixed cystic and solid nodule that measures 1.0 x 1.0 x 1.4 cm. This nodule does not meet criteria for biopsy or dedicated follow-up. IMPRESSION: 1. Thyroid tissue is mildly heterogeneous with bilateral nodules. 2. Nodule 4 in the left inferior thyroid lobe is heterogeneous and uncertain composition. This is most compatible with a TR 3 nodule and recommend 1 year follow-up. The above is in keeping with the ACR TI-RADS recommendations - J Am Coll Radiol 2017;14:587-595. Electronically Signed   By: Markus Daft M.D.   On:  05/19/2021 16:06    Assessment & Plan:   Problem List Items Addressed This Visit     Ankle pain, right - Primary    R lat ankle w/swelling and pain - OA vs other ACE wrap Podiatry ref Good shoes      Relevant Orders   Ambulatory referral to Podiatry   Cough    Refractory - Pum ref Pepcid at HS Wedge pillow for GERD Cont w/allergy meds      Relevant Orders   Ambulatory referral to Pulmonology   CRI (chronic renal insufficiency), stage 3 (moderate) (HCC)    Check CMET      Dyslipidemia    Pt has declined statins Cont w/wt loss      Essential hypertension    Cont on Amlodipine, Losartan, Triamt-HCTZ 0.5 tab qd      Onychomycosis    Worse Podiatry consult      Relevant Orders   Ambulatory referral to Podiatry   Statin declined    Pt has declined statins Cont w/wt loss      Vitamin B 12 deficiency    On Vit B12         No orders of the defined types were placed in this encounter.     Follow-up: Return in about 3 months (around 12/06/2021) for a follow-up visit.  [name], MD

## 2021-09-05 NOTE — Assessment & Plan Note (Signed)
R lat ankle w/swelling and pain - OA vs other ACE wrap Podiatry ref Good shoes

## 2021-09-05 NOTE — Assessment & Plan Note (Signed)
Cont on Amlodipine, Losartan, Triamt-HCTZ 0.5 tab qd

## 2021-09-14 ENCOUNTER — Ambulatory Visit (INDEPENDENT_AMBULATORY_CARE_PROVIDER_SITE_OTHER): Payer: Medicare HMO

## 2021-09-14 ENCOUNTER — Encounter: Payer: Self-pay | Admitting: Sports Medicine

## 2021-09-14 ENCOUNTER — Ambulatory Visit: Payer: Medicare HMO | Admitting: Sports Medicine

## 2021-09-14 ENCOUNTER — Other Ambulatory Visit: Payer: Self-pay

## 2021-09-14 DIAGNOSIS — M7751 Other enthesopathy of right foot: Secondary | ICD-10-CM | POA: Diagnosis not present

## 2021-09-14 DIAGNOSIS — M25571 Pain in right ankle and joints of right foot: Secondary | ICD-10-CM | POA: Diagnosis not present

## 2021-09-14 DIAGNOSIS — S93401A Sprain of unspecified ligament of right ankle, initial encounter: Secondary | ICD-10-CM

## 2021-09-14 DIAGNOSIS — M7750 Other enthesopathy of unspecified foot: Secondary | ICD-10-CM

## 2021-09-14 NOTE — Progress Notes (Signed)
Subjective:  AALIJAH MIMS is a 70 y.o. female patient who presents to office for evaluation of right ankle pain states that 3 weeks ago she bumped her right ankle against a rocking chair in her home and states that she saw her primary doctor and they gave her an Ace wrap and she took Advil states that it it feels like it is getting better but still very sensitive to touch.  Patient also mentions as I was getting ready to leave the treatment room as she had questions about her thick toenails.  Patient Active Problem List   Diagnosis Date Noted   Ankle pain, right 09/05/2021   Statin declined 09/05/2021   Cough 05/02/2021   Goiter 05/02/2021   Right hip pain 02/10/2021   Achilles tendinitis 12/20/2020   Memory problem 12/20/2020   Pain in surgical scar 09/20/2020   Seasonal and perennial allergic rhinitis 03/31/2020   Decreased GFR 12/22/2019   CRI (chronic renal insufficiency), stage 3 (moderate) ([place]) 09/26/2019   Abscess of face 06/13/2017   Constipation 02/22/2017   Chest pain, atypical 11/30/2016   GERD (gastroesophageal reflux disease) 11/30/2016   Menopause 10/23/2016   Abscess of finger, left 08/17/2016   Thoracic back pain 09/29/2015   Osteoarthritis of both knees 09/29/2015   Allergic urticaria 03/29/2014   Grief 03/22/2014   Urticaria 01/18/2014   Acute upper respiratory infections of unspecified site 09/02/2013   Well adult exam 07/02/2012   Obesity 03/06/2012   Cervical pain (neck) 12/28/2011   Neoplasm of uncertain behavior of skin 08/03/2011   Onychomycosis 08/03/2011   URI, acute 06/20/2011   Vitamin B 12 deficiency 05/07/2011   Hemorrhoid 05/03/2011   Dyslipidemia 10/31/2010   TRIGGER FINGER 03/30/2010   BRONCHITIS, ACUTE 12/15/2009   WEIGHT LOSS 12/15/2009   LOW BACK PAIN 10/25/2009   HYPERGLYCEMIA 10/25/2009   Vitamin D deficiency 02/24/2009   VERTIGO 04/09/2008   KNEE PAIN 03/30/2008   ALLERGIC RHINITIS 02/13/2008   Pain in joint, shoulder region  02/13/2008   Anxiety state 09/30/2007   Situational depression 09/30/2007   EAR PAIN 09/30/2007   Essential hypertension 09/30/2007    Current Outpatient Medications on File Prior to Visit  Medication Sig Dispense Refill   acetaminophen (TYLENOL) 500 MG tablet Take 1,000 mg by mouth every 6 (six) hours as needed for moderate pain or headache.     amLODipine (NORVASC) 5 MG tablet Take 1 tablet (5 mg total) by mouth daily. 90 tablet 3   cetirizine (ZYRTEC) 10 MG tablet Take 1 tablet (10 mg total) by mouth 2 (two) times daily. 180 tablet 1   cholecalciferol (VITAMIN D) 1000 units tablet Take 2 tablets (2,000 Units total) by mouth daily. (Patient taking differently: Take 1,000 Units by mouth daily.) 100 tablet 11   Cyanocobalamin (VITAMIN B-12) 1000 MCG SUBL Place 1 tablet (1,000 mcg total) under the tongue daily. 100 tablet 3   famotidine (PEPCID) 20 MG tablet Take 1 tablet (20 mg total) by mouth daily. 90 tablet 3   fish oil-omega-3 fatty acids 1000 MG capsule Take 1 g by mouth daily.     fluticasone (FLONASE) 50 MCG/ACT nasal spray Place 1 spray into both nostrils daily as needed for allergies. 16 g 5   hydrOXYzine (ATARAX/VISTARIL) 25 MG tablet Take 1-2 tablets at night as needed for itching. Caution as this medication can make you drowsy 60 tablet 3   LORazepam (ATIVAN) 1 MG tablet TAKE 1 TABLET BY MOUTH 2 TIMES DAILY AS NEEDED FOR ANXIETY.  60 tablet 3   meloxicam (MOBIC) 15 MG tablet TAKE 1 TABLET (15 MG TOTAL) BY MOUTH DAILY. 30 tablet 0   Polyethylene Glycol 3350 (PEG 3350) 17 GM/SCOOP POWD TAKE 17 G BY MOUTH 2 (TWO) TIMES DAILY AS NEEDED. 510 g 1   telmisartan (MICARDIS) 80 MG tablet Take 1 tablet (80 mg total) by mouth daily. 90 tablet 3   triamcinolone ointment (KENALOG) 0.1 % Apply 1 application topically 4 (four) times daily. (Patient taking differently: Apply 1 application topically 4 (four) times daily as needed (hemorrhoids).) 80 g 1   triamterene-hydrochlorothiazide (MAXZIDE-25)  37.5-25 MG tablet Take 0.5 tablets by mouth daily. 90 tablet 1   venlafaxine XR (EFFEXOR XR) 75 MG 24 hr capsule Take 1 capsule (75 mg total) by mouth daily with breakfast. 90 capsule 1   No current facility-administered medications on file prior to visit.    Allergies  Allergen Reactions   Citalopram     weak   Mirtazapine     Made pt very groggy   Quinapril Hcl Cough    Objective:  General: Alert and oriented x3 in no acute distress  Dermatology: No open lesions bilateral lower extremities, no webspace macerations, no ecchymosis bilateral, all nails have polish present.  Vascular: Dorsalis Pedis and Posterior Tibial pedal pulses palpable, Capillary Fill Time 3 seconds,(+) pedal hair growth bilateral, no edema bilateral lower extremities, Temperature gradient within normal limits.  Neurology: Johney [place] sensation intact via light touch bilateral.  Musculoskeletal: Mild to moderate tenderness with palpation at right lateral ankle over the lateral ankle ligaments and peroneal tendon course posterior to the fibula on the right, there is no pain with direct touch to the fibula bone on the right, range of motion appears to be adequate of the right ankle with mild guarding no significant instability noted on the right.    Xrays  Right ankle   Impression: Normal osseous mineralization, no significant fracture, no dislocation, no signs of arthritis.  Assessment and Plan: Problem List Items Addressed This Visit       Other   Ankle pain, right - Primary   Relevant Orders   DG Ankle Complete Right   Other Visit Diagnoses     Capsulitis of right ankle       Tendinitis of ankle       Sprain of right ankle, unspecified ligament, initial encounter            -Complete examination performed -Xrays reviewed -Discussed treatement options for likely tendinitis versus capsulitis versus low-grade ankle sprain -Rx Tri-Lock ankle brace for patient to use as directed -Continue with rest ice  elevation and good supportive shoe with her brace -Advised patient at next visit we can further discuss her nail concerns at this time advised patient to return to office for her nails to be further evaluated and to remove her nail polish -Patient to return to office as scheduled or sooner if condition worsens.  Landis Martins, DPM

## 2021-09-22 ENCOUNTER — Other Ambulatory Visit: Payer: Self-pay | Admitting: Sports Medicine

## 2021-09-22 DIAGNOSIS — M7751 Other enthesopathy of right foot: Secondary | ICD-10-CM

## 2021-09-25 ENCOUNTER — Other Ambulatory Visit: Payer: Self-pay | Admitting: Internal Medicine

## 2021-09-26 ENCOUNTER — Ambulatory Visit: Payer: Medicare HMO | Admitting: Pulmonary Disease

## 2021-09-26 ENCOUNTER — Encounter: Payer: Self-pay | Admitting: Pulmonary Disease

## 2021-09-26 ENCOUNTER — Other Ambulatory Visit: Payer: Self-pay | Admitting: Pulmonary Disease

## 2021-09-26 ENCOUNTER — Ambulatory Visit (INDEPENDENT_AMBULATORY_CARE_PROVIDER_SITE_OTHER): Payer: Medicare HMO | Admitting: Pulmonary Disease

## 2021-09-26 ENCOUNTER — Other Ambulatory Visit: Payer: Self-pay

## 2021-09-26 VITALS — BP 124/70 | HR 54 | Temp 98.6°F | Ht 63.5 in | Wt 180.4 lb

## 2021-09-26 DIAGNOSIS — R942 Abnormal results of pulmonary function studies: Secondary | ICD-10-CM | POA: Diagnosis not present

## 2021-09-26 DIAGNOSIS — J984 Other disorders of lung: Secondary | ICD-10-CM | POA: Diagnosis not present

## 2021-09-26 DIAGNOSIS — R053 Chronic cough: Secondary | ICD-10-CM

## 2021-09-26 LAB — PULMONARY FUNCTION TEST
DL/VA % pred: 94 %
DL/VA: 3.92 ml/min/mmHg/L
DLCO unc % pred: 74 %
DLCO unc: 13.94 ml/min/mmHg
FEF 25-75 Post: 2.69 L/sec
FEF 25-75 Pre: 3.04 L/sec
FEF2575-%Change-Post: -11 %
FEF2575-%Pred-Post: 173 %
FEF2575-%Pred-Pre: 196 %
FEV1-%Change-Post: -2 %
FEV1-%Pred-Post: 129 %
FEV1-%Pred-Pre: 132 %
FEV1-Post: 2.19 L
FEV1-Pre: 2.24 L
FEV1FVC-%Change-Post: 2 %
FEV1FVC-%Pred-Pre: 111 %
FEV6-%Change-Post: -5 %
FEV6-%Pred-Post: 118 %
FEV6-%Pred-Pre: 124 %
FEV6-Post: 2.49 L
FEV6-Pre: 2.62 L
FEV6FVC-%Pred-Post: 104 %
FEV6FVC-%Pred-Pre: 104 %
FVC-%Change-Post: -5 %
FVC-%Pred-Post: 113 %
FVC-%Pred-Pre: 119 %
FVC-Post: 2.49 L
FVC-Pre: 2.62 L
Post FEV1/FVC ratio: 88 %
Post FEV6/FVC ratio: 100 %
Pre FEV1/FVC ratio: 86 %
Pre FEV6/FVC Ratio: 100 %
RV % pred: 42 %
RV: 0.93 L
TLC % pred: 76 %
TLC: 3.76 L

## 2021-09-26 MED ORDER — HYDROCODONE BIT-HOMATROP MBR 5-1.5 MG/5ML PO SOLN: 5.0000 mL | Freq: Four times a day (QID) | ORAL | 0 refills | Status: DC | PRN

## 2021-09-26 NOTE — Progress Notes (Signed)
PFT done today. 

## 2021-09-26 NOTE — Patient Instructions (Addendum)
Chronic cough --ARRANGE pulmonary function tests --START hycodan cough syrup nightly --CONTINUE pepcid --CONTINUE allergy regimen per Dr. Ernst Bowler

## 2021-09-26 NOTE — Progress Notes (Signed)
Subjective:   PATIENT ID: Jane Mooney GENDER: female DOB: 01/07/1951, MRN: [id number]   HPI  Chief Complaint  Patient presents with   Consult    On and off about 47mo and only at night, wakes her up often    Reason for Visit: New consult for chronic cough  [name] is a 70 year old female with allergic rhinitis, history of recurrent urticaria, hypertension and CKD who presents as a new consult for chronic cough.  For the last six months she gradually developed chronic cough that is unproductive. Denies preceding illness. Denies wheezing or shortness of breath. Occurs mostly at night which wakes her up with episodes that last for few minutes until she takes a ricola. No daytime cough. Drinks hot tea or water at bedtime. The cough is severe enough to cause her a sore throat later.  She has been following Dr. Ernst Bowler in Allergy. Compliant with her current regimen with zyrtec. Still has some post-nasal drainage. No recent urticaria >1 year. Her PCP started on her pepcid 3-4 months ago. However she continues to have cough. Reviewed notes from Allergy on 07/20/21 and PCP on 09/05/21.  Social History: Never smoker No pets Retired Health and safety inspector exposures: None. Denies dust or industrial exposures.  I have personally reviewed patient's past medical/family/social history, allergies, current medications  Past Medical History:  Diagnosis Date   Allergic rhinitis    Anemia    Anxiety 2010   Chronic kidney disease    renal insufficiency    Depression    DJD (degenerative joint disease)    Left knee   Heart murmur    as a child    Hemorrhoid    HTN (hypertension)    Hyperlipidemia    LBP (low back pain)    MVP (mitral valve prolapse)    PONV (postoperative nausea and vomiting)    Symptomatic PVCs    Urticaria    recurrent     Family History  Problem Relation Age of Onset   Hypertension Mother    Diabetes Other    Hypertension Other    Breast cancer Other     Cancer Other        breast    Colon cancer Neg Hx    Esophageal cancer Neg Hx    Stomach cancer Neg Hx    Rectal cancer Neg Hx      Social History   Occupational History   Occupation: Retired Programmer, 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     amLODipine (NORVASC) 5 MG tablet TAKE 1 TABLET BY MOUTH EVERY DAY 90 tablet 3   cetirizine (ZYRTEC) 10 MG tablet Take 1 tablet (10 mg total) by mouth 2 (two) times daily. 180 tablet 1   cholecalciferol (VITAMIN D) 1000 units tablet Take 2 tablets (2,000 Units total)  by mouth daily. (Patient taking differently: Take 1,000 Units by mouth daily.) 100 tablet 11   Cyanocobalamin (VITAMIN B-12) 1000 MCG SUBL Place 1 tablet (1,000 mcg total) under the tongue daily. 100 tablet 3   famotidine (PEPCID) 20 MG tablet Take 1 tablet (20 mg total) by mouth daily. 90 tablet 3   fish oil-omega-3 fatty acids 1000 MG capsule Take 1 g by mouth daily.     fluticasone (FLONASE) 50 MCG/ACT nasal spray Place 1 spray into both nostrils daily as needed for allergies. 16 g 5   hydrOXYzine (ATARAX/VISTARIL) 25 MG tablet Take 1-2 tablets at night as needed for itching. Caution as this medication can make you drowsy 60 tablet 3   LORazepam (ATIVAN) 1 MG tablet TAKE 1 TABLET BY MOUTH 2 TIMES DAILY AS NEEDED FOR ANXIETY. 60 tablet 3   meloxicam (MOBIC) 15 MG tablet TAKE 1 TABLET (15 MG TOTAL) BY MOUTH DAILY. 30 tablet 0   Polyethylene Glycol 3350 (PEG 3350)  17 GM/SCOOP POWD TAKE 17 G BY MOUTH 2 (TWO) TIMES DAILY AS NEEDED. 510 g 1   telmisartan (MICARDIS) 80 MG tablet TAKE 1 TABLET BY MOUTH EVERY DAY 90 tablet 3   triamcinolone ointment (KENALOG) 0.1 % Apply 1 application topically 4 (four) times daily. (Patient taking differently: Apply 1 application topically 4 (four) times daily as needed (hemorrhoids).) 80 g 1   triamterene-hydrochlorothiazide (MAXZIDE-25) 37.5-25 MG tablet TAKE 1/2 TABLET BY MOUTH EVERY DAY 90 tablet 3   venlafaxine XR (EFFEXOR XR) 75 MG 24 hr capsule Take 1 capsule (75 mg total) by mouth daily with breakfast. 90 capsule 1   No facility-administered medications prior to visit.    Review of Systems  Constitutional:  Negative for chills, diaphoresis, fever, malaise/fatigue and weight loss.  HENT:  Positive for sore throat. Negative for congestion and ear pain.   Respiratory:  Positive for cough. Negative for hemoptysis, sputum production, shortness of breath and wheezing.   Cardiovascular:  Negative for chest pain, palpitations and leg swelling.  Gastrointestinal:  Negative for abdominal pain, heartburn and nausea.  Genitourinary:  Negative for frequency.  Musculoskeletal:  Positive for joint pain. Negative for myalgias.  Skin:  Negative for itching and rash.  Neurological:  Negative for dizziness, weakness and headaches.  Endo/Heme/Allergies:  Positive for environmental allergies. Does not bruise/bleed easily.  Psychiatric/Behavioral:  Positive for depression. The patient is not nervous/anxious.     Objective:   Vitals:   09/26/21 0943  BP: 124/70  Pulse: (!) 54  Temp: 98.6 F (37 C)  TempSrc: Oral  SpO2: 97%  Weight: 180 lb 6.4 oz (81.8 kg)  Height: 5' 3.5" (1.613 m)   SpO2: 97 % O2 Device: None (Room air)  Physical Exam: General: Well-appearing, no acute distress HENT: [place], AT Eyes: EOMI, no scleral icterus Respiratory: Clear to auscultation bilaterally.  No crackles, wheezing or rales Cardiovascular:  RRR, -M/R/G, no JVD Extremities:-Edema,-tenderness Neuro: AAO x4, CNII-XII grossly intact Psych: Normal mood, normal affect  Data Reviewed:  Imaging: CXR 05/02/2021-normal chest x-ray.  No infiltrate, effusion or edema.  No overt parenchymal abnormalities  PFT: 09/26/2021 FVC 2.49 (113%) FEV1 2.19 (129%) Ratio 76 TLC 74% DLCO 74% Interpretation: No obstructive defect.  Mild restrictive defect with mildly reduced gas exchange.  No significant bronchodilator response.  Normal F-V loops.  Labs: CBC    Component Value Date/Time   WBC 4.7 08/30/2021 1048   RBC 4.75 08/30/2021 1048   HGB 12.2 08/30/2021 1048   HCT 37.8 08/30/2021  1048   PLT 214.0 08/30/2021 1048   MCV 79.6 08/30/2021 1048   MCH 26.3 06/23/2020 0922   MCHC 32.3 08/30/2021 1048   RDW 14.0 08/30/2021 1048   LYMPHSABS 1.9 08/30/2021 1048   MONOABS 0.7 08/30/2021 1048   EOSABS 0.2 08/30/2021 1048   BASOSABS 0.0 08/30/2021 1048   Absolute eos 08/30/2021-200     Assessment & Plan:   Discussion: 70 year old female with allergic rhinitis, history of recurrent urticaria, hypertension and CKD who presents as a new consult for chronic cough x 6 months. No clear trigger though post-nasal drip intermittently occurs. Currently on treatment for reflux. We discussed ruling out underlying lung disease with pulmonary function tests. Also discussed neurogenic cough if this remains persistent.  She was scheduled for PFTs at 12:00 PM. We discussed results post-procedure  Chronic cough --ARRANGE pulmonary function tests --START hycodan cough syrup nightly --CONTINUE pepcid --CONTINUE allergy regimen per Dr. Ernst Bowler  Addendum Same Day-PFT results reviewed which demonstrate mild restrictive defect with reduced DLCO.  Patient will need high-resolution CT for further evaluation.  Attempted to call patient however went straight to voicemail.  Left callback number and brief message regarding abnormal PFT results needing further  testing.  Once patient calls back, will order test and follow-up.  Health Maintenance Immunization History  Administered Date(s) Administered   Fluad Quad(high Dose 65+) 07/29/2019, 09/20/2020, 07/18/2021   Influenza Split 08/03/2011, 09/09/2012   Influenza Whole 07/05/2010   Influenza, High Dose Seasonal PF 08/17/2016, 07/23/2017, 09/19/2018   Influenza,inj,Quad PF,6+ Mos 07/10/2013, 07/28/2014, 09/29/2015   PFIZER Comirnaty(Gray Top)Covid-19 Tri-Sucrose Vaccine 03/29/2021   PFIZER(Purple Top)SARS-COV-2 Vaccination 12/26/2019, 01/18/2020, 08/27/2020   Pfizer Covid-19 Vaccine Bivalent Booster 47yrs & up 08/01/2021   Pneumococcal Conjugate-13 03/31/2015   Pneumococcal Polysaccharide-23 11/15/2014   Tdap 07/02/2012   Zoster, Live 07/15/2012   CT Lung Screen- not indicated. Never smoker  No orders of the defined types were placed in this encounter. Meds ordered this encounter  Medications   HYDROcodone bit-homatropine (HYCODAN) 5-1.5 MG/5ML syrup    Sig: Take 5 mLs by mouth every 6 (six) hours as needed for cough.    Dispense:  240 mL    Refill:  0   No follow-ups on file.  I have spent a total time of 60-minutes on the day of the appointment reviewing prior documentation, coordinating care and discussing medical diagnosis and plan with the patient/family. Imaging, labs and tests included in this note have been reviewed and interpreted independently by me.  [place], MD [facility] 09/26/2021 10:59 AM  Office Number [phone]

## 2021-09-27 ENCOUNTER — Telehealth: Payer: Self-pay | Admitting: Pulmonary Disease

## 2021-09-27 DIAGNOSIS — R942 Abnormal results of pulmonary function studies: Secondary | ICD-10-CM | POA: Insufficient documentation

## 2021-09-27 DIAGNOSIS — J984 Other disorders of lung: Secondary | ICD-10-CM

## 2021-09-27 NOTE — Telephone Encounter (Signed)
Spoke with pt and review PFT results as dictated by Dr. Loanne Drilling. Informed pt of recommended HRCT and f/u OV. Order for HRCT were placed and OV was scheduled 10/17/21 to review scan results. Nothing further needed at this time.

## 2021-09-27 NOTE — Telephone Encounter (Signed)
I have called patient and discussed PFTs in detail regarding mild restrictive defect with mildly reduced gas exchange that could represent early ILD in setting of her recent cough. We discussed the benefits and risks of obtaining scan. After discussion patient agreeable to scan. If this is normal, will consider repeating PFTs in 12-18 months to monitor for stability. We also discussed trial of bronchodilators if she has evidence of significant bronchitis on imaging.  No further action needed. HRCT already ordered.

## 2021-09-27 NOTE — Telephone Encounter (Signed)
LMTCB for the pt 

## 2021-09-27 NOTE — Telephone Encounter (Signed)
[facility] Telephone Encounter  Attempted to call patient however went straight to voicemail.  Left callback number and brief message regarding abnormal PFT results needing further testing.  When she returns call, please give following update:  I have reviewed PFT which demonstrate mild restrictive defect with reduced DLCO which can suggest early interstitial lung abnormalities. Recommend high-resolution CT for further evaluation. Please order test if patient is agreeable and schedule follow-up the week of December 5th.

## 2021-10-11 ENCOUNTER — Other Ambulatory Visit: Payer: Self-pay

## 2021-10-11 ENCOUNTER — Ambulatory Visit (INDEPENDENT_AMBULATORY_CARE_PROVIDER_SITE_OTHER)
Admission: RE | Admit: 2021-10-11 | Discharge: 2021-10-11 | Disposition: A | Discharge status: Home / Self Care | Payer: Medicare HMO | Source: Ambulatory Visit | Attending: Pulmonary Disease | Admitting: Pulmonary Disease

## 2021-10-11 DIAGNOSIS — R918 Other nonspecific abnormal finding of lung field: Secondary | ICD-10-CM | POA: Diagnosis not present

## 2021-10-11 DIAGNOSIS — J984 Other disorders of lung: Secondary | ICD-10-CM

## 2021-10-11 DIAGNOSIS — I2510 Atherosclerotic heart disease of native coronary artery without angina pectoris: Secondary | ICD-10-CM | POA: Diagnosis not present

## 2021-10-11 DIAGNOSIS — R059 Cough, unspecified: Secondary | ICD-10-CM | POA: Diagnosis not present

## 2021-10-11 DIAGNOSIS — I700 Atherosclerosis of aorta: Secondary | ICD-10-CM | POA: Diagnosis not present

## 2021-10-12 ENCOUNTER — Ambulatory Visit: Payer: Medicare HMO | Admitting: Sports Medicine

## 2021-10-12 ENCOUNTER — Encounter: Payer: Self-pay | Admitting: Sports Medicine

## 2021-10-12 ENCOUNTER — Telehealth: Payer: Self-pay | Admitting: Pulmonary Disease

## 2021-10-12 DIAGNOSIS — L601 Onycholysis: Secondary | ICD-10-CM | POA: Diagnosis not present

## 2021-10-12 DIAGNOSIS — M25571 Pain in right ankle and joints of right foot: Secondary | ICD-10-CM

## 2021-10-12 DIAGNOSIS — M7751 Other enthesopathy of right foot: Secondary | ICD-10-CM | POA: Diagnosis not present

## 2021-10-12 DIAGNOSIS — S93401D Sprain of unspecified ligament of right ankle, subsequent encounter: Secondary | ICD-10-CM | POA: Diagnosis not present

## 2021-10-12 DIAGNOSIS — M7750 Other enthesopathy of unspecified foot: Secondary | ICD-10-CM

## 2021-10-12 DIAGNOSIS — B351 Tinea unguium: Secondary | ICD-10-CM

## 2021-10-12 NOTE — Telephone Encounter (Signed)
Please call patient with results:  No evidence of interstitial lung disease which is what we were concerned about based on your PFTs. However we did see a a lung nodule measuring <1 cm in your right lung. This would not be causing your symptoms however I would like to keep our clinic visit to discuss further imaging with you to monitor this lung nodule. Your are scheduled for 10/17/21 to review the CT together and address any questions at that time.  Rodman Pickle, M.D. [facility] 10/12/2021 10:44 AM

## 2021-10-12 NOTE — Telephone Encounter (Signed)
Spoke with Opal Sidles at [facility] radiology Call report on HRCT dated 10/11/21  Impression:    IMPRESSION: 1. No findings to suggest interstitial lung disease. 2. 8 x 7 mm right middle lobe pulmonary nodule. Non-contrast chest CT at 6-12 months is recommended. If the nodule is stable at time of repeat CT, then future CT at 18-24 months (from today's scan) is considered optional for low-risk patients, but is recommended for high-risk patients. This recommendation follows the consensus statement: Guidelines for Management of Incidental Pulmonary Nodules Detected on CT Images: From the Fleischner Society 2017; Radiology 2017; 284:228-243. 3. Aortic atherosclerosis, in addition to left main and 2 vessel coronary artery disease. Please note that although the presence of coronary artery calcium documents the presence of coronary artery disease, the severity of this disease and any potential stenosis cannot be assessed on this non-gated CT examination. Assessment for potential risk factor modification, dietary therapy or pharmacologic therapy may be warranted, if clinically indicated.   These results will be called to the ordering clinician or representative by the Radiologist Assistant, and communication documented in the PACS or [employer].   Aortic Atherosclerosis (ICD10-I70.0).     Electronically Signed   By: Vinnie Langton M.D.   On: 10/11/2021 20:18

## 2021-10-12 NOTE — Telephone Encounter (Signed)
Patient is returning phone call. Patient phone number is [phone].

## 2021-10-12 NOTE — Telephone Encounter (Signed)
Call made to patient, confirmed DOB. Made aware of results per JE. Voiced understanding. Confirmed appt date and time.   Nothing further needed at this time.

## 2021-10-12 NOTE — Telephone Encounter (Signed)
Called pt and there was no answer- LMTCB and will hold in triage.

## 2021-10-12 NOTE — Progress Notes (Signed)
Subjective: Jane Mooney is a 70 y.o. female patient seen today in office for follow-up evaluation of right ankle pain and for further discussion of fungus of toenails.  Patient reports that she has removed her nail polish and her nails have been thickened for a really long time wants to discuss treatment options states however her ankle feels much better the Tri-Lock brace has helped and has very minimal discomfort and denies any swelling. Patient has no other pedal complaints at this time.   Patient Active Problem List   Diagnosis Date Noted   Restrictive lung disease 09/27/2021   Diffusion capacity of lung (dl), decreased 09/27/2021   Ankle pain, right 09/05/2021   Statin declined 09/05/2021   Cough 05/02/2021   Goiter 05/02/2021   Right hip pain 02/10/2021   Achilles tendinitis 12/20/2020   Memory problem 12/20/2020   Pain in surgical scar 09/20/2020   Seasonal and perennial allergic rhinitis 03/31/2020   Decreased GFR 12/22/2019   CRI (chronic renal insufficiency), stage 3 (moderate) ([place]) 09/26/2019   Abscess of face 06/13/2017   Constipation 02/22/2017   Chest pain, atypical 11/30/2016   GERD (gastroesophageal reflux disease) 11/30/2016   Menopause 10/23/2016   Abscess of finger, left 08/17/2016   Thoracic back pain 09/29/2015   Osteoarthritis of both knees 09/29/2015   Allergic urticaria 03/29/2014   Grief 03/22/2014   Well adult exam 07/02/2012   Obesity 03/06/2012   Cervical pain (neck) 12/28/2011   Neoplasm of uncertain behavior of skin 08/03/2011   Onychomycosis 08/03/2011   Vitamin B 12 deficiency 05/07/2011   Hemorrhoid 05/03/2011   Dyslipidemia 10/31/2010   TRIGGER FINGER 03/30/2010   WEIGHT LOSS 12/15/2009   LOW BACK PAIN 10/25/2009   HYPERGLYCEMIA 10/25/2009   Vitamin D deficiency 02/24/2009   VERTIGO 04/09/2008   KNEE PAIN 03/30/2008   ALLERGIC RHINITIS 02/13/2008   Pain in joint, shoulder region 02/13/2008   Anxiety state 09/30/2007   Situational  depression 09/30/2007   EAR PAIN 09/30/2007   Essential hypertension 09/30/2007    Current Outpatient Medications on File Prior to Visit  Medication Sig Dispense Refill   acetaminophen (TYLENOL) 500 MG tablet Take 1,000 mg by mouth every 6 (six) hours as needed for moderate pain or headache.     amLODipine (NORVASC) 5 MG tablet TAKE 1 TABLET BY MOUTH EVERY DAY 90 tablet 3   cetirizine (ZYRTEC) 10 MG tablet Take 1 tablet (10 mg total) by mouth 2 (two) times daily. 180 tablet 1   cholecalciferol (VITAMIN D) 1000 units tablet Take 2 tablets (2,000 Units total) by mouth daily. (Patient taking differently: Take 1,000 Units by mouth daily.) 100 tablet 11   Cyanocobalamin (VITAMIN B-12) 1000 MCG SUBL Place 1 tablet (1,000 mcg total) under the tongue daily. 100 tablet 3   famotidine (PEPCID) 20 MG tablet Take 1 tablet (20 mg total) by mouth daily. 90 tablet 3   fish oil-omega-3 fatty acids 1000 MG capsule Take 1 g by mouth daily.     fluticasone (FLONASE) 50 MCG/ACT nasal spray Place 1 spray into both nostrils daily as needed for allergies. 16 g 5   HYDROcodone bit-homatropine (HYCODAN) 5-1.5 MG/5ML syrup Take 5 mLs by mouth every 6 (six) hours as needed for cough. 240 mL 0   hydrOXYzine (ATARAX/VISTARIL) 25 MG tablet Take 1-2 tablets at night as needed for itching. Caution as this medication can make you drowsy 60 tablet 3   LORazepam (ATIVAN) 1 MG tablet TAKE 1 TABLET BY MOUTH 2 TIMES DAILY  AS NEEDED FOR ANXIETY. 60 tablet 3   meloxicam (MOBIC) 15 MG tablet TAKE 1 TABLET (15 MG TOTAL) BY MOUTH DAILY. 30 tablet 0   Polyethylene Glycol 3350 (PEG 3350) 17 GM/SCOOP POWD TAKE 17 G BY MOUTH 2 (TWO) TIMES DAILY AS NEEDED. 510 g 1   telmisartan (MICARDIS) 80 MG tablet TAKE 1 TABLET BY MOUTH EVERY DAY 90 tablet 3   triamcinolone ointment (KENALOG) 0.1 % Apply 1 application topically 4 (four) times daily. (Patient taking differently: Apply 1 application topically 4 (four) times daily as needed (hemorrhoids).)  80 g 1   triamterene-hydrochlorothiazide (MAXZIDE-25) 37.5-25 MG tablet TAKE 1/2 TABLET BY MOUTH EVERY DAY 90 tablet 3   venlafaxine XR (EFFEXOR XR) 75 MG 24 hr capsule Take 1 capsule (75 mg total) by mouth daily with breakfast. 90 capsule 1   No current facility-administered medications on file prior to visit.    Allergies  Allergen Reactions   Citalopram     weak   Mirtazapine     Made pt very groggy   Quinapril Hcl Cough    Objective: Physical Exam  General: Well developed, nourished, no acute distress, awake, alert and oriented x 3  Dermatology: No open lesions bilateral lower extremities, no webspace macerations, no ecchymosis bilateral, all nails are thickened and elongated with subungual debris with most involvement bilateral hallux concerning for onychomycosis.  Patient admits a previous history of having the left hallux nail removed and states that after it was removed it grew back like this very thick.   Vascular: Dorsalis Pedis and Posterior Tibial pedal pulses palpable, Capillary Fill Time 3 seconds,(+) pedal hair growth bilateral, no edema bilateral lower extremities, Temperature gradient within normal limits.   Neurology: Johney Maine sensation intact via light touch bilateral.   Musculoskeletal: Minimal tenderness with palpation at right lateral ankle over the lateral ankle ligaments however pain appears to be much improved, range of motion appears to be adequate of the right ankle with no guarding no significant instability noted on the right.  No increased pain with manual muscle testing on the right ankle.  Assessment and Plan:  Problem List Items Addressed This Visit       Other   Ankle pain, right   Other Visit Diagnoses     Nail fungus    -  Primary   Capsulitis of right ankle       Tendinitis of ankle       Sprain of right ankle, unspecified ligament, subsequent encounter           -Examined patient -Discussed with patient continued care for right ankle  that is improving -Advised patient to continue with Tri-Lock for 1 more month then after 1 month may slowly wean as directed from using the Tri-Lock brace -Educated patient on home physical therapy to help strengthen the ankle -Advised patient if there is pain may use OTC topical pain cream or rub as needed -Discussed treatment options for painful dystrophic nails  -Fungal culture was obtained by removing a portion of the hard nail itself from each of the involved toenails 1 through 10 using a sterile nail nipper and sent to [facility] lab. Patient tolerated the biopsy procedure well without discomfort or need for anesthesia.  -Patient to return in 4 weeks for follow up evaluation of right ankle and discussion of fungal culture results or sooner if symptoms worsen.  Landis Martins, DPM

## 2021-10-17 ENCOUNTER — Ambulatory Visit: Payer: Medicare HMO | Admitting: Pulmonary Disease

## 2021-10-17 ENCOUNTER — Other Ambulatory Visit: Payer: Self-pay

## 2021-10-17 ENCOUNTER — Encounter: Payer: Self-pay | Admitting: Pulmonary Disease

## 2021-10-17 VITALS — BP 140/68 | HR 61 | Temp 98.5°F | Ht 63.5 in | Wt 181.4 lb

## 2021-10-17 DIAGNOSIS — R911 Solitary pulmonary nodule: Secondary | ICD-10-CM | POA: Diagnosis not present

## 2021-10-17 DIAGNOSIS — J984 Other disorders of lung: Secondary | ICD-10-CM

## 2021-10-17 NOTE — Patient Instructions (Addendum)
RML nodule 34mm --ORDER CT Chest without contrast in 6 months (June 2023)  Mild restrictive defect with reduced DLCO CT with no evidence of ILD --Recommend annual PFTs due 09/2022  Follow-up with me in 6 months

## 2021-10-17 NOTE — Progress Notes (Signed)
Subjective:   PATIENT ID: Jane Mooney: female DOB: September 22, 1951, MRN: [id number]   HPI  Chief Complaint  Patient presents with   Follow-up    Cough has gotten better   Reason for Visit: Follow-up chronic cough  Jane Mooney is a 70 year old female with allergic rhinitis, history of recurrent urticaria, hypertension and CKD who presents as a new consult for chronic cough.  Synopsis 09/26/21 For the last six months she gradually developed chronic cough that is unproductive. Denies preceding illness. Denies wheezing or shortness of breath. Occurs mostly at night which wakes her up with episodes that last for few minutes until she takes a ricola. No daytime cough. Drinks hot tea or water at bedtime. The cough is severe enough to cause her a sore throat later. She has been following Dr. Ernst Bowler in [facility]. Compliant with her current regimen with zyrtec. Still has some post-nasal drainage. No recent urticaria >1 year. Her PCP started on her pepcid 3-4 months ago.   10/17/21 She presents to review CT scan. She is anxious and was not able to sleep last night. She does report improvement in her cough and has not coughed in the last two days.  Social History: Never smoker No pets Retired Health and safety inspector exposures: None. Denies dust or industrial exposures.  Past Medical History:  Diagnosis Date   Allergic rhinitis    Anemia    Anxiety 2010   Chronic kidney disease    renal insufficiency    Depression    DJD (degenerative joint disease)    Left knee   Heart murmur    as a child    Hemorrhoid    HTN (hypertension)    Hyperlipidemia    LBP (low back pain)    MVP (mitral valve prolapse)    PONV (postoperative nausea and vomiting)    Symptomatic PVCs    Urticaria    recurrent     Family History  Problem Relation Age of Onset   Hypertension Mother    Diabetes Other    Hypertension Other    Breast cancer Other    Cancer Other        breast    Colon  cancer Neg Hx    Esophageal cancer Neg Hx    Stomach cancer Neg Hx    Rectal cancer Neg Hx      Social History   Occupational History   Occupation: Retired Programmer, 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     amLODipine (NORVASC) 5 MG tablet TAKE 1 TABLET BY MOUTH EVERY DAY 90 tablet 3   cetirizine (ZYRTEC) 10 MG tablet Take 1 tablet (10 mg total) by mouth 2 (two) times daily. 180 tablet 1   cholecalciferol (VITAMIN D) 1000 units tablet Take 2 tablets (2,000 Units total) by mouth daily. (  Patient taking differently: Take 1,000 Units by mouth daily.) 100 tablet 11   Cyanocobalamin (VITAMIN B-12) 1000 MCG SUBL Place 1 tablet (1,000 mcg total) under the tongue daily. 100 tablet 3   famotidine (PEPCID) 20 MG tablet Take 1 tablet (20 mg total) by mouth daily. 90 tablet 3   fish oil-omega-3 fatty acids 1000 MG capsule Take 1 g by mouth daily.     fluticasone (FLONASE) 50 MCG/ACT nasal spray Place 1 spray into both nostrils daily as needed for allergies. 16 g 5   HYDROcodone bit-homatropine (HYCODAN) 5-1.5 MG/5ML syrup Take 5 mLs by mouth every 6 (six) hours as needed for cough. 240 mL 0   hydrOXYzine (ATARAX/VISTARIL) 25 MG tablet Take 1-2 tablets at night as needed for itching. Caution as this medication can make you drowsy 60 tablet 3   LORazepam (ATIVAN) 1 MG tablet TAKE 1 TABLET BY MOUTH 2 TIMES DAILY AS NEEDED FOR ANXIETY. 60 tablet 3   meloxicam (MOBIC) 15 MG tablet TAKE  1 TABLET (15 MG TOTAL) BY MOUTH DAILY. 30 tablet 0   Polyethylene Glycol 3350 (PEG 3350) 17 GM/SCOOP POWD TAKE 17 G BY MOUTH 2 (TWO) TIMES DAILY AS NEEDED. 510 g 1   telmisartan (MICARDIS) 80 MG tablet TAKE 1 TABLET BY MOUTH EVERY DAY 90 tablet 3   triamcinolone ointment (KENALOG) 0.1 % Apply 1 application topically 4 (four) times daily. (Patient taking differently: Apply 1 application topically 4 (four) times daily as needed (hemorrhoids).) 80 g 1   triamterene-hydrochlorothiazide (MAXZIDE-25) 37.5-25 MG tablet TAKE 1/2 TABLET BY MOUTH EVERY DAY 90 tablet 3   venlafaxine XR (EFFEXOR XR) 75 MG 24 hr capsule Take 1 capsule (75 mg total) by mouth daily with breakfast. 90 capsule 1   No facility-administered medications prior to visit.    Review of Systems  Constitutional:  Negative for chills, diaphoresis, fever, malaise/fatigue and weight loss.  HENT:  Negative for congestion.   Respiratory:  Negative for cough, hemoptysis, sputum production, shortness of breath and wheezing.   Cardiovascular:  Negative for chest pain, palpitations and leg swelling.    Objective:   Vitals:   10/17/21 0906  BP: 140/68  Pulse: 61  Temp: 98.5 F (36.9 C)  TempSrc: Oral  SpO2: 100%  Weight: 181 lb 6.4 oz (82.3 kg)  Height: 5' 3.5" (1.613 m)   SpO2: 100 % O2 Device: None (Room air)  Physical Exam: General: Well-appearing, no acute distress HENT: [place], AT Eyes: EOMI, no scleral icterus Respiratory: Clear to auscultation bilaterally.  No crackles, wheezing or rales Cardiovascular: RRR, -M/R/G, no JVD Extremities:-Edema,-tenderness Neuro: AAO x4, CNII-XII grossly intact Psych: Normal mood, normal affect   Data Reviewed:  Imaging: CXR 05/02/2021-normal chest x-ray.  No infiltrate, effusion or edema.  No overt parenchymal abnormalities CT Chest HR 10/11/21 - No evidence of interstitial lung disease. RML nodule measuring 8 x 41mm. No prior CT to compare.  PFT: 09/26/2021 FVC 2.49 (113%) FEV1 2.19  (129%) Ratio 76 TLC 74% DLCO 74% Interpretation: No obstructive defect.  Mild restrictive defect with mildly reduced gas exchange.  No significant bronchodilator response.  Normal F-V loops.  Labs: CBC    Component Value Date/Time   WBC 4.7 08/30/2021 1048   RBC 4.75 08/30/2021 1048   HGB 12.2 08/30/2021 1048   HCT 37.8 08/30/2021 1048   PLT 214.0 08/30/2021 1048   MCV 79.6 08/30/2021 1048   MCH 26.3 06/23/2020 0922   MCHC 32.3 08/30/2021 1048   RDW 14.0 08/30/2021 1048  LYMPHSABS 1.9 08/30/2021 1048   MONOABS 0.7 08/30/2021 1048   EOSABS 0.2 08/30/2021 1048   BASOSABS 0.0 08/30/2021 1048   Absolute eos 08/30/2021-200     Assessment & Plan:   Discussion: 70 year old female never smoker with allergic rhinitis, history of recurrent urticaria, HTN and CKD who presents for follow-up. Previously seen for chronic cough. PFTs with mild restrictive defect and mildly reduced DLCO. CT with no evidence of ILD. Recommend annual PFTs to monitor for progression. Incidental nodule noted with calculated malignancy risk of 9.9% -intermediate range. We discussed ongoing surveillance based on the size of the nodule and her low-intermediate risk. Provided empathy as she is understandably concerned. If she wishes to move CT scan sooner, we could consider 3 months evaluation, but ultimately this will need to be monitored over 2 year period for stability.  Incidental RML nodule 45mm --ORDER CT Chest without contrast in 6 months (June 2023)  Mild restrictive defect with reduced DLCO CT with no evidence of ILD --Recommend annual PFTs due 09/2022  Chronic cough - improving --CONTINUE hycodan cough syrup nightly as needed --CONTINUE pepcid --CONTINUE allergy regimen per Dr. Ernst Bowler  Health Maintenance Immunization History  Administered Date(s) Administered   Fluad Quad(high Dose 65+) 07/29/2019, 09/20/2020, 07/18/2021   Influenza Split 08/03/2011, 09/09/2012   Influenza Whole 07/05/2010    Influenza, High Dose Seasonal PF 08/17/2016, 07/23/2017, 09/19/2018   Influenza,inj,Quad PF,6+ Mos 07/10/2013, 07/28/2014, 09/29/2015   PFIZER Comirnaty(Gray Top)Covid-19 Tri-Sucrose Vaccine 03/29/2021   PFIZER(Purple Top)SARS-COV-2 Vaccination 12/26/2019, 01/18/2020, 08/27/2020   Pfizer Covid-19 Vaccine Bivalent Booster 60yrs & up 08/01/2021   Pneumococcal Conjugate-13 03/31/2015   Pneumococcal Polysaccharide-23 11/15/2014   Tdap 07/02/2012   Zoster, Live 07/15/2012   CT Lung Screen- not indicated. Never smoker  Orders Placed This Encounter  Procedures   CT Chest Wo Contrast    Standing Status:   Future    Standing Expiration Date:   10/17/2022    Scheduling Instructions:     CT chest w/o June 2023    Order Specific Question:   Preferred imaging location?    Answer:   [facility]   No orders of the defined types were placed in this encounter.  Return in about 6 months (around 04/17/2022).  I have spent a total time of 36-minutes on the day of the appointment reviewing prior documentation, coordinating care and discussing medical diagnosis and plan with the patient/family. Past medical history, allergies, medications were reviewed. Pertinent imaging, labs and tests included in this note have been reviewed and interpreted independently by me.  [place], MD [facility] 10/17/2021 9:52 AM  Office Number [phone]

## 2021-10-18 ENCOUNTER — Other Ambulatory Visit: Payer: Self-pay | Admitting: Family

## 2021-10-31 ENCOUNTER — Other Ambulatory Visit: Payer: Self-pay

## 2021-10-31 ENCOUNTER — Ambulatory Visit (INDEPENDENT_AMBULATORY_CARE_PROVIDER_SITE_OTHER): Payer: Medicare HMO

## 2021-10-31 DIAGNOSIS — Z0000 Encounter for general adult medical examination without abnormal findings: Secondary | ICD-10-CM | POA: Diagnosis not present

## 2021-10-31 NOTE — Progress Notes (Addendum)
I connected with Jane Mooney today by telephone and verified that I am speaking with the correct person using two identifiers. Location patient: home Location provider: work Persons participating in the virtual visit: patient, provider.   I discussed the limitations, risks, security and privacy concerns of performing an evaluation and management service by telephone and the availability of in person appointments. I also discussed with the patient that there may be a patient responsible charge related to this service. The patient expressed understanding and verbally consented to this telephonic visit.    Interactive audio and video telecommunications were attempted between this provider and patient, however failed, due to patient having technical difficulties OR patient did not have access to video capability.  We continued and completed visit with audio only.  Some vital signs may be absent or patient reported.   Time Spent with patient on telephone encounter: 40 minutes  Subjective:   Jane Mooney is a 70 y.o. female who presents for Medicare Annual (Subsequent) preventive examination.  Review of Systems     Cardiac Risk Factors include: advanced age (>52men, >56 women);dyslipidemia;family history of premature cardiovascular disease;hypertension;obesity (BMI >30kg/m2)     Objective:    There were no vitals filed for this visit. There is no height or weight on file to calculate BMI.  Advanced Directives 10/31/2021 09/20/2020 07/05/2020 06/23/2020 06/27/2015 05/19/2014  Does Patient Have a Medical Advance Directive? Yes Yes No Yes Yes Patient does not have advance directive  Type of Advance Directive Living will [place];Living will - -  Does patient want to make changes to medical advance directive? No - Patient declined No - Patient declined - - - -  Copy of [place] in Chart? - No - copy requested - - Yes -  Would  patient like information on creating a medical advance directive? - - No - Patient declined - - -    Current Medications (verified) Outpatient Encounter Medications as of 10/31/2021  Medication Sig   acetaminophen (TYLENOL) 500 MG tablet Take 1,000 mg by mouth every 6 (six) hours as needed for moderate pain or headache.   amLODipine (NORVASC) 5 MG tablet TAKE 1 TABLET BY MOUTH EVERY DAY   cetirizine (ZYRTEC) 10 MG tablet Take 1 tablet (10 mg total) by mouth 2 (two) times daily.   cholecalciferol (VITAMIN D) 1000 units tablet Take 2 tablets (2,000 Units total) by mouth daily. (Patient taking differently: Take 1,000 Units by mouth daily.)   Cyanocobalamin (VITAMIN B-12) 1000 MCG SUBL Place 1 tablet (1,000 mcg total) under the tongue daily.   famotidine (PEPCID) 20 MG tablet Take 1 tablet (20 mg total) by mouth daily.   fish oil-omega-3 fatty acids 1000 MG capsule Take 1 g by mouth daily.   fluticasone (FLONASE) 50 MCG/ACT nasal spray Place 1 spray into both nostrils daily as needed for allergies.   HYDROcodone bit-homatropine (HYCODAN) 5-1.5 MG/5ML syrup Take 5 mLs by mouth every 6 (six) hours as needed for cough.   hydrOXYzine (ATARAX) 25 MG tablet TAKE 1-2 TABLETS AT NIGHT AS NEEDED FOR ITCHING. CAUTION AS THIS MEDICATION CAN MAKE YOU DROWSY   LORazepam (ATIVAN) 1 MG tablet TAKE 1 TABLET BY MOUTH 2 TIMES DAILY AS NEEDED FOR ANXIETY.   meloxicam (MOBIC) 15 MG tablet TAKE 1 TABLET (15 MG TOTAL) BY MOUTH DAILY.   Polyethylene Glycol 3350 (PEG 3350) 17 GM/SCOOP POWD TAKE 17 G BY MOUTH 2 (TWO) TIMES DAILY AS NEEDED.  telmisartan (MICARDIS) 80 MG tablet TAKE 1 TABLET BY MOUTH EVERY DAY   triamcinolone ointment (KENALOG) 0.1 % Apply 1 application topically 4 (four) times daily. (Patient taking differently: Apply 1 application topically 4 (four) times daily as needed (hemorrhoids).)   triamterene-hydrochlorothiazide (MAXZIDE-25) 37.5-25 MG tablet TAKE 1/2 TABLET BY MOUTH EVERY DAY   venlafaxine XR  (EFFEXOR XR) 75 MG 24 hr capsule Take 1 capsule (75 mg total) by mouth daily with breakfast.   No facility-administered encounter medications on file as of 10/31/2021.    Allergies (verified) Citalopram, Mirtazapine, and Quinapril hcl   History: Past Medical History:  Diagnosis Date   Allergic rhinitis    Anemia    Anxiety 2010   Chronic kidney disease    renal insufficiency    Depression    DJD (degenerative joint disease)    Left knee   Heart murmur    as a child    Hemorrhoid    HTN (hypertension)    Hyperlipidemia    LBP (low back pain)    MVP (mitral valve prolapse)    PONV (postoperative nausea and vomiting)    Symptomatic PVCs    Urticaria    recurrent   Past Surgical History:  Procedure Laterality Date   ABDOMINAL HYSTERECTOMY  1984   HEMORRHOID SURGERY     KNEE ARTHROSCOPY WITH MEDIAL MENISECTOMY Right 05/21/2014   Procedure: RIGHT KNEE ARTHROSCOPY WITH DEBRIDEMENT/SHAVING, MEDIAL MENISECTOMY;  Surgeon: Renette Butters, MD;  Location: [place];  Service: Orthopedics;  Laterality: Right;   KNEE SURGERY  03/2007   Left Knee Arthr- Dr Onnie Graham   right bunionectomy      ROTATOR CUFF REPAIR  2004   Left    TONSILLECTOMY     TOTAL KNEE ARTHROPLASTY Left 07/05/2020   Procedure: TOTAL KNEE ARTHROPLASTY;  Surgeon: Renette Butters, MD;  Location: [facility];  Service: Orthopedics;  Laterality: Left;   [place]     Family History  Problem Relation Age of Onset   Hypertension Mother    Diabetes Other    Hypertension Other    Breast cancer Other    Cancer Other        breast    Colon cancer Neg Hx    Esophageal cancer Neg Hx    Stomach cancer Neg Hx    Rectal cancer Neg Hx    Social History   Socioeconomic History   Marital status: Married    Spouse name: Not on file   Number of children: 2   Years of education: Not on file   Highest education level: Not on file  Occupational History   Occupation: Retired Programmer, 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  Tobacco Use   Smoking status: Never   Smokeless tobacco: Never  Vaping Use   Vaping Use: Never used  Substance and Sexual Activity   Alcohol use: Not Currently   Drug use: No   Sexual activity: Yes  Other Topics Concern   Not on file  Social History Narrative   Not on file   Social Determinants of Health   Financial Resource Strain: Low Risk    Difficulty of Paying Living Expenses: Not hard at all  Food Insecurity: No Food Insecurity   Worried About Charity fundraiser in the Last Year: Never true   [place] in the Last Year: Never true  Transportation Needs: No Transportation Needs   Lack of Transportation (Medical): No  Lack of Transportation (Non-Medical): No  Physical Activity: Sufficiently Active   Days of Exercise per Week: 5 days   Minutes of Exercise per Session: 30 min  Stress: No Stress Concern Present   Feeling of Stress : Not at all  Social Connections: Socially Integrated   Frequency of Communication with Friends and Family: More than three times a week   Frequency of Social Gatherings with Friends and Family: Once a week   Attends Religious Services: More than 4 times per year   Active Member of [employer] or Organizations: Yes   Attends Music therapist: More than 4 times per year   Marital Status: Married    Tobacco Counseling Counseling given: Not Answered   Clinical Intake:  Pre-visit preparation completed: Yes  Pain : No/denies pain     Nutritional Risks: None Diabetes: No  How often do you need to have someone help you when you read instructions, pamphlets, or other written materials from your doctor or pharmacy?: 1 - Never What is the last grade level you completed in school?: 2 years at [employer]  Diabetic? no  Interpreter Needed?: No  Information entered by :: Lisette Abu, LPN   Activities of Daily Living In your present state of health, do you have any difficulty performing the following  activities: 10/31/2021  Hearing? N  Vision? N  Difficulty concentrating or making decisions? N  Walking or climbing stairs? N  Dressing or bathing? N  Doing errands, shopping? N  Preparing Food and eating ? N  Using the Toilet? N  In the past six months, have you accidently leaked urine? N  Do you have problems with loss of bowel control? N  Managing your Medications? N  Managing your Finances? N  Housekeeping or managing your Housekeeping? N  Some recent data might be hidden    Patient Care Team: Plotnikov, Evie Lacks, MD as PCP - General Olevia Perches, Lowella Bandy, MD (Inactive) (Gastroenterology) Lomax, Marny Lowenstein, MD (Inactive) as Surgeon (Obstetrics and Gynecology) Key, Nelia Shi, NP as Nurse Practitioner (Gynecology) Renette Butters, MD as Attending Physician (Orthopedic Surgery) Gregor Hams, MD as Consulting Physician (Sports Medicine) Elsie Saas, MD as Consulting Physician (Orthopedic Surgery) Clent Jacks, MD as Consulting Physician (Ophthalmology)  Indicate any recent [place] you may have received from other than [facility] providers in the past year (date may be approximate).     Assessment:   This is a routine wellness examination for [place].  Hearing/Vision screen Hearing Screening - Comments:: Patient denied any hearing difficulty.   No hearing aids.  Vision Screening - Comments:: Patient wears corrective glasses/contacts.  Eye exam done annually by: Dr. Clent Jacks.  Dietary issues and exercise activities discussed: Current Exercise Habits: Home exercise routine, Type of exercise: Other - see comments (Uses Silver Sneakers online exercise program and [place]), Time (Minutes): 30, Frequency (Times/Week): 5, Weekly Exercise (Minutes/Week): 150, Intensity: Mild, Exercise limited by: orthopedic condition(s)   Goals Addressed               This Visit's Progress     Patient Stated (pt-stated)        My goal for 2023 is to continue to be independent, physically  active and socially active.  I plan to lose at least 20 pounds.      Depression Screen PHQ 2/9 Scores 10/31/2021 02/10/2021 02/10/2021 09/20/2020 06/18/2018 10/23/2016 06/27/2015  PHQ - 2 Score 0 2 2 1  0 0 0  PHQ- 9 Score - 7 - - - - -  Exception Documentation - - Other- indicate reason in comment box - - - -    Fall Risk Fall Risk  10/31/2021 02/10/2021 09/20/2020 12/22/2019 06/18/2018  Falls in the past year? 0 0 0 0 No  Number falls in past yr: 0 0 0 0 -  Injury with Fall? 0 - 0 0 -  Risk for fall due to : No Fall Risks No Fall Risks No Fall Risks - -  Risk for fall due to: Comment - - - - -  Follow up Falls prevention discussed - Falls evaluation completed;Education provided - -    FALL RISK PREVENTION PERTAINING TO THE HOME:  Any stairs in or around the home? Yes  If so, are there any without handrails? No  Home free of loose throw rugs in walkways, pet beds, electrical cords, etc? Yes  Adequate lighting in your home to reduce risk of falls? Yes   ASSISTIVE DEVICES UTILIZED TO PREVENT FALLS:  Life alert? No  Use of a cane, walker or w/c? No  Grab bars in the bathroom? No  Shower chair or bench in shower? Yes  Elevated toilet seat or a handicapped toilet? No   TIMED UP AND GO:  Was the test performed? No .  Length of time to ambulate 10 feet: n/a sec.   Gait steady and fast without use of assistive device  Cognitive Function: Normal cognitive status assessed by direct observation by this Nurse Health Advisor. No abnormalities found.   MMSE - Mini Mental State Exam 06/27/2015  Not completed: Unable to complete     6CIT Screen 09/20/2020  What Year? 0 points  What month? 0 points  What time? 0 points  Count back from 20 0 points  Months in reverse 0 points  Repeat phrase 0 points  Total Score 0    Immunizations Immunization History  Administered Date(s) Administered   Fluad Quad(high Dose 65+) 07/29/2019, 09/20/2020, 07/18/2021   Influenza Split 08/03/2011, 09/09/2012    Influenza Whole 07/05/2010   Influenza, High Dose Seasonal PF 08/17/2016, 07/23/2017, 09/19/2018   Influenza,inj,Quad PF,6+ Mos 07/10/2013, 07/28/2014, 09/29/2015   PFIZER Comirnaty(Gray Top)Covid-19 Tri-Sucrose Vaccine 03/29/2021   PFIZER(Purple Top)SARS-COV-2 Vaccination 12/26/2019, 01/18/2020, 08/27/2020   Pfizer Covid-19 Vaccine Bivalent Booster 64yrs & up 08/01/2021   Pneumococcal Conjugate-13 03/31/2015   Pneumococcal Polysaccharide-23 11/15/2014   Tdap 07/02/2012   Zoster, Live 07/15/2012    TDAP status: Up to date  Flu Vaccine status: Up to date  Pneumococcal vaccine status: Up to date  Covid-19 vaccine status: Completed vaccines  Qualifies for Shingles Vaccine? Yes   Zostavax completed Yes   Shingrix Completed?: No.    Education has been provided regarding the importance of this vaccine. Patient has been advised to call insurance company to determine out of pocket expense if they have not yet received this vaccine. Advised may also receive vaccine at local pharmacy or Health Dept. Verbalized acceptance and understanding.  Screening Tests Health Maintenance  Topic Date Due   Zoster Vaccines- Shingrix (1 of 2) Never done   MAMMOGRAM  11/23/2021   TETANUS/TDAP  07/02/2022   COLONOSCOPY (Pts 45-38yrs Insurance coverage will need to be confirmed)  12/24/2023   Pneumonia Vaccine 77+ Years old  Completed   INFLUENZA VACCINE  Completed   DEXA SCAN  Completed   COVID-19 Vaccine  Completed   Hepatitis C Screening  Completed   HPV VACCINES  Aged Out    Health Maintenance  Health Maintenance Due  Topic Date Due  Zoster Vaccines- Shingrix (1 of 2) Never done    Colorectal cancer screening: Type of screening: Colonoscopy. Completed 12/23/2013. Repeat every 10 years  Mammogram status: Completed 12/2020. Repeat every year  Bone Density status: Completed 10/23/2016. Results reflect: Bone density results: OSTEOPENIA. Repeat every 2-3 years.  Lung Cancer Screening: (Low  Dose CT Chest recommended if Age 53-80 years, 30 pack-year currently smoking OR have quit w/in 15years.) does not qualify.   Lung Cancer Screening Referral: no  Additional Screening:  Hepatitis C Screening: does qualify; Completed yes  Vision Screening: Recommended annual ophthalmology exams for early detection of glaucoma and other disorders of the eye. Is the patient up to date with their annual eye exam?  Yes  Who is the provider or what is the name of the office in which the patient attends annual eye exams? Clent Jacks, MD. If pt is not established with a provider, would they like to be referred to a provider to establish care? No .   Dental Screening: Recommended annual dental exams for proper oral hygiene  Community Resource Referral / Chronic Care Management: CRR required this visit?  No   CCM required this visit?  No      Plan:     I have personally reviewed and noted the following in the patients chart:   Medical and social history Use of alcohol, tobacco or illicit drugs  Current medications and supplements including opioid prescriptions.  Functional ability and status Nutritional status Physical activity Advanced directives List of other physicians Hospitalizations, surgeries, and ER visits in previous 12 months Vitals Screenings to include cognitive, depression, and falls Referrals and appointments  In addition, I have reviewed and discussed with patient certain preventive protocols, quality metrics, and best practice recommendations. A written personalized care plan for preventive services as well as general preventive health recommendations were provided to patient.     Sheral Flow, LPN   [id number]   Nurse Notes:  Patient is cogitatively intact. There were no vitals filed for this visit. There is no height or weight on file to calculate BMI. Patient stated that she has no issues with gait or balance; does not use any assistive  devices. Medications reviewed with patient; no opioid use noted.    Medical screening examination/treatment/procedure(s) were performed by non-physician practitioner and as supervising physician I was immediately available for consultation/collaboration.  I agree with above. Lew Dawes, MD

## 2021-11-09 ENCOUNTER — Other Ambulatory Visit: Payer: Self-pay | Admitting: Internal Medicine

## 2021-11-23 ENCOUNTER — Encounter: Payer: Self-pay | Admitting: Sports Medicine

## 2021-11-23 ENCOUNTER — Other Ambulatory Visit: Payer: Self-pay

## 2021-11-23 ENCOUNTER — Ambulatory Visit (INDEPENDENT_AMBULATORY_CARE_PROVIDER_SITE_OTHER): Payer: Medicare HMO | Admitting: Sports Medicine

## 2021-11-23 DIAGNOSIS — Z79899 Other long term (current) drug therapy: Secondary | ICD-10-CM | POA: Diagnosis not present

## 2021-11-23 DIAGNOSIS — B351 Tinea unguium: Secondary | ICD-10-CM | POA: Diagnosis not present

## 2021-11-23 DIAGNOSIS — B372 Candidiasis of skin and nail: Secondary | ICD-10-CM

## 2021-11-23 MED ORDER — NONFORMULARY OR COMPOUNDED ITEM: 0 refills | Status: DC

## 2021-11-23 MED ORDER — FLUCONAZOLE 200 MG PO TABS: 200.0000 mg | ORAL_TABLET | Freq: Every day | ORAL | 0 refills | Status: DC

## 2021-11-23 NOTE — Progress Notes (Signed)
Subjective: Jane Mooney is a 71 y.o. female patient seen today in office for fungal culture results. Patient has no other pedal complaints at this time.   Patient Active Problem List   Diagnosis Date Noted   Incidental lung nodule, > 36mm and < 93mm 10/17/2021   Restrictive lung disease 09/27/2021   Diffusion capacity of lung (dl), decreased 09/27/2021   Ankle pain, right 09/05/2021   Statin declined 09/05/2021   Cough 05/02/2021   Goiter 05/02/2021   Right hip pain 02/10/2021   Achilles tendinitis 12/20/2020   Memory problem 12/20/2020   Pain in surgical scar 09/20/2020   Seasonal and perennial allergic rhinitis 03/31/2020   Decreased GFR 12/22/2019   CRI (chronic renal insufficiency), stage 3 (moderate) ([place]) 09/26/2019   Abscess of face 06/13/2017   Constipation 02/22/2017   Chest pain, atypical 11/30/2016   GERD (gastroesophageal reflux disease) 11/30/2016   Menopause 10/23/2016   Abscess of finger, left 08/17/2016   Thoracic back pain 09/29/2015   Osteoarthritis of both knees 09/29/2015   Allergic urticaria 03/29/2014   Grief 03/22/2014   Well adult exam 07/02/2012   Obesity 03/06/2012   Cervical pain (neck) 12/28/2011   Neoplasm of uncertain behavior of skin 08/03/2011   Onychomycosis 08/03/2011   Vitamin B 12 deficiency 05/07/2011   Hemorrhoid 05/03/2011   Dyslipidemia 10/31/2010   TRIGGER FINGER 03/30/2010   WEIGHT LOSS 12/15/2009   LOW BACK PAIN 10/25/2009   HYPERGLYCEMIA 10/25/2009   Vitamin D deficiency 02/24/2009   VERTIGO 04/09/2008   KNEE PAIN 03/30/2008   ALLERGIC RHINITIS 02/13/2008   Pain in joint, shoulder region 02/13/2008   Anxiety state 09/30/2007   Situational depression 09/30/2007   EAR PAIN 09/30/2007   Essential hypertension 09/30/2007    Current Outpatient Medications on File Prior to Visit  Medication Sig Dispense Refill   acetaminophen (TYLENOL) 500 MG tablet Take 1,000 mg by mouth every 6 (six) hours as needed for moderate pain or  headache.     amLODipine (NORVASC) 5 MG tablet TAKE 1 TABLET BY MOUTH EVERY DAY 90 tablet 3   cetirizine (ZYRTEC) 10 MG tablet Take 1 tablet (10 mg total) by mouth 2 (two) times daily. 180 tablet 1   cholecalciferol (VITAMIN D) 1000 units tablet Take 2 tablets (2,000 Units total) by mouth daily. (Patient taking differently: Take 1,000 Units by mouth daily.) 100 tablet 11   Cyanocobalamin (VITAMIN B-12) 1000 MCG SUBL Place 1 tablet (1,000 mcg total) under the tongue daily. 100 tablet 3   famotidine (PEPCID) 20 MG tablet Take 1 tablet (20 mg total) by mouth daily. 90 tablet 3   fish oil-omega-3 fatty acids 1000 MG capsule Take 1 g by mouth daily.     fluticasone (FLONASE) 50 MCG/ACT nasal spray Place 1 spray into both nostrils daily as needed for allergies. 16 g 5   HYDROcodone bit-homatropine (HYCODAN) 5-1.5 MG/5ML syrup Take 5 mLs by mouth every 6 (six) hours as needed for cough. 240 mL 0   hydrOXYzine (ATARAX) 25 MG tablet TAKE 1-2 TABLETS AT NIGHT AS NEEDED FOR ITCHING. CAUTION AS THIS MEDICATION CAN MAKE YOU DROWSY 180 tablet 2   LORazepam (ATIVAN) 1 MG tablet TAKE 1 TABLET BY MOUTH TWICE A DAY AS NEEDED FOR ANXIETY 60 tablet 3   meloxicam (MOBIC) 15 MG tablet TAKE 1 TABLET (15 MG TOTAL) BY MOUTH DAILY. 30 tablet 0   Polyethylene Glycol 3350 (PEG 3350) 17 GM/SCOOP POWD TAKE 17 G BY MOUTH 2 (TWO) TIMES DAILY AS NEEDED.  510 g 1   telmisartan (MICARDIS) 80 MG tablet TAKE 1 TABLET BY MOUTH EVERY DAY 90 tablet 3   triamcinolone ointment (KENALOG) 0.1 % Apply 1 application topically 4 (four) times daily. (Patient taking differently: Apply 1 application topically 4 (four) times daily as needed (hemorrhoids).) 80 g 1   triamterene-hydrochlorothiazide (MAXZIDE-25) 37.5-25 MG tablet TAKE 1/2 TABLET BY MOUTH EVERY DAY 90 tablet 3   venlafaxine XR (EFFEXOR XR) 75 MG 24 hr capsule Take 1 capsule (75 mg total) by mouth daily with breakfast. 90 capsule 1   No current facility-administered medications on file  prior to visit.    Allergies  Allergen Reactions   Citalopram     weak   Mirtazapine     Made pt very groggy   Quinapril Hcl Cough    Objective: Physical Exam  General: Well developed, nourished, no acute distress, awake, alert and oriented x 3  Dermatology: No open lesions bilateral lower extremities, no webspace macerations, no ecchymosis bilateral, all nails are thickened and elongated with subungual debris with most involvement bilateral hallux concerning for onychomycosis. Old surgical scar on right.   Vascular: Dorsalis Pedis and Posterior Tibial pedal pulses palpable, Capillary Fill Time 3 seconds,(+) pedal hair growth bilateral, no edema bilateral lower extremities, Temperature gradient within normal limits.   Neurology: Gross sensation intact via light touch bilateral.   Musculoskeletal: No symptomatic complaints at this time.  Fungal culture + yeast  and Microtrauma   Assessment and Plan:  Problem List Items Addressed This Visit   None Visit Diagnoses     Nail fungus    -  Primary   Relevant Medications   fluconazole (DIFLUCAN) 200 MG tablet   Encounter for long-term current use of medication       Relevant Orders   Hepatic Function Panel   Candida onychomycosis       Relevant Medications   fluconazole (DIFLUCAN) 200 MG tablet         -Examined patient -Discussed treatment options for painful mycotic nails -Patient opt for oral Fluconazole with full understanding of medication risks; ordered LFTs for review -Patient opt for topical as well as I have recommend of Urea to the nails; Rx sent to [facility] good hygiene habits -Patient to return in 4-6 weeks for follow up evaluation or sooner if symptoms worsen.  Landis Martins, DPM

## 2021-11-30 ENCOUNTER — Encounter: Payer: Self-pay | Admitting: Sports Medicine

## 2021-11-30 NOTE — Telephone Encounter (Signed)
Please advise 

## 2021-12-01 NOTE — Telephone Encounter (Signed)
Returned the call to patient,no answer,left vmessage of Dr Leeanne Rio recommendations.

## 2021-12-04 NOTE — Telephone Encounter (Signed)
Called and notified patient, verbalized understanding and will come by office to purchase.

## 2021-12-07 ENCOUNTER — Other Ambulatory Visit (HOSPITAL_BASED_OUTPATIENT_CLINIC_OR_DEPARTMENT_OTHER): Payer: Self-pay

## 2021-12-07 MED ORDER — ZOSTER VAC RECOMB ADJUVANTED 50 MCG/0.5ML IM SUSR
INTRAMUSCULAR | 1 refills | Status: DC
  Filled 2021-12-07: qty 0.5, 1d supply, fill #0

## 2021-12-12 ENCOUNTER — Encounter: Payer: Self-pay | Admitting: Internal Medicine

## 2021-12-12 ENCOUNTER — Ambulatory Visit (INDEPENDENT_AMBULATORY_CARE_PROVIDER_SITE_OTHER): Payer: Medicare HMO | Admitting: Internal Medicine

## 2021-12-12 ENCOUNTER — Other Ambulatory Visit: Payer: Self-pay

## 2021-12-12 DIAGNOSIS — B351 Tinea unguium: Secondary | ICD-10-CM

## 2021-12-12 DIAGNOSIS — I700 Atherosclerosis of aorta: Secondary | ICD-10-CM | POA: Insufficient documentation

## 2021-12-12 DIAGNOSIS — I2583 Coronary atherosclerosis due to lipid rich plaque: Secondary | ICD-10-CM | POA: Diagnosis not present

## 2021-12-12 DIAGNOSIS — F4321 Adjustment disorder with depressed mood: Secondary | ICD-10-CM

## 2021-12-12 DIAGNOSIS — R944 Abnormal results of kidney function studies: Secondary | ICD-10-CM

## 2021-12-12 DIAGNOSIS — M170 Bilateral primary osteoarthritis of knee: Secondary | ICD-10-CM

## 2021-12-12 DIAGNOSIS — N183 Chronic kidney disease, stage 3 unspecified: Secondary | ICD-10-CM | POA: Diagnosis not present

## 2021-12-12 DIAGNOSIS — R911 Solitary pulmonary nodule: Secondary | ICD-10-CM

## 2021-12-12 DIAGNOSIS — I2510 Atherosclerotic heart disease of native coronary artery without angina pectoris: Secondary | ICD-10-CM

## 2021-12-12 LAB — CBC WITH DIFFERENTIAL/PLATELET
Basophils Absolute: 0 10*3/uL (ref 0.0–0.1)
Basophils Relative: 0.6 % (ref 0.0–3.0)
Eosinophils Absolute: 0.1 10*3/uL (ref 0.0–0.7)
Eosinophils Relative: 1.8 % (ref 0.0–5.0)
HCT: 38.3 % (ref 36.0–46.0)
Hemoglobin: 12.4 g/dL (ref 12.0–15.0)
Lymphocytes Relative: 29.7 % (ref 12.0–46.0)
Lymphs Abs: 1.7 10*3/uL (ref 0.7–4.0)
MCHC: 32.3 g/dL (ref 30.0–36.0)
MCV: 79.3 fl (ref 78.0–100.0)
Monocytes Absolute: 0.7 10*3/uL (ref 0.1–1.0)
Monocytes Relative: 12.3 % — ABNORMAL HIGH (ref 3.0–12.0)
Neutro Abs: 3.3 10*3/uL (ref 1.4–7.7)
Neutrophils Relative %: 55.6 % (ref 43.0–77.0)
Platelets: 226 10*3/uL (ref 150.0–400.0)
RBC: 4.83 Mil/uL (ref 3.87–5.11)
RDW: 14.4 % (ref 11.5–15.5)
WBC: 5.9 10*3/uL (ref 4.0–10.5)

## 2021-12-12 LAB — COMPREHENSIVE METABOLIC PANEL
ALT: 9 U/L (ref 0–35)
AST: 17 U/L (ref 0–37)
Albumin: 4.3 g/dL (ref 3.5–5.2)
Alkaline Phosphatase: 84 U/L (ref 39–117)
BUN: 23 mg/dL (ref 6–23)
CO2: 31 mEq/L (ref 19–32)
Calcium: 9.8 mg/dL (ref 8.4–10.5)
Chloride: 100 mEq/L (ref 96–112)
Creatinine, Ser: 1.22 mg/dL — ABNORMAL HIGH (ref 0.40–1.20)
GFR: 44.36 mL/min — ABNORMAL LOW (ref >60.00)
Glucose, Bld: 110 mg/dL — ABNORMAL HIGH (ref 70–99)
Potassium: 4.3 mEq/L (ref 3.5–5.1)
Sodium: 136 mEq/L (ref 135–145)
Total Bilirubin: 0.5 mg/dL (ref 0.2–1.2)
Total Protein: 7.7 g/dL (ref 6.0–8.3)

## 2021-12-12 MED ORDER — ASPIRIN EC 81 MG PO TBEC: 81.0000 mg | DELAYED_RELEASE_TABLET | Freq: Every day | ORAL | 3 refills | Status: AC

## 2021-12-12 MED ORDER — VENLAFAXINE HCL ER 37.5 MG PO CP24: 37.5000 mg | ORAL_CAPSULE | Freq: Every day | ORAL | 0 refills | Status: DC

## 2021-12-12 NOTE — Assessment & Plan Note (Signed)
Try HOKA bondi

## 2021-12-12 NOTE — Assessment & Plan Note (Signed)
Toenails - Dr Cannon Kettle. On Diflucan 90 days Check CBC, LFT

## 2021-12-12 NOTE — Assessment & Plan Note (Signed)
Hydrate well 

## 2021-12-12 NOTE — Assessment & Plan Note (Signed)
11/22 chest CT: Aortic atherosclerosis, in addition to left main and 2 vessel coronary artery disease. ASA 81mg /d Fish oil Pt declined statins Card ref was offered

## 2021-12-12 NOTE — Progress Notes (Signed)
Subjective:  Patient ID: Jane Mooney, female    DOB: Aug 31, 1951  Age: 71 y.o. MRN: [id number]  CC: Follow-up (3 month f/u)   HPI Jane Mooney presents for HTN, CRI, pulm nodule on chest CT    Outpatient Medications Prior to Visit  Medication Sig Dispense Refill   acetaminophen (TYLENOL) 500 MG tablet Take 1,000 mg by mouth every 6 (six) hours as needed for moderate pain or headache.     amLODipine (NORVASC) 5 MG tablet TAKE 1 TABLET BY MOUTH EVERY DAY 90 tablet 3   cetirizine (ZYRTEC) 10 MG tablet Take 1 tablet (10 mg total) by mouth 2 (two) times daily. 180 tablet 1   cholecalciferol (VITAMIN D) 1000 units tablet Take 2 tablets (2,000 Units total) by mouth daily. (Patient taking differently: Take 1,000 Units by mouth daily.) 100 tablet 11   Cyanocobalamin (VITAMIN B-12) 1000 MCG SUBL Place 1 tablet (1,000 mcg total) under the tongue daily. 100 tablet 3   famotidine (PEPCID) 20 MG tablet Take 1 tablet (20 mg total) by mouth daily. 90 tablet 3   fish oil-omega-3 fatty acids 1000 MG capsule Take 1 g by mouth daily.     fluconazole (DIFLUCAN) 200 MG tablet Take 1 tablet (200 mg total) by mouth daily. 90 tablet 0   fluticasone (FLONASE) 50 MCG/ACT nasal spray Place 1 spray into both nostrils daily as needed for allergies. 16 g 5   hydrOXYzine (ATARAX) 25 MG tablet TAKE 1-2 TABLETS AT NIGHT AS NEEDED FOR ITCHING. CAUTION AS THIS MEDICATION CAN MAKE YOU DROWSY 180 tablet 2   LORazepam (ATIVAN) 1 MG tablet TAKE 1 TABLET BY MOUTH TWICE A DAY AS NEEDED FOR ANXIETY 60 tablet 3   meloxicam (MOBIC) 15 MG tablet TAKE 1 TABLET (15 MG TOTAL) BY MOUTH DAILY. 30 tablet 0   NONFORMULARY OR COMPOUNDED ITEM [facility]  Antifungal nail #1 Apply to the affected nail (s) once (at bedtime) or twice daily Refills PRN 1 each 0   Polyethylene Glycol 3350 (PEG 3350) 17 GM/SCOOP POWD TAKE 17 G BY MOUTH 2 (TWO) TIMES DAILY AS NEEDED. 510 g 1   telmisartan (MICARDIS) 80 MG tablet TAKE 1 TABLET BY MOUTH  EVERY DAY 90 tablet 3   triamcinolone ointment (KENALOG) 0.1 % Apply 1 application topically 4 (four) times daily. (Patient taking differently: Apply 1 application topically 4 (four) times daily as needed (hemorrhoids).) 80 g 1   triamterene-hydrochlorothiazide (MAXZIDE-25) 37.5-25 MG tablet TAKE 1/2 TABLET BY MOUTH EVERY DAY 90 tablet 3   Zoster Vaccine Adjuvanted [facility]) injection Inject into the muscle. 0.5 mL 1   HYDROcodone bit-homatropine (HYCODAN) 5-1.5 MG/5ML syrup Take 5 mLs by mouth every 6 (six) hours as needed for cough. 240 mL 0   venlafaxine XR (EFFEXOR XR) 75 MG 24 hr capsule Take 1 capsule (75 mg total) by mouth daily with breakfast. 90 capsule 1   No facility-administered medications prior to visit.    ROS: Review of Systems  Constitutional:  Negative for activity change, appetite change, chills, fatigue and unexpected weight change.  HENT:  Negative for congestion, mouth sores and sinus pressure.   Eyes:  Negative for visual disturbance.  Respiratory:  Negative for cough and chest tightness.   Cardiovascular:  Negative for chest pain, palpitations and leg swelling.  Gastrointestinal:  Negative for abdominal pain and nausea.  Genitourinary:  Negative for difficulty urinating, frequency and vaginal pain.  Musculoskeletal:  Positive for arthralgias and gait problem. Negative for back pain.  Skin:  Negative for pallor and rash.  Neurological:  Negative for dizziness, tremors, weakness, numbness and headaches.  Psychiatric/Behavioral:  Negative for confusion, sleep disturbance and suicidal ideas. The patient is nervous/anxious.    Objective:  BP 112/60 (BP Location: Left Arm)    Pulse 75    Temp 98.3 F (36.8 C) (Oral)    Ht 5' 3.5" (1.613 m)    Wt 178 lb 6.4 oz (80.9 kg)    SpO2 97%    BMI 31.11 kg/m   BP Readings from Last 3 Encounters:  12/12/21 112/60  10/17/21 140/68  09/26/21 124/70    Wt Readings from Last 3 Encounters:  12/12/21 178 lb 6.4 oz (80.9 kg)   10/17/21 181 lb 6.4 oz (82.3 kg)  09/26/21 180 lb 6.4 oz (81.8 kg)    Physical Exam Constitutional:      General: She is not in acute distress.    Appearance: She is well-developed. She is obese.  HENT:     Head: Normocephalic.     Right Ear: External ear normal.     Left Ear: External ear normal.     Nose: Nose normal.  Eyes:     General:        Right eye: No discharge.        Left eye: No discharge.     Conjunctiva/sclera: Conjunctivae normal.     Pupils: Pupils are equal, round, and reactive to light.  Neck:     Thyroid: No thyromegaly.     Vascular: No JVD.     Trachea: No tracheal deviation.  Cardiovascular:     Rate and Rhythm: Normal rate and regular rhythm.     Heart sounds: Normal heart sounds.  Pulmonary:     Effort: No respiratory distress.     Breath sounds: No stridor. No wheezing.  Abdominal:     General: Bowel sounds are normal. There is no distension.     Palpations: Abdomen is soft. There is no mass.     Tenderness: There is no abdominal tenderness. There is no guarding or rebound.  Musculoskeletal:        General: Tenderness present.     Cervical back: Normal range of motion and neck supple. No rigidity.  Lymphadenopathy:     Cervical: No cervical adenopathy.  Skin:    Findings: No erythema or rash.  Neurological:     Cranial Nerves: No cranial nerve deficit.     Motor: No abnormal muscle tone.     Coordination: Coordination normal.     Gait: Gait abnormal.     Deep Tendon Reflexes: Reflexes normal.  Psychiatric:        Behavior: Behavior normal.        Thought Content: Thought content normal.        Judgment: Judgment normal.  Knees w/pain  Lab Results  Component Value Date   WBC 4.7 08/30/2021   HGB 12.2 08/30/2021   HCT 37.8 08/30/2021   PLT 214.0 08/30/2021   GLUCOSE 107 (H) 08/30/2021   CHOL 249 (H) 08/30/2021   TRIG 113.0 08/30/2021   HDL 47.80 08/30/2021   LDLDIRECT 153.6 07/06/2013   LDLCALC 179 (H) 08/30/2021   ALT 9  08/30/2021   AST 16 08/30/2021   NA 139 08/30/2021   K 4.1 08/30/2021   CL 102 08/30/2021   CREATININE 1.09 08/30/2021   BUN 15 08/30/2021   CO2 30 08/30/2021   TSH 2.07 08/30/2021   INR 1.0 06/23/2020   HGBA1C  5.8 09/20/2020    CT Chest High Resolution  Result Date: 10/11/2021 CLINICAL DATA:  71 year old female with history of dry cough at night for several months. Evaluate for interstitial lung disease. EXAM: CT CHEST WITHOUT CONTRAST TECHNIQUE: Multidetector CT imaging of the chest was performed following the standard protocol without intravenous contrast. High resolution imaging of the lungs, as well as inspiratory and expiratory imaging, was performed. COMPARISON:  No priors. FINDINGS: Cardiovascular: Heart size is normal. There is no significant pericardial fluid, thickening or pericardial calcification. There is aortic atherosclerosis, as well as atherosclerosis of the great vessels of the mediastinum and the coronary arteries, including calcified atherosclerotic plaque in the left main, left anterior descending and right coronary arteries. Mediastinum/Nodes: No pathologically enlarged mediastinal or hilar lymph nodes. Esophagus is unremarkable in appearance. No axillary lymphadenopathy. Lungs/Pleura: High-resolution images demonstrate no significant regions of ground-glass attenuation, septal thickening, subpleural reticulation, parenchymal banding, traction bronchiectasis or frank honeycombing to indicate interstitial lung disease. Inspiratory and expiratory imaging demonstrates some mild air trapping indicative of mild small airways disease. No acute consolidative airspace disease. No pleural effusions. In the right middle lobe (axial image 82 of series 3) there is a 8 x 7 mm pulmonary nodule. Upper Abdomen: Unremarkable. Musculoskeletal: There are no aggressive appearing lytic or blastic lesions noted in the visualized portions of the skeleton. IMPRESSION: 1. No findings to suggest  interstitial lung disease. 2. 8 x 7 mm right middle lobe pulmonary nodule. Non-contrast chest CT at 6-12 months is recommended. If the nodule is stable at time of repeat CT, then future CT at 18-24 months (from today's scan) is considered optional for low-risk patients, but is recommended for high-risk patients. This recommendation follows the consensus statement: Guidelines for Management of Incidental Pulmonary Nodules Detected on CT Images: From the Fleischner Society 2017; Radiology 2017; 284:228-243. 3. Aortic atherosclerosis, in addition to left main and 2 vessel coronary artery disease. Please note that although the presence of coronary artery calcium documents the presence of coronary artery disease, the severity of this disease and any potential stenosis cannot be assessed on this non-gated CT examination. Assessment for potential risk factor modification, dietary therapy or pharmacologic therapy may be warranted, if clinically indicated. These results will be called to the ordering clinician or representative by the Radiologist Assistant, and communication documented in the PACS or [employer]. Aortic Atherosclerosis (ICD10-I70.0). Electronically Signed   By: Vinnie Langton M.D.   On: 10/11/2021 20:18    Assessment & Plan:   Problem List Items Addressed This Visit     Aortic atherosclerosis ([place])    11/22 chest CT: Aortic atherosclerosis, in addition to left main and 2 vessel coronary artery disease. ASA 81mg /d Fish oil Pt declined statins Card ref was offered      Relevant Medications   aspirin EC 81 MG tablet   Coronary atherosclerosis    11/22 chest CT: Aortic atherosclerosis, in addition to left main and 2 vessel coronary artery disease. ASA 81mg /d Fish oil Pt declined statins Card ref was offered      Relevant Medications   aspirin EC 81 MG tablet   CRI (chronic renal insufficiency), stage 3 (moderate) (HCC)    Check GFR Hydrate well      Decreased GFR     Hydrate well      Incidental lung nodule, > 75mm and < 38mm    CT Chest without contrast in 6 months (June 2023) F/u w/Dr Loanne Drilling      Onychomycosis  Toenails - Dr Cannon Kettle. On Diflucan 90 days Check CBC, LFT      Situational depression    Cont on Effexor XR Pt wants to stop now Discussed - we can wean off - new Rx for 37.5 was given      Relevant Medications   venlafaxine XR (EFFEXOR XR) 37.5 MG 24 hr capsule      Meds ordered this encounter  Medications   aspirin EC 81 MG tablet    Sig: Take 1 tablet (81 mg total) by mouth daily.    Dispense:  100 tablet    Refill:  3   venlafaxine XR (EFFEXOR XR) 37.5 MG 24 hr capsule    Sig: Take 1 capsule (37.5 mg total) by mouth daily with breakfast.    Dispense:  30 capsule    Refill:  0      Follow-up: No follow-ups on file.  [name], MD

## 2021-12-12 NOTE — Assessment & Plan Note (Addendum)
Cont on Effexor XR Pt wants to stop now Discussed - we can wean off - new Rx for 37.5 was given

## 2021-12-12 NOTE — Assessment & Plan Note (Signed)
Check GFR Hydrate well 

## 2021-12-12 NOTE — Patient Instructions (Signed)
Jane Mooney

## 2021-12-12 NOTE — Assessment & Plan Note (Addendum)
11/22 chest CT: Aortic atherosclerosis, in addition to left main and 2 vessel coronary artery disease. ASA 81mg /d Fish oil Pt declined statins Card ref was offered

## 2021-12-12 NOTE — Assessment & Plan Note (Signed)
CT Chest without contrast in 6 months (June 2023) F/u w/Dr Loanne Drilling

## 2021-12-13 ENCOUNTER — Other Ambulatory Visit: Payer: Self-pay | Admitting: Internal Medicine

## 2021-12-13 DIAGNOSIS — N183 Chronic kidney disease, stage 3 unspecified: Secondary | ICD-10-CM

## 2021-12-13 DIAGNOSIS — R944 Abnormal results of kidney function studies: Secondary | ICD-10-CM

## 2021-12-27 ENCOUNTER — Other Ambulatory Visit: Payer: Self-pay | Admitting: Internal Medicine

## 2022-01-02 ENCOUNTER — Encounter: Payer: Self-pay | Admitting: Internal Medicine

## 2022-01-04 ENCOUNTER — Ambulatory Visit: Payer: Medicare HMO | Admitting: Sports Medicine

## 2022-01-04 ENCOUNTER — Other Ambulatory Visit: Payer: Self-pay

## 2022-01-04 DIAGNOSIS — Z79899 Other long term (current) drug therapy: Secondary | ICD-10-CM | POA: Diagnosis not present

## 2022-01-04 DIAGNOSIS — B372 Candidiasis of skin and nail: Secondary | ICD-10-CM

## 2022-01-04 DIAGNOSIS — B351 Tinea unguium: Secondary | ICD-10-CM

## 2022-01-04 NOTE — Progress Notes (Signed)
Subjective: Jane Mooney is a 71 y.o. female patient seen today in office for follow up evaluation of nail fungus secondary to Candida onychomycosis currently on fluconazole states that it is making her constipated she is not sure if it is the medicine or something else she did make a phone call to her PCP for further recommendation still is currently taking the fluconazole as prescribed.  Patient reports that she cannot see any difference yet with her toenails especially the big toenails and states that she is using the topical over-the-counter  Tocylen nail solution twice a day that she has purchased from the front desk.  Patient has no other pedal complaints at this time.   Patient Active Problem List   Diagnosis Date Noted   Aortic atherosclerosis ([place]) 12/12/2021   Coronary atherosclerosis 12/12/2021   Incidental lung nodule, > 46mm and < 63mm 10/17/2021   Restrictive lung disease 09/27/2021   Diffusion capacity of lung (dl), decreased 09/27/2021   Ankle pain, right 09/05/2021   Statin declined 09/05/2021   Cough 05/02/2021   Goiter 05/02/2021   Right hip pain 02/10/2021   Achilles tendinitis 12/20/2020   Memory problem 12/20/2020   Pain in surgical scar 09/20/2020   Seasonal and perennial allergic rhinitis 03/31/2020   Decreased GFR 12/22/2019   CRI (chronic renal insufficiency), stage 3 (moderate) ([place]) 09/26/2019   Abscess of face 06/13/2017   Constipation 02/22/2017   Chest pain, atypical 11/30/2016   GERD (gastroesophageal reflux disease) 11/30/2016   Menopause 10/23/2016   Abscess of finger, left 08/17/2016   Thoracic back pain 09/29/2015   Osteoarthritis of both knees 09/29/2015   Allergic urticaria 03/29/2014   Grief 03/22/2014   Well adult exam 07/02/2012   Obesity 03/06/2012   Cervical pain (neck) 12/28/2011   Neoplasm of uncertain behavior of skin 08/03/2011   Onychomycosis 08/03/2011   Vitamin B 12 deficiency 05/07/2011   Hemorrhoid 05/03/2011   Dyslipidemia  10/31/2010   TRIGGER FINGER 03/30/2010   WEIGHT LOSS 12/15/2009   LOW BACK PAIN 10/25/2009   HYPERGLYCEMIA 10/25/2009   Vitamin D deficiency 02/24/2009   VERTIGO 04/09/2008   KNEE PAIN 03/30/2008   ALLERGIC RHINITIS 02/13/2008   Pain in joint, shoulder region 02/13/2008   Anxiety state 09/30/2007   Situational depression 09/30/2007   EAR PAIN 09/30/2007   Essential hypertension 09/30/2007    Current Outpatient Medications on File Prior to Visit  Medication Sig Dispense Refill   acetaminophen (TYLENOL) 500 MG tablet Take 1,000 mg by mouth every 6 (six) hours as needed for moderate pain or headache.     amLODipine (NORVASC) 5 MG tablet TAKE 1 TABLET BY MOUTH EVERY DAY 90 tablet 3   aspirin EC 81 MG tablet Take 1 tablet (81 mg total) by mouth daily. 100 tablet 3   cetirizine (ZYRTEC) 10 MG tablet Take 1 tablet (10 mg total) by mouth 2 (two) times daily. 180 tablet 1   cholecalciferol (VITAMIN D) 1000 units tablet Take 2 tablets (2,000 Units total) by mouth daily. (Patient taking differently: Take 1,000 Units by mouth daily.) 100 tablet 11   Cyanocobalamin (VITAMIN B-12) 1000 MCG SUBL Place 1 tablet (1,000 mcg total) under the tongue daily. 100 tablet 3   famotidine (PEPCID) 20 MG tablet Take 1 tablet (20 mg total) by mouth daily. 90 tablet 3   fish oil-omega-3 fatty acids 1000 MG capsule Take 1 g by mouth daily.     fluconazole (DIFLUCAN) 200 MG tablet Take 1 tablet (200 mg total) by  mouth daily. 90 tablet 0   fluticasone (FLONASE) 50 MCG/ACT nasal spray Place 1 spray into both nostrils daily as needed for allergies. 16 g 5   hydrOXYzine (ATARAX) 25 MG tablet TAKE 1-2 TABLETS AT NIGHT AS NEEDED FOR ITCHING. CAUTION AS THIS MEDICATION CAN MAKE YOU DROWSY 180 tablet 2   LORazepam (ATIVAN) 1 MG tablet TAKE 1 TABLET BY MOUTH TWICE A DAY AS NEEDED FOR ANXIETY 60 tablet 3   meloxicam (MOBIC) 15 MG tablet TAKE 1 TABLET (15 MG TOTAL) BY MOUTH DAILY. 30 tablet 0   NONFORMULARY OR COMPOUNDED ITEM  [facility]  Antifungal nail #1 Apply to the affected nail (s) once (at bedtime) or twice daily Refills PRN 1 each 0   Polyethylene Glycol 3350 (PEG 3350) 17 GM/SCOOP POWD TAKE 17 G BY MOUTH 2 (TWO) TIMES DAILY AS NEEDED. 510 g 1   telmisartan (MICARDIS) 80 MG tablet TAKE 1 TABLET BY MOUTH EVERY DAY 90 tablet 3   triamcinolone ointment (KENALOG) 0.1 % Apply 1 application topically 4 (four) times daily. (Patient taking differently: Apply 1 application topically 4 (four) times daily as needed (hemorrhoids).) 80 g 1   triamterene-hydrochlorothiazide (MAXZIDE-25) 37.5-25 MG tablet TAKE 1/2 TABLET BY MOUTH EVERY DAY 90 tablet 3   venlafaxine XR (EFFEXOR XR) 37.5 MG 24 hr capsule Take 1 capsule (37.5 mg total) by mouth daily with breakfast. 30 capsule 0   Zoster Vaccine Adjuvanted [facility]) injection Inject into the muscle. 0.5 mL 1   No current facility-administered medications on file prior to visit.    Allergies  Allergen Reactions   Citalopram     weak   Mirtazapine     Made pt very groggy   Quinapril Hcl Cough    Objective: Physical Exam  General: Well developed, nourished, no acute distress, awake, alert and oriented x 3  Dermatology: No open lesions bilateral lower extremities, no webspace macerations, no ecchymosis bilateral, all nails are thickened and elongated with subungual debris with most involvement bilateral hallux as previously noted.  Old surgical scar on right.   Vascular: Dorsalis Pedis and Posterior Tibial pedal pulses palpable, Capillary Fill Time 3 seconds,(+) pedal hair growth bilateral, no edema bilateral lower extremities, Temperature gradient within normal limits.   Neurology: Johney Maine sensation intact via light touch bilateral.   Musculoskeletal: Asymptomatic pes planus deformity.  Assessment and Plan:  Problem List Items Addressed This Visit   None Visit Diagnoses     Nail fungus    -  Primary   Relevant Orders   Hepatic Function Panel   Candida  onychomycosis       Encounter for long-term current use of medication           -Examined patient -Advised patient to reduce taking the fluconazole to every other day to see if this will help with her constipation until she can get further recommendations from her PCP.  A new set of LFTs were ordered; will call patient to stop medication if abnormal  -Advised good hygiene habits and educated patient on proper foot care to prevent re-infection -Topical Tocelyn nail gel -At no additional charge mechanically debrided all nails x10 and smoothed them with rotary bur without incident -Patient to return in 6 weeks for follow up evaluation or sooner if symptoms worsen.  Landis Martins, DPM

## 2022-01-10 ENCOUNTER — Other Ambulatory Visit: Payer: Self-pay | Admitting: Internal Medicine

## 2022-01-10 ENCOUNTER — Encounter: Payer: Self-pay | Admitting: Internal Medicine

## 2022-01-18 ENCOUNTER — Ambulatory Visit: Payer: Medicare HMO | Admitting: Allergy & Immunology

## 2022-01-18 ENCOUNTER — Encounter: Payer: Self-pay | Admitting: Allergy & Immunology

## 2022-01-18 ENCOUNTER — Other Ambulatory Visit: Payer: Self-pay

## 2022-01-18 VITALS — BP 112/50 | HR 63 | Temp 98.2°F | Resp 20 | Ht 63.0 in | Wt 180.2 lb

## 2022-01-18 DIAGNOSIS — J302 Other seasonal allergic rhinitis: Secondary | ICD-10-CM

## 2022-01-18 DIAGNOSIS — L500 Allergic urticaria: Secondary | ICD-10-CM | POA: Diagnosis not present

## 2022-01-18 DIAGNOSIS — J3089 Other allergic rhinitis: Secondary | ICD-10-CM

## 2022-01-18 MED ORDER — CETIRIZINE HCL 10 MG PO TABS: 10.0000 mg | ORAL_TABLET | Freq: Every day | ORAL | 3 refills | Status: DC

## 2022-01-18 NOTE — Progress Notes (Signed)
? ?FOLLOW UP ? ?Date of Service/Encounter:  01/18/22 ? ? ?Assessment:  ? ?Seasonal and perennial allergic rhinitis (grasses, ragweed, weeds, trees, indoor molds, outdoor molds, dust mites and cat) - well controlled with medications alone ?  ?Allergic urticaria - decreasing antihistamines over time ? ?Pulmonary nodules - monitored by Dr. Loanne Drilling ? ?Plan/Recommendations:  ? ?1. Seasonal and perennial allergic rhinitis (grasses, ragweed, weeds, trees, indoor molds, outdoor molds, dust mites and cat) ?- Continue with: Zyrtec (cetirizine) '10mg'$  once daily and Flonase (fluticasone) one spray per nostril daily ?- I think we can avoid allergy shots for now.   ?  ?2. Allergic urticaria ?- Previous lab workup was fairly normal. ? - Morning: Zyrtec (cetirizine) '10mg'$  (one tablet)  ? - Evening: hydroxyzine 1-2 tablets at night ?- You can change this dosing at home, decreasing the dose as needed or increasing the dosing as needed.  ? ?3. Return in about 1 year (around 01/19/2023).  ? ? ? ? ?Subjective:  ? ?Jane Mooney is a 71 y.o. female presenting today for follow up of  ?Chief Complaint  ?Patient presents with  ? Follow-up  ?  Haven't had any hives since the last time.  ? Allergic Rhinitis   ?  Fine no problem  ? ? ?Jane Mooney has a history of the following: ?Patient Active Problem List  ? Diagnosis Date Noted  ? Aortic atherosclerosis ([place]) 12/12/2021  ? Coronary atherosclerosis 12/12/2021  ? Incidental lung nodule, > 44m and < 869m12/04/2021  ? Restrictive lung disease 09/27/2021  ? Diffusion capacity of lung (dl), decreased 09/27/2021  ? Ankle pain, right 09/05/2021  ? Statin declined 09/05/2021  ? Cough 05/02/2021  ? Goiter 05/02/2021  ? Right hip pain 02/10/2021  ? Achilles tendinitis 12/20/2020  ? Memory problem 12/20/2020  ? Pain in surgical scar 09/20/2020  ? Seasonal and perennial allergic rhinitis 03/31/2020  ? Decreased GFR 12/22/2019  ? CRI (chronic renal insufficiency), stage 3 (moderate) ([facility]/14/2020  ?  Abscess of face 06/13/2017  ? Constipation 02/22/2017  ? Chest pain, atypical 11/30/2016  ? GERD (gastroesophageal reflux disease) 11/30/2016  ? Menopause 10/23/2016  ? Abscess of finger, left 08/17/2016  ? Thoracic back pain 09/29/2015  ? Osteoarthritis of both knees 09/29/2015  ? Allergic urticaria 03/29/2014  ? Grief 03/22/2014  ? Well adult exam 07/02/2012  ? Obesity 03/06/2012  ? Cervical pain (neck) 12/28/2011  ? Neoplasm of uncertain behavior of skin 08/03/2011  ? Onychomycosis 08/03/2011  ? Vitamin B 12 deficiency 05/07/2011  ? Hemorrhoid 05/03/2011  ? Dyslipidemia 10/31/2010  ? TRIGGER FINGER 03/30/2010  ? WEIGHT LOSS 12/15/2009  ? LOW BACK PAIN 10/25/2009  ? HYPERGLYCEMIA 10/25/2009  ? Vitamin D deficiency 02/24/2009  ? VERTIGO 04/09/2008  ? KNEE PAIN 03/30/2008  ? ALLERGIC RHINITIS 02/13/2008  ? Pain in joint, shoulder region 02/13/2008  ? Anxiety state 09/30/2007  ? Situational depression 09/30/2007  ? EAR PAIN 09/30/2007  ? Essential hypertension 09/30/2007  ? ? ?History obtained from: chart review and patient. ? ?Jane Mooney a [age].o. female presenting for a follow up visit.  She was last seen in September 2022 by ChAlthea Charonone of our nurse practitioners.  At that time, we continued Zyrtec 10 mg twice daily as well as Flonase 1 spray per nostril daily.  Her urticaria was controlled with Zyrtec twice daily as well as hydroxyzine at night. ? ?Since the last visit, she has done well. She is doing a [employer] weight loss program.  She is having some success with that. She has not traveled a lot since COVID. She has one 31yo grandson who lives in [place]. He is not in college and wanted to take a gap year. Currently he is driving for [employer]. He is thinking of doing computer.  ? ?Allergic Rhinitis Symptom History: Allergy symptoms are under fair control. She has been having some rhinorrhea recently. She has been on the Flonase and this seems to help a lot.  ? ?Skin Symptom History: She is on cetirizine  '10mg'$  once daily. Her last hive outbreak was before seeing me in May 2021.  She is still on -12 hydroxyzine tablets at night. She varies it up depending on how she feels.  ? ?They recently found a nodule on her lung in December 2022. She is a never smoker. She was getting a tickle in her throat and a cough at night. This is all managed by Dr. Loanne Drilling. She has another chest CT scheduled for June 2023. This cough is being managed by a prescription cough medicine to use PRN. She has not ever refilled it. She has no history of asthma.  ? ?Blood pressure has been well controlled. She is on Maxzide, telmisartan, and amlodipine. She is genetically predisposed to hypertension. Overall her blood pressures have been well controlled. ? ?Her mother was a Marine scientist who worked for the health department.  ? ?Otherwise, there have been no changes to her past medical history, surgical history, family history, or social history. ? ? ? ?Review of Systems  ?Constitutional: Negative.  Negative for chills, fever, malaise/fatigue and weight loss.  ?HENT: Negative.  Negative for ear discharge, ear pain and sinus pain.   ?Eyes:  Negative for pain, discharge and redness.  ?Respiratory:  Negative for cough, sputum production, shortness of breath and wheezing.   ?Cardiovascular: Negative.  Negative for chest pain and palpitations.  ?Gastrointestinal:  Negative for abdominal pain, constipation, diarrhea, heartburn, nausea and vomiting.  ?Skin: Negative.  Negative for itching and rash.  ?Neurological:  Negative for dizziness and headaches.  ?Endo/Heme/Allergies:  Negative for environmental allergies. Does not bruise/bleed easily.   ? ? ? ?Objective:  ? ?Blood pressure (!) 112/50, pulse 63, temperature 98.2 ?F (36.8 ?C), temperature source Temporal, resp. rate 20, height '5\' 3"'$  (1.6 m), weight 180 lb 3.2 oz (81.7 kg), SpO2 99 %. ?Body mass index is 31.92 kg/m?. ? ? ? ?Physical Exam ?Vitals reviewed.  ?Constitutional:   ?   Appearance: She is  well-developed.  ?   Comments: Pleasant talkative female.  ?HENT:  ?   Head: Normocephalic and atraumatic.  ?   Right Ear: Tympanic membrane, ear canal and external ear normal.  ?   Left Ear: Tympanic membrane, ear canal and external ear normal.  ?   Nose: No nasal deformity, septal deviation, mucosal edema or rhinorrhea.  ?   Right Turbinates: Enlarged and swollen.  ?   Left Turbinates: Enlarged and swollen.  ?   Right Sinus: No maxillary sinus tenderness or frontal sinus tenderness.  ?   Left Sinus: No maxillary sinus tenderness or frontal sinus tenderness.  ?   Mouth/Throat:  ?   Mouth: Mucous membranes are not pale and not dry.  ?   Pharynx: Uvula midline.  ?Eyes:  ?   General:     ?   Right eye: No discharge.     ?   Left eye: No discharge.  ?   Conjunctiva/sclera: Conjunctivae normal.  ?   Right eye:  Right conjunctiva is not injected. No chemosis. ?   Left eye: Left conjunctiva is not injected. No chemosis. ?   Pupils: Pupils are equal, round, and reactive to light.  ?Cardiovascular:  ?   Rate and Rhythm: Normal rate and regular rhythm.  ?   Heart sounds: Normal heart sounds.  ?Pulmonary:  ?   Effort: Pulmonary effort is normal. No tachypnea, accessory muscle usage or respiratory distress.  ?   Breath sounds: Normal breath sounds. No wheezing, rhonchi or rales.  ?Chest:  ?   Chest wall: No tenderness.  ?Lymphadenopathy:  ?   Cervical: No cervical adenopathy.  ?Skin: ?   General: Skin is warm.  ?   Capillary Refill: Capillary refill takes less than 2 seconds.  ?   Coloration: Skin is not pale.  ?   Findings: No abrasion, erythema, petechiae or rash. Rash is not papular, urticarial or vesicular.  ?   Comments: No excoriations noted.  ?Neurological:  ?   Mental Status: She is alert.  ?Psychiatric:     ?   Behavior: Behavior is cooperative.  ?  ? ?Diagnostic studies: none ? ? ?  ?Salvatore Marvel, MD  ?[facility] [place] [facility] ? ? ? ? ? ? ?

## 2022-01-18 NOTE — Patient Instructions (Addendum)
1. Seasonal and perennial allergic rhinitis (grasses, ragweed, weeds, trees, indoor molds, outdoor molds, dust mites and cat) ?- Continue with: Zyrtec (cetirizine) '10mg'$  once daily and Flonase (fluticasone) one spray per nostril daily ?- I think we can avoid allergy shots for now.   ?  ?2. Allergic urticaria ?- Previous lab workup was fairly normal. ? - Morning: Zyrtec (cetirizine) '10mg'$  (one tablet)  ? - Evening: hydroxyzine 1-2 tablets at night ?- You can change this dosing at home, decreasing the dose as needed or increasing the dosing as needed.  ? ?3. Return in about 1 year (around 01/19/2023).  ? ?Please inform us of any Emergency Department visits, hospitalizations, or changes in symptoms. Call us before going to the ED for breathing or allergy symptoms since we might be able to fit you in for a sick visit. Feel free to contact us anytime with any questions, problems, or concerns. ? ?It was a pleasure to see you again today! ? ?Websites that have reliable patient information: ?1. American Academy of Asthma, Allergy, and Immunology: www.aaaai.org ?2. Food Allergy Research and Education (FARE): foodallergy.org ?3. Mothers of Asthmatics: http://www.asthmacommunitynetwork.org ?4. [employer] of Allergy, Asthma, and Immunology: MonthlyElectricBill.co.uk ? ? ?COVID-19 Vaccine Information can be found at: ShippingScam.co.uk For questions related to vaccine distribution or appointments, please email [email or web address] or call [phone].  ? ? ? ??Like? [place] on Facebook and Instagram for our latest updates!  ?  ? ? ? ?Make sure you are registered to vote! If you have moved or changed any of your contact information, you will need to get this updated before voting! ? ?In some cases, you MAY be able to register to vote online: [email or web address] ? ? ? ? ?

## 2022-01-19 ENCOUNTER — Encounter: Payer: Self-pay | Admitting: Allergy & Immunology

## 2022-01-25 ENCOUNTER — Telehealth: Payer: Self-pay

## 2022-01-25 NOTE — Telephone Encounter (Signed)
-----   Message from Clayton, MD sent at 01/22/2022  5:10 PM EDT ----- ?Regarding: Schedule follow-up with me after June 6 ?Schedule follow-up with me after June 6th ? ?

## 2022-01-25 NOTE — Telephone Encounter (Signed)
Please schedule patient for OV after June 6th, 2023, per JE ?

## 2022-01-30 ENCOUNTER — Other Ambulatory Visit: Payer: Self-pay

## 2022-02-02 ENCOUNTER — Other Ambulatory Visit (HOSPITAL_BASED_OUTPATIENT_CLINIC_OR_DEPARTMENT_OTHER): Payer: Self-pay

## 2022-02-02 MED ORDER — ZOSTER VAC RECOMB ADJUVANTED 50 MCG/0.5ML IM SUSR
INTRAMUSCULAR | 0 refills | Status: DC
  Filled 2022-02-02: qty 0.5, 1d supply, fill #0

## 2022-02-06 DIAGNOSIS — R918 Other nonspecific abnormal finding of lung field: Secondary | ICD-10-CM | POA: Diagnosis not present

## 2022-02-06 DIAGNOSIS — N1831 Chronic kidney disease, stage 3a: Secondary | ICD-10-CM | POA: Diagnosis not present

## 2022-02-06 DIAGNOSIS — I129 Hypertensive chronic kidney disease with stage 1 through stage 4 chronic kidney disease, or unspecified chronic kidney disease: Secondary | ICD-10-CM | POA: Diagnosis not present

## 2022-02-06 DIAGNOSIS — K219 Gastro-esophageal reflux disease without esophagitis: Secondary | ICD-10-CM | POA: Diagnosis not present

## 2022-02-06 DIAGNOSIS — R011 Cardiac murmur, unspecified: Secondary | ICD-10-CM | POA: Diagnosis not present

## 2022-02-06 DIAGNOSIS — L500 Allergic urticaria: Secondary | ICD-10-CM | POA: Diagnosis not present

## 2022-02-09 HISTORY — PX: CATARACT EXTRACTION: SUR2

## 2022-02-22 ENCOUNTER — Ambulatory Visit: Payer: Medicare HMO | Admitting: Sports Medicine

## 2022-02-26 ENCOUNTER — Ambulatory Visit: Payer: Medicare HMO | Admitting: Podiatry

## 2022-02-26 DIAGNOSIS — B351 Tinea unguium: Secondary | ICD-10-CM | POA: Diagnosis not present

## 2022-02-26 NOTE — Progress Notes (Signed)
? ?  Subjective: ?Patient presents today for follow-up treatment and evaluation of fungal nails.  Patient has been on the oral fluconazole for her toenail fungus that was prescribed back in February 2023.  She has also been applying the OTC Tolcylen antifungal which was dispensed here in our office.  She was last seen in the office 01/04/2022 by Dr. Cannon Kettle.  She presents for further treatment and evaluation ? ?Objective: ?Physical Exam ?General: The patient is alert and oriented x3 in no acute distress. ? ?Dermatology: Hyperkeratotic, discolored, thickened, onychodystrophy of nails noted.  ?Skin is warm, dry and supple bilateral lower extremities. Negative for open lesions or macerations. ? ?Vascular: Palpable pedal pulses bilaterally. No edema or erythema noted. Capillary refill within normal limits. ? ?Neurological: Epicritic and protective threshold grossly intact bilaterally.  ? ?Musculoskeletal Exam: No pedal deformity ? ?Assessment: ?#1  Onychomycosis of toenails bilateral ? ?Plan of Care:  ?#1 Patient was evaluated. ?#2  Continue fluconazole as prescribed until completed.  Patient states that she only has about 2-3 weeks left ?#3 continue OTC Tolcylen antifungal topical daily ?#4 return to clinic 6 months, at this time if the patient would like to pursue another round of antifungal we will order updated LFTs and reinitiate fluconazole ? ?Edrick Kins, DPM ?[facility] ? ?Dr. Edrick Kins, DPM  ?  ?[address]                                       ?[place], [place] [address]                ?Office [phone]  ?Fax [phone] ? ? ? ? ? ? ?

## 2022-03-13 ENCOUNTER — Ambulatory Visit: Payer: Medicare HMO | Admitting: Internal Medicine

## 2022-03-15 ENCOUNTER — Ambulatory Visit (INDEPENDENT_AMBULATORY_CARE_PROVIDER_SITE_OTHER): Payer: Medicare HMO | Admitting: Internal Medicine

## 2022-03-15 ENCOUNTER — Encounter: Payer: Self-pay | Admitting: Internal Medicine

## 2022-03-15 VITALS — BP 122/78 | HR 88 | Temp 98.2°F | Ht 63.0 in | Wt 176.4 lb

## 2022-03-15 DIAGNOSIS — N183 Chronic kidney disease, stage 3 unspecified: Secondary | ICD-10-CM | POA: Diagnosis not present

## 2022-03-15 DIAGNOSIS — F411 Generalized anxiety disorder: Secondary | ICD-10-CM

## 2022-03-15 DIAGNOSIS — R609 Edema, unspecified: Secondary | ICD-10-CM | POA: Diagnosis not present

## 2022-03-15 DIAGNOSIS — M25561 Pain in right knee: Secondary | ICD-10-CM

## 2022-03-15 DIAGNOSIS — I700 Atherosclerosis of aorta: Secondary | ICD-10-CM

## 2022-03-15 DIAGNOSIS — I2583 Coronary atherosclerosis due to lipid rich plaque: Secondary | ICD-10-CM

## 2022-03-15 DIAGNOSIS — M7050 Other bursitis of knee, unspecified knee: Secondary | ICD-10-CM

## 2022-03-15 DIAGNOSIS — I2510 Atherosclerotic heart disease of native coronary artery without angina pectoris: Secondary | ICD-10-CM | POA: Diagnosis not present

## 2022-03-15 DIAGNOSIS — R739 Hyperglycemia, unspecified: Secondary | ICD-10-CM

## 2022-03-15 DIAGNOSIS — E785 Hyperlipidemia, unspecified: Secondary | ICD-10-CM

## 2022-03-15 DIAGNOSIS — M170 Bilateral primary osteoarthritis of knee: Secondary | ICD-10-CM

## 2022-03-15 MED ORDER — AMLODIPINE BESYLATE 2.5 MG PO TABS: 2.5000 mg | ORAL_TABLET | Freq: Every day | ORAL | 3 refills | Status: DC

## 2022-03-15 MED ORDER — ROSUVASTATIN CALCIUM 5 MG PO TABS: 5.0000 mg | ORAL_TABLET | Freq: Every day | ORAL | 11 refills | Status: DC

## 2022-03-15 NOTE — Assessment & Plan Note (Signed)
R: see procedure ?

## 2022-03-15 NOTE — Assessment & Plan Note (Signed)
Chronic Lorazepam prn  Potential benefits of a long term benzodiazepines  use as well as potential risks  and complications were explained to the patient and were aknowledged. 

## 2022-03-15 NOTE — Assessment & Plan Note (Signed)
Avoid NSAIDs 

## 2022-03-15 NOTE — Assessment & Plan Note (Signed)
R knee b.anserina bursitis Options discussed - will inject. Use ice Blue-Emu cream was recommended to use 2-3 times a day 

## 2022-03-15 NOTE — Assessment & Plan Note (Signed)
LE swelling - resolved after she started to take Amlodipine 1/2 tab ?

## 2022-03-15 NOTE — Assessment & Plan Note (Signed)
Start Crestor ?

## 2022-03-15 NOTE — Progress Notes (Signed)
Subjective:  Patient ID: Jane Mooney, female    DOB: 1951-01-29  Age: 71 y.o. MRN: [id number]  CC: 3 month f/u (No concerns. )   HPI Jane Mooney presents for HTN, OA, anxiety C/o LE swelling - resolved after she started to take Amlodipine 1/2 tab.  C/o R knee pain - worse  Outpatient Medications Prior to Visit  Medication Sig Dispense Refill   acetaminophen (TYLENOL) 500 MG tablet Take 1,000 mg by mouth every 6 (six) hours as needed for moderate pain or headache.     aspirin EC 81 MG tablet Take 1 tablet (81 mg total) by mouth daily. 100 tablet 3   cetirizine (ZYRTEC) 10 MG tablet Take 1 tablet (10 mg total) by mouth daily. 90 tablet 3   cholecalciferol (VITAMIN D) 1000 units tablet Take 2 tablets (2,000 Units total) by mouth daily. (Patient taking differently: Take 1,000 Units by mouth daily.) 100 tablet 11   Cyanocobalamin (VITAMIN B-12) 1000 MCG SUBL Place 1 tablet (1,000 mcg total) under the tongue daily. 100 tablet 3   famotidine (PEPCID) 20 MG tablet Take 1 tablet (20 mg total) by mouth daily. 90 tablet 3   fish oil-omega-3 fatty acids 1000 MG capsule Take 1 g by mouth daily.     fluconazole (DIFLUCAN) 200 MG tablet Take 1 tablet (200 mg total) by mouth daily. 90 tablet 0   fluticasone (FLONASE) 50 MCG/ACT nasal spray Place 1 spray into both nostrils daily as needed for allergies. 16 g 5   hydrOXYzine (ATARAX) 25 MG tablet TAKE 1-2 TABLETS AT NIGHT AS NEEDED FOR ITCHING. CAUTION AS THIS MEDICATION CAN MAKE YOU DROWSY 180 tablet 2   LORazepam (ATIVAN) 1 MG tablet TAKE 1 TABLET BY MOUTH TWICE A DAY AS NEEDED FOR ANXIETY 60 tablet 3   NONFORMULARY OR COMPOUNDED ITEM [facility]  Antifungal nail #1 Apply to the affected nail (s) once (at bedtime) or twice daily Refills PRN 1 each 0   telmisartan (MICARDIS) 80 MG tablet TAKE 1 TABLET BY MOUTH EVERY DAY 90 tablet 3   triamcinolone ointment (KENALOG) 0.1 % Apply 1 application topically 4 (four) times daily. (Patient taking  differently: Apply 1 application. topically 4 (four) times daily as needed (hemorrhoids).) 80 g 1   venlafaxine XR (EFFEXOR XR) 37.5 MG 24 hr capsule Take 1 capsule (37.5 mg total) by mouth daily with breakfast. 30 capsule 0   amLODipine (NORVASC) 5 MG tablet TAKE 1 TABLET BY MOUTH EVERY DAY 90 tablet 3   Zoster Vaccine Adjuvanted [facility]) injection Inject into the muscle. 0.5 mL 0   triamterene-hydrochlorothiazide (MAXZIDE-25) 37.5-25 MG tablet TAKE 1/2 TABLET BY MOUTH EVERY DAY (Patient not taking: Reported on 03/15/2022) 90 tablet 3   No facility-administered medications prior to visit.    ROS: Review of Systems  Constitutional:  Negative for activity change, appetite change, chills, fatigue and unexpected weight change.  HENT:  Negative for congestion, mouth sores and sinus pressure.   Eyes:  Negative for visual disturbance.  Respiratory:  Negative for cough and chest tightness.   Gastrointestinal:  Negative for abdominal pain and nausea.  Genitourinary:  Negative for difficulty urinating, frequency and vaginal pain.  Musculoskeletal:  Positive for arthralgias, back pain and gait problem.  Skin:  Negative for pallor and rash.  Neurological:  Negative for dizziness, tremors, weakness, numbness and headaches.  Psychiatric/Behavioral:  Negative for confusion and sleep disturbance.    Objective:  BP 122/78   Pulse 88   Temp 98.2  F (36.8 C) (Oral)   Ht '5\' 3"'$  (1.6 m)   Wt 176 lb 6.4 oz (80 kg)   BMI 31.25 kg/m   BP Readings from Last 3 Encounters:  03/15/22 122/78  01/18/22 (!) 112/50  12/12/21 112/60    Wt Readings from Last 3 Encounters:  03/15/22 176 lb 6.4 oz (80 kg)  01/18/22 180 lb 3.2 oz (81.7 kg)  12/12/21 178 lb 6.4 oz (80.9 kg)    Physical Exam Constitutional:      General: She is not in acute distress.    Appearance: She is well-developed.  HENT:     Head: Normocephalic.     Right Ear: External ear normal.     Left Ear: External ear normal.     Nose: Nose  normal.  Eyes:     General:        Right eye: No discharge.        Left eye: No discharge.     Conjunctiva/sclera: Conjunctivae normal.     Pupils: Pupils are equal, round, and reactive to light.  Neck:     Thyroid: No thyromegaly.     Vascular: No JVD.     Trachea: No tracheal deviation.  Cardiovascular:     Rate and Rhythm: Normal rate and regular rhythm.     Heart sounds: Normal heart sounds.  Pulmonary:     Effort: No respiratory distress.     Breath sounds: No stridor. No wheezing.  Abdominal:     General: Bowel sounds are normal. There is no distension.     Palpations: Abdomen is soft. There is no mass.     Tenderness: There is no abdominal tenderness. There is no guarding or rebound.  Musculoskeletal:        General: No tenderness.     Cervical back: Normal range of motion and neck supple. No rigidity.  Lymphadenopathy:     Cervical: No cervical adenopathy.  Skin:    Findings: No erythema or rash.  Neurological:     Cranial Nerves: No cranial nerve deficit.     Motor: No abnormal muscle tone.     Coordination: Coordination normal.     Deep Tendon Reflexes: Reflexes normal.  Psychiatric:        Behavior: Behavior normal.        Thought Content: Thought content normal.        Judgment: Judgment normal.  Right knee with pain  Procedure Note :     Procedure : Joint Injection, right bursa anserina    Indication:  Joint osteoarthritis with refractory  chronic bursitis pain.   Risks including unsuccessful procedure , bleeding, infection, bruising, skin atrophy, "steroid flare-up" and others were explained to the patient in detail as well as the benefits. Informed consent was obtained verbally.  Tthe patient was placed in a comfortable position. R bursa anserina was injected with 3 mL of 2% lidocaine and 40 mg of Depo-Medrol .  Band-Aid was applied.   Tolerated well. Complications: None. Good pain relief following the procedure.   Postprocedure instructions :    A  Band-Aid should be left on for 12 hours. Injection therapy is not a cure itself. It is used in conjunction with other modalities. You can use nonsteroidal anti-inflammatories like ibuprofen , hot and cold compresses. Rest is recommended in the next 24 hours. You need to report immediately  if fever, chills or any signs of infection develop.  Lab Results  Component Value Date   WBC 5.9 12/12/2021  HGB 12.4 12/12/2021   HCT 38.3 12/12/2021   PLT 226.0 12/12/2021   GLUCOSE 110 (H) 12/12/2021   CHOL 249 (H) 08/30/2021   TRIG 113.0 08/30/2021   HDL 47.80 08/30/2021   LDLDIRECT 153.6 07/06/2013   LDLCALC 179 (H) 08/30/2021   ALT 9 12/12/2021   AST 17 12/12/2021   NA 136 12/12/2021   K 4.3 12/12/2021   CL 100 12/12/2021   CREATININE 1.22 (H) 12/12/2021   BUN 23 12/12/2021   CO2 31 12/12/2021   TSH 2.07 08/30/2021   INR 1.0 06/23/2020   HGBA1C 5.8 09/20/2020    CT Chest High Resolution  Result Date: 10/11/2021 CLINICAL DATA:  71 year old female with history of dry cough at night for several months. Evaluate for interstitial lung disease. EXAM: CT CHEST WITHOUT CONTRAST TECHNIQUE: Multidetector CT imaging of the chest was performed following the standard protocol without intravenous contrast. High resolution imaging of the lungs, as well as inspiratory and expiratory imaging, was performed. COMPARISON:  No priors. FINDINGS: Cardiovascular: Heart size is normal. There is no significant pericardial fluid, thickening or pericardial calcification. There is aortic atherosclerosis, as well as atherosclerosis of the great vessels of the mediastinum and the coronary arteries, including calcified atherosclerotic plaque in the left main, left anterior descending and right coronary arteries. Mediastinum/Nodes: No pathologically enlarged mediastinal or hilar lymph nodes. Esophagus is unremarkable in appearance. No axillary lymphadenopathy. Lungs/Pleura: High-resolution images demonstrate no significant  regions of ground-glass attenuation, septal thickening, subpleural reticulation, parenchymal banding, traction bronchiectasis or frank honeycombing to indicate interstitial lung disease. Inspiratory and expiratory imaging demonstrates some mild air trapping indicative of mild small airways disease. No acute consolidative airspace disease. No pleural effusions. In the right middle lobe (axial image 82 of series 3) there is a 8 x 7 mm pulmonary nodule. Upper Abdomen: Unremarkable. Musculoskeletal: There are no aggressive appearing lytic or blastic lesions noted in the visualized portions of the skeleton. IMPRESSION: 1. No findings to suggest interstitial lung disease. 2. 8 x 7 mm right middle lobe pulmonary nodule. Non-contrast chest CT at 6-12 months is recommended. If the nodule is stable at time of repeat CT, then future CT at 18-24 months (from today's scan) is considered optional for low-risk patients, but is recommended for high-risk patients. This recommendation follows the consensus statement: Guidelines for Management of Incidental Pulmonary Nodules Detected on CT Images: From the Fleischner Society 2017; Radiology 2017; 284:228-243. 3. Aortic atherosclerosis, in addition to left main and 2 vessel coronary artery disease. Please note that although the presence of coronary artery calcium documents the presence of coronary artery disease, the severity of this disease and any potential stenosis cannot be assessed on this non-gated CT examination. Assessment for potential risk factor modification, dietary therapy or pharmacologic therapy may be warranted, if clinically indicated. These results will be called to the ordering clinician or representative by the Radiologist Assistant, and communication documented in the PACS or [employer]. Aortic Atherosclerosis (ICD10-I70.0). Electronically Signed   By: Vinnie Langton M.D.   On: 10/11/2021 20:18    Assessment & Plan:   Problem List Items Addressed This  Visit     Dyslipidemia - Primary   Osteoarthritis of both knees   KNEE PAIN    R knee b.anserina bursitis  Options discussed - will inject. Use ice Blue-Emu cream was recommended to use 2-3 times a day          Edema    LE swelling - resolved after she started to take Amlodipine  1/2 tab      CRI (chronic renal insufficiency), stage 3 (moderate) (HCC)    Avoid NSAIDs       Coronary atherosclerosis    Start Crestor       Relevant Medications   amLODipine (NORVASC) 2.5 MG tablet   Other Relevant Orders   Comprehensive metabolic panel   Hemoglobin A1c   Lipid panel   Aortic atherosclerosis (HCC)    Start Crestor       Relevant Medications   amLODipine (NORVASC) 2.5 MG tablet   Anxiety state    Chronic Lorazepam prn  Potential benefits of a long term benzodiazepines  use as well as potential risks  and complications were explained to the patient and were aknowledged.      Anserine bursitis    R: see procedure       Other Visit Diagnoses     Hyperglycemia       Relevant Orders   Hemoglobin A1c         Meds ordered this encounter  Medications   amLODipine (NORVASC) 2.5 MG tablet    Sig: Take 1 tablet (2.5 mg total) by mouth daily.    Dispense:  90 tablet    Refill:  3   DISCONTD: rosuvastatin (CRESTOR) 5 MG tablet    Sig: Take 1 tablet (5 mg total) by mouth daily.    Dispense:  30 tablet    Refill:  11      Follow-up: Return in about 3 months (around 06/15/2022) for a follow-up visit.  [name], MD

## 2022-03-15 NOTE — Patient Instructions (Addendum)
Blue-Emu cream --use 2-3 times a day ? ? ? Postprocedure instructions :  ? ? A Band-Aid should be left on for 12 hours. Injection therapy is not a cure itself. It is used in conjunction with other modalities. You can use nonsteroidal anti-inflammatories like ibuprofen , hot and cold compresses. Rest is recommended in the next 24 hours. You need to report immediately  if fever, chills or any signs of infection develop. ?

## 2022-03-23 ENCOUNTER — Other Ambulatory Visit: Payer: Self-pay | Admitting: Internal Medicine

## 2022-03-26 IMAGING — DX DG HIP (WITH OR WITHOUT PELVIS) 2-3V*R*
3 series · 3 of 3 positions shown · non-contrast
Comparison: None.

CLINICAL DATA: Pain

EXAM:
DG HIP (WITH OR WITHOUT PELVIS) 2-3V RIGHT

[pelvis ap]
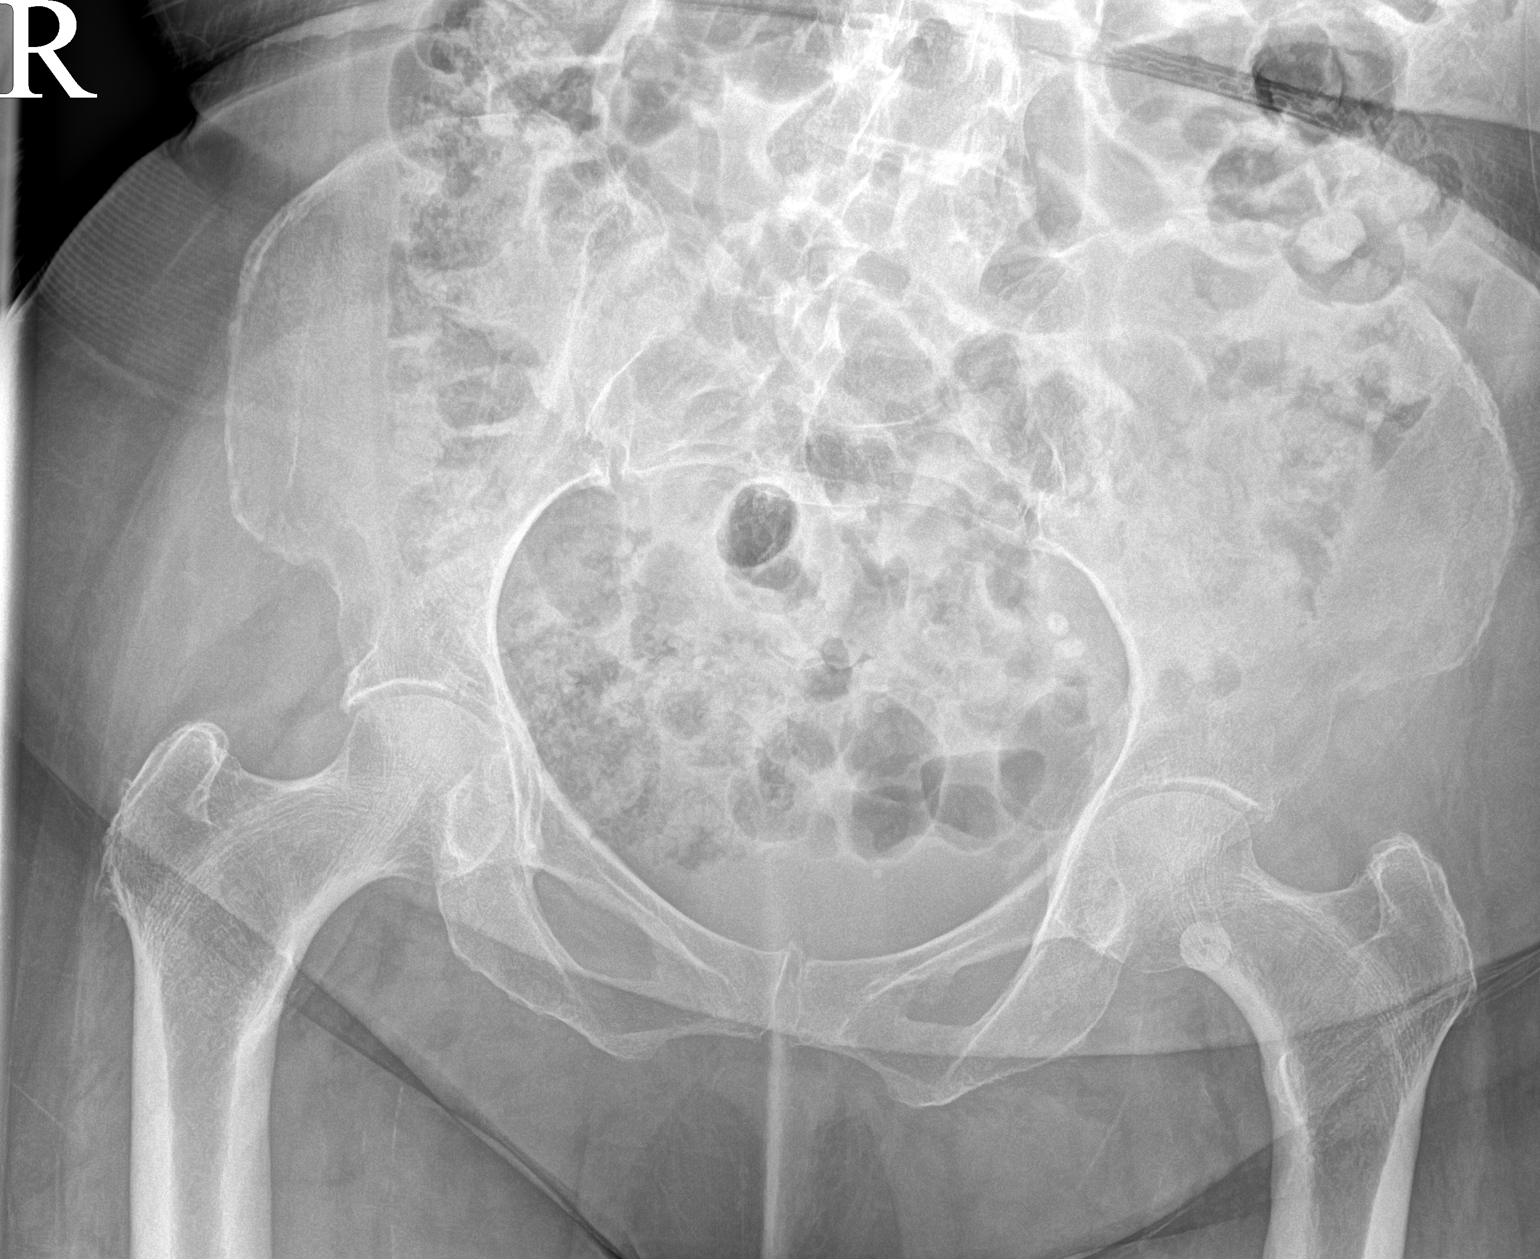

[hip ap]
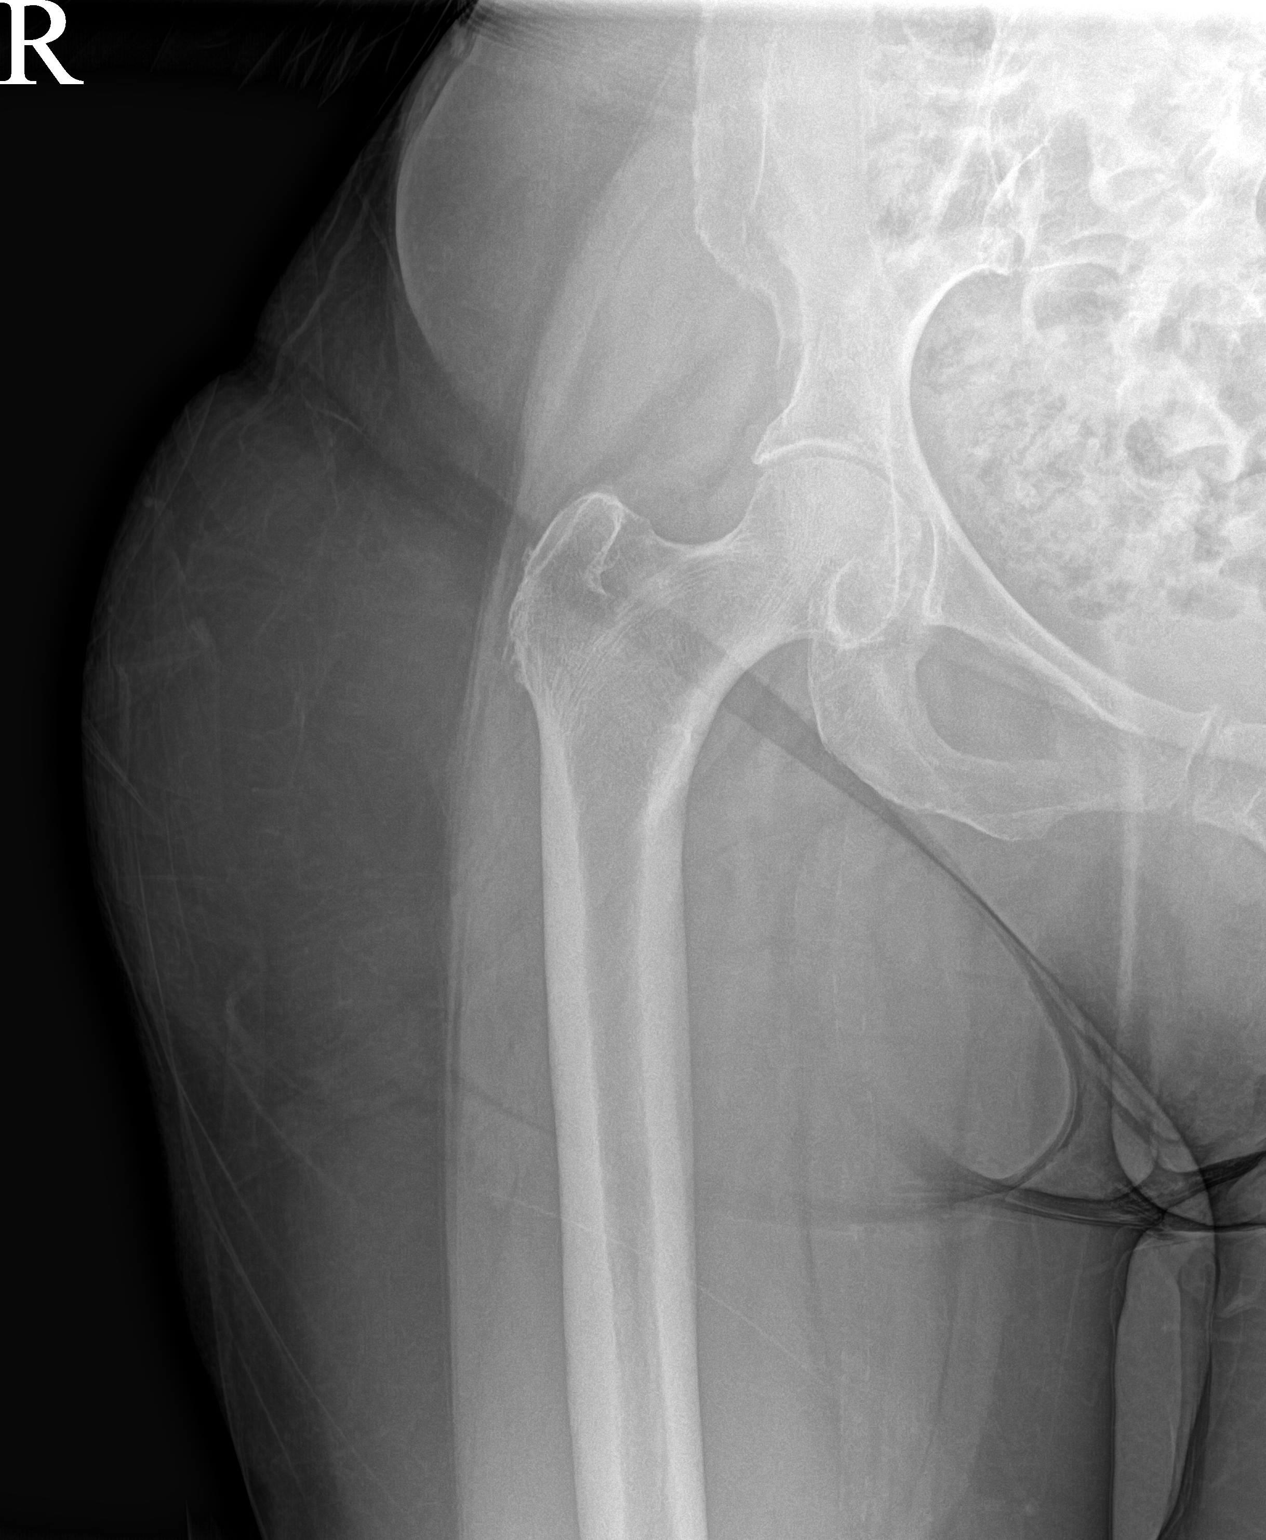

[hip frog leg]
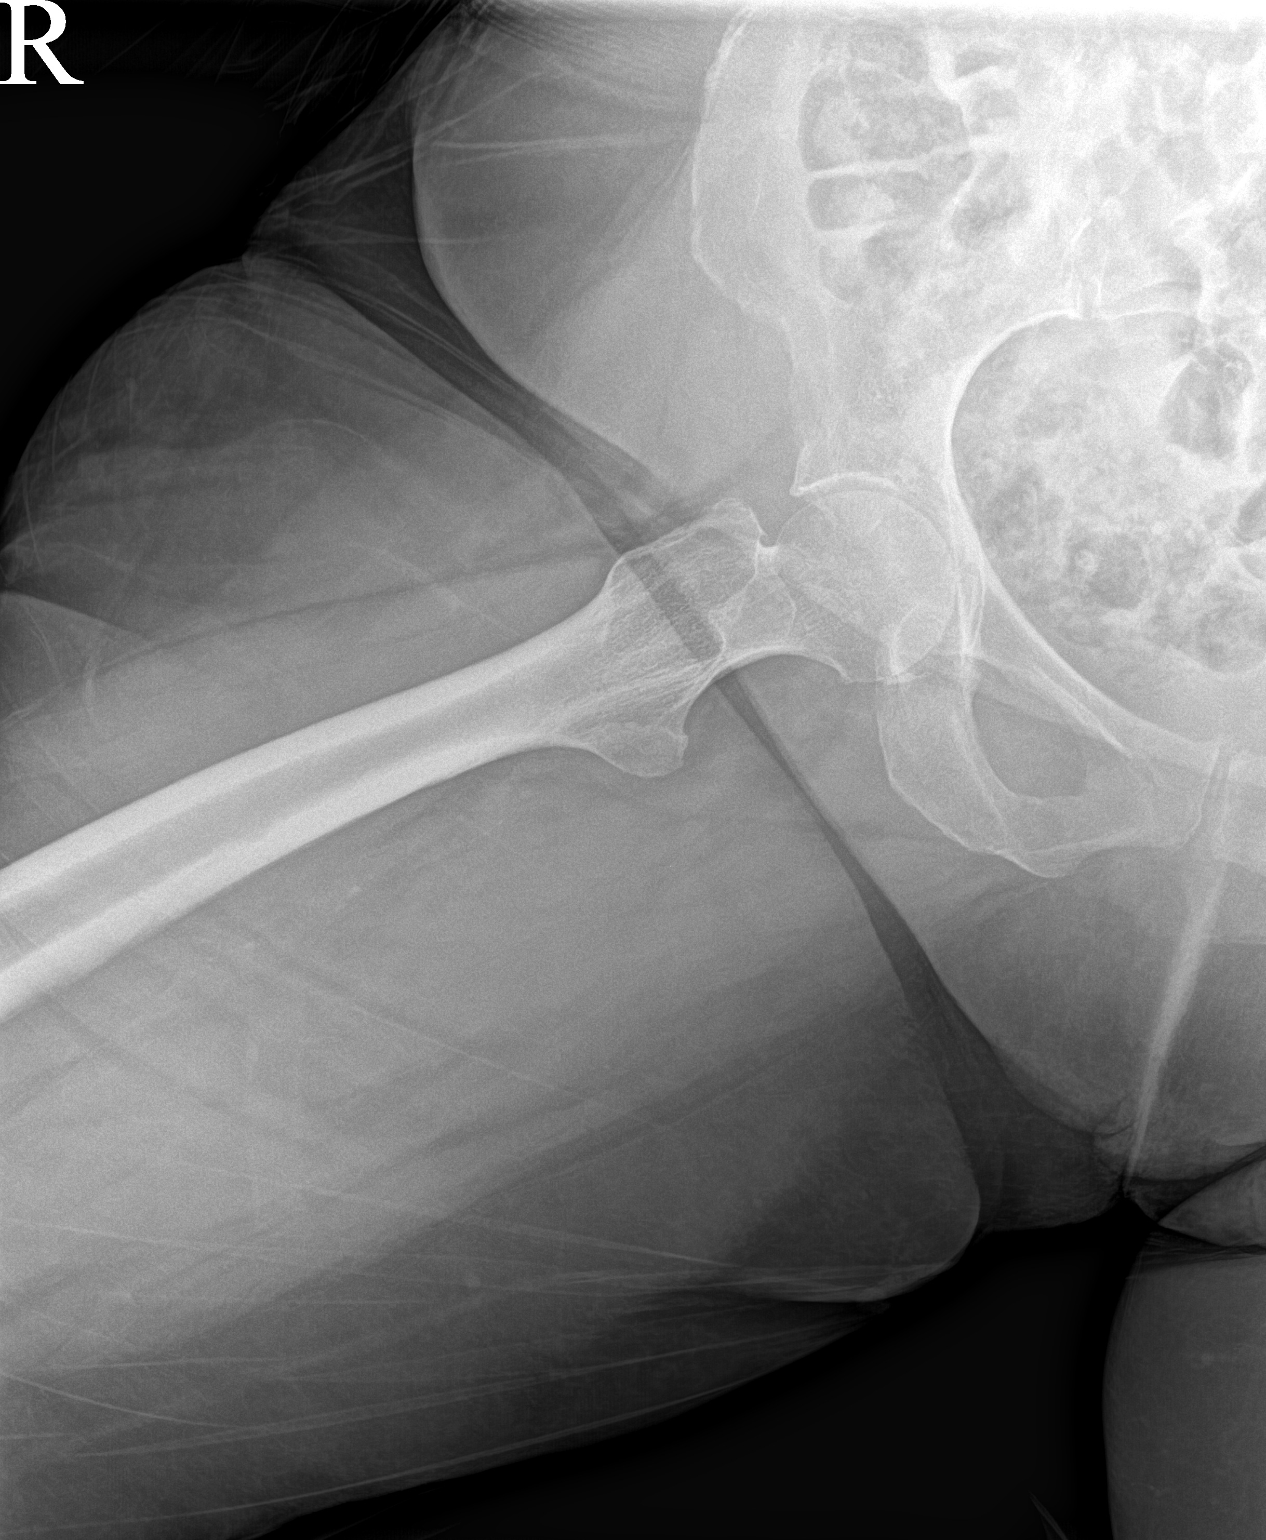

[3 of 3 positions shown; findings below may reference images not displayed]

FINDINGS: Frontal pelvis as well as frontal and lateral right hip images were
obtained. No fracture or dislocation. There is mild symmetric
narrowing of each hip joint. No erosive change. Sacroiliac joints
appear normal bilaterally.
IMPRESSION: Mild symmetric narrowing of each hip joint. No fracture or
dislocation.

## 2022-03-27 NOTE — Telephone Encounter (Signed)
Rec'd msg stating " Product Backordered/Unavailable:RX ON BACKORDER". Pls change to alternative.../l ?

## 2022-04-14 ENCOUNTER — Other Ambulatory Visit: Payer: Self-pay | Admitting: Internal Medicine

## 2022-04-20 ENCOUNTER — Other Ambulatory Visit (INDEPENDENT_AMBULATORY_CARE_PROVIDER_SITE_OTHER): Payer: Medicare HMO

## 2022-04-20 ENCOUNTER — Ambulatory Visit (INDEPENDENT_AMBULATORY_CARE_PROVIDER_SITE_OTHER)
Admission: RE | Admit: 2022-04-20 | Discharge: 2022-04-20 | Disposition: A | Discharge status: Home / Self Care | Payer: Medicare HMO | Source: Ambulatory Visit | Attending: Pulmonary Disease | Admitting: Pulmonary Disease

## 2022-04-20 DIAGNOSIS — M7050 Other bursitis of knee, unspecified knee: Secondary | ICD-10-CM

## 2022-04-20 DIAGNOSIS — I2583 Coronary atherosclerosis due to lipid rich plaque: Secondary | ICD-10-CM | POA: Diagnosis not present

## 2022-04-20 DIAGNOSIS — R911 Solitary pulmonary nodule: Secondary | ICD-10-CM | POA: Diagnosis not present

## 2022-04-20 DIAGNOSIS — E785 Hyperlipidemia, unspecified: Secondary | ICD-10-CM | POA: Diagnosis not present

## 2022-04-20 DIAGNOSIS — I2510 Atherosclerotic heart disease of native coronary artery without angina pectoris: Secondary | ICD-10-CM | POA: Diagnosis not present

## 2022-04-20 DIAGNOSIS — R739 Hyperglycemia, unspecified: Secondary | ICD-10-CM

## 2022-04-20 LAB — LIPID PANEL
Cholesterol: 131 mg/dL (ref 0–200)
HDL: 51.7 mg/dL (ref >39.00)
LDL Cholesterol: 63 mg/dL (ref 0–99)
NonHDL: 79.67
Total CHOL/HDL Ratio: 3
Triglycerides: 82 mg/dL (ref 0.0–149.0)
VLDL: 16.4 mg/dL (ref 0.0–40.0)

## 2022-04-20 LAB — COMPREHENSIVE METABOLIC PANEL
ALT: 15 U/L (ref 0–35)
AST: 20 U/L (ref 0–37)
Albumin: 4.2 g/dL (ref 3.5–5.2)
Alkaline Phosphatase: 78 U/L (ref 39–117)
BUN: 18 mg/dL (ref 6–23)
CO2: 30 mEq/L (ref 19–32)
Calcium: 9.6 mg/dL (ref 8.4–10.5)
Chloride: 101 mEq/L (ref 96–112)
Creatinine, Ser: 1.16 mg/dL (ref 0.40–1.20)
GFR: 47.01 mL/min — ABNORMAL LOW (ref >60.00)
Glucose, Bld: 94 mg/dL (ref 70–99)
Potassium: 4.3 mEq/L (ref 3.5–5.1)
Sodium: 138 mEq/L (ref 135–145)
Total Bilirubin: 0.4 mg/dL (ref 0.2–1.2)
Total Protein: 7.3 g/dL (ref 6.0–8.3)

## 2022-04-20 LAB — HEMOGLOBIN A1C: Hgb A1c MFr Bld: 5.7 % (ref 4.6–6.5)

## 2022-04-20 MED ORDER — METHYLPREDNISOLONE ACETATE 40 MG/ML IJ SUSP
40.0000 mg | Freq: Once | INTRAMUSCULAR | Status: AC
  Administered 2022-04-20: 40 mg via INTRAMUSCULAR

## 2022-04-20 NOTE — Addendum Note (Signed)
Addended by: Marijean Heath R on: 04/20/2022 11:18 AM   Modules accepted: Orders

## 2022-04-23 ENCOUNTER — Encounter: Payer: Self-pay | Admitting: Pulmonary Disease

## 2022-04-23 ENCOUNTER — Ambulatory Visit: Payer: Medicare HMO | Admitting: Pulmonary Disease

## 2022-04-23 VITALS — BP 120/72 | HR 62 | Temp 98.0°F | Ht 63.0 in | Wt 177.6 lb

## 2022-04-23 DIAGNOSIS — J984 Other disorders of lung: Secondary | ICD-10-CM

## 2022-04-23 DIAGNOSIS — R911 Solitary pulmonary nodule: Secondary | ICD-10-CM

## 2022-04-23 MED ORDER — HYDROCODONE BIT-HOMATROP MBR 5-1.5 MG/5ML PO SOLN: 5.0000 mL | Freq: Four times a day (QID) | ORAL | 0 refills | Status: DC | PRN

## 2022-04-23 NOTE — Progress Notes (Signed)
Subjective:   PATIENT ID: Jane Mooney GENDER: female DOB: 11-Nov-1951, MRN: [id number]   HPI  Chief Complaint  Patient presents with   Follow-up    Cough at times, CT review    Reason for Visit: Follow-up lung nodule, chronic cough  [name] is a 71 year old female with allergic rhinitis, history of recurrent urticaria, hypertension and CKD who presents for follow-up.  Synopsis 09/26/21 For the last six months she gradually developed chronic cough that is unproductive. Denies preceding illness. Denies wheezing or shortness of breath. Occurs mostly at night which wakes her up with episodes that last for few minutes until she takes a ricola. No daytime cough. Drinks hot tea or water at bedtime. The cough is severe enough to cause her a sore throat later. She has been following Dr. Ernst Bowler in Allergy. Compliant with her current regimen with zyrtec. Still has some post-nasal drainage. No recent urticaria >1 year. Her PCP started on her pepcid 3-4 months ago.   10/17/21 She presents to review CT scan. She is anxious and was not able to sleep last night. She does report improvement in her cough and has not coughed in the last two days.  04/23/22 Since our last visit her nonproductive cough is present but not severe. Occasionally still takes cough syrup and compliant with her H2 blocker and antihistamines per Allergy. Had CT chest completed last week and awaiting discussion on results. Denies shortness of breath, wheezing, hemoptysis. No changes in appetite, weight. No night sweats.   Social History: Never smoker No pets Retired Health and safety inspector exposures: None. Denies dust or industrial exposures.  Past Medical History:  Diagnosis Date   Allergic rhinitis    Anemia    Anxiety 2010   Chronic kidney disease    renal insufficiency    Depression    DJD (degenerative joint disease)    Left knee   Heart murmur    as a child    Hemorrhoid    HTN (hypertension)     Hyperlipidemia    LBP (low back pain)    MVP (mitral valve prolapse)    PONV (postoperative nausea and vomiting)    Symptomatic PVCs    Urticaria    recurrent     Family History  Problem Relation Age of Onset   Hypertension Mother    Diabetes Other    Hypertension Other    Breast cancer Other    Cancer Other        breast    Colon cancer Neg Hx    Esophageal cancer Neg Hx    Stomach cancer Neg Hx    Rectal cancer Neg Hx      Social History   Occupational History   Occupation: Retired Programmer, 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  amLODipine (NORVASC) 2.5 MG tablet Take 1 tablet (2.5 mg total) by mouth daily. 90 tablet 3   aspirin EC 81 MG tablet Take 1 tablet (81 mg total) by mouth daily. 100 tablet 3   cholecalciferol (VITAMIN D) 1000 units tablet Take 2 tablets (2,000 Units total) by mouth daily. (Patient taking differently: Take 1,000 Units by mouth daily.) 100 tablet 11   Cyanocobalamin (VITAMIN B-12) 1000 MCG SUBL Place 1 tablet (1,000 mcg total) under the tongue daily. 100 tablet 3   famotidine (PEPCID) 20 MG tablet Take 1 tablet (20 mg total) by mouth daily. 90 tablet 3   fish oil-omega-3 fatty acids 1000 MG capsule Take 1 g by mouth daily.     fluconazole (DIFLUCAN) 200 MG tablet Take 1 tablet (200 mg total) by mouth daily. 90 tablet 0   fluticasone (FLONASE) 50 MCG/ACT nasal spray Place 1 spray into both nostrils daily as needed for  allergies. 16 g 5   hydrOXYzine (ATARAX) 25 MG tablet TAKE 1-2 TABLETS AT NIGHT AS NEEDED FOR ITCHING. CAUTION AS THIS MEDICATION CAN MAKE YOU DROWSY 180 tablet 2   LORazepam (ATIVAN) 1 MG tablet TAKE 1 TABLET BY MOUTH TWICE A DAY AS NEEDED FOR ANXIETY 60 tablet 3   NONFORMULARY OR COMPOUNDED ITEM [facility]  Antifungal nail #1 Apply to the affected nail (s) once (at bedtime) or twice daily Refills PRN 1 each 0   rosuvastatin (CRESTOR) 5 MG tablet TAKE 1 TABLET (5 MG TOTAL) BY MOUTH DAILY. 30 tablet 11   telmisartan (MICARDIS) 80 MG tablet TAKE 1 TABLET BY MOUTH EVERY DAY 90 tablet 3   triamterene-hydrochlorothiazide (MAXZIDE-25) 37.5-25 MG tablet TAKE 1/2 TABLET BY MOUTH EVERY DAY 90 tablet 3   venlafaxine XR (EFFEXOR-XR) 37.5 MG 24 hr capsule TAKE 1 CAPSULE BY MOUTH DAILY WITH BREAKFAST. 90 capsule 3   cetirizine (ZYRTEC) 10 MG tablet Take 1 tablet (10 mg total) by mouth daily. 90 tablet 3   triamcinolone ointment (KENALOG) 0.1 % Apply 1 application topically 4 (four) times daily. (Patient not taking: Reported on 04/23/2022) 80 g 1   No facility-administered medications prior to visit.    Review of Systems  Constitutional:  Negative for chills, diaphoresis, fever, malaise/fatigue and weight loss.  HENT:  Negative for congestion.   Respiratory:  Positive for cough. Negative for hemoptysis, sputum production, shortness of breath and wheezing.   Cardiovascular:  Negative for chest pain, palpitations and leg swelling.     Objective:   Vitals:   04/23/22 0855  BP: 120/72  Pulse: 62  Temp: 98 F (36.7 C)  TempSrc: Oral  SpO2: 99%  Weight: 177 lb 9.6 oz (80.6 kg)  Height: '5\' 3"'$  (1.6 m)   SpO2: 99 % O2 Device: None (Room air)  Physical Exam: General: Well-appearing, no acute distress HENT: [place], AT Eyes: EOMI, no scleral icterus Respiratory: Clear to auscultation bilaterally.  No crackles, wheezing or rales Cardiovascular: RRR, -M/R/G, no  JVD Extremities:-Edema,-tenderness Neuro: AAO x4, CNII-XII grossly intact Psych: Normal mood, normal affect  Data Reviewed:  Imaging: CXR 05/02/2021-normal chest x-ray.  No infiltrate, effusion or edema.  No overt parenchymal abnormalities CT Chest HR 10/11/21 - No evidence of interstitial lung disease. RML nodule measuring 8 x 10m. No prior CT to compare. CT Chest 04/20/22 - Unchanged 8 mm RML nodule  PFT: 09/26/2021 FVC 2.49 (113%) FEV1 2.19 (129%) Ratio 76 TLC 74% DLCO 74% Interpretation: No obstructive defect.  Mild restrictive defect with mildly reduced gas exchange.  No  significant bronchodilator response.  Normal F-V loops.  Labs: CBC    Component Value Date/Time   WBC 5.9 12/12/2021 0928   RBC 4.83 12/12/2021 0928   HGB 12.4 12/12/2021 0928   HCT 38.3 12/12/2021 0928   PLT 226.0 12/12/2021 0928   MCV 79.3 12/12/2021 0928   MCH 26.3 06/23/2020 0922   MCHC 32.3 12/12/2021 0928   RDW 14.4 12/12/2021 0928   LYMPHSABS 1.7 12/12/2021 0928   MONOABS 0.7 12/12/2021 0928   EOSABS 0.1 12/12/2021 0928   BASOSABS 0.0 12/12/2021 0928   Absolute eos  08/30/2021-200 12/12/21 - 100    Assessment & Plan:   Discussion: 71 year old female never smoker with allergic rhinitis, hx of recurrent urticaria, HTN and CKD who presents for follow-up. Previously seen for chronic cough. Work-up with PFTs demonstrated mild restrictive defect with mildly reduced DLCO however CT with no evidence of ILD. Reviewed recent CT Chest with unchanged nodule x 7 months. Incidental nodule seen with calculated malignancy risk of 9.9% -intermediate range. Will continue serial PFTs and chest imaging as noted below:  Incidental RML nodule 65m --ORDER CT Chest without contrast in 6 months (10/2022) --If stable, will plan for 6-12 month follow to complete recommended two year surveillance for nodule stability (May - Nov 2024)  Mild restrictive defect with reduced DLCO CT with no evidence of ILD --ORDER PFTs  10/2022 prior to next appointment  Chronic cough - persistent, stable --CONTINUE hycodan cough syrup nightly as needed. REFILL --CONTINUE pepcid --CONTINUE allergy regimen per Dr. GErnst Bowler Health Maintenance Immunization History  Administered Date(s) Administered   Fluad Quad(high Dose 65+) 07/29/2019, 09/20/2020, 07/18/2021   Influenza Split 08/03/2011, 09/09/2012   Influenza Whole 07/05/2010   Influenza, High Dose Seasonal PF 08/17/2016, 07/23/2017, 09/19/2018   Influenza,inj,Quad PF,6+ Mos 07/10/2013, 07/28/2014, 09/29/2015   PFIZER Comirnaty(Gray Top)Covid-19 Tri-Sucrose Vaccine 03/29/2021   PFIZER(Purple Top)SARS-COV-2 Vaccination 12/26/2019, 01/18/2020, 08/27/2020   Pfizer Covid-19 Vaccine Bivalent Booster 154yr& up 08/01/2021   Pneumococcal Conjugate-13 03/31/2015   Pneumococcal Polysaccharide-23 11/15/2014   Tdap 07/02/2012   Zoster Recombinat (Shingrix) 12/07/2021, 02/02/2022   Zoster, Live 07/15/2012   CT Lung Screen- not indicated. Never smoker  Orders Placed This Encounter  Procedures   CT Chest Wo Contrast    Standing Status:   Future    Standing Expiration Date:   04/24/2023    Scheduling Instructions:     Please schedule in 6 months (Dec. 2023). Pt will need to be scheduled for OV with JE after scan and PFTs to review results.    Order Specific Question:   Preferred imaging location?    Answer:   [facility]    Standing Status:   Future    Standing Expiration Date:   04/24/2023    Order Specific Question:   Where should this test be performed?    Answer:   [facility]   Meds ordered this encounter  Medications   HYDROcodone bit-homatropine (HYCODAN) 5-1.5 MG/5ML syrup    Sig: Take 5 mLs by mouth every 6 (six) hours as needed for cough.    Dispense:  240 mL    Refill:  0   Return in about 6 months (around 10/23/2022).  I have spent a total time of 30-minutes on the day of the appointment including chart  review, data review, collecting history, coordinating care and discussing medical diagnosis and plan with the patient/family. Past medical history, allergies, medications were reviewed. Pertinent imaging,  labs and tests included in this note have been reviewed and interpreted independently by me.  Tierra Bonita, MD [facility] 04/23/2022 10:01 AM  Office Number [phone]

## 2022-04-23 NOTE — Patient Instructions (Addendum)
Incidental RML nodule 81m --ORDER CT Chest without contrast in 6 months (10/2022) --If stable, will plan for 6-12 month follow to complete recommended two year surveillance for nodule stability (May - Nov 2024)  Mild restrictive defect with reduced DLCO CT with no evidence of ILD --ORDER PFTs 10/2022 prior to next appointment  Chronic cough - persistent, stable --CONTINUE hycodan cough syrup nightly as needed. REFILL --CONTINUE pepcid --CONTINUE allergy regimen per Dr. GErnst Bowler Follow-up with me in 6 months with PFTs prior to appointment

## 2022-05-14 ENCOUNTER — Other Ambulatory Visit: Payer: Self-pay | Admitting: Internal Medicine

## 2022-05-16 NOTE — Telephone Encounter (Signed)
Check [place] registry last filled 04/06/2022.Marland KitchenJohny Mooney

## 2022-05-18 ENCOUNTER — Other Ambulatory Visit: Payer: Self-pay | Admitting: Internal Medicine

## 2022-06-18 ENCOUNTER — Ambulatory Visit: Payer: Medicare HMO | Admitting: Internal Medicine

## 2022-06-19 ENCOUNTER — Ambulatory Visit (INDEPENDENT_AMBULATORY_CARE_PROVIDER_SITE_OTHER): Payer: Medicare HMO | Admitting: Internal Medicine

## 2022-06-19 ENCOUNTER — Encounter: Payer: Self-pay | Admitting: Internal Medicine

## 2022-06-19 DIAGNOSIS — M25561 Pain in right knee: Secondary | ICD-10-CM

## 2022-06-19 DIAGNOSIS — R911 Solitary pulmonary nodule: Secondary | ICD-10-CM | POA: Diagnosis not present

## 2022-06-19 DIAGNOSIS — M170 Bilateral primary osteoarthritis of knee: Secondary | ICD-10-CM | POA: Diagnosis not present

## 2022-06-19 DIAGNOSIS — N183 Chronic kidney disease, stage 3 unspecified: Secondary | ICD-10-CM | POA: Diagnosis not present

## 2022-06-19 DIAGNOSIS — R944 Abnormal results of kidney function studies: Secondary | ICD-10-CM

## 2022-06-19 DIAGNOSIS — K648 Other hemorrhoids: Secondary | ICD-10-CM

## 2022-06-19 MED ORDER — TRIAMCINOLONE ACETONIDE 0.1 % EX OINT: 1.0000 | TOPICAL_OINTMENT | Freq: Four times a day (QID) | CUTANEOUS | 1 refills | Status: DC

## 2022-06-19 MED ORDER — HYDROCORTISONE ACETATE 25 MG RE SUPP: 25.0000 mg | Freq: Two times a day (BID) | RECTAL | 1 refills | Status: DC

## 2022-06-19 NOTE — Progress Notes (Signed)
Subjective:  Patient ID: Jane Mooney, female    DOB: 1950/11/18  Age: 70 y.o. MRN: [id number]  CC: 58mofollow up (Patient has no questions or concerns)   HPI Jane Mooney for HTN, OA, R inner knee pain Pt lost wt on diet  Outpatient Medications Prior to Visit  Medication Sig Dispense Refill   acetaminophen (TYLENOL) 500 MG tablet Take 1,000 mg by mouth every 6 (six) hours as needed for moderate pain or headache.     amLODipine (NORVASC) 2.5 MG tablet Take 1 tablet (2.5 mg total) by mouth daily. 90 tablet 3   aspirin EC 81 MG tablet Take 1 tablet (81 mg total) by mouth daily. 100 tablet 3   cholecalciferol (VITAMIN D) 1000 units tablet Take 2 tablets (2,000 Units total) by mouth daily. (Patient taking differently: Take 1,000 Units by mouth daily.) 100 tablet 11   Cyanocobalamin (VITAMIN B-12) 1000 MCG SUBL Place 1 tablet (1,000 mcg total) under the tongue daily. 100 tablet 3   famotidine (PEPCID) 20 MG tablet TAKE 1 TABLET BY MOUTH EVERY DAY 90 tablet 2   fish oil-omega-3 fatty acids 1000 MG capsule Take 1 g by mouth daily.     fluticasone (FLONASE) 50 MCG/ACT nasal spray Place 1 spray into both nostrils daily as needed for allergies. 16 g 5   hydrOXYzine (ATARAX) 25 MG tablet TAKE 1-2 TABLETS AT NIGHT AS NEEDED FOR ITCHING. CAUTION AS THIS MEDICATION CAN MAKE YOU DROWSY 180 tablet 2   LORazepam (ATIVAN) 1 MG tablet TAKE 1 TABLET BY MOUTH TWICE A DAY AS NEEDED FOR ANXIETY 60 tablet 2   rosuvastatin (CRESTOR) 5 MG tablet TAKE 1 TABLET (5 MG TOTAL) BY MOUTH DAILY. 30 tablet 11   telmisartan (MICARDIS) 80 MG tablet TAKE 1 TABLET BY MOUTH EVERY DAY 90 tablet 3   triamterene-hydrochlorothiazide (MAXZIDE-25) 37.5-25 MG tablet TAKE 1/2 TABLET BY MOUTH EVERY DAY 90 tablet 3   venlafaxine XR (EFFEXOR-XR) 37.5 MG 24 hr capsule TAKE 1 CAPSULE BY MOUTH DAILY WITH BREAKFAST. 90 capsule 3   HYDROcodone bit-homatropine (HYCODAN) 5-1.5 MG/5ML syrup Take 5 mLs by mouth every 6 (six) hours as  needed for cough. 240 mL 0   triamcinolone ointment (KENALOG) 0.1 % Apply 1 application topically 4 (four) times daily. 80 g 1   amLODipine (NORVASC) 5 MG tablet Take 1 tablet by mouth daily.     aspirin EC 81 MG tablet Take 1 tablet by mouth daily.     capsicum (ZOSTRIX) 0.075 % topical cream APPLY 4 TIMES A DAY AS DIRECTED     cetirizine (ZYRTEC) 10 MG tablet Take 1 tablet (10 mg total) by mouth daily. 90 tablet 3   ibuprofen (ADVIL) 600 MG tablet      loratadine (CLARITIN) 10 MG tablet Take 1 tablet by mouth daily.     LORazepam (ATIVAN) 1 MG tablet Take 1 tablet by mouth 2 (two) times daily as needed.     losartan (COZAAR) 100 MG tablet Take 1 tablet by mouth daily.     naproxen (NAPROSYN) 500 MG tablet      fluconazole (DIFLUCAN) 200 MG tablet Take 1 tablet (200 mg total) by mouth daily. 90 tablet 0   NONFORMULARY OR COMPOUNDED ITEM [facility]  Antifungal nail #1 Apply to the affected nail (s) once (at bedtime) or twice daily Refills PRN 1 each 0   No facility-administered medications prior to visit.    ROS: Review of Systems  Constitutional:  Negative for  activity change, appetite change, chills, fatigue and unexpected weight change.  HENT:  Negative for congestion, mouth sores and sinus pressure.   Eyes:  Negative for visual disturbance.  Respiratory:  Negative for cough and chest tightness.   Gastrointestinal:  Negative for abdominal pain and nausea.  Genitourinary:  Negative for difficulty urinating, frequency and vaginal pain.  Musculoskeletal:  Positive for arthralgias and gait problem. Negative for back pain.  Skin:  Negative for pallor and rash.  Neurological:  Negative for dizziness, tremors, weakness, numbness and headaches.  Psychiatric/Behavioral:  Negative for confusion and sleep disturbance.     Objective:  BP 122/68 (BP Location: Left Arm, Patient Position: Sitting, Cuff Size: Normal)   Pulse 60   Temp 98.3 F (36.8 C) (Oral)   Ht '5\' 3"'$  (1.6 m)   Wt  177 lb (80.3 kg)   SpO2 98%   BMI 31.35 kg/m   BP Readings from Last 3 Encounters:  06/19/22 122/68  04/23/22 120/72  03/15/22 122/78    Wt Readings from Last 3 Encounters:  06/19/22 177 lb (80.3 kg)  04/23/22 177 lb 9.6 oz (80.6 kg)  03/15/22 176 lb 6.4 oz (80 kg)    Physical Exam Constitutional:      General: She is not in acute distress.    Appearance: She is well-developed. She is obese.  HENT:     Head: Normocephalic.     Right Ear: External ear normal.     Left Ear: External ear normal.     Nose: Nose normal.  Eyes:     General:        Right eye: No discharge.        Left eye: No discharge.     Conjunctiva/sclera: Conjunctivae normal.     Pupils: Pupils are equal, round, and reactive to light.  Neck:     Thyroid: No thyromegaly.     Vascular: No JVD.     Trachea: No tracheal deviation.  Cardiovascular:     Rate and Rhythm: Normal rate and regular rhythm.     Heart sounds: Normal heart sounds.  Pulmonary:     Effort: No respiratory distress.     Breath sounds: No stridor. No wheezing.  Abdominal:     General: Bowel sounds are normal. There is no distension.     Palpations: Abdomen is soft. There is no mass.     Tenderness: There is no abdominal tenderness. There is no guarding or rebound.  Musculoskeletal:        General: No tenderness.     Cervical back: Normal range of motion and neck supple. No rigidity.     Right lower leg: No edema.     Left lower leg: No edema.  Lymphadenopathy:     Cervical: No cervical adenopathy.  Skin:    Findings: No erythema or rash.  Neurological:     Mental Status: She is oriented to person, place, and time.     Cranial Nerves: No cranial nerve deficit.     Motor: No abnormal muscle tone.     Coordination: Coordination normal.     Gait: Gait abnormal.     Deep Tendon Reflexes: Reflexes normal.  Psychiatric:        Behavior: Behavior normal.        Thought Content: Thought content normal.        Judgment: Judgment  normal.    R kee w/pain Limping   Lab Results  Component Value Date   WBC 5.9 12/12/2021  HGB 12.4 12/12/2021   HCT 38.3 12/12/2021   PLT 226.0 12/12/2021   GLUCOSE 94 04/20/2022   CHOL 131 04/20/2022   TRIG 82.0 04/20/2022   HDL 51.70 04/20/2022   LDLDIRECT 153.6 07/06/2013   LDLCALC 63 04/20/2022   ALT 15 04/20/2022   AST 20 04/20/2022   NA 138 04/20/2022   K 4.3 04/20/2022   CL 101 04/20/2022   CREATININE 1.16 04/20/2022   BUN 18 04/20/2022   CO2 30 04/20/2022   TSH 2.07 08/30/2021   INR 1.0 06/23/2020   HGBA1C 5.7 04/20/2022    CT Chest Wo Contrast  Result Date: 04/22/2022 CLINICAL DATA:  Follow-up lung nodule. EXAM: CT CHEST WITHOUT CONTRAST TECHNIQUE: Multidetector CT imaging of the chest was performed following the standard protocol without IV contrast. RADIATION DOSE REDUCTION: This exam was performed according to the departmental dose-optimization program which includes automated exposure control, adjustment of the mA and/or kV according to patient size and/or use of iterative reconstruction technique. COMPARISON:  Chest CT dated 10/11/2021. FINDINGS: Evaluation of this exam is limited in the absence of intravenous contrast. Cardiovascular: There is no cardiomegaly or pericardial effusion. There is coronary vascular calcification of the LAD. Mild atherosclerotic calcification of the thoracic aorta. No aneurysmal dilatation. The central pulmonary arteries are grossly unremarkable. Mediastinum/Nodes: No hilar or mediastinal adenopathy. The esophagus is grossly unremarkable. No mediastinal fluid collection. Lungs/Pleura: No interval change in the size of the right middle lobe nodule measuring up to approximately 8 mm. Follow-up as per recommendation of prior CT at 18-24 months from the CT of 10/11/2021 recommended. No consolidative changes. There is no pleural effusion or pneumothorax. The central airways are patent. Upper Abdomen: No acute abnormality. Musculoskeletal: No  chest wall mass or suspicious bone lesions identified. IMPRESSION: 1. No interval change in the size of the right middle lobe nodule measuring up to approximately 8 mm. Follow-up as per recommendation of prior CT at 18-24 months from the CT of 10/11/2021 recommended. 2. Coronary vascular calcification of the LAD. 3. Aortic Atherosclerosis (ICD10-I70.0). Electronically Signed   By: Anner Crete M.D.   On: 04/22/2022 00:36    Assessment & Plan:   Problem List Items Addressed This Visit     CRI (chronic renal insufficiency), stage 3 (moderate) (HCC)    Check GFR/CMET Hydrate well      Decreased GFR    Hydrate well      Hemorrhoid    Kenalog oint Anusol HC      Relevant Medications   amLODipine (NORVASC) 5 MG tablet   aspirin EC 81 MG tablet   losartan (COZAAR) 100 MG tablet   Incidental lung nodule, > 50m and < 845m   Repeat Chest CT pending in Dec 2023      KNEE PAIN    R knee b.anserina bursitis Options discussed - will inject. Use ice Blue-Emu cream was recommended to use 2-3 times a day         Meds ordered this encounter  Medications   triamcinolone ointment (KENALOG) 0.1 %    Sig: Apply 1 Application topically 4 (four) times daily.    Dispense:  80 g    Refill:  1   hydrocortisone (ANUSOL-HC) 25 MG suppository    Sig: Place 1 suppository (25 mg total) rectally 2 (two) times daily.    Dispense:  20 suppository    Refill:  1      Follow-up: No follow-ups on file.  AlWalker KehrMD

## 2022-06-19 NOTE — Assessment & Plan Note (Signed)
Kenalog oint Anusol HC

## 2022-06-19 NOTE — Assessment & Plan Note (Signed)
Repeat Chest CT pending in Dec 2023

## 2022-06-19 NOTE — Assessment & Plan Note (Signed)
Blue-Emu cream was recommended to use 2-3 times a day F/u w/Dr Georgina Snell

## 2022-06-19 NOTE — Assessment & Plan Note (Signed)
R knee b.anserina bursitis Options discussed - will inject. Use ice Blue-Emu cream was recommended to use 2-3 times a day

## 2022-06-19 NOTE — Assessment & Plan Note (Signed)
Check GFR/CMET Hydrate well

## 2022-06-19 NOTE — Assessment & Plan Note (Signed)
Hydrate well 

## 2022-06-21 ENCOUNTER — Telehealth: Payer: Self-pay | Admitting: Internal Medicine

## 2022-06-21 NOTE — Telephone Encounter (Signed)
Pt is requesting a call from nurse to discuss rx for COVID. Pt stated she called last night and spoke with a triage nurse and they advised her to call the office this morning and ask Dr. Alain Marion for an rx for [place]. Pt stated she is unsure if she even needs an rx. She stated the only symptoms she's having today is a cough. Last night she had a temperature of 101.1. She said she does not know what exactly the rx does so she's not sure if she even needs it.    Please advise

## 2022-06-22 MED ORDER — CEFDINIR 300 MG PO CAPS: 300.0000 mg | ORAL_CAPSULE | Freq: Two times a day (BID) | ORAL | 0 refills | Status: DC

## 2022-06-22 NOTE — Telephone Encounter (Signed)
I will send a prescription for antibiotic.  Thanks

## 2022-06-22 NOTE — Telephone Encounter (Signed)
Thank you I will let the pt know.

## 2022-06-22 NOTE — Telephone Encounter (Addendum)
Pt reports fever last night 100.4 took Tylenol and woke up this morning sweaty. No Fever this morning. Pt does have a cough with Greenish colored mucus. Chest discomfort from coughing.   Pharmacy: [id number] [place] [place]

## 2022-06-25 ENCOUNTER — Other Ambulatory Visit: Payer: Self-pay | Admitting: Internal Medicine

## 2022-06-25 ENCOUNTER — Other Ambulatory Visit: Payer: Self-pay | Admitting: Allergy & Immunology

## 2022-07-10 ENCOUNTER — Encounter (HOSPITAL_BASED_OUTPATIENT_CLINIC_OR_DEPARTMENT_OTHER): Payer: Self-pay

## 2022-07-10 ENCOUNTER — Emergency Department (HOSPITAL_BASED_OUTPATIENT_CLINIC_OR_DEPARTMENT_OTHER): Payer: Medicare HMO | Admitting: Radiology

## 2022-07-10 ENCOUNTER — Emergency Department (HOSPITAL_BASED_OUTPATIENT_CLINIC_OR_DEPARTMENT_OTHER)
Admission: EM | Admit: 2022-07-10 | Discharge: 2022-07-10 | Disposition: A | Discharge status: Home / Self Care | Payer: Medicare HMO | Attending: Emergency Medicine | Admitting: Emergency Medicine

## 2022-07-10 ENCOUNTER — Other Ambulatory Visit: Payer: Self-pay

## 2022-07-10 DIAGNOSIS — M25561 Pain in right knee: Secondary | ICD-10-CM | POA: Diagnosis not present

## 2022-07-10 DIAGNOSIS — C5700 Malignant neoplasm of unspecified fallopian tube: Secondary | ICD-10-CM

## 2022-07-10 DIAGNOSIS — M25572 Pain in left ankle and joints of left foot: Secondary | ICD-10-CM | POA: Diagnosis not present

## 2022-07-10 DIAGNOSIS — M25562 Pain in left knee: Secondary | ICD-10-CM | POA: Diagnosis not present

## 2022-07-10 DIAGNOSIS — R600 Localized edema: Secondary | ICD-10-CM | POA: Diagnosis not present

## 2022-07-10 DIAGNOSIS — M170 Bilateral primary osteoarthritis of knee: Secondary | ICD-10-CM | POA: Diagnosis not present

## 2022-07-10 MED ORDER — ACETAMINOPHEN 500 MG PO TABS
1000.0000 mg | ORAL_TABLET | Freq: Once | ORAL | Status: AC
  Administered 2022-07-10: 1000 mg via ORAL
  Filled 2022-07-10: qty 2

## 2022-07-10 NOTE — ED Provider Notes (Signed)
[facility] Provider Note   CSN: [id number] Arrival date & time: 07/10/22  1544     History Chief Complaint  Patient presents with  . Fall    HPI Jane Mooney is a 71 y.o. female presenting for fall.  She states that she was walking over a curb when she lost her balance and her foot caught the curb.  She fell onto her hands and knees.  She has pain in her left knee, her right knee, her left ankle.  She is a history of left knee total replacement and chronic right knee pain.  She also hit one of her front teeth but was already at the dentist appointment so the dentist repaired the tooth on scene. She denies fevers or chills nausea or vomiting, syncope shortness of breath.  Symptoms grossly improved after 2 hours of waiting in the emergency department.  No medications prior to arrival.   Patient's recorded medical, surgical, social, medication list and allergies were reviewed in the Snapshot window as part of the initial history.   Review of Systems   Review of Systems  Constitutional:  Negative for chills and fever.  HENT:  Negative for ear pain and sore throat.   Eyes:  Negative for pain and visual disturbance.  Respiratory:  Negative for cough and shortness of breath.   Cardiovascular:  Negative for chest pain and palpitations.  Gastrointestinal:  Negative for abdominal pain and vomiting.  Genitourinary:  Negative for dysuria and hematuria.  Musculoskeletal:  Negative for arthralgias and back pain.  Skin:  Negative for color change and rash.  Neurological:  Negative for seizures and syncope.  All other systems reviewed and are negative.   Physical Exam Updated Vital Signs BP (!) 144/56 (BP Location: Left Arm)   Pulse 61   Temp 98.4 F (36.9 C)   Resp 18   Ht '5\' 3"'$  (1.6 m)   Wt 79.8 kg   SpO2 100%   BMI 31.18 kg/m  Physical Exam Vitals and nursing note reviewed.  Constitutional:      General: She is not in acute distress.     Appearance: She is well-developed.  HENT:     Head: Normocephalic and atraumatic.  Eyes:     Conjunctiva/sclera: Conjunctivae normal.  Cardiovascular:     Rate and Rhythm: Normal rate and regular rhythm.     Heart sounds: No murmur heard. Pulmonary:     Effort: Pulmonary effort is normal. No respiratory distress.     Breath sounds: Normal breath sounds.  Abdominal:     General: There is no distension.     Palpations: Abdomen is soft.     Tenderness: There is no abdominal tenderness. There is no right CVA tenderness or left CVA tenderness.  Musculoskeletal:        General: Tenderness (Tenderness to patient in the left knee, right knee left ankle with all active and pain passive range of motion.  However patient able to ambulate.) present. No swelling. Normal range of motion.     Cervical back: Neck supple.  Skin:    General: Skin is warm and dry.  Neurological:     General: No focal deficit present.     Mental Status: She is alert and oriented to person, place, and time. Mental status is at baseline.     Cranial Nerves: No cranial nerve deficit.     ED Course/ Medical Decision Making/ A&P    Procedures Procedures   Medications Ordered in ED  Medications  acetaminophen (TYLENOL) tablet 1,000 mg (has no administration in time range)   Medical Decision Making:    Jane Mooney is a 71 y.o. female who presented to the ED today with a moderate mechanisma trauma, detailed above.    Patient's presentation is complicated by their history of multiple comorbid medical problems.  Patient placed on continuous vitals and telemetry monitoring while in ED which was reviewed periodically.   Given this mechanism of trauma, a full physical exam was performed. Notably, patient was hemodynamically stable in no acute distress..   Reviewed and confirmed nursing documentation for past medical history, family history, social history.    Initial Assessment/Plan:   This is a patient presenting  with a moderate mechanism trauma.  As such, I have considered intracranial injuries including intracranial hemorrhage, intrathoracic injuries including blunt myocardial or blunt lung injury, blunt abdominal injuries including aortic dissection, bladder injury, spleen injury, liver injury and I have considered orthopedic injuries including extremity or spinal injury.  With the patient's presentation of moderate mechanism trauma but an otherwise reassuring exam, patient warrants targeted evaluation for potential traumatic injuries. Will proceed with targeted evaluation for potential injuries. Will proceed with x-ray left knee x-ray right knee x-ray left ankle. Objective evaluation resulted with no acute pathology or injury.  Final Reassessment and Plan:   On repeat evaluation, patient is ambulatory, overall well-appearing.  Informed patient of findings and patient feels symptomatically comfortable with outpatient care management.  Plan for patient to follow-up with orthopedics as scheduled.  Patient discharged with no further acute events.   Clinical Impression: No diagnosis found.   Data Unavailable   Final Clinical Impression(s) / ED Diagnoses Final diagnoses:  None    Rx / DC Orders ED Discharge Orders     None         Tretha Sciara, MD 07/10/22 2333

## 2022-07-10 NOTE — ED Triage Notes (Signed)
Patient here POV from Home.  Patient was Stepping Over Curb when she Tripped on it and fell forward approximately a Few Hours ago.   No LOC. No Anticoagulants. No Known Head Injury. Pain to Bilateral Knees and Left Ankle.   NAD Noted During Triage. A&OX4. GCS 15. BIB Wheelchair.

## 2022-07-10 NOTE — ED Notes (Signed)
Patient given discharge instructions. Questions were answered. Patient verbalized understanding of discharge instructions and care at home.  Discharged with family 

## 2022-08-09 DIAGNOSIS — H25813 Combined forms of age-related cataract, bilateral: Secondary | ICD-10-CM | POA: Diagnosis not present

## 2022-08-09 DIAGNOSIS — H43393 Other vitreous opacities, bilateral: Secondary | ICD-10-CM | POA: Diagnosis not present

## 2022-08-09 DIAGNOSIS — H04123 Dry eye syndrome of bilateral lacrimal glands: Secondary | ICD-10-CM | POA: Diagnosis not present

## 2022-08-24 ENCOUNTER — Other Ambulatory Visit (HOSPITAL_BASED_OUTPATIENT_CLINIC_OR_DEPARTMENT_OTHER): Payer: Self-pay

## 2022-08-24 MED ORDER — INFLUENZA VAC A&B SA ADJ QUAD 0.5 ML IM PRSY
PREFILLED_SYRINGE | INTRAMUSCULAR | 0 refills | Status: DC
  Filled 2022-08-24: qty 0.5, 1d supply, fill #0

## 2022-08-24 MED ORDER — COVID-19 MRNA 2023-2024 VACCINE (COMIRNATY) 0.3 ML INJECTION
INTRAMUSCULAR | 0 refills | Status: DC
  Filled 2022-08-24: qty 0.3, 1d supply, fill #0

## 2022-09-11 DIAGNOSIS — H25812 Combined forms of age-related cataract, left eye: Secondary | ICD-10-CM | POA: Diagnosis not present

## 2022-09-19 ENCOUNTER — Ambulatory Visit: Payer: Medicare HMO | Admitting: Internal Medicine

## 2022-09-19 ENCOUNTER — Encounter: Payer: Self-pay | Admitting: Internal Medicine

## 2022-09-19 ENCOUNTER — Ambulatory Visit (INDEPENDENT_AMBULATORY_CARE_PROVIDER_SITE_OTHER): Payer: Medicare HMO | Admitting: Internal Medicine

## 2022-09-19 VITALS — BP 122/72 | HR 55 | Temp 99.3°F | Ht 63.0 in | Wt 178.8 lb

## 2022-09-19 DIAGNOSIS — E538 Deficiency of other specified B group vitamins: Secondary | ICD-10-CM

## 2022-09-19 DIAGNOSIS — G8929 Other chronic pain: Secondary | ICD-10-CM

## 2022-09-19 DIAGNOSIS — K219 Gastro-esophageal reflux disease without esophagitis: Secondary | ICD-10-CM

## 2022-09-19 DIAGNOSIS — M5440 Lumbago with sciatica, unspecified side: Secondary | ICD-10-CM

## 2022-09-19 DIAGNOSIS — N183 Chronic kidney disease, stage 3 unspecified: Secondary | ICD-10-CM | POA: Diagnosis not present

## 2022-09-19 DIAGNOSIS — F4321 Adjustment disorder with depressed mood: Secondary | ICD-10-CM

## 2022-09-19 DIAGNOSIS — R739 Hyperglycemia, unspecified: Secondary | ICD-10-CM | POA: Diagnosis not present

## 2022-09-19 DIAGNOSIS — F411 Generalized anxiety disorder: Secondary | ICD-10-CM | POA: Diagnosis not present

## 2022-09-19 LAB — CBC WITH DIFFERENTIAL/PLATELET
Basophils Absolute: 0.1 10*3/uL (ref 0.0–0.1)
Basophils Relative: 1.3 % (ref 0.0–3.0)
Eosinophils Absolute: 0.2 10*3/uL (ref 0.0–0.7)
Eosinophils Relative: 2.8 % (ref 0.0–5.0)
HCT: 37.1 % (ref 36.0–46.0)
Hemoglobin: 12 g/dL (ref 12.0–15.0)
Lymphocytes Relative: 44 % (ref 12.0–46.0)
Lymphs Abs: 2.4 10*3/uL (ref 0.7–4.0)
MCHC: 32.3 g/dL (ref 30.0–36.0)
MCV: 80.4 fl (ref 78.0–100.0)
Monocytes Absolute: 0.7 10*3/uL (ref 0.1–1.0)
Monocytes Relative: 12 % (ref 3.0–12.0)
Neutro Abs: 2.2 10*3/uL (ref 1.4–7.7)
Neutrophils Relative %: 39.9 % — ABNORMAL LOW (ref 43.0–77.0)
Platelets: 190 10*3/uL (ref 150.0–400.0)
RBC: 4.61 Mil/uL (ref 3.87–5.11)
RDW: 14.5 % (ref 11.5–15.5)
WBC: 5.5 10*3/uL (ref 4.0–10.5)

## 2022-09-19 LAB — HEMOGLOBIN A1C: Hgb A1c MFr Bld: 5.8 % (ref 4.6–6.5)

## 2022-09-19 MED ORDER — VENLAFAXINE HCL ER 37.5 MG PO CP24
37.5000 mg | ORAL_CAPSULE | Freq: Every day | ORAL | 3 refills | Status: DC
Start: 2022-09-19 — End: 2022-12-19

## 2022-09-19 MED ORDER — AMLODIPINE BESYLATE 2.5 MG PO TABS: 2.5000 mg | ORAL_TABLET | Freq: Every day | ORAL | 3 refills | Status: DC

## 2022-09-19 MED ORDER — FAMOTIDINE 20 MG PO TABS: 20.0000 mg | ORAL_TABLET | Freq: Every day | ORAL | 2 refills | Status: DC

## 2022-09-19 MED ORDER — TELMISARTAN 80 MG PO TABS: 80.0000 mg | ORAL_TABLET | Freq: Every day | ORAL | 3 refills | Status: DC

## 2022-09-19 MED ORDER — LORAZEPAM 1 MG PO TABS: ORAL_TABLET | ORAL | 3 refills | Status: DC

## 2022-09-19 MED ORDER — TRIAMTERENE-HCTZ 37.5-25 MG PO TABS
0.5000 | ORAL_TABLET | Freq: Every day | ORAL | 3 refills | Status: DC
Start: 2022-09-19 — End: 2023-09-23

## 2022-09-19 NOTE — Progress Notes (Signed)
Subjective:  Patient ID: Jane Mooney, female    DOB: Dec 15, 1950  Age: 71 y.o. MRN: [id number]  CC: Follow-up (3 MONTH F/U)   HPI Jane Mooney presents for recent COVID, recent mechanical fall - L knee contusion, OA, HTN, anxiety  Outpatient Medications Prior to Visit  Medication Sig Dispense Refill   acetaminophen (TYLENOL) 500 MG tablet Take 1,000 mg by mouth every 6 (six) hours as needed for moderate pain or headache.     aspirin EC 81 MG tablet Take 1 tablet (81 mg total) by mouth daily. 100 tablet 3   capsicum (ZOSTRIX) 0.075 % topical cream APPLY 4 TIMES A DAY AS DIRECTED     cholecalciferol (VITAMIN D) 1000 units tablet Take 2 tablets (2,000 Units total) by mouth daily. (Patient taking differently: Take 1,000 Units by mouth daily.) 100 tablet 11   Cyanocobalamin (VITAMIN B-12) 1000 MCG SUBL Place 1 tablet (1,000 mcg total) under the tongue daily. 100 tablet 3   fish oil-omega-3 fatty acids 1000 MG capsule Take 1 g by mouth daily.     fluticasone (FLONASE) 50 MCG/ACT nasal spray Place 1 spray into both nostrils daily as needed for allergies. 16 g 5   hydrocortisone (ANUSOL-HC) 25 MG suppository Place 1 suppository (25 mg total) rectally 2 (two) times daily. 20 suppository 1   hydrOXYzine (ATARAX) 25 MG tablet TAKE 1-2 TABLETS AT NIGHT AS NEEDED FOR ITCHING. CAUTION AS THIS MEDICATION CAN MAKE YOU DROWSY 180 tablet 2   ibuprofen (ADVIL) 600 MG tablet      loratadine (CLARITIN) 10 MG tablet Take 1 tablet by mouth daily.     losartan (COZAAR) 100 MG tablet Take 1 tablet by mouth daily.     rosuvastatin (CRESTOR) 5 MG tablet TAKE 1 TABLET (5 MG TOTAL) BY MOUTH DAILY. 30 tablet 11   triamcinolone ointment (KENALOG) 0.1 % Apply 1 Application topically 4 (four) times daily. 80 g 1   amLODipine (NORVASC) 2.5 MG tablet Take 1 tablet (2.5 mg total) by mouth daily. 90 tablet 3   famotidine (PEPCID) 20 MG tablet TAKE 1 TABLET BY MOUTH EVERY DAY 90 tablet 2   LORazepam (ATIVAN) 1 MG tablet  TAKE 1 TABLET BY MOUTH TWICE A DAY AS NEEDED FOR ANXIETY 60 tablet 2   telmisartan (MICARDIS) 80 MG tablet TAKE 1 TABLET BY MOUTH EVERY DAY 90 tablet 1   triamterene-hydrochlorothiazide (MAXZIDE-25) 37.5-25 MG tablet TAKE 1/2 TABLET BY MOUTH EVERY DAY 90 tablet 3   venlafaxine XR (EFFEXOR-XR) 37.5 MG 24 hr capsule TAKE 1 CAPSULE BY MOUTH DAILY WITH BREAKFAST. 90 capsule 3   amLODipine (NORVASC) 5 MG tablet Take 1 tablet by mouth daily.     aspirin EC 81 MG tablet Take 1 tablet by mouth daily.     cefdinir (OMNICEF) 300 MG capsule Take 1 capsule (300 mg total) by mouth 2 (two) times daily. (Patient not taking: Reported on 09/19/2022) 20 capsule 0   cetirizine (ZYRTEC) 10 MG tablet Take 1 tablet (10 mg total) by mouth daily. 90 tablet 3   COVID-19 mRNA vaccine 2023-2024 (COMIRNATY) SUSP injection Inject into the muscle. (Patient not taking: Reported on 09/19/2022) 0.3 mL 0   influenza vaccine adjuvanted (FLUAD) 0.5 ML injection Inject into the muscle. (Patient not taking: Reported on 09/19/2022) 0.5 mL 0   LORazepam (ATIVAN) 1 MG tablet Take 1 tablet by mouth 2 (two) times daily as needed.     naproxen (NAPROSYN) 500 MG tablet      No  facility-administered medications prior to visit.    ROS: Review of Systems  Constitutional:  Positive for fatigue. Negative for activity change, appetite change, chills and unexpected weight change.  HENT:  Negative for congestion, mouth sores and sinus pressure.   Eyes:  Negative for visual disturbance.  Respiratory:  Negative for cough and chest tightness.   Gastrointestinal:  Negative for abdominal pain and nausea.  Genitourinary:  Negative for difficulty urinating, frequency and vaginal pain.  Musculoskeletal:  Positive for arthralgias and gait problem. Negative for back pain.  Skin:  Negative for pallor and rash.  Neurological:  Negative for dizziness, tremors, weakness, numbness and headaches.  Psychiatric/Behavioral:  Negative for confusion and sleep  disturbance. The patient is nervous/anxious.     Objective:  BP 122/72 (BP Location: Left Arm)   Pulse (!) 55   Temp 99.3 F (37.4 C) (Oral)   Ht '5\' 3"'$  (1.6 m)   Wt 178 lb 12.8 oz (81.1 kg)   SpO2 96%   BMI 31.67 kg/m   BP Readings from Last 3 Encounters:  09/19/22 122/72  07/10/22 139/66  06/19/22 122/68    Wt Readings from Last 3 Encounters:  09/19/22 178 lb 12.8 oz (81.1 kg)  07/10/22 176 lb (79.8 kg)  06/19/22 177 lb (80.3 kg)    Physical Exam Constitutional:      General: She is not in acute distress.    Appearance: She is well-developed.  HENT:     Head: Normocephalic.     Right Ear: External ear normal.     Left Ear: External ear normal.     Nose: Nose normal.  Eyes:     General:        Right eye: No discharge.        Left eye: No discharge.     Conjunctiva/sclera: Conjunctivae normal.     Pupils: Pupils are equal, round, and reactive to light.  Neck:     Thyroid: No thyromegaly.     Vascular: No JVD.     Trachea: No tracheal deviation.  Cardiovascular:     Rate and Rhythm: Normal rate and regular rhythm.     Heart sounds: Normal heart sounds.  Pulmonary:     Effort: No respiratory distress.     Breath sounds: No stridor. No wheezing.  Abdominal:     General: Bowel sounds are normal. There is no distension.     Palpations: Abdomen is soft. There is no mass.     Tenderness: There is no abdominal tenderness. There is no guarding or rebound.  Musculoskeletal:        General: No tenderness.     Cervical back: Normal range of motion and neck supple. No rigidity.  Lymphadenopathy:     Cervical: No cervical adenopathy.  Skin:    Findings: No erythema or rash.  Neurological:     Mental Status: She is oriented to person, place, and time.     Cranial Nerves: No cranial nerve deficit.     Motor: No weakness or abnormal muscle tone.     Coordination: Coordination normal.     Gait: Gait abnormal.     Deep Tendon Reflexes: Reflexes normal.  Psychiatric:         Behavior: Behavior normal.        Thought Content: Thought content normal.        Judgment: Judgment normal.     Lab Results  Component Value Date   WBC 5.9 12/12/2021   HGB 12.4 12/12/2021  HCT 38.3 12/12/2021   PLT 226.0 12/12/2021   GLUCOSE 94 04/20/2022   CHOL 131 04/20/2022   TRIG 82.0 04/20/2022   HDL 51.70 04/20/2022   LDLDIRECT 153.6 07/06/2013   LDLCALC 63 04/20/2022   ALT 15 04/20/2022   AST 20 04/20/2022   NA 138 04/20/2022   K 4.3 04/20/2022   CL 101 04/20/2022   CREATININE 1.16 04/20/2022   BUN 18 04/20/2022   CO2 30 04/20/2022   TSH 2.07 08/30/2021   INR 1.0 06/23/2020   HGBA1C 5.7 04/20/2022    DG Ankle Complete Left  Result Date: 07/10/2022 CLINICAL DATA:  Fall EXAM: LEFT ANKLE COMPLETE - 3+ VIEW COMPARISON:  None Available. FINDINGS: There is no evidence of fracture, dislocation, or joint effusion. There is no evidence of arthropathy or other focal bone abnormality. Mild subcutaneus soft tissue edema the ankle. IMPRESSION: No acute displaced fracture or dislocation. Electronically Signed   By: Iven Finn M.D.   On: 07/10/2022 17:05   DG Knee Complete 4 Views Right  Result Date: 07/10/2022 CLINICAL DATA:  Knee pain after fall. EXAM: RIGHT KNEE - COMPLETE 4+ VIEW COMPARISON:  Radiograph December 22, 2019 FINDINGS: No evidence of fracture, dislocation, or joint effusion. Tricompartment degenerative change worse in the medial tibiofemoral compartment. Patellar enthesophytes. Soft tissues are unremarkable. IMPRESSION: No acute osseous abnormality identified. Slight interval increase in the mild tricompartment degenerative change of the knee worse in the medial tibiofemoral compartment. Electronically Signed   By: Dahlia Bailiff M.D.   On: 07/10/2022 17:01   DG Knee Complete 4 Views Left  Result Date: 07/10/2022 CLINICAL DATA:  Fall EXAM: LEFT KNEE - COMPLETE 4+ VIEW COMPARISON:  X-ray left knee 12/22/2019 FINDINGS: Total left knee arthroplasty. No  evidence of fracture, dislocation, or joint effusion. No evidence of arthropathy or other focal bone abnormality. Soft tissues are unremarkable. Vascular calcifications. IMPRESSION: No acute displaced fracture or dislocation in a patient status post total left knee arthroplasty. Electronically Signed   By: Iven Finn M.D.   On: 07/10/2022 16:59    Assessment & Plan:   Problem List Items Addressed This Visit     Anxiety state    Chronic On Effexor XR Lorazepam prn  Potential benefits of a long term benzodiazepines  use as well as potential risks  and complications were explained to the patient and were aknowledged.      Relevant Medications   LORazepam (ATIVAN) 1 MG tablet   venlafaxine XR (EFFEXOR-XR) 37.5 MG 24 hr capsule   Situational depression    Cont on Effexor XR       Relevant Medications   LORazepam (ATIVAN) 1 MG tablet   venlafaxine XR (EFFEXOR-XR) 37.5 MG 24 hr capsule   Other Relevant Orders   CBC with Differential/Platelet   LOW BACK PAIN    Cont w/wt loss Mobic      Vitamin B 12 deficiency - Primary    Cont on B12      GERD (gastroesophageal reflux disease)    On Pepcid      Relevant Medications   famotidine (PEPCID) 20 MG tablet   CRI (chronic renal insufficiency), stage 3 (moderate) (HCC)    Monitor labs Avoid NSAIDs.  Hydrate well      Relevant Orders   CBC with Differential/Platelet   Hemoglobin A1c   Other Visit Diagnoses     Hyperglycemia       Relevant Orders   Hemoglobin A1c         Meds  ordered this encounter  Medications   amLODipine (NORVASC) 2.5 MG tablet    Sig: Take 1 tablet (2.5 mg total) by mouth daily.    Dispense:  90 tablet    Refill:  3   famotidine (PEPCID) 20 MG tablet    Sig: Take 1 tablet (20 mg total) by mouth daily.    Dispense:  90 tablet    Refill:  2   LORazepam (ATIVAN) 1 MG tablet    Sig: TAKE 1 TABLET BY MOUTH TWICE A DAY AS NEEDED FOR ANXIETY    Dispense:  60 tablet    Refill:  3    This  request is for a new prescription for a controlled substance as required by Federal/State law.   telmisartan (MICARDIS) 80 MG tablet    Sig: Take 1 tablet (80 mg total) by mouth daily.    Dispense:  90 tablet    Refill:  3   triamterene-hydrochlorothiazide (MAXZIDE-25) 37.5-25 MG tablet    Sig: Take 0.5 tablets by mouth daily.    Dispense:  90 tablet    Refill:  3   venlafaxine XR (EFFEXOR-XR) 37.5 MG 24 hr capsule    Sig: Take 1 capsule (37.5 mg total) by mouth daily with breakfast.    Dispense:  90 capsule    Refill:  3      Follow-up: Return in about 3 months (around 12/20/2022) for a follow-up visit.  [name], MD

## 2022-09-19 NOTE — Assessment & Plan Note (Signed)
Monitor labs Avoid NSAIDs.  Hydrate well

## 2022-09-19 NOTE — Assessment & Plan Note (Signed)
Chronic On Effexor XR Lorazepam prn  Potential benefits of a long term benzodiazepines  use as well as potential risks  and complications were explained to the patient and were aknowledged.

## 2022-09-19 NOTE — Assessment & Plan Note (Signed)
Cont on B12 ?

## 2022-09-19 NOTE — Assessment & Plan Note (Signed)
Cont on Effexor XR ?

## 2022-09-19 NOTE — Assessment & Plan Note (Signed)
On Pepcid 

## 2022-09-19 NOTE — Assessment & Plan Note (Signed)
Cont w/wt loss Mobic

## 2022-10-11 ENCOUNTER — Other Ambulatory Visit: Payer: Self-pay | Admitting: Internal Medicine

## 2022-10-11 DIAGNOSIS — Z1231 Encounter for screening mammogram for malignant neoplasm of breast: Secondary | ICD-10-CM

## 2022-10-15 ENCOUNTER — Ambulatory Visit (HOSPITAL_COMMUNITY)
Admission: RE | Admit: 2022-10-15 | Discharge: 2022-10-15 | Disposition: A | Discharge status: Home / Self Care | Payer: Medicare HMO | Source: Ambulatory Visit | Attending: Internal Medicine | Admitting: Internal Medicine

## 2022-10-15 DIAGNOSIS — R911 Solitary pulmonary nodule: Secondary | ICD-10-CM | POA: Diagnosis not present

## 2022-10-15 DIAGNOSIS — R918 Other nonspecific abnormal finding of lung field: Secondary | ICD-10-CM | POA: Diagnosis not present

## 2022-10-15 DIAGNOSIS — J9811 Atelectasis: Secondary | ICD-10-CM | POA: Diagnosis not present

## 2022-10-16 ENCOUNTER — Ambulatory Visit
Admission: RE | Admit: 2022-10-16 | Discharge: 2022-10-16 | Disposition: A | Discharge status: Home / Self Care | Payer: Medicare HMO | Source: Ambulatory Visit | Attending: Internal Medicine | Admitting: Internal Medicine

## 2022-10-16 DIAGNOSIS — Z1231 Encounter for screening mammogram for malignant neoplasm of breast: Secondary | ICD-10-CM | POA: Diagnosis not present

## 2022-10-16 DIAGNOSIS — H2511 Age-related nuclear cataract, right eye: Secondary | ICD-10-CM | POA: Diagnosis not present

## 2022-10-19 ENCOUNTER — Telehealth: Payer: Self-pay | Admitting: Internal Medicine

## 2022-10-19 DIAGNOSIS — H25811 Combined forms of age-related cataract, right eye: Secondary | ICD-10-CM | POA: Diagnosis not present

## 2022-10-19 NOTE — Telephone Encounter (Signed)
LVM for pt to rtn my call to schedule AWV with [facility] call back # [phone] 

## 2022-10-24 ENCOUNTER — Telehealth (HOSPITAL_BASED_OUTPATIENT_CLINIC_OR_DEPARTMENT_OTHER): Payer: Self-pay | Admitting: Pulmonary Disease

## 2022-10-24 DIAGNOSIS — R911 Solitary pulmonary nodule: Secondary | ICD-10-CM

## 2022-10-25 NOTE — Telephone Encounter (Signed)
ATC lvmtcb to schedule f/u with Loanne Drilling in Dec/Jan

## 2022-11-01 ENCOUNTER — Ambulatory Visit (INDEPENDENT_AMBULATORY_CARE_PROVIDER_SITE_OTHER): Payer: Medicare HMO

## 2022-11-01 VITALS — Ht 63.0 in | Wt 178.8 lb

## 2022-11-01 DIAGNOSIS — Z0000 Encounter for general adult medical examination without abnormal findings: Secondary | ICD-10-CM

## 2022-11-01 NOTE — Patient Instructions (Signed)
[name] , Thank you for taking time to come for your Medicare Wellness Visit. I appreciate your ongoing commitment to your health goals. Please review the following plan we discussed and let me know if I can assist you in the future.   These are the goals we discussed:  Goals      Weight < 160 lb (72.576 kg)     Continue with [employer] program by exercising more. [employer] has great social support and calorie tracks. I want to get out more also.          This is a list of the screening recommended for you and due dates:  Health Maintenance  Topic Date Due   DTaP/Tdap/Td vaccine (2 - Td or Tdap) 07/02/2022   COVID-19 Vaccine (7 - 2023-24 season) 10/19/2022   Medicare Annual Wellness Visit  11/02/2023   Colon Cancer Screening  12/24/2023   Mammogram  10/16/2024   Pneumonia Vaccine  Completed   Flu Shot  Completed   DEXA scan (bone density measurement)  Completed   Hepatitis C Screening: USPSTF Recommendation to screen - Ages 65-79 yo.  Completed   Zoster (Shingles) Vaccine  Completed   HPV Vaccine  Aged Out    Advanced directives: No  Conditions/risks identified: Yes  Next appointment: Follow up in one year for your annual wellness visit.   Preventive Care 73 Years and Older, Female Preventive care refers to lifestyle choices and visits with your health care provider that can promote health and wellness. What does preventive care include? A yearly physical exam. This is also called an annual well check. Dental exams once or twice a year. Routine eye exams. Ask your health care provider how often you should have your eyes checked. Personal lifestyle choices, including: Daily care of your teeth and gums. Regular physical activity. Eating a healthy diet. Avoiding tobacco and drug use. Limiting alcohol use. Practicing safe sex. Taking low-dose aspirin every day. Taking vitamin and mineral supplements as recommended by your health care provider. What happens during an annual  well check? The services and screenings done by your health care provider during your annual well check will depend on your age, overall health, lifestyle risk factors, and family history of disease. Counseling  Your health care provider may ask you questions about your: Alcohol use. Tobacco use. Drug use. Emotional well-being. Home and relationship well-being. Sexual activity. Eating habits. History of falls. Memory and ability to understand (cognition). Work and work Statistician. Reproductive health. Screening  You may have the following tests or measurements: Height, weight, and BMI. Blood pressure. Lipid and cholesterol levels. These may be checked every 5 years, or more frequently if you are over 17 years old. Skin check. Lung cancer screening. You may have this screening every year starting at age 16 if you have a 30-pack-year history of smoking and currently smoke or have quit within the past 15 years. Fecal occult blood test (FOBT) of the stool. You may have this test every year starting at age 27. Flexible sigmoidoscopy or colonoscopy. You may have a sigmoidoscopy every 5 years or a colonoscopy every 10 years starting at age 15. Hepatitis C blood test. Hepatitis B blood test. Sexually transmitted disease (STD) testing. Diabetes screening. This is done by checking your blood sugar (glucose) after you have not eaten for a while (fasting). You may have this done every 1-3 years. Bone density scan. This is done to screen for osteoporosis. You may have this done starting at age 75. Mammogram.  This may be done every 1-2 years. Talk to your health care provider about how often you should have regular mammograms. Talk with your health care provider about your test results, treatment options, and if necessary, the need for more tests. Vaccines  Your health care provider may recommend certain vaccines, such as: Influenza vaccine. This is recommended every year. Tetanus, diphtheria,  and acellular pertussis (Tdap, Td) vaccine. You may need a Td booster every 10 years. Zoster vaccine. You may need this after age 28. Pneumococcal 13-valent conjugate (PCV13) vaccine. One dose is recommended after age 13. Pneumococcal polysaccharide (PPSV23) vaccine. One dose is recommended after age 52. Talk to your health care provider about which screenings and vaccines you need and how often you need them. This information is not intended to replace advice given to you by your health care provider. Make sure you discuss any questions you have with your health care provider. Document Released: 11/25/2015 Document Revised: 07/18/2016 Document Reviewed: 08/30/2015 Elsevier Interactive Patient Education  2017 [employer] Prevention in the Home Falls can cause injuries. They can happen to people of all ages. There are many things you can do to make your home safe and to help prevent falls. What can I do on the outside of my home? Regularly fix the edges of walkways and driveways and fix any cracks. Remove anything that might make you trip as you walk through a door, such as a raised step or threshold. Trim any bushes or trees on the path to your home. Use bright outdoor lighting. Clear any walking paths of anything that might make someone trip, such as rocks or tools. Regularly check to see if handrails are loose or broken. Make sure that both sides of any steps have handrails. Any raised decks and porches should have guardrails on the edges. Have any leaves, snow, or ice cleared regularly. Use sand or salt on walking paths during winter. Clean up any spills in your garage right away. This includes oil or grease spills. What can I do in the bathroom? Use night lights. Install grab bars by the toilet and in the tub and shower. Do not use towel bars as grab bars. Use non-skid mats or decals in the tub or shower. If you need to sit down in the shower, use a plastic, non-slip  stool. Keep the floor dry. Clean up any water that spills on the floor as soon as it happens. Remove soap buildup in the tub or shower regularly. Attach bath mats securely with double-sided non-slip rug tape. Do not have throw rugs and other things on the floor that can make you trip. What can I do in the bedroom? Use night lights. Make sure that you have a light by your bed that is easy to reach. Do not use any sheets or blankets that are too big for your bed. They should not hang down onto the floor. Have a firm chair that has side arms. You can use this for support while you get dressed. Do not have throw rugs and other things on the floor that can make you trip. What can I do in the kitchen? Clean up any spills right away. Avoid walking on wet floors. Keep items that you use a lot in easy-to-reach places. If you need to reach something above you, use a strong step stool that has a grab bar. Keep electrical cords out of the way. Do not use floor polish or wax that makes floors slippery. If you  must use wax, use non-skid floor wax. Do not have throw rugs and other things on the floor that can make you trip. What can I do with my stairs? Do not leave any items on the stairs. Make sure that there are handrails on both sides of the stairs and use them. Fix handrails that are broken or loose. Make sure that handrails are as long as the stairways. Check any carpeting to make sure that it is firmly attached to the stairs. Fix any carpet that is loose or worn. Avoid having throw rugs at the top or bottom of the stairs. If you do have throw rugs, attach them to the floor with carpet tape. Make sure that you have a light switch at the top of the stairs and the bottom of the stairs. If you do not have them, ask someone to add them for you. What else can I do to help prevent falls? Wear shoes that: Do not have high heels. Have rubber bottoms. Are comfortable and fit you well. Are closed at the  toe. Do not wear sandals. If you use a stepladder: Make sure that it is fully opened. Do not climb a closed stepladder. Make sure that both sides of the stepladder are locked into place. Ask someone to hold it for you, if possible. Clearly mark and make sure that you can see: Any grab bars or handrails. First and last steps. Where the edge of each step is. Use tools that help you move around (mobility aids) if they are needed. These include: Canes. Walkers. Scooters. Crutches. Turn on the lights when you go into a dark area. Replace any light bulbs as soon as they burn out. Set up your furniture so you have a clear path. Avoid moving your furniture around. If any of your floors are uneven, fix them. If there are any pets around you, be aware of where they are. Review your medicines with your doctor. Some medicines can make you feel dizzy. This can increase your chance of falling. Ask your doctor what other things that you can do to help prevent falls. This information is not intended to replace advice given to you by your health care provider. Make sure you discuss any questions you have with your health care provider. Document Released: 08/25/2009 Document Revised: 04/05/2016 Document Reviewed: 12/03/2014 Elsevier Interactive Patient Education  2017 [employer].

## 2022-11-01 NOTE — Progress Notes (Addendum)
Virtual Visit via Telephone Note  I connected with  Jane Mooney on 11/01/22 at 11:00 AM EST by telephone and verified that I am speaking with the correct person using two identifiers.  Location: Patient: Patient Provider: [place] Persons participating in the virtual visit: patient/Nurse Health Advisor   I discussed the limitations, risks, security and privacy concerns of performing an evaluation and management service by telephone and the availability of in person appointments. The patient expressed understanding and agreed to proceed.  Interactive audio and video telecommunications were attempted between this nurse and patient, however failed, due to patient having technical difficulties OR patient did not have access to video capability.  We continued and completed visit with audio only.  Some vital signs may be absent or patient reported.   Sheral Flow, LPN  Subjective:   Jane Mooney is a 71 y.o. female who presents for Medicare Annual (Subsequent) preventive examination.  Review of Systems     Cardiac Risk Factors include: advanced age (>86mn, >>98women);dyslipidemia;family history of premature cardiovascular disease;hypertension;obesity (BMI >30kg/m2)     Objective:    Today's Vitals   11/01/22 1102  Weight: 178 lb 12.8 oz (81.1 kg)  Height: '5\' 3"'$  (1.6 m)  PainSc: 4   PainLoc: Knee   Body mass index is 31.67 kg/m.     11/01/2022   11:05 AM 07/10/2022    4:17 PM 10/31/2021    8:45 AM 09/20/2020    2:43 PM 07/05/2020    5:52 AM 06/23/2020    9:06 AM 06/27/2015   10:36 AM  Advanced Directives  Does Patient Have a Medical Advance Directive? No No Yes Yes No Yes Yes  Type of Advance Directive   Living will Healthcare Power of [place] will   Does patient want to make changes to medical advance directive?   No - Patient declined No - Patient declined     Copy of [place] Chart?    No - copy  requested   Yes  Would patient like information on creating a medical advance directive? No - Patient declined No - Patient declined   No - Patient declined      Current Medications (verified) Outpatient Encounter Medications as of 11/01/2022  Medication Sig   acetaminophen (TYLENOL) 500 MG tablet Take 1,000 mg by mouth every 6 (six) hours as needed for moderate pain or headache.   amLODipine (NORVASC) 2.5 MG tablet Take 1 tablet (2.5 mg total) by mouth daily.   aspirin EC 81 MG tablet Take 1 tablet (81 mg total) by mouth daily.   capsicum (ZOSTRIX) 0.075 % topical cream APPLY 4 TIMES A DAY AS DIRECTED   cholecalciferol (VITAMIN D) 1000 units tablet Take 2 tablets (2,000 Units total) by mouth daily. (Patient taking differently: Take 1,000 Units by mouth daily.)   Cyanocobalamin (VITAMIN B-12) 1000 MCG SUBL Place 1 tablet (1,000 mcg total) under the tongue daily.   famotidine (PEPCID) 20 MG tablet Take 1 tablet (20 mg total) by mouth daily.   fish oil-omega-3 fatty acids 1000 MG capsule Take 1 g by mouth daily.   fluticasone (FLONASE) 50 MCG/ACT nasal spray Place 1 spray into both nostrils daily as needed for allergies.   hydrocortisone (ANUSOL-HC) 25 MG suppository Place 1 suppository (25 mg total) rectally 2 (two) times daily.   hydrOXYzine (ATARAX) 25 MG tablet TAKE 1-2 TABLETS AT NIGHT AS NEEDED FOR ITCHING. CAUTION AS THIS MEDICATION CAN MAKE YOU  DROWSY   ibuprofen (ADVIL) 600 MG tablet    loratadine (CLARITIN) 10 MG tablet Take 1 tablet by mouth daily.   LORazepam (ATIVAN) 1 MG tablet TAKE 1 TABLET BY MOUTH TWICE A DAY AS NEEDED FOR ANXIETY   losartan (COZAAR) 100 MG tablet Take 1 tablet by mouth daily.   rosuvastatin (CRESTOR) 5 MG tablet TAKE 1 TABLET (5 MG TOTAL) BY MOUTH DAILY.   telmisartan (MICARDIS) 80 MG tablet Take 1 tablet (80 mg total) by mouth daily.   triamcinolone ointment (KENALOG) 0.1 % Apply 1 Application topically 4 (four) times daily.   triamterene-hydrochlorothiazide  (MAXZIDE-25) 37.5-25 MG tablet Take 0.5 tablets by mouth daily.   venlafaxine XR (EFFEXOR-XR) 37.5 MG 24 hr capsule Take 1 capsule (37.5 mg total) by mouth daily with breakfast.   No facility-administered encounter medications on file as of 11/01/2022.    Allergies (verified) Citalopram, Mirtazapine, and Quinapril hcl   History: Past Medical History:  Diagnosis Date   Allergic rhinitis    Anemia    Anxiety 2010   Chronic kidney disease    renal insufficiency    Depression    DJD (degenerative joint disease)    Left knee   Heart murmur    as a child    Hemorrhoid    HTN (hypertension)    Hyperlipidemia    LBP (low back pain)    MVP (mitral valve prolapse)    PONV (postoperative nausea and vomiting)    Symptomatic PVCs    Urticaria    recurrent   Past Surgical History:  Procedure Laterality Date   ABDOMINAL HYSTERECTOMY  1984   HEMORRHOID SURGERY     KNEE ARTHROSCOPY WITH MEDIAL MENISECTOMY Right 05/21/2014   Procedure: RIGHT KNEE ARTHROSCOPY WITH DEBRIDEMENT/SHAVING, MEDIAL MENISECTOMY;  Surgeon: Renette Butters, MD;  Location: [facility];  Service: Orthopedics;  Laterality: Right;   KNEE SURGERY  03/2007   Left Knee Arthr- Dr Onnie Graham   right bunionectomy      ROTATOR CUFF REPAIR  2004   Left    TONSILLECTOMY     TOTAL KNEE ARTHROPLASTY Left 07/05/2020   Procedure: TOTAL KNEE ARTHROPLASTY;  Surgeon: Renette Butters, MD;  Location: [facility];  Service: Orthopedics;  Laterality: Left;   [place]     Family History  Problem Relation Age of Onset   Hypertension Mother    Breast cancer Maternal Grandmother    Diabetes Other    Hypertension Other    Breast cancer Other    Cancer Other        breast    Colon cancer Neg Hx    Esophageal cancer Neg Hx    Stomach cancer Neg Hx    Rectal cancer Neg Hx    Social History   Socioeconomic History   Marital status: Married    Spouse name: Not on file   Number of children: 2   Years of education: Not on  file   Highest education level: Not on file  Occupational History   Occupation: Retired Programmer, 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  Tobacco Use   Smoking status: Never   Smokeless tobacco: Never  Vaping Use   Vaping Use: Never used  Substance and Sexual Activity   Alcohol use: Not Currently   Drug use: No   Sexual activity: Yes  Other Topics Concern   Not on file  Social History Narrative   Not on file   Social Determinants of Health  Financial Resource Strain: Low Risk  (11/01/2022)   Overall Financial Resource Strain (CARDIA)    Difficulty of Paying Living Expenses: Not hard at all  Food Insecurity: No Food Insecurity (11/01/2022)   Hunger Vital Sign    Worried About Running Out of Food in the Last Year: Never true    Ran Out of Food in the Last Year: Never true  Transportation Needs: No Transportation Needs (11/01/2022)   PRAPARE - Hydrologist (Medical): No    Lack of Transportation (Non-Medical): No  Physical Activity: Sufficiently Active (11/01/2022)   Exercise Vital Sign    Days of Exercise per Week: 5 days    Minutes of Exercise per Session: 30 min  Stress: No Stress Concern Present (11/01/2022)   [place]    Feeling of Stress : Not at all  Social Connections: [place] (11/01/2022)   Social Connection and Isolation Panel [NHANES]    Frequency of Communication with Friends and Family: More than three times a week    Frequency of Social Gatherings with Friends and Family: Once a week    Attends Religious Services: More than 4 times per year    Active Member of [employer] or Organizations: Yes    Attends Music therapist: More than 4 times per year    Marital Status: Married    Tobacco Counseling Counseling given: Not Answered   Clinical Intake:  Pre-visit preparation completed: Yes  Pain : 0-10 Pain Score: 4  Pain Type: Chronic  pain Pain Location: Knee Pain Orientation: Right     BMI - recorded: 31.67 Nutritional Status: BMI > 30  Obese Nutritional Risks: None Diabetes: No  How often do you need to have someone help you when you read instructions, pamphlets, or other written materials from your doctor or pharmacy?: 1 - Never What is the last grade level you completed in school?: Associate's Degree  Diabetic? No  Interpreter Needed?: No  Information entered by :: Lisette Abu, LPN.   Activities of Daily Living    11/01/2022   11:10 AM  In your present state of health, do you have any difficulty performing the following activities:  Hearing? 0  Vision? 0  Difficulty concentrating or making decisions? 0  Walking or climbing stairs? 0  Dressing or bathing? 0  Doing errands, shopping? 0  Preparing Food and eating ? N  Using the Toilet? N  In the past six months, have you accidently leaked urine? N  Do you have problems with loss of bowel control? N  Managing your Medications? N  Managing your Finances? N  Housekeeping or managing your Housekeeping? N    Patient Care Team: Plotnikov, Evie Lacks, MD as PCP - General Olevia Perches, Lowella Bandy, MD (Inactive) (Gastroenterology) Lomax, Marny Lowenstein, MD (Inactive) as Surgeon (Obstetrics and Gynecology) Key, Nelia Shi, NP as Nurse Practitioner (Gynecology) Renette Butters, MD as Attending Physician (Orthopedic Surgery) Gregor Hams, MD as Consulting Physician (Sports Medicine) Elsie Saas, MD as Consulting Physician (Orthopedic Surgery) Clent Jacks, MD as Consulting Physician (Ophthalmology) Margaretha Seeds, MD as Consulting Physician (Pulmonary Disease)  Indicate any recent Medical Services you may have received from other than [facility] providers in the past year (date may be approximate).     Assessment:   This is a routine wellness examination for Jane Mooney.  Hearing/Vision screen Hearing Screening - Comments:: Denies hearing difficulties   Vision  Screening - Comments:: Wears rx glasses -  up to date with routine eye exams with Clent Jacks, MD.   Dietary issues and exercise activities discussed: Current Exercise Habits: Home exercise routine;Structured exercise class, Type of exercise: walking;treadmill;stretching;strength training/weights;exercise ball;calisthenics, Time (Minutes): 30, Frequency (Times/Week): 5, Weekly Exercise (Minutes/Week): 150, Intensity: Moderate, Exercise limited by: orthopedic condition(s)   Goals Addressed             This Visit's Progress    Weight < 160 lb (72.576 kg)   178 lb 12.8 oz (81.1 kg)    Continue with [employer]S program by exercising more. [employer] has great social support and calorie tracks. I want to get out more also.        Depression Screen    11/01/2022   11:09 AM 09/19/2022   11:43 AM 06/19/2022    1:31 PM 03/15/2022    1:21 PM 10/31/2021    8:49 AM 02/10/2021    8:50 AM 02/10/2021    8:24 AM  PHQ 2/9 Scores  PHQ - 2 Score 0 0 4 2 0 2 2  PHQ- 9 Score 0 0 '10 5  7   '$ Exception Documentation       Other- indicate reason in comment box    Fall Risk    11/01/2022   11:06 AM 09/19/2022   11:42 AM 06/19/2022    1:31 PM 03/15/2022    1:21 PM 10/31/2021    8:47 AM  Fall Risk   Falls in the past year? 1 1 0 0 0  Number falls in past yr: 0 0  0 0  Injury with Fall? 0 0  0 0  Risk for fall due to : History of fall(s);Impaired mobility;Impaired balance/gait History of fall(s);Impaired balance/gait;Impaired mobility   No Fall Risks  Follow up Falls evaluation completed;Falls prevention discussed    Falls prevention discussed    FALL RISK PREVENTION PERTAINING TO THE HOME:  Any stairs in or around the home? Yes  If so, are there any without handrails? No  Home free of loose throw rugs in walkways, pet beds, electrical cords, etc? Yes  Adequate lighting in your home to reduce risk of falls? Yes   ASSISTIVE DEVICES UTILIZED TO PREVENT FALLS:  Life alert? No  Use of a cane, walker or w/c? No   Grab bars in the bathroom? No  Shower chair or bench in shower? Yes  Elevated toilet seat or a handicapped toilet? Yes   TIMED UP AND CB:JSEGB Visit  Was the test performed? No .   Cognitive Function:    06/27/2015   10:45 AM  MMSE - Mini Mental State Exam  Not completed: Unable to complete        11/01/2022   11:10 AM 09/20/2020    2:45 PM  6CIT Screen  What Year? 0 points 0 points  What month? 0 points 0 points  What time? 0 points 0 points  Count back from 20 0 points 0 points  Months in reverse 0 points 0 points  Repeat phrase 0 points 0 points  Total Score 0 points 0 points    Immunizations Immunization History  Administered Date(s) Administered   COVID-19, mRNA, vaccine(Comirnaty)12 years and older 08/24/2022   Fluad Quad(high Dose 65+) 07/29/2019, 09/20/2020, 07/18/2021, 08/24/2022   Influenza Split 08/03/2011, 09/09/2012   Influenza Whole 07/05/2010   Influenza, High Dose Seasonal PF 08/17/2016, 07/23/2017, 09/19/2018   Influenza,inj,Quad PF,6+ Mos 07/10/2013, 07/28/2014, 09/29/2015   PFIZER Comirnaty(Gray Top)Covid-19 Tri-Sucrose Vaccine 03/29/2021   PFIZER(Purple Top)SARS-COV-2 Vaccination 12/26/2019, 01/18/2020, 08/27/2020  Pension scheme manager 49yr & up 08/01/2021   Pneumococcal Conjugate-13 03/31/2015   Pneumococcal Polysaccharide-23 11/15/2014   Tdap 07/02/2012   Zoster Recombinat (Shingrix) 12/07/2021, 02/02/2022   Zoster, Live 07/15/2012    TDAP status: Due, Education has been provided regarding the importance of this vaccine. Advised may receive this vaccine at local pharmacy or Health Dept. Aware to provide a copy of the vaccination record if obtained from local pharmacy or Health Dept. Verbalized acceptance and understanding.  Flu Vaccine status: Up to date  Pneumococcal vaccine status: Up to date  Covid-19 vaccine status: Completed vaccines  Qualifies for Shingles Vaccine? Yes   Zostavax completed Yes   Shingrix  Completed?: Yes  Screening Tests Health Maintenance  Topic Date Due   DTaP/Tdap/Td (2 - Td or Tdap) 07/02/2022   COVID-19 Vaccine (7 - 2023-24 season) 10/19/2022   Medicare Annual Wellness (AWV)  11/02/2023   COLONOSCOPY (Pts 45-465yrInsurance coverage will need to be confirmed)  12/24/2023   MAMMOGRAM  10/16/2024   Pneumonia Vaccine 6540Years old  Completed   INFLUENZA VACCINE  Completed   DEXA SCAN  Completed   Hepatitis C Screening  Completed   Zoster Vaccines- Shingrix  Completed   HPV VACCINES  Aged Out    Health Maintenance  Health Maintenance Due  Topic Date Due   DTaP/Tdap/Td (2 - Td or Tdap) 07/02/2022   COVID-19 Vaccine (7 - 2023-24 season) 10/19/2022    Colorectal cancer screening: Type of screening: Colonoscopy. Completed 12/23/2013. Repeat every 10 years  Mammogram status: Completed 10/16/2022. Repeat every year  Bone Density status: No longer recommended.  Lung Cancer Screening: (Low Dose CT Chest recommended if Age 73-80ears, 30 pack-year currently smoking OR have quit w/in 15years.) does not qualify.   Lung Cancer Screening Referral: no  Additional Screening:  Hepatitis C Screening: does qualify; Completed 09/20/2016  Vision Screening: Recommended annual ophthalmology exams for early detection of glaucoma and other disorders of the eye. Is the patient up to date with their annual eye exam?  Yes  Who is the provider or what is the name of the office in which the patient attends annual eye exams? RoClent JacksMD. If pt is not established with a provider, would they like to be referred to a provider to establish care? No .   Dental Screening: Recommended annual dental exams for proper oral hygiene  Community Resource Referral / Chronic Care Management: CRR required this visit?  No   CCM required this visit?  No      Plan:     I have personally reviewed and noted the following in the patient's chart:   Medical and social history Use of alcohol,  tobacco or illicit drugs  Current medications and supplements including opioid prescriptions. Patient is not currently taking opioid prescriptions. Functional ability and status Nutritional status Physical activity Advanced directives List of other physicians Hospitalizations, surgeries, and ER visits in previous 12 months Vitals Screenings to include cognitive, depression, and falls Referrals and appointments  In addition, I have reviewed and discussed with patient certain preventive protocols, quality metrics, and best practice recommendations. A written personalized care plan for preventive services as well as general preventive health recommendations were provided to patient.     ShSheral FlowLPN   [id number] Nurse Notes: N/A  Medical screening examination/treatment/procedure(s) were performed by non-physician practitioner and as supervising physician I was immediately available for consultation/collaboration.  I agree with above. AlLew DawesMD

## 2022-12-18 ENCOUNTER — Encounter (HOSPITAL_BASED_OUTPATIENT_CLINIC_OR_DEPARTMENT_OTHER): Payer: Self-pay | Admitting: Pulmonary Disease

## 2022-12-18 ENCOUNTER — Ambulatory Visit: Payer: Medicare HMO | Admitting: Internal Medicine

## 2022-12-18 ENCOUNTER — Ambulatory Visit (INDEPENDENT_AMBULATORY_CARE_PROVIDER_SITE_OTHER): Payer: Medicare HMO | Admitting: Pulmonary Disease

## 2022-12-18 VITALS — BP 124/76 | HR 53 | Ht 63.0 in | Wt 184.2 lb

## 2022-12-18 DIAGNOSIS — J984 Other disorders of lung: Secondary | ICD-10-CM

## 2022-12-18 DIAGNOSIS — R911 Solitary pulmonary nodule: Secondary | ICD-10-CM

## 2022-12-18 LAB — PULMONARY FUNCTION TEST
DL/VA % pred: 107 %
DL/VA: 4.47 ml/min/mmHg/L
DLCO cor % pred: 86 %
DLCO cor: 16.15 ml/min/mmHg
DLCO unc % pred: 86 %
DLCO unc: 16.15 ml/min/mmHg
FEF 25-75 Post: 2.81 L/sec
FEF 25-75 Pre: 2.66 L/sec
FEF2575-%Change-Post: 5 %
FEF2575-%Pred-Post: 165 %
FEF2575-%Pred-Pre: 156 %
FEV1-%Change-Post: 3 %
FEV1-%Pred-Post: 107 %
FEV1-%Pred-Pre: 103 %
FEV1-Post: 2.23 L
FEV1-Pre: 2.16 L
FEV1FVC-%Change-Post: 4 %
FEV1FVC-%Pred-Pre: 112 %
FEV6-%Change-Post: 0 %
FEV6-%Pred-Post: 95 %
FEV6-%Pred-Pre: 95 %
FEV6-Post: 2.52 L
FEV6-Pre: 2.5 L
FEV6FVC-%Pred-Post: 105 %
FEV6FVC-%Pred-Pre: 105 %
FVC-%Change-Post: -1 %
FVC-%Pred-Post: 91 %
FVC-%Pred-Pre: 92 %
FVC-Post: 2.52 L
FVC-Pre: 2.55 L
Post FEV1/FVC ratio: 89 %
Post FEV6/FVC ratio: 100 %
Pre FEV1/FVC ratio: 85 %
Pre FEV6/FVC Ratio: 100 %
RV % pred: 86 %
RV: 1.9 L
TLC % pred: 88 %
TLC: 4.36 L

## 2022-12-18 MED ORDER — GABAPENTIN 100 MG PO CAPS: ORAL_CAPSULE | ORAL | 1 refills | Status: DC

## 2022-12-18 NOTE — Progress Notes (Signed)
Subjective:   PATIENT ID: Jane Mooney GENDER: female DOB: 14-Feb-1951, MRN: [id number]   HPI  Chief Complaint  Patient presents with   Follow-up    PFT results   Reason for Visit: Follow-up lung nodule, chronic cough  [name] is a 72 year old female with allergic rhinitis, history of recurrent urticaria, hypertension and CKD who presents for follow-up.  Synopsis 09/26/21 For the last six months she gradually developed chronic cough that is unproductive. Denies preceding illness. Denies wheezing or shortness of breath. Occurs mostly at night which wakes her up with episodes that last for few minutes until she takes a ricola. No daytime cough. Drinks hot tea or water at bedtime. The cough is severe enough to cause her a sore throat later. She has been following Dr. Ernst Bowler in Allergy. Compliant with her current regimen with zyrtec. Still has some post-nasal drainage. No recent urticaria >1 year. Her PCP started on her pepcid 3-4 months ago.   10/17/21 She presents to review CT scan. She is anxious and was not able to sleep last night. She does report improvement in her cough and has not coughed in the last two days.  04/23/22 Since our last visit her nonproductive cough is present but not severe. Occasionally still takes cough syrup and compliant with her H2 blocker and antihistamines per Allergy. Had CT chest completed last week and awaiting discussion on results. Denies shortness of breath, wheezing, hemoptysis. No changes in appetite, weight. No night sweats.   12/18/22 Since our last visit she continues to have mild nonproductive cough that worsens when laying down at night. Does have nasal congestion. On pepcid nightly. Denies shortness of breath, cough or wheezing.  Social History: Never smoker No pets Retired Health and safety inspector exposures: None. Denies dust or industrial exposures.  Past Medical History:  Diagnosis Date   Allergic rhinitis    Anemia    Anxiety  2010   Chronic kidney disease    renal insufficiency    Depression    DJD (degenerative joint disease)    Left knee   Heart murmur    as a child    Hemorrhoid    HTN (hypertension)    Hyperlipidemia    LBP (low back pain)    MVP (mitral valve prolapse)    PONV (postoperative nausea and vomiting)    Symptomatic PVCs    Urticaria    recurrent     Family History  Problem Relation Age of Onset   Hypertension Mother    Breast cancer Maternal Grandmother    Diabetes Other    Hypertension Other    Breast cancer Other    Cancer Other        breast    Colon cancer Neg Hx    Esophageal cancer Neg Hx    Stomach cancer Neg Hx    Rectal cancer Neg Hx      Social History   Occupational History   Occupation: Retired Programmer, 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     amLODipine (NORVASC) 2.5 MG tablet Take 1 tablet (2.5 mg total) by mouth daily. 90 tablet 3   capsicum (ZOSTRIX) 0.075 % topical cream APPLY 4 TIMES A DAY AS DIRECTED     cholecalciferol (VITAMIN D) 1000 units tablet Take 2 tablets (2,000 Units total) by mouth daily. (Patient taking differently: Take 1,000 Units by mouth daily.) 100 tablet 11   Cyanocobalamin (VITAMIN B-12) 1000 MCG SUBL Place 1 tablet (1,000 mcg total) under the tongue daily. 100 tablet 3   famotidine (PEPCID) 20 MG tablet Take 1 tablet (20 mg total) by mouth daily. 90 tablet 2   fish oil-omega-3 fatty acids 1000 MG capsule Take 1  g by mouth daily.     fluticasone (FLONASE) 50 MCG/ACT nasal spray Place 1 spray into both nostrils daily as needed for allergies. 16 g 5   hydrocortisone (ANUSOL-HC) 25 MG suppository Place 1 suppository (25 mg total) rectally 2 (two) times daily. 20 suppository 1   hydrOXYzine (ATARAX) 25 MG tablet TAKE 1-2 TABLETS AT NIGHT AS NEEDED FOR ITCHING. CAUTION AS THIS MEDICATION CAN MAKE YOU DROWSY 180 tablet 2   ibuprofen (ADVIL) 600 MG tablet      loratadine (CLARITIN) 10 MG tablet Take 1 tablet by mouth daily.     LORazepam (ATIVAN) 1 MG tablet TAKE 1 TABLET BY MOUTH TWICE A DAY AS NEEDED FOR ANXIETY 60 tablet 3   losartan (COZAAR) 100 MG tablet Take 1 tablet by mouth daily.     rosuvastatin (CRESTOR) 5 MG tablet TAKE 1 TABLET (5 MG TOTAL) BY MOUTH DAILY. 30 tablet 11   telmisartan (MICARDIS) 80 MG tablet Take 1 tablet (80 mg total) by mouth daily. 90 tablet 3   triamcinolone ointment (KENALOG) 0.1 % Apply 1 Application topically 4 (four) times daily. 80 g 1   triamterene-hydrochlorothiazide (MAXZIDE-25) 37.5-25 MG tablet Take 0.5 tablets by mouth daily. 90 tablet 3   venlafaxine XR (EFFEXOR-XR) 37.5 MG 24 hr capsule Take 1 capsule (37.5 mg total) by mouth daily with breakfast. 90 capsule 3   No facility-administered medications prior to visit.    Review of Systems  Constitutional:  Negative for chills, diaphoresis, fever, malaise/fatigue and weight loss.  HENT:  Negative for congestion.   Respiratory:  Positive for cough. Negative for hemoptysis, sputum production, shortness of breath and wheezing.   Cardiovascular:  Negative for chest pain, palpitations and leg swelling.     Objective:   Vitals:   12/18/22 1127  BP: 124/76  Pulse: (!) 53  SpO2: 100%  Weight: 184 lb 3.2 oz (83.6 kg)  Height: '5\' 3"'$  (1.6 m)   SpO2: 100 % O2 Device: None (Room air)  Physical Exam: General: Well-appearing, no acute distress HENT: [place], AT Eyes: EOMI, no scleral icterus Respiratory: Clear to  auscultation bilaterally.  No crackles, wheezing or rales Cardiovascular: RRR, -M/R/G, no JVD Extremities:-Edema,-tenderness Neuro: AAO x4, CNII-XII grossly intact Psych: Normal mood, normal affect  Data Reviewed:  Imaging: CXR 05/02/2021-normal chest x-ray.  No infiltrate, effusion or edema.  No overt parenchymal abnormalities CT Chest HR 10/11/21 - No evidence of interstitial lung disease. RML nodule measuring 8 x 68m. No prior CT to compare. CT Chest 04/20/22 - Unchanged 8 mm RML nodule CT Chest 10/15/22 - Stable 8 mm RML nodule  PFT:  09/26/2021 FVC 2.49 (113%) FEV1 2.19 (129%) Ratio 76 TLC 74% DLCO 74% Interpretation: No obstructive defect.  Mild restrictive defect with mildly reduced gas exchange.  No significant bronchodilator response.  Normal F-V loops.  12/18/22 FVC 2.52 (91%) FEV1 2.23 (107%) Ratio 85  TLC 88% DLCO 86% Interpretation: Normal PFTs   Labs: CBC    Component Value Date/Time   WBC 5.5 09/19/2022 1224   RBC 4.61 09/19/2022 1224   HGB 12.0 09/19/2022 1224   HCT 37.1 09/19/2022 1224   PLT 190.0 09/19/2022 1224   MCV 80.4 09/19/2022 1224   MCH 26.3 06/23/2020 0922   MCHC 32.3 09/19/2022 1224   RDW 14.5 09/19/2022 1224   LYMPHSABS 2.4 09/19/2022 1224   MONOABS 0.7 09/19/2022 1224   EOSABS 0.2 09/19/2022 1224   BASOSABS 0.1 09/19/2022 1224   Absolute eos  08/30/2021-200 12/12/21 - 100    Assessment & Plan:   Discussion: 72 year old female never smoker with allergic rhinitis, hx of recurrent urticaria, HTN and CKD who presents for follow-up. Previously seen for chronic cough that remains unchanged. Discussed management for neurogenic cough since she is optimized on other therapies. PFTs today have normalized. CT Chest with stable lung nodule.  Incidental RML nodule 10m --Stable. Recommend CT Chest without contrast in 6 months to complete recommended two year surveillance for nodule stability (May - Nov 2024)  Mild restrictive defect with reduced DLCO -  resolved Normalized PFTs. Suspect this is erroneous or resolved defect CT with no evidence of ILD --No further PFTs needed  Chronic cough - persistent, stable --START gabapentin as instructed --CONTINUE pepcid --CONTINUE allergy regimen per Dr. GErnst Bowler Health Maintenance Immunization History  Administered Date(s) Administered   COVID-19, mRNA, vaccine(Comirnaty)12 years and older 08/24/2022   Fluad Quad(high Dose 65+) 07/29/2019, 09/20/2020, 07/18/2021, 08/24/2022   Influenza Split 08/03/2011, 09/09/2012   Influenza Whole 07/05/2010   Influenza, High Dose Seasonal PF 08/17/2016, 07/23/2017, 09/19/2018   Influenza,inj,Quad PF,6+ Mos 07/10/2013, 07/28/2014, 09/29/2015   PFIZER Comirnaty(Gray Top)Covid-19 Tri-Sucrose Vaccine 03/29/2021   PFIZER(Purple Top)SARS-COV-2 Vaccination 12/26/2019, 01/18/2020, 08/27/2020   Pfizer Covid-19 Vaccine Bivalent Booster 163yr& up 08/01/2021   Pneumococcal Conjugate-13 03/31/2015   Pneumococcal Polysaccharide-23 11/15/2014   Tdap 07/02/2012   Zoster Recombinat (Shingrix) 12/07/2021, 02/02/2022   Zoster, Live 07/15/2012   CT Lung Screen- not indicated. Never smoker  Orders Placed This Encounter  Procedures   CT Chest Wo Contrast    Standing Status:   Future    Standing Expiration Date:   12/19/2023    Scheduling Instructions:     Schedule CT in 6 months. Ensure patient has follow-up with me.    Order Specific Question:   Preferred imaging location?    Answer:   [facility]   Meds ordered this encounter  Medications   gabapentin (NEURONTIN) 100 MG capsule    Sig: Take 1 tablet every night for two weeks. Increase to 2 tablets if cough is still present    Dispense:  60 capsule    Refill:  1   Return in about 6 months (around 06/18/2023).  I have spent a total time of 35-minutes on the day of the appointment including chart review, data review, collecting history, coordinating care and discussing medical diagnosis and plan with the  patient/family. Past medical history, allergies, medications were reviewed. Pertinent imaging, labs and tests included in this note have been reviewed and interpreted independently by me.  Alyza Artiaga JaRodman PickleMD [facility] 12/18/2022 1:01 PM  Office Number [phone]

## 2022-12-18 NOTE — Patient Instructions (Signed)
Full PFT Performed Today. 

## 2022-12-18 NOTE — Patient Instructions (Signed)
Incidental RML nodule 68m --Stable. Recommend CT Chest without contrast in 6 months to complete recommended two year surveillance for nodule stability (May - Nov 2024)  Mild restrictive defect with reduced DLCO - resolved Normalized PFTs. Suspect this is erroneous or resolved defect CT with no evidence of ILD --No further PFTs needed  Chronic cough - persistent, stable --START gabapentin as instructed --CONTINUE pepcid --CONTINUE allergy regimen per Dr. GErnst Bowler Follow-up with me in 6 months

## 2022-12-18 NOTE — Progress Notes (Signed)
Full PFT Performed Today. 

## 2022-12-19 ENCOUNTER — Encounter: Payer: Self-pay | Admitting: Internal Medicine

## 2022-12-19 ENCOUNTER — Ambulatory Visit (INDEPENDENT_AMBULATORY_CARE_PROVIDER_SITE_OTHER): Payer: Medicare HMO | Admitting: Internal Medicine

## 2022-12-19 VITALS — BP 122/70 | HR 59 | Temp 99.3°F | Ht 60.0 in | Wt 179.0 lb

## 2022-12-19 DIAGNOSIS — E538 Deficiency of other specified B group vitamins: Secondary | ICD-10-CM | POA: Diagnosis not present

## 2022-12-19 DIAGNOSIS — I10 Essential (primary) hypertension: Secondary | ICD-10-CM | POA: Diagnosis not present

## 2022-12-19 DIAGNOSIS — R5382 Chronic fatigue, unspecified: Secondary | ICD-10-CM

## 2022-12-19 DIAGNOSIS — E785 Hyperlipidemia, unspecified: Secondary | ICD-10-CM | POA: Diagnosis not present

## 2022-12-19 DIAGNOSIS — R140 Abdominal distension (gaseous): Secondary | ICD-10-CM | POA: Insufficient documentation

## 2022-12-19 DIAGNOSIS — R5383 Other fatigue: Secondary | ICD-10-CM | POA: Insufficient documentation

## 2022-12-19 DIAGNOSIS — K219 Gastro-esophageal reflux disease without esophagitis: Secondary | ICD-10-CM | POA: Diagnosis not present

## 2022-12-19 DIAGNOSIS — F4321 Adjustment disorder with depressed mood: Secondary | ICD-10-CM

## 2022-12-19 LAB — COMPREHENSIVE METABOLIC PANEL
ALT: 13 U/L (ref 0–35)
AST: 17 U/L (ref 0–37)
Albumin: 4.5 g/dL (ref 3.5–5.2)
Alkaline Phosphatase: 79 U/L (ref 39–117)
BUN: 13 mg/dL (ref 6–23)
CO2: 27 mEq/L (ref 19–32)
Calcium: 9.4 mg/dL (ref 8.4–10.5)
Chloride: 103 mEq/L (ref 96–112)
Creatinine, Ser: 1.01 mg/dL (ref 0.40–1.20)
GFR: 55.25 mL/min — ABNORMAL LOW (ref >60.00)
Glucose, Bld: 100 mg/dL — ABNORMAL HIGH (ref 70–99)
Potassium: 4 mEq/L (ref 3.5–5.1)
Sodium: 139 mEq/L (ref 135–145)
Total Bilirubin: 0.5 mg/dL (ref 0.2–1.2)
Total Protein: 7.6 g/dL (ref 6.0–8.3)

## 2022-12-19 LAB — TSH: TSH: 2.74 u[IU]/mL (ref 0.35–5.50)

## 2022-12-19 MED ORDER — VENLAFAXINE HCL ER 75 MG PO CP24: 75.0000 mg | ORAL_CAPSULE | Freq: Every day | ORAL | 1 refills | Status: DC

## 2022-12-19 MED ORDER — METRONIDAZOLE 500 MG PO TABS: 500.0000 mg | ORAL_TABLET | Freq: Three times a day (TID) | ORAL | 0 refills | Status: DC

## 2022-12-19 MED ORDER — ALIGN 4 MG PO CAPS: 1.0000 | ORAL_CAPSULE | Freq: Every day | ORAL | 1 refills | Status: DC

## 2022-12-19 NOTE — Assessment & Plan Note (Signed)
Chronic Cont on Amlodipine, Losartan, Triamt-HCTZ 0.5 tab qd

## 2022-12-19 NOTE — Progress Notes (Signed)
Subjective:  Patient ID: Jane Mooney, female    DOB: May 05, 1951  Age: 72 y.o. MRN: [id number]  CC: No chief complaint on file.   HPI Jane Mooney presents for OA, HTN, GERD C/o fatigue, gas  Outpatient Medications Prior to Visit  Medication Sig Dispense Refill   acetaminophen (TYLENOL) 500 MG tablet Take 1,000 mg by mouth every 6 (six) hours as needed for moderate pain or headache.     amLODipine (NORVASC) 2.5 MG tablet Take 1 tablet (2.5 mg total) by mouth daily. 90 tablet 3   capsicum (ZOSTRIX) 0.075 % topical cream APPLY 4 TIMES A DAY AS DIRECTED     cholecalciferol (VITAMIN D) 1000 units tablet Take 2 tablets (2,000 Units total) by mouth daily. (Patient taking differently: Take 1,000 Units by mouth daily.) 100 tablet 11   Cyanocobalamin (VITAMIN B-12) 1000 MCG SUBL Place 1 tablet (1,000 mcg total) under the tongue daily. 100 tablet 3   famotidine (PEPCID) 20 MG tablet Take 1 tablet (20 mg total) by mouth daily. 90 tablet 2   fish oil-omega-3 fatty acids 1000 MG capsule Take 1 g by mouth daily.     fluticasone (FLONASE) 50 MCG/ACT nasal spray Place 1 spray into both nostrils daily as needed for allergies. 16 g 5   gabapentin (NEURONTIN) 100 MG capsule Take 1 tablet every night for two weeks. Increase to 2 tablets if cough is still present 60 capsule 1   hydrocortisone (ANUSOL-HC) 25 MG suppository Place 1 suppository (25 mg total) rectally 2 (two) times daily. 20 suppository 1   hydrOXYzine (ATARAX) 25 MG tablet TAKE 1-2 TABLETS AT NIGHT AS NEEDED FOR ITCHING. CAUTION AS THIS MEDICATION CAN MAKE YOU DROWSY 180 tablet 2   ibuprofen (ADVIL) 600 MG tablet      ketorolac (ACULAR) 0.5 % ophthalmic solution Place 1 drop into the left eye 4 (four) times daily.     loratadine (CLARITIN) 10 MG tablet Take 1 tablet by mouth daily.     LORazepam (ATIVAN) 1 MG tablet TAKE 1 TABLET BY MOUTH TWICE A DAY AS NEEDED FOR ANXIETY 60 tablet 3   losartan (COZAAR) 100 MG tablet Take 1 tablet by mouth  daily.     ofloxacin (OCUFLOX) 0.3 % ophthalmic solution Place 1 drop into the left eye 4 (four) times daily.     rosuvastatin (CRESTOR) 5 MG tablet TAKE 1 TABLET (5 MG TOTAL) BY MOUTH DAILY. 30 tablet 11   telmisartan (MICARDIS) 80 MG tablet Take 1 tablet (80 mg total) by mouth daily. 90 tablet 3   timolol (TIMOPTIC) 0.5 % ophthalmic solution Place 1 drop into both eyes daily.     triamcinolone ointment (KENALOG) 0.1 % Apply 1 Application topically 4 (four) times daily. 80 g 1   triamterene-hydrochlorothiazide (MAXZIDE-25) 37.5-25 MG tablet Take 0.5 tablets by mouth daily. 90 tablet 3   venlafaxine XR (EFFEXOR-XR) 37.5 MG 24 hr capsule Take 1 capsule (37.5 mg total) by mouth daily with breakfast. 90 capsule 3   No facility-administered medications prior to visit.    ROS: Review of Systems  Constitutional:  Positive for fatigue. Negative for activity change, appetite change, chills and unexpected weight change.  HENT:  Negative for congestion, mouth sores and sinus pressure.   Eyes:  Negative for visual disturbance.  Respiratory:  Negative for cough and chest tightness.   Gastrointestinal:  Negative for abdominal pain and nausea.  Genitourinary:  Negative for difficulty urinating, frequency and vaginal pain.  Musculoskeletal:  Negative for  back pain and gait problem.  Skin:  Negative for pallor and rash.  Neurological:  Negative for dizziness, tremors, weakness, numbness and headaches.  Psychiatric/Behavioral:  Negative for confusion and sleep disturbance.     Objective:  BP 122/70 (BP Location: Right Arm, Patient Position: Sitting, Cuff Size: Normal)   Pulse (!) 59   Temp 99.3 F (37.4 C) (Oral)   Ht 5' (1.524 m)   Wt 179 lb (81.2 kg)   SpO2 98%   BMI 34.96 kg/m   BP Readings from Last 3 Encounters:  12/19/22 122/70  12/18/22 124/76  09/19/22 122/72    Wt Readings from Last 3 Encounters:  12/19/22 179 lb (81.2 kg)  12/18/22 184 lb 3.2 oz (83.6 kg)  11/01/22 178 lb 12.8  oz (81.1 kg)    Physical Exam Constitutional:      General: She is not in acute distress.    Appearance: She is well-developed. She is obese.  HENT:     Head: Normocephalic.     Right Ear: External ear normal.     Left Ear: External ear normal.     Nose: Nose normal.  Eyes:     General:        Right eye: No discharge.        Left eye: No discharge.     Conjunctiva/sclera: Conjunctivae normal.     Pupils: Pupils are equal, round, and reactive to light.  Neck:     Thyroid: No thyromegaly.     Vascular: No JVD.     Trachea: No tracheal deviation.  Cardiovascular:     Rate and Rhythm: Normal rate and regular rhythm.     Heart sounds: Normal heart sounds.  Pulmonary:     Effort: No respiratory distress.     Breath sounds: No stridor. No wheezing.  Abdominal:     General: Bowel sounds are normal. There is no distension.     Palpations: Abdomen is soft. There is no mass.     Tenderness: There is no abdominal tenderness. There is no guarding or rebound.  Musculoskeletal:        General: No tenderness.     Cervical back: Normal range of motion and neck supple. No rigidity.  Lymphadenopathy:     Cervical: No cervical adenopathy.  Skin:    Findings: No erythema or rash.  Neurological:     Mental Status: She is oriented to person, place, and time.     Cranial Nerves: No cranial nerve deficit.     Motor: No abnormal muscle tone.     Coordination: Coordination normal.     Deep Tendon Reflexes: Reflexes normal.  Psychiatric:        Behavior: Behavior normal.        Thought Content: Thought content normal.        Judgment: Judgment normal.   R knee w/pain  Lab Results  Component Value Date   WBC 5.5 09/19/2022   HGB 12.0 09/19/2022   HCT 37.1 09/19/2022   PLT 190.0 09/19/2022   GLUCOSE 94 04/20/2022   CHOL 131 04/20/2022   TRIG 82.0 04/20/2022   HDL 51.70 04/20/2022   LDLDIRECT 153.6 07/06/2013   LDLCALC 63 04/20/2022   ALT 15 04/20/2022   AST 20 04/20/2022   NA 138  04/20/2022   K 4.3 04/20/2022   CL 101 04/20/2022   CREATININE 1.16 04/20/2022   BUN 18 04/20/2022   CO2 30 04/20/2022   TSH 2.07 08/30/2021   INR 1.0 06/23/2020  HGBA1C 5.8 09/19/2022    MM 3D SCREEN BREAST BILATERAL  Result Date: 10/17/2022 CLINICAL DATA:  Screening. EXAM: DIGITAL SCREENING BILATERAL MAMMOGRAM WITH TOMOSYNTHESIS AND CAD TECHNIQUE: Bilateral screening digital craniocaudal and mediolateral oblique mammograms were obtained. Bilateral screening digital breast tomosynthesis was performed. The images were evaluated with computer-aided detection. COMPARISON:  Previous exam(s). ACR Breast Density Category b: There are scattered areas of fibroglandular density. FINDINGS: There are no findings suspicious for malignancy. IMPRESSION: No mammographic evidence of malignancy. A result letter of this screening mammogram will be mailed directly to the patient. RECOMMENDATION: Screening mammogram in one year. (Code:SM-B-01Y) BI-RADS CATEGORY  1: Negative. Electronically Signed   By: Kristopher Oppenheim M.D.   On: 10/17/2022 10:19    Assessment & Plan:   Problem List Items Addressed This Visit       Cardiovascular and Mediastinum   Essential hypertension - Primary    Chronic Cont on Amlodipine, Losartan, Triamt-HCTZ 0.5 tab qd      Relevant Orders   Comprehensive metabolic panel     Digestive   GERD (gastroesophageal reflux disease)    On Pepcid      Relevant Medications   Probiotic Product (ALIGN) 4 MG CAPS     Other   Vitamin B 12 deficiency    On Vit B12      Situational depression    Will increase Effexor to 75 mg/d      Relevant Medications   venlafaxine XR (EFFEXOR XR) 75 MG 24 hr capsule   Meteorism    Worse Start Flagyl and Align      Fatigue    Increase Effexor daily dose Cont w/wt loss      Dyslipidemia    Pt has declined statins Cont w/wt loss      Relevant Orders   TSH      Meds ordered this encounter  Medications   venlafaxine XR  (EFFEXOR XR) 75 MG 24 hr capsule    Sig: Take 1 capsule (75 mg total) by mouth daily with breakfast.    Dispense:  90 capsule    Refill:  1   metroNIDAZOLE (FLAGYL) 500 MG tablet    Sig: Take 1 tablet (500 mg total) by mouth 3 (three) times daily.    Dispense:  30 tablet    Refill:  0   Probiotic Product (ALIGN) 4 MG CAPS    Sig: Take 1 capsule (4 mg total) by mouth daily.    Dispense:  30 capsule    Refill:  1      Follow-up: Return in about 3 months (around 03/19/2023) for a follow-up visit.  [name], MD

## 2022-12-19 NOTE — Assessment & Plan Note (Signed)
Worse Start Flagyl and Electronics engineer

## 2022-12-19 NOTE — Assessment & Plan Note (Signed)
On Vit B12 

## 2022-12-19 NOTE — Assessment & Plan Note (Signed)
Will increase Effexor to 75 mg/d

## 2022-12-19 NOTE — Assessment & Plan Note (Signed)
Increase Effexor daily dose Cont w/wt loss

## 2022-12-19 NOTE — Assessment & Plan Note (Signed)
On Pepcid 

## 2022-12-19 NOTE — Assessment & Plan Note (Signed)
Pt has declined statins Cont w/wt loss

## 2023-01-30 DIAGNOSIS — H43393 Other vitreous opacities, bilateral: Secondary | ICD-10-CM | POA: Diagnosis not present

## 2023-01-30 DIAGNOSIS — Z961 Presence of intraocular lens: Secondary | ICD-10-CM | POA: Diagnosis not present

## 2023-01-30 DIAGNOSIS — H04123 Dry eye syndrome of bilateral lacrimal glands: Secondary | ICD-10-CM | POA: Diagnosis not present

## 2023-02-10 HISTORY — PX: CATARACT EXTRACTION: SUR2

## 2023-03-11 ENCOUNTER — Encounter: Payer: Self-pay | Admitting: Family Medicine

## 2023-03-11 ENCOUNTER — Ambulatory Visit (INDEPENDENT_AMBULATORY_CARE_PROVIDER_SITE_OTHER): Payer: Medicare HMO | Admitting: Family Medicine

## 2023-03-11 VITALS — BP 122/70 | HR 74 | Temp 97.9°F | Resp 20 | Ht 63.0 in | Wt 185.0 lb

## 2023-03-11 DIAGNOSIS — K1121 Acute sialoadenitis: Secondary | ICD-10-CM

## 2023-03-11 MED ORDER — MELOXICAM 7.5 MG PO TABS: 7.5000 mg | ORAL_TABLET | Freq: Every day | ORAL | 0 refills | Status: DC

## 2023-03-11 NOTE — Progress Notes (Signed)
Assessment & Plan:  1. Acute parotitis Education provided on parotitis.  Encouraged application of heat, massage, sucking on hard candies, adequate hydration, and NSAID.  Advised to let us know if symptoms do not resolve. - meloxicam (MOBIC) 7.5 MG tablet; Take 1 tablet (7.5 mg total) by mouth daily.  Dispense: 10 tablet; Refill: 0   Follow up plan: No follow-ups on file.  Deliah Boston, MSN, APRN, FNP-C  Subjective:  HPI: Jane Mooney is a 72 y.o. female presenting on 03/11/2023 for left neck pain (With left ear pain - this started on Friday )  Patient reports left-sided neck and ear pain that started 3 days ago.  She does have chronic sneezing, runny nose, and cough due to her allergies; this is unchanged.  Denies fever and symptoms of her mouth.  She has been applying heat and taking Tylenol.   ROS: Negative unless specifically indicated above in HPI.   Relevant past medical history reviewed and updated as indicated.   Allergies and medications reviewed and updated.   Current Outpatient Medications:    acetaminophen (TYLENOL) 500 MG tablet, Take 1,000 mg by mouth every 6 (six) hours as needed for moderate pain or headache., Disp: , Rfl:    amLODipine (NORVASC) 2.5 MG tablet, Take 1 tablet (2.5 mg total) by mouth daily., Disp: 90 tablet, Rfl: 3   cholecalciferol (VITAMIN D) 1000 units tablet, Take 2 tablets (2,000 Units total) by mouth daily. (Patient taking differently: Take 1,000 Units by mouth daily.), Disp: 100 tablet, Rfl: 11   Cyanocobalamin (VITAMIN B-12) 1000 MCG SUBL, Place 1 tablet (1,000 mcg total) under the tongue daily., Disp: 100 tablet, Rfl: 3   famotidine (PEPCID) 20 MG tablet, Take 1 tablet (20 mg total) by mouth daily., Disp: 90 tablet, Rfl: 2   fish oil-omega-3 fatty acids 1000 MG capsule, Take 1 g by mouth daily., Disp: , Rfl:    fluticasone (FLONASE) 50 MCG/ACT nasal spray, Place 1 spray into both nostrils daily as needed for allergies., Disp: 16 g, Rfl: 5    hydrOXYzine (ATARAX) 25 MG tablet, TAKE 1-2 TABLETS AT NIGHT AS NEEDED FOR ITCHING. CAUTION AS THIS MEDICATION CAN MAKE YOU DROWSY, Disp: 180 tablet, Rfl: 2   ibuprofen (ADVIL) 600 MG tablet, , Disp: , Rfl:    loratadine (CLARITIN) 10 MG tablet, Take 1 tablet by mouth daily., Disp: , Rfl:    LORazepam (ATIVAN) 1 MG tablet, TAKE 1 TABLET BY MOUTH TWICE A DAY AS NEEDED FOR ANXIETY, Disp: 60 tablet, Rfl: 3   losartan (COZAAR) 100 MG tablet, Take 1 tablet by mouth daily., Disp: , Rfl:    Probiotic Product (ALIGN) 4 MG CAPS, Take 1 capsule (4 mg total) by mouth daily., Disp: 30 capsule, Rfl: 1   rosuvastatin (CRESTOR) 5 MG tablet, TAKE 1 TABLET (5 MG TOTAL) BY MOUTH DAILY., Disp: 30 tablet, Rfl: 11   telmisartan (MICARDIS) 80 MG tablet, Take 1 tablet (80 mg total) by mouth daily., Disp: 90 tablet, Rfl: 3   triamcinolone ointment (KENALOG) 0.1 %, Apply 1 Application topically 4 (four) times daily., Disp: 80 g, Rfl: 1   triamterene-hydrochlorothiazide (MAXZIDE-25) 37.5-25 MG tablet, Take 0.5 tablets by mouth daily., Disp: 90 tablet, Rfl: 3   venlafaxine XR (EFFEXOR XR) 75 MG 24 hr capsule, Take 1 capsule (75 mg total) by mouth daily with breakfast. (Patient taking differently: Take 37.5 mg by mouth daily with breakfast.), Disp: 90 capsule, Rfl: 1  Allergies  Allergen Reactions   Citalopram  weak   Mirtazapine     Made pt very groggy   Quinapril Hcl Cough    Objective:   BP 122/70   Pulse 74   Temp 97.9 F (36.6 C)   Resp 20   Ht 5\' 3"  (1.6 m)   Wt 185 lb (83.9 kg)   BMI 32.77 kg/m    Physical Exam Vitals reviewed.  Constitutional:      General: She is not in acute distress.    Appearance: Normal appearance. She is not ill-appearing, toxic-appearing or diaphoretic.  HENT:     Head: Normocephalic and atraumatic.     Mouth/Throat:     Lips: Pink.     Mouth: Mucous membranes are moist.     Comments: Enlarged and tender parotid gland on the left side. Eyes:     General: No scleral  icterus.       Right eye: No discharge.        Left eye: No discharge.     Conjunctiva/sclera: Conjunctivae normal.  Cardiovascular:     Rate and Rhythm: Normal rate.  Pulmonary:     Effort: Pulmonary effort is normal. No respiratory distress.  Musculoskeletal:        General: Normal range of motion.     Cervical back: Normal range of motion.  Skin:    General: Skin is warm and dry.     Capillary Refill: Capillary refill takes less than 2 seconds.  Neurological:     General: No focal deficit present.     Mental Status: She is alert and oriented to person, place, and time. Mental status is at baseline.  Psychiatric:        Mood and Affect: Mood normal.        Behavior: Behavior normal.        Thought Content: Thought content normal.        Judgment: Judgment normal.

## 2023-03-19 ENCOUNTER — Ambulatory Visit (INDEPENDENT_AMBULATORY_CARE_PROVIDER_SITE_OTHER): Payer: Medicare HMO | Admitting: Internal Medicine

## 2023-03-19 ENCOUNTER — Encounter: Payer: Self-pay | Admitting: Internal Medicine

## 2023-03-19 VITALS — BP 112/70 | HR 61 | Temp 98.7°F | Ht 63.0 in | Wt 185.0 lb

## 2023-03-19 DIAGNOSIS — Z6832 Body mass index (BMI) 32.0-32.9, adult: Secondary | ICD-10-CM | POA: Diagnosis not present

## 2023-03-19 DIAGNOSIS — I2583 Coronary atherosclerosis due to lipid rich plaque: Secondary | ICD-10-CM | POA: Diagnosis not present

## 2023-03-19 DIAGNOSIS — E6609 Other obesity due to excess calories: Secondary | ICD-10-CM | POA: Diagnosis not present

## 2023-03-19 DIAGNOSIS — N183 Chronic kidney disease, stage 3 unspecified: Secondary | ICD-10-CM

## 2023-03-19 DIAGNOSIS — R7309 Other abnormal glucose: Secondary | ICD-10-CM

## 2023-03-19 DIAGNOSIS — R5382 Chronic fatigue, unspecified: Secondary | ICD-10-CM | POA: Diagnosis not present

## 2023-03-19 DIAGNOSIS — I2510 Atherosclerotic heart disease of native coronary artery without angina pectoris: Secondary | ICD-10-CM | POA: Diagnosis not present

## 2023-03-19 DIAGNOSIS — E538 Deficiency of other specified B group vitamins: Secondary | ICD-10-CM | POA: Diagnosis not present

## 2023-03-19 NOTE — Assessment & Plan Note (Signed)
On B12 

## 2023-03-19 NOTE — Assessment & Plan Note (Signed)
On Crestor 

## 2023-03-19 NOTE — Progress Notes (Signed)
Subjective:  Patient ID: Jane Mooney, female    DOB: Dec 16, 1950  Age: 72 y.o. MRN: [id number]  CC: No chief complaint on file.   HPI Jane Mooney presents for HTN, OA, fatigue  Outpatient Medications Prior to Visit  Medication Sig Dispense Refill   acetaminophen (TYLENOL) 500 MG tablet Take 1,000 mg by mouth every 6 (six) hours as needed for moderate pain or headache.     amLODipine (NORVASC) 2.5 MG tablet Take 1 tablet (2.5 mg total) by mouth daily. 90 tablet 3   cholecalciferol (VITAMIN D) 1000 units tablet Take 2 tablets (2,000 Units total) by mouth daily. (Patient taking differently: Take 1,000 Units by mouth daily.) 100 tablet 11   Cyanocobalamin (VITAMIN B-12) 1000 MCG SUBL Place 1 tablet (1,000 mcg total) under the tongue daily. 100 tablet 3   famotidine (PEPCID) 20 MG tablet Take 1 tablet (20 mg total) by mouth daily. 90 tablet 2   fish oil-omega-3 fatty acids 1000 MG capsule Take 1 g by mouth daily.     fluticasone (FLONASE) 50 MCG/ACT nasal spray Place 1 spray into both nostrils daily as needed for allergies. 16 g 5   hydrOXYzine (ATARAX) 25 MG tablet TAKE 1-2 TABLETS AT NIGHT AS NEEDED FOR ITCHING. CAUTION AS THIS MEDICATION CAN MAKE YOU DROWSY 180 tablet 2   ibuprofen (ADVIL) 600 MG tablet      loratadine (CLARITIN) 10 MG tablet Take 1 tablet by mouth daily.     LORazepam (ATIVAN) 1 MG tablet TAKE 1 TABLET BY MOUTH TWICE A DAY AS NEEDED FOR ANXIETY 60 tablet 3   losartan (COZAAR) 100 MG tablet Take 1 tablet by mouth daily.     meloxicam (MOBIC) 7.5 MG tablet Take 1 tablet (7.5 mg total) by mouth daily. 10 tablet 0   Probiotic Product (ALIGN) 4 MG CAPS Take 1 capsule (4 mg total) by mouth daily. 30 capsule 1   rosuvastatin (CRESTOR) 5 MG tablet TAKE 1 TABLET (5 MG TOTAL) BY MOUTH DAILY. 30 tablet 11   telmisartan (MICARDIS) 80 MG tablet Take 1 tablet (80 mg total) by mouth daily. 90 tablet 3   triamcinolone ointment (KENALOG) 0.1 % Apply 1 Application topically 4 (four)  times daily. 80 g 1   triamterene-hydrochlorothiazide (MAXZIDE-25) 37.5-25 MG tablet Take 0.5 tablets by mouth daily. 90 tablet 3   venlafaxine XR (EFFEXOR XR) 75 MG 24 hr capsule Take 1 capsule (75 mg total) by mouth daily with breakfast. (Patient taking differently: Take 37.5 mg by mouth daily with breakfast.) 90 capsule 1   No facility-administered medications prior to visit.    ROS: Review of Systems  Constitutional:  Negative for activity change, appetite change, chills, fatigue and unexpected weight change.  HENT:  Negative for congestion, mouth sores and sinus pressure.   Eyes:  Negative for visual disturbance.  Respiratory:  Negative for cough and chest tightness.   Gastrointestinal:  Negative for abdominal pain and nausea.  Genitourinary:  Negative for difficulty urinating, frequency and vaginal pain.  Musculoskeletal:  Positive for arthralgias, back pain and gait problem.  Skin:  Negative for pallor and rash.  Neurological:  Negative for dizziness, tremors, weakness, numbness and headaches.  Psychiatric/Behavioral:  Negative for confusion, sleep disturbance and suicidal ideas. The patient is not nervous/anxious.     Objective:  BP 112/70 (BP Location: Left Arm, Patient Position: Sitting, Cuff Size: Normal)   Pulse 61   Temp 98.7 F (37.1 C) (Oral)   Ht 5\' 3"  (1.6 m)  Wt 185 lb (83.9 kg)   SpO2 99%   BMI 32.77 kg/m   BP Readings from Last 3 Encounters:  03/19/23 112/70  03/11/23 122/70  12/19/22 122/70    Wt Readings from Last 3 Encounters:  03/19/23 185 lb (83.9 kg)  03/11/23 185 lb (83.9 kg)  12/19/22 179 lb (81.2 kg)    Physical Exam Constitutional:      General: She is not in acute distress.    Appearance: She is well-developed.  HENT:     Head: Normocephalic.     Right Ear: External ear normal.     Left Ear: External ear normal.     Nose: Nose normal.  Eyes:     General:        Right eye: No discharge.        Left eye: No discharge.      Conjunctiva/sclera: Conjunctivae normal.     Pupils: Pupils are equal, round, and reactive to light.  Neck:     Thyroid: No thyromegaly.     Vascular: No JVD.     Trachea: No tracheal deviation.  Cardiovascular:     Rate and Rhythm: Normal rate and regular rhythm.     Heart sounds: Normal heart sounds.  Pulmonary:     Effort: No respiratory distress.     Breath sounds: No stridor. No wheezing.  Abdominal:     General: Bowel sounds are normal. There is no distension.     Palpations: Abdomen is soft. There is no mass.     Tenderness: There is no abdominal tenderness. There is no guarding or rebound.  Musculoskeletal:        General: No tenderness.     Cervical back: Normal range of motion and neck supple. No rigidity.  Lymphadenopathy:     Cervical: No cervical adenopathy.  Skin:    Findings: No erythema or rash.  Neurological:     Mental Status: She is oriented to person, place, and time.     Cranial Nerves: No cranial nerve deficit.     Motor: No abnormal muscle tone.     Coordination: Coordination normal.     Deep Tendon Reflexes: Reflexes normal.  Psychiatric:        Behavior: Behavior normal.        Thought Content: Thought content normal.        Judgment: Judgment normal.   R knee w/pain Antalgic gait  Lab Results  Component Value Date   WBC 5.5 09/19/2022   HGB 12.0 09/19/2022   HCT 37.1 09/19/2022   PLT 190.0 09/19/2022   GLUCOSE 100 (H) 12/19/2022   CHOL 131 04/20/2022   TRIG 82.0 04/20/2022   HDL 51.70 04/20/2022   LDLDIRECT 153.6 07/06/2013   LDLCALC 63 04/20/2022   ALT 13 12/19/2022   AST 17 12/19/2022   NA 139 12/19/2022   K 4.0 12/19/2022   CL 103 12/19/2022   CREATININE 1.01 12/19/2022   BUN 13 12/19/2022   CO2 27 12/19/2022   TSH 2.74 12/19/2022   INR 1.0 06/23/2020   HGBA1C 5.8 09/19/2022    MM 3D SCREEN BREAST BILATERAL  Result Date: 10/17/2022 CLINICAL DATA:  Screening. EXAM: DIGITAL SCREENING BILATERAL MAMMOGRAM WITH TOMOSYNTHESIS AND  CAD TECHNIQUE: Bilateral screening digital craniocaudal and mediolateral oblique mammograms were obtained. Bilateral screening digital breast tomosynthesis was performed. The images were evaluated with computer-aided detection. COMPARISON:  Previous exam(s). ACR Breast Density Category b: There are scattered areas of fibroglandular density. FINDINGS: There are no findings  suspicious for malignancy. IMPRESSION: No mammographic evidence of malignancy. A result letter of this screening mammogram will be mailed directly to the patient. RECOMMENDATION: Screening mammogram in one year. (Code:SM-B-01Y) BI-RADS CATEGORY  1: Negative. Electronically Signed   By: Sande Brothers M.D.   On: 10/17/2022 10:19    Assessment & Plan:   Problem List Items Addressed This Visit     HYPERGLYCEMIA    Re-check glucose      Vitamin B 12 deficiency    On B12      Obesity    Wt Readings from Last 3 Encounters:  03/19/23 185 lb (83.9 kg)  03/11/23 185 lb (83.9 kg)  12/19/22 179 lb (81.2 kg)  We discussed diet       CRI (chronic renal insufficiency), stage 3 (moderate) (HCC)    Monitor labs Avoid NSAIDs.  Hydrate well      Coronary atherosclerosis    On Crestor      Fatigue - Primary   Relevant Orders   Comprehensive metabolic panel      No orders of the defined types were placed in this encounter.     Follow-up: No follow-ups on file.  Sonda Primes, MD

## 2023-03-19 NOTE — Assessment & Plan Note (Signed)
Recheck glucose

## 2023-03-19 NOTE — Assessment & Plan Note (Signed)
Monitor labs Avoid NSAIDs.  Hydrate well 

## 2023-03-19 NOTE — Assessment & Plan Note (Signed)
Wt Readings from Last 3 Encounters:  03/19/23 185 lb (83.9 kg)  03/11/23 185 lb (83.9 kg)  12/19/22 179 lb (81.2 kg)  We discussed diet

## 2023-03-21 LAB — COMPREHENSIVE METABOLIC PANEL
ALT: 16 U/L (ref 0–35)
AST: 21 U/L (ref 0–37)
Albumin: 4.2 g/dL (ref 3.5–5.2)
Alkaline Phosphatase: 78 U/L (ref 39–117)
BUN: 16 mg/dL (ref 6–23)
CO2: 29 mEq/L (ref 19–32)
Calcium: 9.5 mg/dL (ref 8.4–10.5)
Chloride: 103 mEq/L (ref 96–112)
Creatinine, Ser: 1.09 mg/dL (ref 0.40–1.20)
GFR: 50.33 mL/min — ABNORMAL LOW (ref >60.00)
Glucose, Bld: 100 mg/dL — ABNORMAL HIGH (ref 70–99)
Potassium: 3.8 mEq/L (ref 3.5–5.1)
Sodium: 140 mEq/L (ref 135–145)
Total Bilirubin: 0.4 mg/dL (ref 0.2–1.2)
Total Protein: 7.4 g/dL (ref 6.0–8.3)

## 2023-03-22 ENCOUNTER — Other Ambulatory Visit: Payer: Self-pay | Admitting: Internal Medicine

## 2023-03-26 ENCOUNTER — Other Ambulatory Visit: Payer: Self-pay | Admitting: Internal Medicine

## 2023-05-24 ENCOUNTER — Telehealth (INDEPENDENT_AMBULATORY_CARE_PROVIDER_SITE_OTHER): Payer: Medicare HMO | Admitting: Nurse Practitioner

## 2023-05-24 ENCOUNTER — Encounter: Payer: Self-pay | Admitting: Nurse Practitioner

## 2023-05-24 VITALS — HR 68 | Temp 98.6°F

## 2023-05-24 DIAGNOSIS — J40 Bronchitis, not specified as acute or chronic: Secondary | ICD-10-CM | POA: Diagnosis not present

## 2023-05-24 MED ORDER — BENZONATATE 100 MG PO CAPS: 100.0000 mg | ORAL_CAPSULE | Freq: Two times a day (BID) | ORAL | 0 refills | Status: DC | PRN

## 2023-05-24 MED ORDER — ALBUTEROL SULFATE HFA 108 (90 BASE) MCG/ACT IN AERS: 2.0000 | INHALATION_SPRAY | Freq: Four times a day (QID) | RESPIRATORY_TRACT | 0 refills | Status: DC | PRN

## 2023-05-24 MED ORDER — AZITHROMYCIN 250 MG PO TABS: ORAL_TABLET | ORAL | 0 refills | Status: AC

## 2023-05-24 NOTE — Assessment & Plan Note (Signed)
Acute Possibly viral, recommend patient wait another 3-4 days to see if symptoms improve. If they worsen before then or do not improve she can start antibiotic (azithromycin 250mg  - 2 tabs on day 1, then 1 tab daily days 2-5) Management symptoms with as needed inhaler, robitussin, benzonatate Told to f/u if symptoms worsen or do not improve for in-person evaluation She reports understanding.

## 2023-05-24 NOTE — Progress Notes (Signed)
   Established Patient Office Visit  An audio/visual tele-health visit was completed today for this patient. I connected with  Jane Mooney on 05/24/23 utilizing audio/visual technology and verified that I am speaking with the correct person using two identifiers. The patient was located at their home, and I was located at the office of [facility] at [facility] during the encounter. I discussed the limitations of evaluation and management by telemedicine. The patient expressed understanding and agreed to proceed.     Subjective   Patient ID: Jane Mooney, female    DOB: October 21, 1951  Age: 72 y.o. MRN: [id number]  Chief Complaint  Patient presents with   Cough   Symptom onset 6 days ago. Has tested negative for covid at home. Has been using robitussin for cough suppression with mild improvement.  Denies fever, has cough that is sometimes productive sometimes dry.  No shortness of breath.     Review of Systems  Constitutional:  Positive for malaise/fatigue. Negative for chills and fever.  HENT:  Positive for sore throat.        (+) sneezing  Respiratory:  Positive for cough and sputum production. Negative for shortness of breath and wheezing.   Cardiovascular:  Positive for chest pain (only with coughing).  Neurological:  Negative for headaches.      Objective:     Pulse 68   Temp 98.6 F (37 C)    Physical Exam Comprehensive physical exam not completed today as office visit was conducted remotely.  Patient appears fairly well over video, she does cough a few times but does not appear to be in respiratory distress.  Patient was alert and oriented, and appeared to have appropriate judgment.   No results found for any visits on 05/24/23.    The 10-year ASCVD risk score (Arnett DK, et al., 2019) is: 7.6%    Assessment & Plan:   Problem List Items Addressed This Visit       Respiratory   Bronchitis - Primary    Acute Possibly viral, recommend patient wait  another 3-4 days to see if symptoms improve. If they worsen before then or do not improve she can start antibiotic (azithromycin 250mg  - 2 tabs on day 1, then 1 tab daily days 2-5) Management symptoms with as needed inhaler, robitussin, benzonatate Told to f/u if symptoms worsen or do not improve for in-person evaluation She reports understanding.      Relevant Medications   azithromycin (ZITHROMAX) 250 MG tablet   benzonatate (TESSALON) 100 MG capsule   albuterol (VENTOLIN HFA) 108 (90 Base) MCG/ACT inhaler    No follow-ups on file.    Elenore Paddy, NP

## 2023-06-03 ENCOUNTER — Ambulatory Visit (INDEPENDENT_AMBULATORY_CARE_PROVIDER_SITE_OTHER): Payer: Medicare HMO | Admitting: Internal Medicine

## 2023-06-03 ENCOUNTER — Ambulatory Visit (INDEPENDENT_AMBULATORY_CARE_PROVIDER_SITE_OTHER): Payer: Medicare HMO

## 2023-06-03 ENCOUNTER — Encounter: Payer: Self-pay | Admitting: Internal Medicine

## 2023-06-03 VITALS — BP 124/76 | HR 58 | Temp 98.6°F | Ht 63.0 in | Wt 183.0 lb

## 2023-06-03 DIAGNOSIS — R051 Acute cough: Secondary | ICD-10-CM | POA: Diagnosis not present

## 2023-06-03 DIAGNOSIS — R062 Wheezing: Secondary | ICD-10-CM | POA: Diagnosis not present

## 2023-06-03 DIAGNOSIS — R053 Chronic cough: Secondary | ICD-10-CM | POA: Diagnosis not present

## 2023-06-03 DIAGNOSIS — R7309 Other abnormal glucose: Secondary | ICD-10-CM | POA: Diagnosis not present

## 2023-06-03 DIAGNOSIS — I10 Essential (primary) hypertension: Secondary | ICD-10-CM | POA: Diagnosis not present

## 2023-06-03 MED ORDER — HYDROCODONE BIT-HOMATROP MBR 5-1.5 MG/5ML PO SOLN: 5.0000 mL | Freq: Four times a day (QID) | ORAL | 0 refills | Status: AC | PRN

## 2023-06-03 MED ORDER — LEVOFLOXACIN 500 MG PO TABS: 500.0000 mg | ORAL_TABLET | Freq: Every day | ORAL | 0 refills | Status: AC

## 2023-06-03 MED ORDER — PREDNISONE 10 MG PO TABS: ORAL_TABLET | ORAL | 0 refills | Status: DC

## 2023-06-03 NOTE — Progress Notes (Signed)
Patient ID: Jane Mooney, female   DOB: 06/17/1951, 72 y.o.   MRN: [id number]        Chief Complaint: follow up cough, wheezing, htn, hyperglycemia       HPI:  Jane Mooney is a 72 y.o. female Here with acute onset mild to mod 2-3 wks ST, HA, general weakness and malaise, with prod cough greenish sputum, but Pt denies chest pain, orthopnea, PND, increased LE swelling, palpitations, dizziness or syncope, but has had mild wheezing sob in the last few days.   Pt denies polydipsia, polyuria, or new focal neuro s/s.    Pt denies fever, wt loss, night sweats, loss of appetite, or other constitutional symptoms         Wt Readings from Last 3 Encounters:  06/03/23 183 lb (83 kg)  03/19/23 185 lb (83.9 kg)  03/11/23 185 lb (83.9 kg)   BP Readings from Last 3 Encounters:  06/03/23 124/76  03/19/23 112/70  03/11/23 122/70         Past Medical History:  Diagnosis Date   Allergic rhinitis    Anemia    Anxiety 2010   Chronic kidney disease    renal insufficiency    Depression    DJD (degenerative joint disease)    Left knee   Heart murmur    as a child    Hemorrhoid    HTN (hypertension)    Hyperlipidemia    LBP (low back pain)    MVP (mitral valve prolapse)    PONV (postoperative nausea and vomiting)    Symptomatic PVCs    Urticaria    recurrent   Past Surgical History:  Procedure Laterality Date   ABDOMINAL HYSTERECTOMY  1984   HEMORRHOID SURGERY     KNEE ARTHROSCOPY WITH MEDIAL MENISECTOMY Right 05/21/2014   Procedure: RIGHT KNEE ARTHROSCOPY WITH DEBRIDEMENT/SHAVING, MEDIAL MENISECTOMY;  Surgeon: Sheral Apley, MD;  Location: [facility];  Service: Orthopedics;  Laterality: Right;   KNEE SURGERY  03/2007   Left Knee Arthr- Dr Rennis Chris   right bunionectomy      ROTATOR CUFF REPAIR  2004   Left    TONSILLECTOMY     TOTAL KNEE ARTHROPLASTY Left 07/05/2020   Procedure: TOTAL KNEE ARTHROPLASTY;  Surgeon: Sheral Apley, MD;  Location: [facility];  Service:  Orthopedics;  Laterality: Left;   [place]      reports that she has never smoked. She has never used smokeless tobacco. She reports that she does not currently use alcohol. She reports that she does not use drugs. family history includes Breast cancer in her maternal grandmother and another family member; Cancer in an other family member; Diabetes in an other family member; Hypertension in her mother and another family member. Allergies  Allergen Reactions   Citalopram     weak   Mirtazapine     Made pt very groggy   Quinapril Hcl Cough   Current Outpatient Medications on File Prior to Visit  Medication Sig Dispense Refill   acetaminophen (TYLENOL) 500 MG tablet Take 1,000 mg by mouth every 6 (six) hours as needed for moderate pain or headache.     albuterol (VENTOLIN HFA) 108 (90 Base) MCG/ACT inhaler Inhale 2 puffs into the lungs every 6 (six) hours as needed for wheezing or shortness of breath. 8 g 0   amLODipine (NORVASC) 2.5 MG tablet Take 1 tablet (2.5 mg total) by mouth daily. 90 tablet 3   aspirin EC 81 MG tablet Take  81 mg by mouth daily. Swallow whole.     benzonatate (TESSALON) 100 MG capsule Take 1 capsule (100 mg total) by mouth 2 (two) times daily as needed for cough. 20 capsule 0   cholecalciferol (VITAMIN D) 1000 units tablet Take 2 tablets (2,000 Units total) by mouth daily. (Patient taking differently: Take 1,000 Units by mouth daily.) 100 tablet 11   Cyanocobalamin (VITAMIN B-12) 1000 MCG SUBL Place 1 tablet (1,000 mcg total) under the tongue daily. 100 tablet 3   famotidine (PEPCID) 20 MG tablet Take 1 tablet (20 mg total) by mouth daily. 90 tablet 2   fish oil-omega-3 fatty acids 1000 MG capsule Take 1 g by mouth daily.     fluticasone (FLONASE) 50 MCG/ACT nasal spray Place 1 spray into both nostrils daily as needed for allergies. 16 g 5   hydrOXYzine (ATARAX) 25 MG tablet TAKE 1-2 TABLETS AT NIGHT AS NEEDED FOR ITCHING. CAUTION AS THIS MEDICATION CAN MAKE YOU DROWSY 180  tablet 2   loratadine (CLARITIN) 10 MG tablet Take 1 tablet by mouth daily.     LORazepam (ATIVAN) 1 MG tablet TAKE 1 TABLET BY MOUTH TWICE A DAY AS NEEDED FOR ANXIETY 60 tablet 3   Probiotic Product (ALIGN) 4 MG CAPS Take 1 capsule (4 mg total) by mouth daily. 30 capsule 1   rosuvastatin (CRESTOR) 5 MG tablet TAKE 1 TABLET (5 MG TOTAL) BY MOUTH DAILY. 90 tablet 3   telmisartan (MICARDIS) 80 MG tablet Take 1 tablet (80 mg total) by mouth daily. 90 tablet 3   triamcinolone ointment (KENALOG) 0.1 % Apply 1 Application topically 4 (four) times daily. 80 g 1   triamterene-hydrochlorothiazide (MAXZIDE-25) 37.5-25 MG tablet Take 0.5 tablets by mouth daily. 90 tablet 3   venlafaxine XR (EFFEXOR XR) 75 MG 24 hr capsule Take 1 capsule (75 mg total) by mouth daily with breakfast. (Patient taking differently: Take 37.5 mg by mouth daily with breakfast.) 90 capsule 1   No current facility-administered medications on file prior to visit.        ROS:  All others reviewed and negative.  Objective        PE:  BP 124/76 (BP Location: Right Arm, Patient Position: Sitting, Cuff Size: Normal)   Pulse (!) 58   Temp 98.6 F (37 C) (Oral)   Ht 5\' 3"  (1.6 m)   Wt 183 lb (83 kg)   SpO2 99%   BMI 32.42 kg/m                 Constitutional: Pt appears in NAD               HENT: Head: NCAT.                Right Ear: External ear normal.                 Left Ear: External ear normal. Bilat tm's with mild erythema.  Max sinus areas non tender.  Pharynx with mild erythema, no exudate               Eyes: . Pupils are equal, round, and reactive to light. Conjunctivae and EOM are normal               Nose: without d/c or deformity               Neck: Neck supple. Gross normal ROM               Cardiovascular:  Normal rate and regular rhythm.                 Pulmonary/Chest: Effort normal and breath sounds decreased without rales but few bilat wheezing.                               Neurological: Pt is alert. At  baseline orientation, motor grossly intact               Skin: Skin is warm. No rashes, no other new lesions, LE edema - none               Psychiatric: Pt behavior is normal without agitation   Micro: none  Cardiac tracings I have personally interpreted today:  none  Pertinent Radiological findings (summarize): none   Lab Results  Component Value Date   WBC 5.5 09/19/2022   HGB 12.0 09/19/2022   HCT 37.1 09/19/2022   PLT 190.0 09/19/2022   GLUCOSE 100 (H) 03/21/2023   CHOL 131 04/20/2022   TRIG 82.0 04/20/2022   HDL 51.70 04/20/2022   LDLDIRECT 153.6 07/06/2013   LDLCALC 63 04/20/2022   ALT 16 03/21/2023   AST 21 03/21/2023   NA 140 03/21/2023   K 3.8 03/21/2023   CL 103 03/21/2023   CREATININE 1.09 03/21/2023   BUN 16 03/21/2023   CO2 29 03/21/2023   TSH 2.74 12/19/2022   INR 1.0 06/23/2020   HGBA1C 5.8 09/19/2022   Assessment/Plan:  Jane Mooney is a 72 y.o. Black or African American [2] female with  has a past medical history of Allergic rhinitis, Anemia, Anxiety (2010), Chronic kidney disease, Depression, DJD (degenerative joint disease), Heart murmur, Hemorrhoid, HTN (hypertension), Hyperlipidemia, LBP (low back pain), MVP (mitral valve prolapse), PONV (postoperative nausea and vomiting), Symptomatic PVCs, and Urticaria.  Cough Mild to mod, for antibx course levaquin 50 0qd, cough med prn, and cxr r/o pna,  to f/u any worsening symptoms or concerns  Essential hypertension BP Readings from Last 3 Encounters:  06/03/23 124/76  03/19/23 112/70  03/11/23 122/70   Stable, pt to continue medical treatment norvasc 2.5 every day, micardis 80 every day, maxide 25 qd   HYPERGLYCEMIA Lab Results  Component Value Date   HGBA1C 5.8 09/19/2022   Stable, pt to continue current medical treatment  - diet, wt control   Wheezing Mild to mod, for prednisone taper,, albuterol hfa prn,,  to f/u any worsening symptoms or concerns  Followup: Return if symptoms worsen or  fail to improve.  Oliver Barre, MD 06/03/2023 9:02 PM [facility]

## 2023-06-03 NOTE — Assessment & Plan Note (Signed)
Mild to mod, for antibx course levaquin 50 0qd, cough med prn, and cxr r/o pna,  to f/u any worsening symptoms or concerns

## 2023-06-03 NOTE — Assessment & Plan Note (Signed)
Mild to mod, for prednisone taper,, albuterol hfa prn,,  to f/u any worsening symptoms or concerns

## 2023-06-03 NOTE — Patient Instructions (Signed)
Please take all new medication as prescribed - the antibiotic, cough medicine and prednisone  Please continue all other medications as before, including the inhaler  Please have the pharmacy call with any other refills you may need.  Please continue your efforts at being more active, low cholesterol diet, and weight control.  Please keep your appointments with your specialists as you may have planned  Please go to the XRAY Department in the first floor for the x-ray testing  You will be contacted by phone if any changes need to be made immediately.  Otherwise, you will receive a letter about your results with an explanation, but please check with MyChart first.  Please remember to sign up for MyChart if you have not done so, as this will be important to you in the future with finding out test results, communicating by private email, and scheduling acute appointments online when needed.

## 2023-06-03 NOTE — Assessment & Plan Note (Signed)
Lab Results  Component Value Date   HGBA1C 5.8 09/19/2022   Stable, pt to continue current medical treatment  - diet, wt control

## 2023-06-03 NOTE — Assessment & Plan Note (Signed)
BP Readings from Last 3 Encounters:  06/03/23 124/76  03/19/23 112/70  03/11/23 122/70   Stable, pt to continue medical treatment norvasc 2.5 every day, micardis 80 every day, maxide 25 qd

## 2023-06-09 ENCOUNTER — Encounter: Payer: Self-pay | Admitting: Internal Medicine

## 2023-06-18 ENCOUNTER — Ambulatory Visit (HOSPITAL_BASED_OUTPATIENT_CLINIC_OR_DEPARTMENT_OTHER)
Admission: RE | Admit: 2023-06-18 | Discharge: 2023-06-18 | Disposition: A | Discharge status: Home / Self Care | Payer: Medicare HMO | Source: Ambulatory Visit | Attending: Pulmonary Disease | Admitting: Pulmonary Disease

## 2023-06-18 DIAGNOSIS — R911 Solitary pulmonary nodule: Secondary | ICD-10-CM | POA: Insufficient documentation

## 2023-06-18 DIAGNOSIS — I700 Atherosclerosis of aorta: Secondary | ICD-10-CM | POA: Diagnosis not present

## 2023-06-19 ENCOUNTER — Encounter: Payer: Self-pay | Admitting: Internal Medicine

## 2023-06-19 ENCOUNTER — Ambulatory Visit: Payer: Medicare HMO | Admitting: Internal Medicine

## 2023-06-19 VITALS — BP 110/60 | HR 80 | Temp 98.6°F | Ht 63.0 in | Wt 183.0 lb

## 2023-06-19 DIAGNOSIS — R42 Dizziness and giddiness: Secondary | ICD-10-CM

## 2023-06-19 DIAGNOSIS — I10 Essential (primary) hypertension: Secondary | ICD-10-CM

## 2023-06-19 DIAGNOSIS — J309 Allergic rhinitis, unspecified: Secondary | ICD-10-CM | POA: Diagnosis not present

## 2023-06-19 MED ORDER — IPRATROPIUM BROMIDE 0.03 % NA SOLN
2.0000 | Freq: Four times a day (QID) | NASAL | 3 refills | Status: DC | PRN
Start: 2023-06-19 — End: 2023-09-17

## 2023-06-19 MED ORDER — TELMISARTAN 80 MG PO TABS: 40.0000 mg | ORAL_TABLET | Freq: Every day | ORAL | Status: DC

## 2023-06-19 NOTE — Progress Notes (Signed)
Subjective:  Patient ID: Jane Mooney, female    DOB: 17-Mar-1951  Age: 72 y.o. MRN: [id number]  CC: Follow-up (3 MNTH F/U, Increased fatigue)   HPI DESIRA LITTON presents for URI on July 7th. Pt saw s Wallace Cullens, NP - took a Zpack. Pt saw Dr Jonny Ruiz on 7/22 - Rx Levaquin, steroids: better. COVID (-). C/o nasal congestion, clear d/c.... On Flonase Pt felt lightheaded once  Outpatient Medications Prior to Visit  Medication Sig Dispense Refill   acetaminophen (TYLENOL) 500 MG tablet Take 1,000 mg by mouth every 6 (six) hours as needed for moderate pain or headache.     albuterol (VENTOLIN HFA) 108 (90 Base) MCG/ACT inhaler Inhale 2 puffs into the lungs every 6 (six) hours as needed for wheezing or shortness of breath. 8 g 0   amLODipine (NORVASC) 2.5 MG tablet Take 1 tablet (2.5 mg total) by mouth daily. 90 tablet 3   aspirin EC 81 MG tablet Take 81 mg by mouth daily. Swallow whole.     benzonatate (TESSALON) 100 MG capsule Take 1 capsule (100 mg total) by mouth 2 (two) times daily as needed for cough. 20 capsule 0   cholecalciferol (VITAMIN D) 1000 units tablet Take 2 tablets (2,000 Units total) by mouth daily. (Patient taking differently: Take 1,000 Units by mouth daily.) 100 tablet 11   Cyanocobalamin (VITAMIN B-12) 1000 MCG SUBL Place 1 tablet (1,000 mcg total) under the tongue daily. 100 tablet 3   famotidine (PEPCID) 20 MG tablet Take 1 tablet (20 mg total) by mouth daily. 90 tablet 2   fish oil-omega-3 fatty acids 1000 MG capsule Take 1 g by mouth daily.     fluticasone (FLONASE) 50 MCG/ACT nasal spray Place 1 spray into both nostrils daily as needed for allergies. 16 g 5   hydrOXYzine (ATARAX) 25 MG tablet TAKE 1-2 TABLETS AT NIGHT AS NEEDED FOR ITCHING. CAUTION AS THIS MEDICATION CAN MAKE YOU DROWSY 180 tablet 2   loratadine (CLARITIN) 10 MG tablet Take 1 tablet by mouth daily.     LORazepam (ATIVAN) 1 MG tablet TAKE 1 TABLET BY MOUTH TWICE A DAY AS NEEDED FOR ANXIETY 60 tablet 3    predniSONE (DELTASONE) 10 MG tablet 3 tabs by mouth per day for 3 days,2tabs per day for 3 days,1tab per day for 3 days 18 tablet 0   Probiotic Product (ALIGN) 4 MG CAPS Take 1 capsule (4 mg total) by mouth daily. 30 capsule 1   rosuvastatin (CRESTOR) 5 MG tablet TAKE 1 TABLET (5 MG TOTAL) BY MOUTH DAILY. 90 tablet 3   triamcinolone ointment (KENALOG) 0.1 % Apply 1 Application topically 4 (four) times daily. 80 g 1   triamterene-hydrochlorothiazide (MAXZIDE-25) 37.5-25 MG tablet Take 0.5 tablets by mouth daily. 90 tablet 3   venlafaxine XR (EFFEXOR XR) 75 MG 24 hr capsule Take 1 capsule (75 mg total) by mouth daily with breakfast. (Patient taking differently: Take 37.5 mg by mouth daily with breakfast.) 90 capsule 1   telmisartan (MICARDIS) 80 MG tablet Take 1 tablet (80 mg total) by mouth daily. 90 tablet 3   No facility-administered medications prior to visit.    ROS: Review of Systems  Constitutional:  Positive for fatigue. Negative for activity change, appetite change, chills and unexpected weight change.  HENT:  Positive for postnasal drip and rhinorrhea. Negative for congestion, mouth sores and sinus pressure.   Eyes:  Negative for visual disturbance.  Respiratory:  Negative for cough and chest tightness.  Gastrointestinal:  Negative for abdominal pain and nausea.  Genitourinary:  Negative for difficulty urinating, frequency and vaginal pain.  Musculoskeletal:  Positive for arthralgias. Negative for back pain and gait problem.  Skin:  Negative for pallor and rash.  Neurological:  Negative for dizziness, tremors, weakness, numbness and headaches.  Psychiatric/Behavioral:  Negative for confusion and sleep disturbance.     Objective:  BP 110/60 (BP Location: Right Arm, Patient Position: Sitting, Cuff Size: Normal)   Pulse 80   Temp 98.6 F (37 C) (Oral)   Ht 5\' 3"  (1.6 m)   Wt 183 lb (83 kg)   SpO2 96%   BMI 32.42 kg/m   BP Readings from Last 3 Encounters:  06/19/23 110/60   06/03/23 124/76  03/19/23 112/70    Wt Readings from Last 3 Encounters:  06/19/23 183 lb (83 kg)  06/03/23 183 lb (83 kg)  03/19/23 185 lb (83.9 kg)    Physical Exam Constitutional:      General: She is not in acute distress.    Appearance: She is well-developed.  HENT:     Head: Normocephalic.     Right Ear: External ear normal.     Left Ear: External ear normal.     Nose: Nose normal.  Eyes:     General:        Right eye: No discharge.        Left eye: No discharge.     Conjunctiva/sclera: Conjunctivae normal.     Pupils: Pupils are equal, round, and reactive to light.  Neck:     Thyroid: No thyromegaly.     Vascular: No JVD.     Trachea: No tracheal deviation.  Cardiovascular:     Rate and Rhythm: Normal rate and regular rhythm.     Heart sounds: Normal heart sounds.  Pulmonary:     Effort: No respiratory distress.     Breath sounds: No stridor. No wheezing.  Abdominal:     General: Bowel sounds are normal. There is no distension.     Palpations: Abdomen is soft. There is no mass.     Tenderness: There is no abdominal tenderness. There is no guarding or rebound.  Musculoskeletal:        General: No tenderness.     Cervical back: Normal range of motion and neck supple. No rigidity.  Lymphadenopathy:     Cervical: No cervical adenopathy.  Skin:    Findings: No erythema or rash.  Neurological:     Cranial Nerves: No cranial nerve deficit.     Motor: No abnormal muscle tone.     Coordination: Coordination normal.     Gait: Gait abnormal.     Deep Tendon Reflexes: Reflexes normal.  Psychiatric:        Behavior: Behavior normal.        Thought Content: Thought content normal.        Judgment: Judgment normal.   Antalgic gait  Lab Results  Component Value Date   WBC 5.5 09/19/2022   HGB 12.0 09/19/2022   HCT 37.1 09/19/2022   PLT 190.0 09/19/2022   GLUCOSE 100 (H) 03/21/2023   CHOL 131 04/20/2022   TRIG 82.0 04/20/2022   HDL 51.70 04/20/2022    LDLDIRECT 153.6 07/06/2013   LDLCALC 63 04/20/2022   ALT 16 03/21/2023   AST 21 03/21/2023   NA 140 03/21/2023   K 3.8 03/21/2023   CL 103 03/21/2023   CREATININE 1.09 03/21/2023   BUN 16 03/21/2023  CO2 29 03/21/2023   TSH 2.74 12/19/2022   INR 1.0 06/23/2020   HGBA1C 5.8 09/19/2022    No results found.  Assessment & Plan:   Problem List Items Addressed This Visit     Essential hypertension    Reduced Micardis to 40 mg/day due to low BP      Relevant Medications   telmisartan (MICARDIS) 80 MG tablet   Rhinitis, allergic - Primary    Worse Cont Flonase Afrin bid <5 d Atrovent nasal spray Rx      VERTIGO    Reduced Micardis to 40 mg/day due to low BP         Meds ordered this encounter  Medications   ipratropium (ATROVENT) 0.03 % nasal spray    Sig: Place 2 sprays into both nostrils 4 (four) times daily as needed for rhinitis.    Dispense:  30 mL    Refill:  3   telmisartan (MICARDIS) 80 MG tablet    Sig: Take 0.5 tablets (40 mg total) by mouth daily.      Follow-up: Return in about 3 months (around 09/19/2023) for a follow-up visit.  Sonda Primes, MD

## 2023-06-19 NOTE — Assessment & Plan Note (Signed)
Worse Cont Flonase Afrin bid <5 d Atrovent nasal spray Rx

## 2023-06-19 NOTE — Assessment & Plan Note (Signed)
Reduced Micardis to 40 mg/day due to low BP

## 2023-06-19 NOTE — Patient Instructions (Signed)
Reduce Micardis to 40 mg/day due to low BP

## 2023-07-11 ENCOUNTER — Other Ambulatory Visit: Payer: Self-pay | Admitting: Internal Medicine

## 2023-07-24 ENCOUNTER — Telehealth: Payer: Self-pay | Admitting: Internal Medicine

## 2023-07-24 DIAGNOSIS — M7711 Lateral epicondylitis, right elbow: Secondary | ICD-10-CM | POA: Diagnosis not present

## 2023-07-24 DIAGNOSIS — M1711 Unilateral primary osteoarthritis, right knee: Secondary | ICD-10-CM | POA: Diagnosis not present

## 2023-07-24 NOTE — Telephone Encounter (Signed)
We have received [employer] Clearance Jane Mooney & Jane Mooney and it has been placed in the provdiers box.   Please fax to: [phone]

## 2023-07-31 DIAGNOSIS — M7711 Lateral epicondylitis, right elbow: Secondary | ICD-10-CM | POA: Diagnosis not present

## 2023-08-07 ENCOUNTER — Telehealth: Payer: Self-pay | Admitting: Pulmonary Disease

## 2023-08-07 NOTE — Telephone Encounter (Signed)
PT calling for appt. Nothing avail until Dec. She states she needed to be seen sooner to rev the CT scan w/ Dr. Everardo All and she is having knee surgery and did not want that to cause an issue with her FU. She is not sure what date the sx will be done. Can we double book. Did not want to see an NP.  [phone]

## 2023-08-11 ENCOUNTER — Other Ambulatory Visit: Payer: Self-pay | Admitting: Internal Medicine

## 2023-08-12 ENCOUNTER — Other Ambulatory Visit: Payer: Self-pay

## 2023-08-12 ENCOUNTER — Encounter (HOSPITAL_BASED_OUTPATIENT_CLINIC_OR_DEPARTMENT_OTHER): Payer: Self-pay | Admitting: Pulmonary Disease

## 2023-08-12 NOTE — Telephone Encounter (Signed)
Patient chart was reviewed. Her last CT scan in 06/2023 demonstrated stable RML lung nodule at 21 months of surveillance.  Message sent to patient and read by patient below: "Good news! Your right middle lobe lung nodule is stable compared to 10/11/21. That is a total of 21 months of stability. Guidelines recommend following nodules for 18-24 months so no further imaging is indicated until you like one more scan to complete the 24 month mark. We can discuss this further at your next visit with me in October or November."   Patient can wait to be seen in December since her lung nodule is stable and she can schedule at her convenience before or after her knee surgery. Please contact patient with the above message and schedule follow-up.  I also called patient briefly and left voicemail with above.

## 2023-08-14 ENCOUNTER — Other Ambulatory Visit: Payer: Self-pay

## 2023-08-15 NOTE — Telephone Encounter (Signed)
LM for PT to call us. Please read Dr.Ellison's message and set FU.

## 2023-08-15 NOTE — Telephone Encounter (Signed)
Patient called back. Note was read to patient and appointment has been scheduled.

## 2023-08-20 ENCOUNTER — Other Ambulatory Visit: Payer: Self-pay | Admitting: Internal Medicine

## 2023-08-20 ENCOUNTER — Other Ambulatory Visit (HOSPITAL_BASED_OUTPATIENT_CLINIC_OR_DEPARTMENT_OTHER): Payer: Self-pay

## 2023-08-20 MED ORDER — COVID-19 MRNA VAC-TRIS(PFIZER) 30 MCG/0.3ML IM SUSY
0.3000 mL | PREFILLED_SYRINGE | Freq: Once | INTRAMUSCULAR | 0 refills | Status: AC
  Filled 2023-08-20: qty 0.3, 1d supply, fill #0

## 2023-08-20 MED ORDER — INFLUENZA VAC A&B SURF ANT ADJ 0.5 ML IM SUSY
0.5000 mL | PREFILLED_SYRINGE | Freq: Once | INTRAMUSCULAR | 0 refills | Status: AC
  Filled 2023-08-20: qty 0.5, 1d supply, fill #0

## 2023-09-02 ENCOUNTER — Other Ambulatory Visit: Payer: Self-pay | Admitting: Internal Medicine

## 2023-09-02 DIAGNOSIS — Z1231 Encounter for screening mammogram for malignant neoplasm of breast: Secondary | ICD-10-CM

## 2023-09-05 ENCOUNTER — Ambulatory Visit: Payer: Medicare HMO | Admitting: Allergy & Immunology

## 2023-09-05 ENCOUNTER — Encounter: Payer: Self-pay | Admitting: Allergy & Immunology

## 2023-09-05 ENCOUNTER — Other Ambulatory Visit: Payer: Self-pay

## 2023-09-05 VITALS — BP 138/60 | HR 66 | Temp 98.2°F | Resp 18 | Ht 62.21 in | Wt 187.3 lb

## 2023-09-05 DIAGNOSIS — J302 Other seasonal allergic rhinitis: Secondary | ICD-10-CM

## 2023-09-05 DIAGNOSIS — L500 Allergic urticaria: Secondary | ICD-10-CM | POA: Diagnosis not present

## 2023-09-05 DIAGNOSIS — J3089 Other allergic rhinitis: Secondary | ICD-10-CM | POA: Diagnosis not present

## 2023-09-05 MED ORDER — HYDROXYZINE HCL 25 MG PO TABS: ORAL_TABLET | ORAL | 3 refills | Status: DC

## 2023-09-05 MED ORDER — FLUTICASONE PROPIONATE 50 MCG/ACT NA SUSP: 1.0000 | Freq: Every day | NASAL | 3 refills | Status: AC | PRN

## 2023-09-05 NOTE — Progress Notes (Signed)
FOLLOW UP  Date of Service/Encounter:  09/05/23   Assessment:   Seasonal and perennial allergic rhinitis (grasses, ragweed, weeds, trees, indoor molds, outdoor molds, dust mites and cat) - well controlled with medications alone   Allergic urticaria - decreasing antihistamines over time   Pulmonary nodules - monitored by Dr. Everardo All  Plan/Recommendations:   1. Seasonal and perennial allergic rhinitis (grasses, ragweed, weeds, trees, indoor molds, outdoor molds, dust mites and cat) - Continue with: Zyrtec (cetirizine) 10mg  once daily and Flonase (fluticasone) one spray per nostril daily   2. Allergic urticaria - Previous lab workup was fairly normal.  - Morning: Zyrtec (cetirizine) 10mg  (one tablet)   - Evening: hydroxyzine 1-2 tablets at night - You can change this dosing at home, decreasing the dose as needed or increasing the dosing as needed.   3. Return in about 1 year (around 09/04/2024).   Subjective:   Jane Mooney is a 72 y.o. female presenting today for follow up of  Chief Complaint  Patient presents with   Urticaria   Follow-up    Yearly follow up. Still itching some    Jane Mooney has a history of the following: Patient Active Problem List   Diagnosis Date Noted   Wheezing 06/03/2023   Bronchitis 05/24/2023   Fatigue 12/19/2022   Meteorism 12/19/2022   Edema 03/15/2022   Anserine bursitis 03/15/2022   Aortic atherosclerosis (HCC) 12/12/2021   Coronary atherosclerosis 12/12/2021   Incidental lung nodule, > 3mm and < 8mm 10/17/2021   Restrictive lung disease 09/27/2021   Diffusion capacity of lung (dl), decreased 16/08/9603   Ankle pain, right 09/05/2021   Statin declined 09/05/2021   Cough 05/02/2021   Goiter 05/02/2021   Right hip pain 02/10/2021   Achilles tendinitis 12/20/2020   Memory problem 12/20/2020   Pain in surgical scar 09/20/2020   Seasonal and perennial allergic rhinitis 03/31/2020   CRI (chronic renal insufficiency), stage 3  (moderate) (HCC) 09/26/2019   Abscess of face 06/13/2017   Constipation 02/22/2017   Chest pain, atypical 11/30/2016   GERD (gastroesophageal reflux disease) 11/30/2016   Menopause 10/23/2016   Abscess of finger, left 08/17/2016   Thoracic back pain 09/29/2015   Osteoarthritis of both knees 09/29/2015   Allergic urticaria 03/29/2014   Grief 03/22/2014   Well adult exam 07/02/2012   Obesity 03/06/2012   Cervical pain (neck) 12/28/2011   Neoplasm of uncertain behavior of skin 08/03/2011   Onychomycosis 08/03/2011   Vitamin B 12 deficiency 05/07/2011   Hemorrhoid 05/03/2011   Dyslipidemia 10/31/2010   TRIGGER FINGER 03/30/2010   WEIGHT LOSS 12/15/2009   LOW BACK PAIN 10/25/2009   HYPERGLYCEMIA 10/25/2009   Vitamin D deficiency 02/24/2009   VERTIGO 04/09/2008   KNEE PAIN 03/30/2008   Rhinitis, allergic 02/13/2008   Pain in joint, shoulder region 02/13/2008   Anxiety state 09/30/2007   Situational depression 09/30/2007   EAR PAIN 09/30/2007   Essential hypertension 09/30/2007    History obtained from: chart review and patient.  Discussed the use of AI scribe software for clinical note transcription with the patient and/or guardian, who gave verbal consent to proceed.  Jane Mooney is a 72 y.o. female presenting for a follow up visit.  She was last seen in March 2023.  At that time, we continue with Zyrtec and Flonase which seem to be working well for her allergies.  For her hives, we continue with Zyrtec in the morning and hydroxyzine 1 to 2 tablets at night.   The  patient, with a history of allergies, has been managing symptoms with Zyrtec, Flonase, and Hydroxyzine. She reports that the regimen has been effective, but she has been without Hydroxyzine for approximately three weeks, during which she noticed a decline in symptom control.  In addition to allergies, the patient has experienced bronchitis since the last visit. She was treated with Albuterol, antibiotics, and Prednisone. She  denies any sinus infections during this period.  The patient also reports a runny nose, for which she was prescribed another nasal spray. She has also been using a steroid ointment for itching, but not on a daily basis.  She has received her annual flu shot and is planning to get a booster shot. She has also received the Prevnar 13 vaccine.   She continues to follow with Dr. Everardo All for her an incidental right pulmonary lung nodule. Chest CT has been stable. She continues to have a chronic cough, but it seems to be less severe than other visit. She was started on gabapentin and continued on Pepcid.   Otherwise, there have been no changes to her past medical history, surgical history, family history, or social history.    Review of systems otherwise negative other than that mentioned in the HPI.    Objective:   Blood pressure 138/60, pulse 66, temperature 98.2 F (36.8 C), temperature source Temporal, resp. rate 18, height 5' 2.21" (1.58 m), weight 187 lb 4.8 oz (85 kg), SpO2 100%. Body mass index is 34.03 kg/m.    Physical Exam Vitals reviewed.  Constitutional:      Appearance: She is well-developed.     Comments: Pleasant talkative female. Cooperative with the exam.   HENT:     Head: Normocephalic and atraumatic.     Right Ear: Tympanic membrane, ear canal and external ear normal.     Left Ear: Tympanic membrane, ear canal and external ear normal.     Nose: No nasal deformity, septal deviation, mucosal edema or rhinorrhea.     Right Turbinates: Enlarged, swollen and pale.     Left Turbinates: Enlarged, swollen and pale.     Right Sinus: No maxillary sinus tenderness or frontal sinus tenderness.     Left Sinus: No maxillary sinus tenderness or frontal sinus tenderness.     Mouth/Throat:     Mouth: Mucous membranes are not pale and not dry.     Pharynx: Uvula midline.  Eyes:     General:        Right eye: No discharge.        Left eye: No discharge.      Conjunctiva/sclera: Conjunctivae normal.     Right eye: Right conjunctiva is not injected. No chemosis.    Left eye: Left conjunctiva is not injected. No chemosis.    Pupils: Pupils are equal, round, and reactive to light.  Cardiovascular:     Rate and Rhythm: Normal rate and regular rhythm.     Heart sounds: Normal heart sounds.  Pulmonary:     Effort: Pulmonary effort is normal. No tachypnea, accessory muscle usage or respiratory distress.     Breath sounds: Normal breath sounds. No wheezing, rhonchi or rales.  Chest:     Chest wall: No tenderness.  Lymphadenopathy:     Cervical: No cervical adenopathy.  Skin:    General: Skin is warm.     Capillary Refill: Capillary refill takes less than 2 seconds.     Coloration: Skin is not pale.     Findings: No abrasion, erythema, petechiae  or rash. Rash is not papular, urticarial or vesicular.     Comments: No excoriations noted.  Neurological:     Mental Status: She is alert.  Psychiatric:        Behavior: Behavior is cooperative.      Diagnostic studies: none       Malachi Bonds, MD  [facility]

## 2023-09-05 NOTE — Patient Instructions (Addendum)
1. Seasonal and perennial allergic rhinitis (grasses, ragweed, weeds, trees, indoor molds, outdoor molds, dust mites and cat) - Continue with: Zyrtec (cetirizine) 10mg  once daily and Flonase (fluticasone) one spray per nostril daily   2. Allergic urticaria - Previous lab workup was fairly normal.  - Morning: Zyrtec (cetirizine) 10mg  (one tablet)   - Evening: hydroxyzine 1-2 tablets at night - You can change this dosing at home, decreasing the dose as needed or increasing the dosing as needed.   3. Return in about 1 year (around 09/04/2024).   Please inform us of any Emergency Department visits, hospitalizations, or changes in symptoms. Call us before going to the ED for breathing or allergy symptoms since we might be able to fit you in for a sick visit. Feel free to contact us anytime with any questions, problems, or concerns.  It was a pleasure to see you again today!  Websites that have reliable patient information: 1. American Academy of Asthma, Allergy, and Immunology: www.aaaai.org 2. Food Allergy Research and Education (FARE): foodallergy.org 3. Mothers of Asthmatics: http://www.asthmacommunitynetwork.org 4. American College of Allergy, Asthma, and Immunology: www.acaai.org   COVID-19 Vaccine Information can be found at: PodExchange.nl For questions related to vaccine distribution or appointments, please email vaccine@[email or web address] or call [phone].     "Like" [place] on Facebook and Instagram for our latest updates!       Make sure you are registered to vote! If you have moved or changed any of your contact information, you will need to get this updated before voting!  In some cases, you MAY be able to register to vote online: [email or web address]

## 2023-09-12 NOTE — Telephone Encounter (Signed)
nfn

## 2023-09-14 ENCOUNTER — Other Ambulatory Visit: Payer: Self-pay | Admitting: Internal Medicine

## 2023-09-19 ENCOUNTER — Telehealth: Payer: Self-pay

## 2023-09-19 ENCOUNTER — Ambulatory Visit: Payer: Medicare HMO

## 2023-09-19 VITALS — Ht 63.0 in | Wt 187.0 lb

## 2023-09-19 DIAGNOSIS — Z0000 Encounter for general adult medical examination without abnormal findings: Secondary | ICD-10-CM | POA: Diagnosis not present

## 2023-09-19 DIAGNOSIS — Z780 Asymptomatic menopausal state: Secondary | ICD-10-CM | POA: Diagnosis not present

## 2023-09-19 DIAGNOSIS — F339 Major depressive disorder, recurrent, unspecified: Secondary | ICD-10-CM | POA: Diagnosis not present

## 2023-09-19 NOTE — Patient Instructions (Addendum)
Jane Mooney , Thank you for taking time to come for your Medicare Wellness Visit. I appreciate your ongoing commitment to your health goals. Please review the following plan we discussed and let me know if I can assist you in the future.   Referrals/Orders/Follow-Ups/Clinician Recommendations: You are due for a Tetanus vaccine and also a Bone Density screening.  It was nice to talk with you today.  Each day, aim for 6 glasses of water, plenty of protein in your diet and try to get up and walk/ stretch every hour for 5-10 minutes at a time.  You have an order for:   [x]   Bone Density     Please call for appointment:  [facility] [address] [place], [place] [address] [phone]     Make sure to wear two-piece clothing.  No lotions, powders, or deodorants the day of the appointment. Make sure to bring picture ID and insurance card.  Bring list of medications you are currently taking including any supplements.   Schedule your [facility] screening mammogram through MyChart!   Log into your MyChart account.  Go to 'Visit' (or 'Appointments' if on mobile App) --> Schedule an Appointment  Under 'Select a Reason for Visit' choose the Mammogram Screening option.  Complete the pre-visit questions and select the time and place that best fits your schedule.    This is a list of the screening recommended for you and due dates:  Health Maintenance  Topic Date Due   Colon Cancer Screening  12/24/2023   DTaP/Tdap/Td vaccine (2 - Td or Tdap) 11/12/2023*   COVID-19 Vaccine (8 - 2023-24 season) 10/15/2023   Medicare Annual Wellness Visit  09/18/2024   Mammogram  10/16/2024   Pneumonia Vaccine  Completed   Flu Shot  Completed   DEXA scan (bone density measurement)  Completed   Hepatitis C Screening  Completed   Zoster (Shingles) Vaccine  Completed   HPV Vaccine  Aged Out  *Topic was postponed. The date shown is not the original due date.    Advanced  directives: (Copy Requested) Please bring a copy of your health care power of attorney and living will to the office to be added to your chart at your convenience.  Next Medicare Annual Wellness Visit scheduled for next year: Yes

## 2023-09-19 NOTE — Telephone Encounter (Signed)
This patient scored a 15 on her PHQ9 today and on her last visit she scored a 9.  I wanted to ask if you, do you think she should be referred to a psychiatrist to talk with?  She has an appointment coming up with you on Monday.

## 2023-09-19 NOTE — Progress Notes (Cosign Needed Addendum)
Subjective:   Jane Mooney is a 72 y.o. female who presents for Medicare Annual (Subsequent) preventive examination.  Visit Complete: Virtual I connected with  Jane Mooney on 09/19/23 by a audio enabled telemedicine application and verified that I am speaking with the correct person using two identifiers.  Patient Location: Home  Provider Location: Office/Clinic  I discussed the limitations of evaluation and management by telemedicine. The patient expressed understanding and agreed to proceed.  Vital Signs: Because this visit was a virtual/telehealth visit, some criteria may be missing or patient reported. Any vitals not documented were not able to be obtained and vitals that have been documented are patient reported.  Patient Medicare AWV questionnaire was completed by the patient on 09/15/2023; I have confirmed that all information answered by patient is correct and no changes since this date.  Cardiac Risk Factors include: advanced age (>7men, >68 women);hypertension;Other (see comment);dyslipidemia, Risk factor comments: Aortic atherosclerosis     Objective:    Today's Vitals   09/19/23 1115  Weight: 187 lb (84.8 kg)  Height: 5\' 3"  (1.6 m)   Body mass index is 33.13 kg/m.     09/19/2023   11:22 AM 11/01/2022   11:05 AM 07/10/2022    4:17 PM 10/31/2021    8:45 AM 09/20/2020    2:43 PM 07/05/2020    5:52 AM 06/23/2020    9:06 AM  Advanced Directives  Does Patient Have a Medical Advance Directive? No No No Yes Yes No Yes  Type of Advance Directive    Living will Healthcare Power of [employer] of [place];Living will  Does patient want to make changes to medical advance directive?    No - Patient declined No - Patient declined    Copy of Healthcare Power of Attorney in Chart?     No - copy requested    Would patient like information on creating a medical advance directive?  No - Patient declined No - Patient declined   No - Patient declined     Current  Medications (verified) Outpatient Encounter Medications as of 09/19/2023  Medication Sig   acetaminophen (TYLENOL) 500 MG tablet Take 1,000 mg by mouth every 6 (six) hours as needed for moderate pain or headache.   amLODipine (NORVASC) 2.5 MG tablet TAKE 1 TABLET BY MOUTH EVERY DAY   aspirin EC 81 MG tablet Take 81 mg by mouth daily. Swallow whole.   cholecalciferol (VITAMIN D) 1000 units tablet Take 2 tablets (2,000 Units total) by mouth daily. (Patient taking differently: Take 1,000 Units by mouth daily.)   Cyanocobalamin (VITAMIN B-12) 1000 MCG SUBL Place 1 tablet (1,000 mcg total) under the tongue daily.   famotidine (PEPCID) 20 MG tablet TAKE 1 TABLET BY MOUTH EVERY DAY   fish oil-omega-3 fatty acids 1000 MG capsule Take 1 g by mouth daily.   fluticasone (FLONASE) 50 MCG/ACT nasal spray Place 1 spray into both nostrils daily as needed for allergies.   hydrOXYzine (ATARAX) 25 MG tablet Take 1-2 tablets at night as needed for itching. Caution as this medication can make you drowsy   ipratropium (ATROVENT) 0.03 % nasal spray SPRAY 2 SPRAYS INTO EACH NOSTRIL 4 TIMES A DAY AS NEEDED FOR RHINITIS   loratadine (CLARITIN) 10 MG tablet Take 1 tablet by mouth daily.   LORazepam (ATIVAN) 1 MG tablet TAKE 1 TABLET BY MOUTH TWICE A DAY AS NEEDED FOR ANXIETY   Probiotic Product (ALIGN) 4 MG CAPS Take 1 capsule (4 mg total)  by mouth daily.   rosuvastatin (CRESTOR) 5 MG tablet TAKE 1 TABLET (5 MG TOTAL) BY MOUTH DAILY.   telmisartan (MICARDIS) 80 MG tablet TAKE 1 TABLET BY MOUTH EVERY DAY   triamcinolone ointment (KENALOG) 0.1 % APPLY 1 APPLICATION TOPICALLY 4 TIMES A DAY   triamterene-hydrochlorothiazide (MAXZIDE-25) 37.5-25 MG tablet Take 0.5 tablets by mouth daily.   venlafaxine XR (EFFEXOR-XR) 75 MG 24 hr capsule TAKE 1 CAPSULE BY MOUTH EVERY DAY WITH BREAKFAST   No facility-administered encounter medications on file as of 09/19/2023.    Allergies (verified) Citalopram, Mirtazapine, and Quinapril  hcl   History: Past Medical History:  Diagnosis Date   Allergic rhinitis    Anemia    Anxiety 2010   Chronic kidney disease    renal insufficiency    Depression    DJD (degenerative joint disease)    Left knee   Heart murmur    as a child    Hemorrhoid    HTN (hypertension)    Hyperlipidemia    LBP (low back pain)    MVP (mitral valve prolapse)    PONV (postoperative nausea and vomiting)    Symptomatic PVCs    Urticaria    recurrent   Past Surgical History:  Procedure Laterality Date   ABDOMINAL HYSTERECTOMY  1984   HEMORRHOID SURGERY     KNEE ARTHROSCOPY WITH MEDIAL MENISECTOMY Right 05/21/2014   Procedure: RIGHT KNEE ARTHROSCOPY WITH DEBRIDEMENT/SHAVING, MEDIAL MENISECTOMY;  Surgeon: Sheral Apley, MD;  Location: [facility];  Service: Orthopedics;  Laterality: Right;   KNEE SURGERY  03/2007   Left Knee Arthr- Dr Rennis Chris   right bunionectomy      ROTATOR CUFF REPAIR  2004   Left    TONSILLECTOMY     TOTAL KNEE ARTHROPLASTY Left 07/05/2020   Procedure: TOTAL KNEE ARTHROPLASTY;  Surgeon: Sheral Apley, MD;  Location: [facility];  Service: Orthopedics;  Laterality: Left;   [place]     Family History  Problem Relation Age of Onset   Hypertension Mother    Breast cancer Maternal Grandmother    Diabetes Other    Hypertension Other    Breast cancer Other    Cancer Other        breast    Colon cancer Neg Hx    Esophageal cancer Neg Hx    Stomach cancer Neg Hx    Rectal cancer Neg Hx    Social History   Socioeconomic History   Marital status: Married    Spouse name: Garment/textile technologist   Number of children: 2   Years of education: Not on file   Highest education level: Associate degree: occupational, Scientist, product/process development, or vocational program  Occupational History   Occupation: Retired Teacher, adult education:     Comment: 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   Social Determinants of Health   Financial Resource Strain: Low Risk  (09/15/2023)   Overall Financial Resource Strain (CARDIA)    Difficulty of Paying Living Expenses: Not hard at all  Food Insecurity: No Food Insecurity (09/15/2023)   Hunger Vital Sign    Worried About Running Out of Food in the Last Year:  Never true    Ran Out of Food in the Last Year: Never true  Transportation Needs: No Transportation Needs (09/15/2023)   PRAPARE - Administrator, Civil Service (Medical): No    Lack of Transportation (Non-Medical): No  Physical Activity: Insufficiently Active (09/15/2023)   Exercise Vital Sign    Days of Exercise per Week: 1 day    Minutes of Exercise per Session: 20 min  Stress: No Stress Concern Present (09/15/2023)   [employer] of Occupational Health - Occupational Stress Questionnaire    Feeling of Stress : Only a little  Social Connections: Socially Integrated (09/15/2023)   Social Connection and Isolation Panel [NHANES]    Frequency of Communication with Friends and Family: Twice a week    Frequency of Social Gatherings with Friends and Family: Once a week    Attends Religious Services: More than 4 times per year    Active Member of [employer] or Organizations: Yes    Attends Engineer, structural: More than 4 times per year    Marital Status: Married    Tobacco Counseling Counseling given: Not Answered   Clinical Intake:  Pre-visit preparation completed: Yes  Pain : No/denies pain     BMI - recorded: 33.13 Nutritional Status: BMI > 30  Obese Nutritional Risks: None Diabetes: No  How often do you need to have someone help you when you read instructions, pamphlets, or other written materials from your doctor or pharmacy?: 1 - Never  Interpreter Needed?: No  Information  entered by :: Leiyah Maultsby, RMA   Activities of Daily Living    09/15/2023    9:41 AM 11/01/2022   11:10 AM  In your present state of health, do you have any difficulty performing the following activities:  Hearing? 0 0  Vision? 0 0  Difficulty concentrating or making decisions? 1 0  Walking or climbing stairs? 1 0  Dressing or bathing? 0 0  Doing errands, shopping? 0 0  Preparing Food and eating ? N N  Using the Toilet? N N  In the past six months, have you accidently leaked urine? N N  Do you have problems with loss of bowel control? N N  Managing your Medications? N N  Managing your Finances? N N  Housekeeping or managing your Housekeeping? Y N    Patient Care Team: Plotnikov, Georgina Quint, MD as PCP - General Juanda Chance, Hedwig Morton, MD (Inactive) (Gastroenterology) Lomax, Billey Chang, MD (Inactive) as Surgeon (Obstetrics and Gynecology) Key, Verita Schneiders, NP as Nurse Practitioner (Gynecology) Sheral Apley, MD as Attending Physician (Orthopedic Surgery) Rodolph Bong, MD as Consulting Physician (Sports Medicine) Salvatore Marvel, MD as Consulting Physician (Orthopedic Surgery) Ernesto Rutherford, MD as Consulting Physician (Ophthalmology) Luciano Cutter, MD as Consulting Physician (Pulmonary Disease)  Indicate any recent Medical Services you may have received from other than [facility] providers in the past year (date may be approximate).     Assessment:   This is a routine wellness examination for [place].  Hearing/Vision screen Hearing Screening - Comments:: Denies hearing difficulties   Vision Screening - Comments:: Has cataract surgery/wears readers   Goals Addressed   None   Depression Screen    09/19/2023   11:26 AM 06/19/2023    9:30 AM 03/19/2023    9:17 AM 03/11/2023    3:12 PM 12/19/2022    8:29 AM 11/01/2022   11:09 AM 09/19/2022   11:43 AM  PHQ 2/9 Scores  PHQ - 2 Score 5  3 1 1  0 0 0  PHQ- 9 Score 15 9   0 0 0    Fall Risk    09/15/2023    9:41 AM 06/19/2023    9:30 AM  03/19/2023    9:17 AM 03/11/2023    3:12 PM 12/19/2022    8:28 AM  Fall Risk   Falls in the past year? 0 0 0 0 0  Number falls in past yr: 0 0 0 0 0  Injury with Fall?  0 0 0 0  Risk for fall due to :  No Fall Risks No Fall Risks No Fall Risks No Fall Risks  Follow up Falls evaluation completed;Falls prevention discussed Falls evaluation completed Falls evaluation completed Falls evaluation completed Falls evaluation completed    MEDICARE RISK AT HOME: Medicare Risk at Home Any stairs in or around the home?: Yes If so, are there any without handrails?: No Home free of loose throw rugs in walkways, pet beds, electrical cords, etc?: Yes Adequate lighting in your home to reduce risk of falls?: Yes Life alert?: No Use of a cane, walker or w/c?: No Grab bars in the bathroom?: No Shower chair or bench in shower?: Yes Elevated toilet seat or a handicapped toilet?: Yes  TIMED UP AND GO:  Was the test performed?  No    Cognitive Function:    06/27/2015   10:45 AM  MMSE - Mini Mental State Exam  Not completed: Unable to complete        09/19/2023   11:23 AM 11/01/2022   11:10 AM 09/20/2020    2:45 PM  6CIT Screen  What Year? 0 points 0 points 0 points  What month? 0 points 0 points 0 points  What time? 0 points 0 points 0 points  Count back from 20 0 points 0 points 0 points  Months in reverse 0 points 0 points 0 points  Repeat phrase 2 points 0 points 0 points  Total Score 2 points 0 points 0 points    Immunizations Immunization History  Administered Date(s) Administered   Fluad Quad(high Dose 65+) 07/29/2019, 09/20/2020, 07/18/2021, 08/24/2022   Fluad Trivalent(High Dose 65+) 08/20/2023   Influenza Split 08/03/2011, 09/09/2012   Influenza Whole 07/05/2010   Influenza, High Dose Seasonal PF 08/17/2016, 07/23/2017, 09/19/2018   Influenza,inj,Quad PF,6+ Mos 07/10/2013, 07/28/2014, 09/29/2015   PFIZER Comirnaty(Gray Top)Covid-19 Tri-Sucrose Vaccine 03/29/2021   PFIZER(Purple  Top)SARS-COV-2 Vaccination 12/26/2019, 01/18/2020, 08/27/2020   Pfizer Covid-19 Vaccine Bivalent Booster 52yrs & up 08/01/2021   Pfizer(Comirnaty)Fall Seasonal Vaccine 12 years and older 08/24/2022, 08/20/2023   Pneumococcal Conjugate-13 03/31/2015   Pneumococcal Polysaccharide-23 11/15/2014   Tdap 07/02/2012   Zoster Recombinant(Shingrix) 12/07/2021, 02/02/2022   Zoster, Live 07/15/2012    TDAP status: Due, Education has been provided regarding the importance of this vaccine. Advised may receive this vaccine at local pharmacy or Health Dept. Aware to provide a copy of the vaccination record if obtained from local pharmacy or Health Dept. Verbalized acceptance and understanding.  Flu Vaccine status: Up to date  Pneumococcal vaccine status: Up to date  Covid-19 vaccine status: Completed vaccines  Qualifies for Shingles Vaccine? Yes   Zostavax completed Yes   Shingrix Completed?: Yes  Screening Tests Health Maintenance  Topic Date Due   Colonoscopy  12/24/2023   DTaP/Tdap/Td (2 - Td or Tdap) 11/12/2023 (Originally 07/02/2022)   COVID-19 Vaccine (8 - 2023-24 season) 10/15/2023   Medicare Annual Wellness (AWV)  09/18/2024   MAMMOGRAM  10/16/2024   Pneumonia Vaccine  38+ Years old  Completed   INFLUENZA VACCINE  Completed   DEXA SCAN  Completed   Hepatitis C Screening  Completed   Zoster Vaccines- Shingrix  Completed   HPV VACCINES  Aged Out    Health Maintenance  Health Maintenance Due  Topic Date Due   Colonoscopy  12/24/2023    Colorectal cancer screening: Type of screening: Colonoscopy. Completed 12/23/2013. Repeat every 10 years  Mammogram status: Completed 10/18/2023. Repeat every year  Bone Density status: Ordered 09/19/2023. Pt provided with contact info and advised to call to schedule appt.  Lung Cancer Screening: (Low Dose CT Chest recommended if Age 50-80 years, 20 pack-year currently smoking OR have quit w/in 15years.) does not qualify.   Lung Cancer  Screening Referral: N/A  Additional Screening:  Hepatitis C Screening: does qualify; Completed 09/20/2016  Vision Screening: Recommended annual ophthalmology exams for early detection of glaucoma and other disorders of the eye. Is the patient up to date with their annual eye exam?  Yes  Who is the provider or what is the name of the office in which the patient attends annual eye exams? Dr. Dione Booze If pt is not established with a provider, would they like to be referred to a provider to establish care? No .   Dental Screening: Recommended annual dental exams for proper oral hygiene   Community Resource Referral / Chronic Care Management: CRR required this visit?  No   CCM required this visit?  PCP informed of CCM need     Plan:     I have personally reviewed and noted the following in the patient's chart:   Medical and social history Use of alcohol, tobacco or illicit drugs  Current medications and supplements including opioid prescriptions. Patient is not currently taking opioid prescriptions. Functional ability and status Nutritional status Physical activity Advanced directives List of other physicians Hospitalizations, surgeries, and ER visits in previous 12 months Vitals Screenings to include cognitive, depression, and falls Referrals and appointments  In addition, I have reviewed and discussed with patient certain preventive protocols, quality metrics, and best practice recommendations. A written personalized care plan for preventive services as well as general preventive health recommendations were provided to patient.     Shauntae Reitman L Savier Trickett, CMA   09/19/2023   After Visit Summary: (MyChart) Due to this being a telephonic visit, the after visit summary with patients personalized plan was offered to patient via MyChart   Nurse Notes: Patient is due for a Tdap.  She is also due for a DEXA, as she has an appointment coming up for a mammogram, she will schedule for DEXA on  same day.  Patient is requesting a Advance Directives package/forms given to her during her office visit Monday. She had no other concerns to address today.   Medical screening examination/treatment/procedure(s) were performed by non-physician practitioner and as supervising physician I was immediately available for consultation/collaboration.  I agree with above. Jacinta Shoe, MD

## 2023-09-20 NOTE — Telephone Encounter (Signed)
You probably meant a psychologist.  Yes, can you place the referral in, please? Thank

## 2023-09-23 ENCOUNTER — Encounter: Payer: Self-pay | Admitting: Internal Medicine

## 2023-09-23 ENCOUNTER — Ambulatory Visit (INDEPENDENT_AMBULATORY_CARE_PROVIDER_SITE_OTHER): Payer: Medicare HMO | Admitting: Internal Medicine

## 2023-09-23 VITALS — BP 118/70 | HR 62 | Temp 98.7°F | Ht 63.0 in | Wt 188.0 lb

## 2023-09-23 DIAGNOSIS — I700 Atherosclerosis of aorta: Secondary | ICD-10-CM

## 2023-09-23 DIAGNOSIS — I2583 Coronary atherosclerosis due to lipid rich plaque: Secondary | ICD-10-CM | POA: Diagnosis not present

## 2023-09-23 DIAGNOSIS — Z23 Encounter for immunization: Secondary | ICD-10-CM

## 2023-09-23 DIAGNOSIS — F419 Anxiety disorder, unspecified: Secondary | ICD-10-CM | POA: Diagnosis not present

## 2023-09-23 DIAGNOSIS — R739 Hyperglycemia, unspecified: Secondary | ICD-10-CM

## 2023-09-23 DIAGNOSIS — Z01818 Encounter for other preprocedural examination: Secondary | ICD-10-CM | POA: Diagnosis not present

## 2023-09-23 DIAGNOSIS — N183 Chronic kidney disease, stage 3 unspecified: Secondary | ICD-10-CM

## 2023-09-23 DIAGNOSIS — M7710 Lateral epicondylitis, unspecified elbow: Secondary | ICD-10-CM | POA: Insufficient documentation

## 2023-09-23 DIAGNOSIS — E785 Hyperlipidemia, unspecified: Secondary | ICD-10-CM | POA: Diagnosis not present

## 2023-09-23 DIAGNOSIS — E538 Deficiency of other specified B group vitamins: Secondary | ICD-10-CM | POA: Diagnosis not present

## 2023-09-23 DIAGNOSIS — Z0000 Encounter for general adult medical examination without abnormal findings: Secondary | ICD-10-CM

## 2023-09-23 DIAGNOSIS — M7711 Lateral epicondylitis, right elbow: Secondary | ICD-10-CM

## 2023-09-23 LAB — CBC WITH DIFFERENTIAL/PLATELET
Basophils Absolute: 0 10*3/uL (ref 0.0–0.1)
Basophils Relative: 0.4 % (ref 0.0–3.0)
Eosinophils Absolute: 0.3 10*3/uL (ref 0.0–0.7)
Eosinophils Relative: 5.9 % — ABNORMAL HIGH (ref 0.0–5.0)
HCT: 37.6 % (ref 36.0–46.0)
Hemoglobin: 12.5 g/dL (ref 12.0–15.0)
Lymphocytes Relative: 33.1 % (ref 12.0–46.0)
Lymphs Abs: 1.9 10*3/uL (ref 0.7–4.0)
MCHC: 33.3 g/dL (ref 30.0–36.0)
MCV: 80.8 fL (ref 78.0–100.0)
Monocytes Absolute: 0.7 10*3/uL (ref 0.1–1.0)
Monocytes Relative: 12.6 % — ABNORMAL HIGH (ref 3.0–12.0)
Neutro Abs: 2.8 10*3/uL (ref 1.4–7.7)
Neutrophils Relative %: 48 % (ref 43.0–77.0)
Platelets: 177 10*3/uL (ref 150.0–400.0)
RBC: 4.66 Mil/uL (ref 3.87–5.11)
RDW: 14.5 % (ref 11.5–15.5)
WBC: 5.9 10*3/uL (ref 4.0–10.5)

## 2023-09-23 LAB — COMPREHENSIVE METABOLIC PANEL
ALT: 11 U/L (ref 0–35)
AST: 18 U/L (ref 0–37)
Albumin: 4.4 g/dL (ref 3.5–5.2)
Alkaline Phosphatase: 77 U/L (ref 39–117)
BUN: 19 mg/dL (ref 6–23)
CO2: 29 meq/L (ref 19–32)
Calcium: 9.8 mg/dL (ref 8.4–10.5)
Chloride: 104 meq/L (ref 96–112)
Creatinine, Ser: 1.19 mg/dL (ref 0.40–1.20)
GFR: 45.14 mL/min — ABNORMAL LOW (ref >60.00)
Glucose, Bld: 106 mg/dL — ABNORMAL HIGH (ref 70–99)
Potassium: 4.5 meq/L (ref 3.5–5.1)
Sodium: 142 meq/L (ref 135–145)
Total Bilirubin: 0.6 mg/dL (ref 0.2–1.2)
Total Protein: 7.7 g/dL (ref 6.0–8.3)

## 2023-09-23 LAB — PROTIME-INR
INR: 1.1 {ratio} — ABNORMAL HIGH (ref 0.8–1.0)
Prothrombin Time: 11.6 s (ref 9.6–13.1)

## 2023-09-23 LAB — TSH: TSH: 3.36 u[IU]/mL (ref 0.35–5.50)

## 2023-09-23 LAB — HEMOGLOBIN A1C: Hgb A1c MFr Bld: 5.9 % (ref 4.6–6.5)

## 2023-09-23 LAB — VITAMIN B12: Vitamin B-12: 807 pg/mL (ref 211–911)

## 2023-09-23 MED ORDER — ROSUVASTATIN CALCIUM 5 MG PO TABS: 5.0000 mg | ORAL_TABLET | Freq: Every day | ORAL | 3 refills | Status: DC

## 2023-09-23 MED ORDER — VITAMIN B-12 1000 MCG SL SUBL: 1.0000 | SUBLINGUAL_TABLET | Freq: Every day | SUBLINGUAL | 3 refills | Status: AC

## 2023-09-23 MED ORDER — TELMISARTAN 40 MG PO TABS: 40.0000 mg | ORAL_TABLET | Freq: Every day | ORAL | 3 refills | Status: DC

## 2023-09-23 MED ORDER — LORAZEPAM 1 MG PO TABS: ORAL_TABLET | ORAL | 3 refills | Status: DC

## 2023-09-23 MED ORDER — AMLODIPINE BESYLATE 2.5 MG PO TABS: 2.5000 mg | ORAL_TABLET | Freq: Every day | ORAL | 3 refills | Status: DC

## 2023-09-23 MED ORDER — TELMISARTAN 80 MG PO TABS: 80.0000 mg | ORAL_TABLET | Freq: Every day | ORAL | 3 refills | Status: DC

## 2023-09-23 MED ORDER — TRIAMTERENE-HCTZ 37.5-25 MG PO TABS: 0.5000 | ORAL_TABLET | Freq: Every day | ORAL | 3 refills | Status: DC

## 2023-09-23 MED ORDER — IPRATROPIUM BROMIDE 0.03 % NA SOLN: 2.0000 | Freq: Four times a day (QID) | NASAL | 1 refills | Status: AC

## 2023-09-23 MED ORDER — ALIGN 4 MG PO CAPS: 1.0000 | ORAL_CAPSULE | Freq: Every day | ORAL | 1 refills | Status: AC

## 2023-09-23 MED ORDER — VENLAFAXINE HCL ER 75 MG PO CP24: 75.0000 mg | ORAL_CAPSULE | Freq: Every day | ORAL | 1 refills | Status: DC

## 2023-09-23 MED ORDER — FAMOTIDINE 20 MG PO TABS: 20.0000 mg | ORAL_TABLET | Freq: Every day | ORAL | 2 refills | Status: DC

## 2023-09-23 NOTE — Assessment & Plan Note (Signed)
On Effexor XR Lorazepam prn  Potential benefits of a long term benzodiazepines  use as well as potential risks  and complications were explained to the patient and were aknowledged.

## 2023-09-23 NOTE — Assessment & Plan Note (Signed)
Prevnar today RSV and tDAP at the pharmacy

## 2023-09-23 NOTE — Addendum Note (Signed)
Addended by: Wyvonne Lenz on: 09/23/2023 04:29 PM   Modules accepted: Orders

## 2023-09-23 NOTE — Assessment & Plan Note (Signed)
CT:  Coronary artery atherosclerosis. Aortic Atherosclerosis  On Cresor, ASA No sx's

## 2023-09-23 NOTE — Progress Notes (Signed)
Subjective:  Patient ID: Jane Mooney, female    DOB: Oct 05, 1951  Age: 72 y.o. MRN: [id number]  CC: Medical Management of Chronic Issues (3 mnth f/u)   HPI CHANDLER MANG presents for pre-op eval Req by Dr Eulah Pont Reason - planning R TKR C/o R tennis elbow  Outpatient Medications Prior to Visit  Medication Sig Dispense Refill   acetaminophen (TYLENOL) 500 MG tablet Take 1,000 mg by mouth every 6 (six) hours as needed for moderate pain or headache.     aspirin EC 81 MG tablet Take 81 mg by mouth daily. Swallow whole.     cholecalciferol (VITAMIN D) 1000 units tablet Take 2 tablets (2,000 Units total) by mouth daily. (Patient taking differently: Take 1,000 Units by mouth daily.) 100 tablet 11   fish oil-omega-3 fatty acids 1000 MG capsule Take 1 g by mouth daily.     fluticasone (FLONASE) 50 MCG/ACT nasal spray Place 1 spray into both nostrils daily as needed for allergies. 48 g 3   hydrOXYzine (ATARAX) 25 MG tablet Take 1-2 tablets at night as needed for itching. Caution as this medication can make you drowsy 180 tablet 3   loratadine (CLARITIN) 10 MG tablet Take 1 tablet by mouth daily.     triamcinolone ointment (KENALOG) 0.1 % APPLY 1 APPLICATION TOPICALLY 4 TIMES A DAY 80 g 1   amLODipine (NORVASC) 2.5 MG tablet TAKE 1 TABLET BY MOUTH EVERY DAY 90 tablet 3   Cyanocobalamin (VITAMIN B-12) 1000 MCG SUBL Place 1 tablet (1,000 mcg total) under the tongue daily. 100 tablet 3   famotidine (PEPCID) 20 MG tablet TAKE 1 TABLET BY MOUTH EVERY DAY 90 tablet 2   ipratropium (ATROVENT) 0.03 % nasal spray SPRAY 2 SPRAYS INTO EACH NOSTRIL 4 TIMES A DAY AS NEEDED FOR RHINITIS 90 mL 1   LORazepam (ATIVAN) 1 MG tablet TAKE 1 TABLET BY MOUTH TWICE A DAY AS NEEDED FOR ANXIETY 60 tablet 3   Probiotic Product (ALIGN) 4 MG CAPS Take 1 capsule (4 mg total) by mouth daily. 30 capsule 1   rosuvastatin (CRESTOR) 5 MG tablet TAKE 1 TABLET (5 MG TOTAL) BY MOUTH DAILY. 90 tablet 3   telmisartan (MICARDIS) 80 MG  tablet TAKE 1 TABLET BY MOUTH EVERY DAY 90 tablet 3   triamterene-hydrochlorothiazide (MAXZIDE-25) 37.5-25 MG tablet Take 0.5 tablets by mouth daily. 90 tablet 3   venlafaxine XR (EFFEXOR-XR) 75 MG 24 hr capsule TAKE 1 CAPSULE BY MOUTH EVERY DAY WITH BREAKFAST 90 capsule 1   No facility-administered medications prior to visit.    ROS: Review of Systems  Constitutional:  Negative for activity change, appetite change, chills, fatigue and unexpected weight change.  HENT:  Negative for congestion, mouth sores and sinus pressure.   Eyes:  Negative for visual disturbance.  Respiratory:  Negative for cough and chest tightness.   Gastrointestinal:  Negative for abdominal pain and nausea.  Genitourinary:  Negative for difficulty urinating, frequency and vaginal pain.  Musculoskeletal:  Positive for arthralgias and gait problem. Negative for back pain.  Skin:  Negative for pallor and rash.  Neurological:  Negative for dizziness, tremors, weakness, numbness and headaches.  Psychiatric/Behavioral:  Negative for confusion and sleep disturbance.     Objective:  BP 118/70 (BP Location: Left Arm, Patient Position: Sitting, Cuff Size: Normal)   Pulse 62   Temp 98.7 F (37.1 C) (Oral)   Ht 5\' 3"  (1.6 m)   Wt 188 lb (85.3 kg)   SpO2 97%  BMI 33.30 kg/m   BP Readings from Last 3 Encounters:  09/23/23 118/70  09/05/23 138/60  06/19/23 110/60    Wt Readings from Last 3 Encounters:  09/23/23 188 lb (85.3 kg)  09/19/23 187 lb (84.8 kg)  09/05/23 187 lb 4.8 oz (85 kg)    Physical Exam Constitutional:      General: She is not in acute distress.    Appearance: She is well-developed. She is obese.  HENT:     Head: Normocephalic.     Right Ear: External ear normal.     Left Ear: External ear normal.     Nose: Nose normal.  Eyes:     General:        Right eye: No discharge.        Left eye: No discharge.     Conjunctiva/sclera: Conjunctivae normal.     Pupils: Pupils are equal, round,  and reactive to light.  Neck:     Thyroid: No thyromegaly.     Vascular: No JVD.     Trachea: No tracheal deviation.  Cardiovascular:     Rate and Rhythm: Normal rate and regular rhythm.     Heart sounds: Normal heart sounds.  Pulmonary:     Effort: No respiratory distress.     Breath sounds: No stridor. No wheezing.  Abdominal:     General: Bowel sounds are normal. There is no distension.     Palpations: Abdomen is soft. There is no mass.     Tenderness: There is no abdominal tenderness. There is no guarding or rebound.  Musculoskeletal:        General: Tenderness present.     Cervical back: Normal range of motion and neck supple. No rigidity.     Right lower leg: No edema.     Left lower leg: No edema.  Lymphadenopathy:     Cervical: No cervical adenopathy.  Skin:    Findings: No erythema or rash.  Neurological:     Cranial Nerves: No cranial nerve deficit.     Motor: No abnormal muscle tone.     Coordination: Coordination normal.     Deep Tendon Reflexes: Reflexes normal.  Psychiatric:        Behavior: Behavior normal.        Thought Content: Thought content normal.        Judgment: Judgment normal.    R knee w/pain   Lab Results  Component Value Date   WBC 5.5 09/19/2022   HGB 12.0 09/19/2022   HCT 37.1 09/19/2022   PLT 190.0 09/19/2022   GLUCOSE 100 (H) 03/21/2023   CHOL 131 04/20/2022   TRIG 82.0 04/20/2022   HDL 51.70 04/20/2022   LDLDIRECT 153.6 07/06/2013   LDLCALC 63 04/20/2022   ALT 16 03/21/2023   AST 21 03/21/2023   NA 140 03/21/2023   K 3.8 03/21/2023   CL 103 03/21/2023   CREATININE 1.09 03/21/2023   BUN 16 03/21/2023   CO2 29 03/21/2023   TSH 2.74 12/19/2022   INR 1.0 06/23/2020   HGBA1C 5.8 09/19/2022    CT Chest Wo Contrast  Result Date: 06/21/2023 CLINICAL DATA:  Follow-up of right middle lobe pulmonary nodule EXAM: CT CHEST WITHOUT CONTRAST TECHNIQUE: Multidetector CT imaging of the chest was performed following the standard protocol  without IV contrast. RADIATION DOSE REDUCTION: This exam was performed according to the departmental dose-optimization program which includes automated exposure control, adjustment of the mA and/or kV according to patient size and/or use of  iterative reconstruction technique. COMPARISON:  10/15/2022 FINDINGS: Cardiovascular: Bovine arch. Aortic atherosclerosis. Borderline cardiomegaly. Lad coronary artery calcification. Mediastinum/Nodes: No mediastinal or hilar adenopathy, given limitations of unenhanced CT. Tiny hiatal hernia. Lungs/Pleura: No pleural fluid. Right middle lobe pulmonary nodule measures 8 x 7 mm on 86/4 and is unchanged back to 10/11/2021. Clear left lung. Upper Abdomen: Normal imaged portions of the liver, spleen, stomach, pancreas, adrenal glands, kidneys. Musculoskeletal: No acute osseous abnormality. IMPRESSION: 1. 8 x 7 mm right middle lobe pulmonary nodule is unchanged back to 10/11/2021. Strongly favored to be benign. As 2 year stability is technically required to confirm benignity, consider chest CT follow-up after November of 2024 to confirm benignity. 2.  No acute process in the chest. 3. Coronary artery atherosclerosis. Aortic Atherosclerosis (ICD10-I70.0). Electronically Signed   By: Jeronimo Greaves M.D.   On: 06/21/2023 16:58    Assessment & Plan:   Problem List Items Addressed This Visit     Anxiety disorder    On Effexor XR Lorazepam prn  Potential benefits of a long term benzodiazepines  use as well as potential risks  and complications were explained to the patient and were aknowledged.      Relevant Medications   venlafaxine XR (EFFEXOR-XR) 75 MG 24 hr capsule   LORazepam (ATIVAN) 1 MG tablet   Dyslipidemia   Relevant Medications   rosuvastatin (CRESTOR) 5 MG tablet   Other Relevant Orders   CBC with Differential/Platelet   Comprehensive metabolic panel   TSH   Vitamin B 12 deficiency    On B12      Relevant Orders   Vitamin B12   Well adult exam     Prevnar today RSV and tDAP at the pharmacy      CRI (chronic renal insufficiency), stage 3 (moderate) (HCC)    Monitor labs Avoid NSAIDs.  Hydrate well      Aortic atherosclerosis (HCC)    CT:  Coronary artery atherosclerosis. Aortic Atherosclerosis  On Cresor, ASA No sx's      Relevant Medications   triamterene-hydrochlorothiazide (MAXZIDE-25) 37.5-25 MG tablet   rosuvastatin (CRESTOR) 5 MG tablet   amLODipine (NORVASC) 2.5 MG tablet   telmisartan (MICARDIS) 40 MG tablet   Coronary atherosclerosis    CT:  Coronary artery atherosclerosis. Aortic Atherosclerosis  On Cresor, ASA No sx's      Relevant Medications   triamterene-hydrochlorothiazide (MAXZIDE-25) 37.5-25 MG tablet   rosuvastatin (CRESTOR) 5 MG tablet   amLODipine (NORVASC) 2.5 MG tablet   telmisartan (MICARDIS) 40 MG tablet   Other Relevant Orders   CBC with Differential/Platelet   Comprehensive metabolic panel   Protime-INR   Hemoglobin A1c   TSH   Vitamin B12   Preop exam for internal medicine - Primary    Clear for TKR R Thank you!      Relevant Orders   CBC with Differential/Platelet   Comprehensive metabolic panel   Protime-INR   Hemoglobin A1c   TSH   Vitamin B12   Tennis elbow    Use tennis elbow strap      Other Visit Diagnoses     Hyperglycemia       Relevant Orders   Hemoglobin A1c         Meds ordered this encounter  Medications   venlafaxine XR (EFFEXOR-XR) 75 MG 24 hr capsule    Sig: Take 1 capsule (75 mg total) by mouth daily with breakfast.    Dispense:  90 capsule    Refill:  1  triamterene-hydrochlorothiazide (MAXZIDE-25) 37.5-25 MG tablet    Sig: Take 0.5 tablets by mouth daily.    Dispense:  90 tablet    Refill:  3   DISCONTD: telmisartan (MICARDIS) 80 MG tablet    Sig: Take 1 tablet (80 mg total) by mouth daily.    Dispense:  90 tablet    Refill:  3   rosuvastatin (CRESTOR) 5 MG tablet    Sig: Take 1 tablet (5 mg total) by mouth daily.    Dispense:  90  tablet    Refill:  3   Probiotic Product (ALIGN) 4 MG CAPS    Sig: Take 1 capsule (4 mg total) by mouth daily.    Dispense:  30 capsule    Refill:  1   LORazepam (ATIVAN) 1 MG tablet    Sig: TAKE 1 TABLET BY MOUTH TWICE A DAY AS NEEDED FOR ANXIETY    Dispense:  60 tablet    Refill:  3    This request is for a new prescription for a controlled substance as required by Federal/State law.   ipratropium (ATROVENT) 0.03 % nasal spray    Sig: Place 2 sprays into both nostrils 4 (four) times daily.    Dispense:  90 mL    Refill:  1   famotidine (PEPCID) 20 MG tablet    Sig: Take 1 tablet (20 mg total) by mouth daily.    Dispense:  90 tablet    Refill:  2   Cyanocobalamin (VITAMIN B-12) 1000 MCG SUBL    Sig: Place 1 tablet (1,000 mcg total) under the tongue daily.    Dispense:  100 tablet    Refill:  3   amLODipine (NORVASC) 2.5 MG tablet    Sig: Take 1 tablet (2.5 mg total) by mouth daily.    Dispense:  90 tablet    Refill:  3   telmisartan (MICARDIS) 40 MG tablet    Sig: Take 1 tablet (40 mg total) by mouth daily.    Dispense:  90 tablet    Refill:  3      Follow-up: Return in about 3 months (around 12/24/2023) for a follow-up visit.  Sonda Primes, MD

## 2023-09-23 NOTE — Assessment & Plan Note (Signed)
Monitor labs Avoid NSAIDs.  Hydrate well 

## 2023-09-23 NOTE — Assessment & Plan Note (Signed)
On B12 

## 2023-09-23 NOTE — Assessment & Plan Note (Signed)
Use tennis elbow strap

## 2023-09-23 NOTE — Assessment & Plan Note (Signed)
Clear for TKR R Thank you!

## 2023-09-23 NOTE — Addendum Note (Signed)
Addended by: Delsa Grana R on: 09/23/2023 09:53 AM   Modules accepted: Orders

## 2023-09-28 ENCOUNTER — Encounter: Payer: Self-pay | Admitting: Internal Medicine

## 2023-10-18 ENCOUNTER — Ambulatory Visit
Admission: RE | Admit: 2023-10-18 | Discharge: 2023-10-18 | Disposition: A | Discharge status: Home / Self Care | Payer: Medicare HMO | Source: Ambulatory Visit | Attending: Internal Medicine | Admitting: Internal Medicine

## 2023-10-18 DIAGNOSIS — Z1231 Encounter for screening mammogram for malignant neoplasm of breast: Secondary | ICD-10-CM | POA: Diagnosis not present

## 2023-10-20 ENCOUNTER — Other Ambulatory Visit: Payer: Self-pay | Admitting: Internal Medicine

## 2023-10-23 ENCOUNTER — Ambulatory Visit (HOSPITAL_BASED_OUTPATIENT_CLINIC_OR_DEPARTMENT_OTHER): Payer: Medicare HMO | Admitting: Pulmonary Disease

## 2023-10-23 ENCOUNTER — Encounter (HOSPITAL_BASED_OUTPATIENT_CLINIC_OR_DEPARTMENT_OTHER): Payer: Self-pay | Admitting: Pulmonary Disease

## 2023-10-23 VITALS — BP 128/62 | HR 62 | Resp 16 | Ht 63.0 in | Wt 188.4 lb

## 2023-10-23 DIAGNOSIS — R911 Solitary pulmonary nodule: Secondary | ICD-10-CM | POA: Diagnosis not present

## 2023-10-23 NOTE — Progress Notes (Signed)
Subjective:   PATIENT ID: Jane Mooney GENDER: female DOB: 04/06/1951, MRN: [id number]   HPI  Chief Complaint  Patient presents with   Follow-up    Lung Nodule-breathing has been fine.    Reason for Visit: Follow-up lung nodule, chronic cough  Ms. Camylle Parham is a 72 year old female with allergic rhinitis, history of recurrent urticaria, hypertension and CKD who presents for follow-up.  Synopsis 09/26/21 For the last six months she gradually developed chronic cough that is unproductive. Denies preceding illness. Denies wheezing or shortness of breath. Occurs mostly at night which wakes her up with episodes that last for few minutes until she takes a ricola. No daytime cough. Drinks hot tea or water at bedtime. The cough is severe enough to cause her a sore throat later. She has been following Dr. Dellis Anes in [facility]ergy. Compliant with her current regimen with zyrtec. Still has some post-nasal drainage. No recent urticaria >1 year. Her PCP started on her pepcid 3-4 months ago.   10/17/21 She presents to review CT scan. She is anxious and was not able to sleep last night. She does report improvement in her cough and has not coughed in the last two days.  04/23/22 Since our last visit her nonproductive cough is present but not severe. Occasionally still takes cough syrup and compliant with her H2 blocker and antihistamines per Allergy. Had CT chest completed last week and awaiting discussion on results. Denies shortness of breath, wheezing, hemoptysis. No changes in appetite, weight. No night sweats.   12/18/22 Since our last visit she continues to have mild nonproductive cough that worsens when laying down at night. Does have nasal congestion. On pepcid nightly. Denies shortness of breath, cough or wheezing.  10/23/23 Since our last visit she reports mild cough. No wheezing or shortness of breath  Social History: Never smoker No pets Retired Product/process development scientist exposures: None.  Denies dust or industrial exposures.  Past Medical History:  Diagnosis Date   Allergic rhinitis    Anemia    Anxiety 2010   Chronic kidney disease    renal insufficiency    Depression    DJD (degenerative joint disease)    Left knee   Heart murmur    as a child    Hemorrhoid    HTN (hypertension)    Hyperlipidemia    LBP (low back pain)    MVP (mitral valve prolapse)    PONV (postoperative nausea and vomiting)    Symptomatic PVCs    Urticaria    recurrent     Family History  Problem Relation Age of Onset   Hypertension Mother    Breast cancer Maternal Grandmother    Diabetes Other    Hypertension Other    Breast cancer Other    Cancer Other        breast    Colon cancer Neg Hx    Esophageal cancer Neg Hx    Stomach cancer Neg Hx    Rectal cancer Neg Hx      Social History   Occupational History   Occupation: Retired Teacher, adult education: [place]    Comment: 01/2010  Tobacco Use   Smoking status: Never   Smokeless tobacco: Never  Vaping Use   Vaping status: Never Used  Substance and Sexual Activity   Alcohol use: Not Currently   Drug use: No   Sexual activity: Yes    Allergies  Allergen Reactions   Citalopram     weak  Mirtazapine     Made pt very groggy   Quinapril Hcl Cough     Outpatient Medications Prior to Visit  Medication Sig Dispense Refill   acetaminophen (TYLENOL) 500 MG tablet Take 1,000 mg by mouth every 6 (six) hours as needed for moderate pain or headache.     amLODipine (NORVASC) 2.5 MG tablet Take 1 tablet (2.5 mg total) by mouth daily. 90 tablet 3   aspirin EC 81 MG tablet Take 81 mg by mouth daily. Swallow whole.     cholecalciferol (VITAMIN D) 1000 units tablet Take 2 tablets (2,000 Units total) by mouth daily. (Patient taking differently: Take 1,000 Units by mouth daily.) 100 tablet 11   Cyanocobalamin (VITAMIN B-12) 1000 MCG SUBL Place 1 tablet (1,000 mcg total) under the tongue daily. 100 tablet 3   famotidine (PEPCID) 20 MG  tablet Take 1 tablet (20 mg total) by mouth daily. 90 tablet 2   fish oil-omega-3 fatty acids 1000 MG capsule Take 1 g by mouth daily.     fluticasone (FLONASE) 50 MCG/ACT nasal spray Place 1 spray into both nostrils daily as needed for allergies. 48 g 3   hydrOXYzine (ATARAX) 25 MG tablet Take 1-2 tablets at night as needed for itching. Caution as this medication can make you drowsy 180 tablet 3   ipratropium (ATROVENT) 0.03 % nasal spray Place 2 sprays into both nostrils 4 (four) times daily. 90 mL 1   loratadine (CLARITIN) 10 MG tablet Take 1 tablet by mouth daily.     LORazepam (ATIVAN) 1 MG tablet TAKE 1 TABLET BY MOUTH TWICE A DAY AS NEEDED FOR ANXIETY 60 tablet 2   Probiotic Product (ALIGN) 4 MG CAPS Take 1 capsule (4 mg total) by mouth daily. 30 capsule 1   rosuvastatin (CRESTOR) 5 MG tablet Take 1 tablet (5 mg total) by mouth daily. 90 tablet 3   telmisartan (MICARDIS) 40 MG tablet Take 1 tablet (40 mg total) by mouth daily. 90 tablet 3   triamcinolone ointment (KENALOG) 0.1 % APPLY 1 APPLICATION TOPICALLY 4 TIMES A DAY 80 g 1   triamterene-hydrochlorothiazide (MAXZIDE-25) 37.5-25 MG tablet Take 0.5 tablets by mouth daily. 90 tablet 3   venlafaxine XR (EFFEXOR-XR) 75 MG 24 hr capsule Take 1 capsule (75 mg total) by mouth daily with breakfast. 90 capsule 1   No facility-administered medications prior to visit.    Review of Systems  Constitutional:  Negative for chills, diaphoresis, fever, malaise/fatigue and weight loss.  HENT:  Negative for congestion.   Respiratory:  Negative for cough, hemoptysis, sputum production, shortness of breath and wheezing.   Cardiovascular:  Negative for chest pain, palpitations and leg swelling.     Objective:   Vitals:   10/23/23 1100  BP: 128/62  Pulse: 62  Resp: 16  SpO2: 99%  Weight: 188 lb 6.4 oz (85.5 kg)  Height: 5\' 3"  (1.6 m)   SpO2: 99 %  Physical Exam: General: Well-appearing, no acute distress HENT: [place], AT Eyes: EOMI, no  scleral icterus Respiratory: Clear to auscultation bilaterally.  No crackles, wheezing or rales Cardiovascular: RRR, -M/R/G, no JVD Extremities:-Edema,-tenderness Neuro: AAO x4, CNII-XII grossly intact Psych: Normal mood, normal affect   Data Reviewed:  Imaging: CXR 05/02/2021-normal chest x-ray.  No infiltrate, effusion or edema.  No overt parenchymal abnormalities CT Chest HR 10/11/21 - No evidence of interstitial lung disease. RML nodule measuring 8 x 7mm. No prior CT to compare. CT Chest 04/20/22 - Unchanged 8 mm RML nodule CT Chest  10/15/22 - Stable 8 mm RML nodule CT Chest 06/18/23 - Stable 8 x 7 cm RML nodule  PFT: 09/26/2021 FVC 2.49 (113%) FEV1 2.19 (129%) Ratio 76 TLC 74% DLCO 74% Interpretation: No obstructive defect.  Mild restrictive defect with mildly reduced gas exchange.  No significant bronchodilator response.  Normal F-V loops.  12/18/22 FVC 2.52 (91%) FEV1 2.23 (107%) Ratio 85  TLC 88% DLCO 86% Interpretation: Normal PFTs   Labs: CBC    Component Value Date/Time   WBC 5.9 09/23/2023 0918   RBC 4.66 09/23/2023 0918   HGB 12.5 09/23/2023 0918   HCT 37.6 09/23/2023 0918   PLT 177.0 09/23/2023 0918   MCV 80.8 09/23/2023 0918   MCH 26.3 06/23/2020 0922   MCHC 33.3 09/23/2023 0918   RDW 14.5 09/23/2023 0918   LYMPHSABS 1.9 09/23/2023 0918   MONOABS 0.7 09/23/2023 0918   EOSABS 0.3 09/23/2023 0918   BASOSABS 0.0 09/23/2023 0918   Absolute eos  08/30/2021-200 12/12/21 - 100    Assessment & Plan:   Discussion: 72 year old female never smoker with allergic rhinitis, hx of recurrent urticaria, HTN and CKD who presents for follow-up. No cough. Follow-up for nodule. Discussed risks in radiation and benefits of nodule surveillance. Patient wishes to pursue for two year surveillance.   Incidental RML nodule 8mm --ORDER CT Chest without contrast to complete recommended two year surveillance for nodule stability  Mild restrictive defect with reduced DLCO -  resolved Normalized PFTs. Suspect this is erroneous or resolved defect CT chest with no evidence of ILD --No further PFTs needed  Health Maintenance Immunization History  Administered Date(s) Administered   Fluad Quad(high Dose 65+) 07/29/2019, 09/20/2020, 07/18/2021, 08/24/2022   Fluad Trivalent(High Dose 65+) 08/20/2023   Influenza Split 08/03/2011, 09/09/2012   Influenza Whole 07/05/2010   Influenza, High Dose Seasonal PF 08/17/2016, 07/23/2017, 09/19/2018   Influenza,inj,Quad PF,6+ Mos 07/10/2013, 07/28/2014, 09/29/2015   PFIZER Comirnaty(Gray Top)Covid-19 Tri-Sucrose Vaccine 03/29/2021   PFIZER(Purple Top)SARS-COV-2 Vaccination 12/26/2019, 01/18/2020, 08/27/2020   PNEUMOCOCCAL CONJUGATE-20 09/23/2023   Pfizer Covid-19 Vaccine Bivalent Booster 6yrs & up 08/01/2021   Pfizer(Comirnaty)Fall Seasonal Vaccine 12 years and older 08/24/2022, 08/20/2023   Pneumococcal Conjugate-13 03/31/2015   Pneumococcal Polysaccharide-23 11/15/2014   Tdap 07/02/2012   Zoster Recombinant(Shingrix) 12/07/2021, 02/02/2022   Zoster, Live 07/15/2012   CT Lung Screen- not indicated. Never smoker  No orders of the defined types were placed in this encounter.  No orders of the defined types were placed in this encounter.  No follow-ups on file.  I have spent a total time of 35-minutes on the day of the appointment including chart review, data review, collecting history, coordinating care and discussing medical diagnosis and plan with the patient/family. Past medical history, allergies, medications were reviewed. Pertinent imaging, labs and tests included in this note have been reviewed and interpreted independently by me.  Shavar Gorka Mechele Collin, MD [facility] 10/23/2023 11:02 AM  Office Number [phone]

## 2023-10-23 NOTE — Patient Instructions (Addendum)
Incidental RML nodule 8mm --ORDER CT Chest without contrast to complete recommended two year surveillance for nodule stability  Mild restrictive defect with reduced DLCO - resolved Normalized PFTs. Suspect this is erroneous or resolved defect CT chest with no evidence of ILD --No further PFTs needed

## 2023-10-24 ENCOUNTER — Encounter (HOSPITAL_BASED_OUTPATIENT_CLINIC_OR_DEPARTMENT_OTHER): Payer: Self-pay | Admitting: Pulmonary Disease

## 2023-10-30 DIAGNOSIS — Z01419 Encounter for gynecological examination (general) (routine) without abnormal findings: Secondary | ICD-10-CM | POA: Diagnosis not present

## 2023-10-30 DIAGNOSIS — Z780 Asymptomatic menopausal state: Secondary | ICD-10-CM | POA: Diagnosis not present

## 2023-11-01 ENCOUNTER — Ambulatory Visit
Admission: RE | Admit: 2023-11-01 | Discharge: 2023-11-01 | Disposition: A | Discharge status: Home / Self Care | Payer: Medicare HMO | Source: Ambulatory Visit | Attending: Pulmonary Disease | Admitting: Pulmonary Disease

## 2023-11-01 DIAGNOSIS — R911 Solitary pulmonary nodule: Secondary | ICD-10-CM | POA: Diagnosis not present

## 2023-12-04 ENCOUNTER — Encounter (HOSPITAL_BASED_OUTPATIENT_CLINIC_OR_DEPARTMENT_OTHER): Payer: Self-pay | Admitting: Pulmonary Disease

## 2023-12-24 ENCOUNTER — Ambulatory Visit: Payer: Medicare HMO | Admitting: Internal Medicine

## 2024-01-09 ENCOUNTER — Encounter: Payer: Self-pay | Admitting: Internal Medicine

## 2024-01-09 ENCOUNTER — Ambulatory Visit: Payer: Medicare HMO | Admitting: Internal Medicine

## 2024-01-09 VITALS — BP 118/78 | HR 61 | Temp 98.3°F | Ht 63.0 in | Wt 185.4 lb

## 2024-01-09 DIAGNOSIS — I10 Essential (primary) hypertension: Secondary | ICD-10-CM | POA: Diagnosis not present

## 2024-01-09 DIAGNOSIS — E559 Vitamin D deficiency, unspecified: Secondary | ICD-10-CM | POA: Diagnosis not present

## 2024-01-09 DIAGNOSIS — F419 Anxiety disorder, unspecified: Secondary | ICD-10-CM

## 2024-01-09 DIAGNOSIS — N644 Mastodynia: Secondary | ICD-10-CM | POA: Diagnosis not present

## 2024-01-09 NOTE — Assessment & Plan Note (Addendum)
 R breast pain - new in 11/2023 R breast US

## 2024-01-09 NOTE — Assessment & Plan Note (Signed)
 On Vit D

## 2024-01-09 NOTE — Progress Notes (Signed)
 Subjective:  Patient ID: Jane Mooney, female    DOB: July 03, 1951  Age: 73 y.o. MRN: [id number]  CC: Follow-up (3 month follow up / discuss heart issues her brother has so she can be evaluated for / right breast is tender, smaller than the left)   HPI Jane Mooney presents for R breast pain ?amyloid test - TTR gene  Outpatient Medications Prior to Visit  Medication Sig Dispense Refill   acetaminophen (TYLENOL) 500 MG tablet Take 1,000 mg by mouth every 6 (six) hours as needed for moderate pain or headache.     amLODipine (NORVASC) 2.5 MG tablet Take 1 tablet (2.5 mg total) by mouth daily. 90 tablet 3   aspirin EC 81 MG tablet Take 81 mg by mouth daily. Swallow whole.     cholecalciferol (VITAMIN D) 1000 units tablet Take 2 tablets (2,000 Units total) by mouth daily. (Patient taking differently: Take 1,000 Units by mouth daily.) 100 tablet 11   Cyanocobalamin (VITAMIN B-12) 1000 MCG SUBL Place 1 tablet (1,000 mcg total) under the tongue daily. 100 tablet 3   famotidine (PEPCID) 20 MG tablet Take 1 tablet (20 mg total) by mouth daily. 90 tablet 2   fish oil-omega-3 fatty acids 1000 MG capsule Take 1 g by mouth daily.     fluticasone (FLONASE) 50 MCG/ACT nasal spray Place 1 spray into both nostrils daily as needed for allergies. 48 g 3   hydrOXYzine (ATARAX) 25 MG tablet Take 1-2 tablets at night as needed for itching. Caution as this medication can make you drowsy 180 tablet 3   ipratropium (ATROVENT) 0.03 % nasal spray Place 2 sprays into both nostrils 4 (four) times daily. 90 mL 1   loratadine (CLARITIN) 10 MG tablet Take 1 tablet by mouth daily.     LORazepam (ATIVAN) 1 MG tablet TAKE 1 TABLET BY MOUTH TWICE A DAY AS NEEDED FOR ANXIETY 60 tablet 2   Probiotic Product (ALIGN) 4 MG CAPS Take 1 capsule (4 mg total) by mouth daily. 30 capsule 1   rosuvastatin (CRESTOR) 5 MG tablet Take 1 tablet (5 mg total) by mouth daily. 90 tablet 3   telmisartan (MICARDIS) 40 MG tablet Take 1 tablet (40  mg total) by mouth daily. 90 tablet 3   triamcinolone ointment (KENALOG) 0.1 % APPLY 1 APPLICATION TOPICALLY 4 TIMES A DAY 80 g 1   triamterene-hydrochlorothiazide (MAXZIDE-25) 37.5-25 MG tablet Take 0.5 tablets by mouth daily. 90 tablet 3   venlafaxine XR (EFFEXOR-XR) 75 MG 24 hr capsule Take 1 capsule (75 mg total) by mouth daily with breakfast. 90 capsule 1   No facility-administered medications prior to visit.    ROS: Review of Systems  Constitutional:  Negative for activity change, appetite change, chills, fatigue and unexpected weight change.  HENT:  Negative for congestion, mouth sores and sinus pressure.   Eyes:  Negative for visual disturbance.  Respiratory:  Negative for cough and chest tightness.   Gastrointestinal:  Negative for abdominal pain and nausea.  Genitourinary:  Negative for difficulty urinating, frequency and vaginal pain.  Musculoskeletal:  Negative for back pain and gait problem.  Skin:  Negative for pallor and rash.  Neurological:  Negative for dizziness, tremors, weakness, numbness and headaches.  Psychiatric/Behavioral:  Negative for confusion, sleep disturbance and suicidal ideas.   R breast pain  Objective:  BP 118/78 (BP Location: Left Arm, Patient Position: Sitting)   Pulse 61   Temp 98.3 F (36.8 C) (Temporal)   Ht 5\' 3"  (  1.6 m)   Wt 185 lb 6.4 oz (84.1 kg)   SpO2 99%   BMI 32.84 kg/m   BP Readings from Last 3 Encounters:  01/09/24 118/78  10/23/23 128/62  09/23/23 118/70    Wt Readings from Last 3 Encounters:  01/09/24 185 lb 6.4 oz (84.1 kg)  10/23/23 188 lb 6.4 oz (85.5 kg)  09/23/23 188 lb (85.3 kg)    Physical Exam Constitutional:      General: She is not in acute distress.    Appearance: She is well-developed.  HENT:     Head: Normocephalic.     Right Ear: External ear normal.     Left Ear: External ear normal.     Nose: Nose normal.  Eyes:     General:        Right eye: No discharge.        Left eye: No discharge.      Conjunctiva/sclera: Conjunctivae normal.     Pupils: Pupils are equal, round, and reactive to light.  Neck:     Thyroid: No thyromegaly.     Vascular: No JVD.     Trachea: No tracheal deviation.  Cardiovascular:     Rate and Rhythm: Normal rate and regular rhythm.     Heart sounds: Normal heart sounds.  Pulmonary:     Effort: No respiratory distress.     Breath sounds: No stridor. No wheezing.  Abdominal:     General: Bowel sounds are normal. There is no distension.     Palpations: Abdomen is soft. There is no mass.     Tenderness: There is no abdominal tenderness. There is no guarding or rebound.  Musculoskeletal:        General: No tenderness.     Cervical back: Normal range of motion and neck supple. No rigidity.  Lymphadenopathy:     Cervical: No cervical adenopathy.  Skin:    Findings: No erythema or rash.  Neurological:     Mental Status: She is oriented to person, place, and time.     Cranial Nerves: No cranial nerve deficit.     Motor: No abnormal muscle tone.     Coordination: Coordination normal.     Deep Tendon Reflexes: Reflexes normal.  Psychiatric:        Behavior: Behavior normal.        Thought Content: Thought content normal.        Judgment: Judgment normal.     Lab Results  Component Value Date   WBC 5.9 09/23/2023   HGB 12.5 09/23/2023   HCT 37.6 09/23/2023   PLT 177.0 09/23/2023   GLUCOSE 106 (H) 09/23/2023   CHOL 131 04/20/2022   TRIG 82.0 04/20/2022   HDL 51.70 04/20/2022   LDLDIRECT 153.6 07/06/2013   LDLCALC 63 04/20/2022   ALT 11 09/23/2023   AST 18 09/23/2023   NA 142 09/23/2023   K 4.5 09/23/2023   CL 104 09/23/2023   CREATININE 1.19 09/23/2023   BUN 19 09/23/2023   CO2 29 09/23/2023   TSH 3.36 09/23/2023   INR 1.1 (H) 09/23/2023   HGBA1C 5.9 09/23/2023    CT Chest Wo Contrast Result Date: 11/18/2023 CLINICAL DATA:  Follow-up lung nodule EXAM: CT CHEST WITHOUT CONTRAST TECHNIQUE: Multidetector CT imaging of the chest was  performed following the standard protocol without IV contrast. RADIATION DOSE REDUCTION: This exam was performed according to the departmental dose-optimization program which includes automated exposure control, adjustment of the mA and/or kV according to patient  size and/or use of iterative reconstruction technique. COMPARISON:  CT chest 06/18/2023 FINDINGS: Cardiovascular: Normal heart size. No pericardial effusion. Coronary artery and aortic atherosclerotic calcification. Mediastinum/Nodes: Trachea and esophagus are unremarkable. No thoracic adenopathy. Lungs/Pleura: No focal consolidation, pleural effusion, or pneumothorax. 8 mm nodule in the right middle lobe is unchanged. This is stable dating back to 10/11/2021 and compatible with benign etiology. Given 2 year stability no additional follow-up is recommended. Upper Abdomen: No acute abnormality. Musculoskeletal: No acute fracture. IMPRESSION: 8 mm nodule in the right middle lobe is unchanged dating back to 10/11/2021 and compatible with benign etiology. Given 2 year stability no additional follow-up is recommended. Electronically Signed   By: Minerva Fester M.D.   On: 11/18/2023 23:58    Assessment & Plan:   Problem List Items Addressed This Visit     Vitamin D deficiency   On Vit D      Anxiety disorder   On Effexor XR Lorazepam prn  Potential benefits of a long term benzodiazepines  use as well as potential risks  and complications were explained to the patient and were aknowledged.      Essential hypertension   Reduced Micardis to 40 mg/day due to low BP      Breast pain in female - Primary   R breast pain - new in 11/2023 R breast US      Relevant Orders   MM 3D DIAGNOSTIC MAMMOGRAM UNILATERAL RIGHT BREAST      No orders of the defined types were placed in this encounter.     Follow-up: Return in about 3 months (around 04/07/2024) for a follow-up visit.  Sonda Primes, MD

## 2024-01-10 ENCOUNTER — Other Ambulatory Visit: Payer: Self-pay | Admitting: Internal Medicine

## 2024-01-10 DIAGNOSIS — N644 Mastodynia: Secondary | ICD-10-CM

## 2024-01-20 NOTE — Assessment & Plan Note (Signed)
 On Effexor XR Lorazepam prn  Potential benefits of a long term benzodiazepines  use as well as potential risks  and complications were explained to the patient and were aknowledged.

## 2024-01-20 NOTE — Assessment & Plan Note (Signed)
Reduced Micardis to 40 mg/day due to low BP

## 2024-01-31 ENCOUNTER — Encounter: Payer: Self-pay | Admitting: Gastroenterology

## 2024-02-04 DIAGNOSIS — Z961 Presence of intraocular lens: Secondary | ICD-10-CM | POA: Diagnosis not present

## 2024-02-04 DIAGNOSIS — H43393 Other vitreous opacities, bilateral: Secondary | ICD-10-CM | POA: Diagnosis not present

## 2024-02-04 DIAGNOSIS — H04123 Dry eye syndrome of bilateral lacrimal glands: Secondary | ICD-10-CM | POA: Diagnosis not present

## 2024-02-12 ENCOUNTER — Ambulatory Visit
Admission: RE | Admit: 2024-02-12 | Discharge: 2024-02-12 | Disposition: A | Discharge status: Home / Self Care | Payer: Medicare HMO | Source: Ambulatory Visit | Attending: Internal Medicine | Admitting: Internal Medicine

## 2024-02-12 DIAGNOSIS — N644 Mastodynia: Secondary | ICD-10-CM

## 2024-03-25 ENCOUNTER — Telehealth: Payer: Self-pay

## 2024-03-25 NOTE — Telephone Encounter (Addendum)
 This patient is appearing on a report for being at risk of failing the adherence measure for hypertension (ACEi/ARB) medications this calendar year.   Medication: telmisartan  40 mg daily Last fill date (per Dr. Anson Basta): 12/28/23 for 41 day supply  Spoke with patient who stated she has enough medication at home but is close to running out. Called pharmacy to prepare fill. No further action needed.  Abelina Abide, PharmD PGY1 Pharmacy Resident 04/03/2024 11:44 AM

## 2024-03-30 ENCOUNTER — Other Ambulatory Visit: Payer: Self-pay

## 2024-03-31 ENCOUNTER — Encounter: Payer: Self-pay | Admitting: Internal Medicine

## 2024-03-31 ENCOUNTER — Ambulatory Visit (INDEPENDENT_AMBULATORY_CARE_PROVIDER_SITE_OTHER): Payer: Medicare HMO | Admitting: Internal Medicine

## 2024-03-31 VITALS — BP 138/70 | HR 59 | Temp 98.3°F | Ht 63.0 in | Wt 186.6 lb

## 2024-03-31 DIAGNOSIS — N183 Chronic kidney disease, stage 3 unspecified: Secondary | ICD-10-CM

## 2024-03-31 DIAGNOSIS — R1031 Right lower quadrant pain: Secondary | ICD-10-CM | POA: Diagnosis not present

## 2024-03-31 DIAGNOSIS — E538 Deficiency of other specified B group vitamins: Secondary | ICD-10-CM

## 2024-03-31 DIAGNOSIS — R109 Unspecified abdominal pain: Secondary | ICD-10-CM | POA: Insufficient documentation

## 2024-03-31 DIAGNOSIS — I10 Essential (primary) hypertension: Secondary | ICD-10-CM

## 2024-03-31 LAB — CBC WITH DIFFERENTIAL/PLATELET
Basophils Absolute: 0 10*3/uL (ref 0.0–0.1)
Basophils Relative: 0.9 % (ref 0.0–3.0)
Eosinophils Absolute: 0.2 10*3/uL (ref 0.0–0.7)
Eosinophils Relative: 4.1 % (ref 0.0–5.0)
HCT: 38.4 % (ref 36.0–46.0)
Hemoglobin: 12.4 g/dL (ref 12.0–15.0)
Lymphocytes Relative: 42.2 % (ref 12.0–46.0)
Lymphs Abs: 2.2 10*3/uL (ref 0.7–4.0)
MCHC: 32.3 g/dL (ref 30.0–36.0)
MCV: 78.8 fl (ref 78.0–100.0)
Monocytes Absolute: 0.7 10*3/uL (ref 0.1–1.0)
Monocytes Relative: 13.2 % — ABNORMAL HIGH (ref 3.0–12.0)
Neutro Abs: 2.1 10*3/uL (ref 1.4–7.7)
Neutrophils Relative %: 39.6 % — ABNORMAL LOW (ref 43.0–77.0)
Platelets: 193 10*3/uL (ref 150.0–400.0)
RBC: 4.87 Mil/uL (ref 3.87–5.11)
RDW: 14.6 % (ref 11.5–15.5)
WBC: 5.3 10*3/uL (ref 4.0–10.5)

## 2024-03-31 LAB — URINALYSIS, ROUTINE W REFLEX MICROSCOPIC
Bilirubin Urine: NEGATIVE
Hgb urine dipstick: NEGATIVE
Ketones, ur: NEGATIVE
Nitrite: NEGATIVE
RBC / HPF: NONE SEEN (ref 0–?)
Specific Gravity, Urine: 1.01 (ref 1.000–1.030)
Total Protein, Urine: NEGATIVE
Urine Glucose: NEGATIVE
Urobilinogen, UA: 0.2 (ref 0.0–1.0)
pH: 7 (ref 5.0–8.0)

## 2024-03-31 LAB — COMPREHENSIVE METABOLIC PANEL WITH GFR
ALT: 12 U/L (ref 0–35)
AST: 21 U/L (ref 0–37)
Albumin: 4.6 g/dL (ref 3.5–5.2)
Alkaline Phosphatase: 78 U/L (ref 39–117)
BUN: 14 mg/dL (ref 6–23)
CO2: 28 meq/L (ref 19–32)
Calcium: 9.8 mg/dL (ref 8.4–10.5)
Chloride: 102 meq/L (ref 96–112)
Creatinine, Ser: 1.07 mg/dL (ref 0.40–1.20)
GFR: 51.1 mL/min — ABNORMAL LOW (ref >60.00)
Glucose, Bld: 103 mg/dL — ABNORMAL HIGH (ref 70–99)
Potassium: 4.2 meq/L (ref 3.5–5.1)
Sodium: 140 meq/L (ref 135–145)
Total Bilirubin: 0.6 mg/dL (ref 0.2–1.2)
Total Protein: 7.9 g/dL (ref 6.0–8.3)

## 2024-03-31 MED ORDER — LORAZEPAM 1 MG PO TABS: ORAL_TABLET | ORAL | 2 refills | Status: DC

## 2024-03-31 MED ORDER — ONDANSETRON HCL 4 MG PO TABS: 4.0000 mg | ORAL_TABLET | Freq: Three times a day (TID) | ORAL | 0 refills | Status: AC | PRN

## 2024-03-31 NOTE — Assessment & Plan Note (Signed)
 Monitor labs Avoid NSAIDs.  Hydrate well before and after CT

## 2024-03-31 NOTE — Progress Notes (Signed)
 Subjective:  Patient ID: Jane Mooney, female    DOB: [birth year]  Age: 73 y.o. MRN: [id number]  CC: Medical Management of Chronic Issues (3 Month follow up. Notes still having problems with right knee, was supposed to be scheduled for surgery but was never contacted. Interested in getting cartilage injection instead)   HPI Jane Mooney presents for 3 Month follow up. Notes still having problems with right knee, was supposed to be scheduled for surgery but was never contacted. Interested in getting cartilage injection instead.  C/o RLQ pain x 2 weeks: dull pain comes and goes 4/10, some nausea   F/u on HTN, anxiety   Outpatient Medications Prior to Visit  Medication Sig Dispense Refill   acetaminophen  (TYLENOL ) 500 MG tablet Take 1,000 mg by mouth every 6 (six) hours as needed for moderate pain or headache.     amLODipine  (NORVASC ) 2.5 MG tablet Take 1 tablet (2.5 mg total) by mouth daily. 90 tablet 3   aspirin  EC 81 MG tablet Take 81 mg by mouth daily. Swallow whole.     cholecalciferol (VITAMIN D ) 1000 units tablet Take 2 tablets (2,000 Units total) by mouth daily. (Patient taking differently: Take 1,000 Units by mouth daily.) 100 tablet 11   Cyanocobalamin  (VITAMIN B-12) 1000 MCG SUBL Place 1 tablet (1,000 mcg total) under the tongue daily. 100 tablet 3   famotidine  (PEPCID ) 20 MG tablet Take 1 tablet (20 mg total) by mouth daily. 90 tablet 2   fish oil-omega-3 fatty acids 1000 MG capsule Take 1 g by mouth daily.     fluticasone  (FLONASE ) 50 MCG/ACT nasal spray Place 1 spray into both nostrils daily as needed for allergies. 48 g 3   hydrOXYzine  (ATARAX ) 25 MG tablet Take 1-2 tablets at night as needed for itching. Caution as this medication can make you drowsy 180 tablet 3   ipratropium (ATROVENT ) 0.03 % nasal spray Place 2 sprays into both nostrils 4 (four) times daily. 90 mL 1   loratadine  (CLARITIN ) 10 MG tablet Take 1 tablet by mouth daily.     Probiotic Product (ALIGN) 4 MG  CAPS Take 1 capsule (4 mg total) by mouth daily. 30 capsule 1   rosuvastatin  (CRESTOR ) 5 MG tablet Take 1 tablet (5 mg total) by mouth daily. 90 tablet 3   telmisartan  (MICARDIS ) 40 MG tablet Take 1 tablet (40 mg total) by mouth daily. 90 tablet 3   triamcinolone  ointment (KENALOG ) 0.1 % APPLY 1 APPLICATION TOPICALLY 4 TIMES A DAY 80 g 1   triamterene -hydrochlorothiazide (MAXZIDE-25) 37.5-25 MG tablet Take 0.5 tablets by mouth daily. 90 tablet 3   venlafaxine  XR (EFFEXOR -XR) 75 MG 24 hr capsule Take 1 capsule (75 mg total) by mouth daily with breakfast. 90 capsule 1   LORazepam  (ATIVAN ) 1 MG tablet TAKE 1 TABLET BY MOUTH TWICE A DAY AS NEEDED FOR ANXIETY 60 tablet 2   No facility-administered medications prior to visit.    ROS: Review of Systems  Constitutional:  Negative for activity change, appetite change, chills, diaphoresis, fatigue and unexpected weight change.  HENT:  Negative for congestion, mouth sores and sinus pressure.   Eyes:  Negative for visual disturbance.  Respiratory:  Negative for cough and chest tightness.   Gastrointestinal:  Positive for abdominal pain and nausea.  Genitourinary:  Negative for difficulty urinating, frequency and vaginal pain.  Musculoskeletal:  Positive for arthralgias and gait problem. Negative for back pain.  Skin:  Negative for pallor and rash.  Neurological:  Negative  for dizziness, tremors, weakness, numbness and headaches.  Psychiatric/Behavioral:  Negative for confusion and sleep disturbance.     Objective:  BP 138/70   Pulse (!) 59   Temp 98.3 F (36.8 C)   Ht 5\' 3"  (1.6 m)   Wt 186 lb 9.6 oz (84.6 kg)   SpO2 98%   BMI 33.05 kg/m   BP Readings from Last 3 Encounters:  03/31/24 138/70  01/09/24 118/78  10/23/23 128/62    Wt Readings from Last 3 Encounters:  03/31/24 186 lb 9.6 oz (84.6 kg)  01/09/24 185 lb 6.4 oz (84.1 kg)  10/23/23 188 lb 6.4 oz (85.5 kg)    Physical Exam Constitutional:      General: She is not in acute  distress.    Appearance: She is well-developed.  HENT:     Head: Normocephalic.     Right Ear: External ear normal.     Left Ear: External ear normal.     Nose: Nose normal.  Eyes:     General:        Right eye: No discharge.        Left eye: No discharge.     Conjunctiva/sclera: Conjunctivae normal.     Pupils: Pupils are equal, round, and reactive to light.  Neck:     Thyroid : No thyromegaly.     Vascular: No JVD.     Trachea: No tracheal deviation.  Cardiovascular:     Rate and Rhythm: Normal rate and regular rhythm.     Heart sounds: Normal heart sounds.  Pulmonary:     Effort: No respiratory distress.     Breath sounds: No stridor. No wheezing.  Abdominal:     General: Bowel sounds are normal. There is no distension.     Palpations: Abdomen is soft. There is no mass.     Tenderness: There is no abdominal tenderness. There is no guarding or rebound.  Musculoskeletal:        General: No tenderness.     Cervical back: Normal range of motion and neck supple. No rigidity.  Lymphadenopathy:     Cervical: No cervical adenopathy.  Skin:    Findings: No erythema or rash.  Neurological:     Cranial Nerves: No cranial nerve deficit.     Motor: No abnormal muscle tone.     Coordination: Coordination normal.     Deep Tendon Reflexes: Reflexes normal.  Psychiatric:        Behavior: Behavior normal.        Thought Content: Thought content normal.        Judgment: Judgment normal.   RLQ w/pain on palpation, (+/-) rebound, no mass Antalgic gait  Lab Results  Component Value Date   WBC 5.9 09/23/2023   HGB 12.5 09/23/2023   HCT 37.6 09/23/2023   PLT 177.0 09/23/2023   GLUCOSE 106 (H) 09/23/2023   CHOL 131 04/20/2022   TRIG 82.0 04/20/2022   HDL 51.70 04/20/2022   LDLDIRECT 153.6 07/06/2013   LDLCALC 63 04/20/2022   ALT 11 09/23/2023   AST 18 09/23/2023   NA 142 09/23/2023   K 4.5 09/23/2023   CL 104 09/23/2023   CREATININE 1.19 09/23/2023   BUN 19 09/23/2023   CO2  29 09/23/2023   TSH 3.36 09/23/2023   INR 1.1 (H) 09/23/2023   HGBA1C 5.9 09/23/2023    MM 3D DIAGNOSTIC MAMMOGRAM UNILATERAL RIGHT BREAST Result Date: 02/12/2024 CLINICAL DATA:  Patient complains of focal right breast pain. EXAM: DIGITAL DIAGNOSTIC  UNILATERAL RIGHT MAMMOGRAM WITH TOMOSYNTHESIS AND CAD; ULTRASOUND RIGHT BREAST LIMITED TECHNIQUE: Right digital diagnostic mammography and breast tomosynthesis was performed. The images were evaluated with computer-aided detection. ; Targeted ultrasound examination of the right breast was performed COMPARISON:  Previous exam(s). ACR Breast Density Category b: There are scattered areas of fibroglandular density. FINDINGS: No suspicious mass, malignant type microcalcifications or distortion detected. Spot tangential view of the area of clinical concern shows normal fibroglandular tissue. On physical exam, I do not palpate a mass in the patient's area of focal pain in the right breast at 9 o'clock 8 cm from the nipple. Targeted ultrasound is performed, showing normal tissue in the patient's area of clinical concern in the 9 o'clock region of the right breast 8 cm from the nipple. IMPRESSION: No evidence of malignancy in the right breast. RECOMMENDATION: Bilateral screening mammogram in December of 2025 is recommended. I have discussed the findings and recommendations with the patient. If applicable, a reminder letter will be sent to the patient regarding the next appointment. BI-RADS CATEGORY  1: Negative. Electronically Signed   By: Dina  Arceo M.D.   On: 02/12/2024 09:04   US  LIMITED ULTRASOUND INCLUDING AXILLA RIGHT BREAST Result Date: 02/12/2024 CLINICAL DATA:  Patient complains of focal right breast pain. EXAM: DIGITAL DIAGNOSTIC UNILATERAL RIGHT MAMMOGRAM WITH TOMOSYNTHESIS AND CAD; ULTRASOUND RIGHT BREAST LIMITED TECHNIQUE: Right digital diagnostic mammography and breast tomosynthesis was performed. The images were evaluated with computer-aided detection. ;  Targeted ultrasound examination of the right breast was performed COMPARISON:  Previous exam(s). ACR Breast Density Category b: There are scattered areas of fibroglandular density. FINDINGS: No suspicious mass, malignant type microcalcifications or distortion detected. Spot tangential view of the area of clinical concern shows normal fibroglandular tissue. On physical exam, I do not palpate a mass in the patient's area of focal pain in the right breast at 9 o'clock 8 cm from the nipple. Targeted ultrasound is performed, showing normal tissue in the patient's area of clinical concern in the 9 o'clock region of the right breast 8 cm from the nipple. IMPRESSION: No evidence of malignancy in the right breast. RECOMMENDATION: Bilateral screening mammogram in December of 2025 is recommended. I have discussed the findings and recommendations with the patient. If applicable, a reminder letter will be sent to the patient regarding the next appointment. BI-RADS CATEGORY  1: Negative. Electronically Signed   By: Dina  Arceo M.D.   On: 02/12/2024 09:04    Assessment & Plan:   Problem List Items Addressed This Visit     Essential hypertension   Micardis  40 mg/day      Relevant Orders   Comprehensive metabolic panel with GFR   Vitamin B 12 deficiency   On B12      CRI (chronic renal insufficiency), stage 3 (moderate) (HCC)   Monitor labs Avoid NSAIDs.  Hydrate well before and after CT      RLQ abdominal pain - Primary   New x 2 weeks R/o appendicitis Labs - CBC, CMET CT abd Last colon - 2015      Relevant Orders   CT ABDOMEN PELVIS W CONTRAST   Comprehensive metabolic panel with GFR   CBC with Differential/Platelet   Urinalysis   Other Visit Diagnoses       Right lower quadrant abdominal pain             Meds ordered this encounter  Medications   ondansetron  (ZOFRAN ) 4 MG tablet    Sig: Take 1 tablet (4 mg  total) by mouth every 8 (eight) hours as needed for nausea or vomiting.     Dispense:  20 tablet    Refill:  0   LORazepam  (ATIVAN ) 1 MG tablet    Sig: TAKE 1 TABLET BY MOUTH TWICE A DAY AS NEEDED FOR ANXIETY    Dispense:  60 tablet    Refill:  2      Follow-up: Return in about 2 weeks (around 04/14/2024) for a follow-up visit.  Anitra Barn, MD

## 2024-03-31 NOTE — Assessment & Plan Note (Signed)
 On B12

## 2024-03-31 NOTE — Assessment & Plan Note (Signed)
 Micardis  40 mg/day

## 2024-03-31 NOTE — Assessment & Plan Note (Signed)
 New x 2 weeks R/o appendicitis Labs - CBC, CMET CT abd Last colon - 2015

## 2024-04-01 ENCOUNTER — Ambulatory Visit
Admission: RE | Admit: 2024-04-01 | Discharge: 2024-04-01 | Disposition: A | Discharge status: Home / Self Care | Source: Ambulatory Visit | Attending: Internal Medicine | Admitting: Internal Medicine

## 2024-04-01 ENCOUNTER — Ambulatory Visit: Payer: Self-pay | Admitting: Internal Medicine

## 2024-04-01 DIAGNOSIS — R1031 Right lower quadrant pain: Secondary | ICD-10-CM

## 2024-04-01 DIAGNOSIS — R110 Nausea: Secondary | ICD-10-CM | POA: Diagnosis not present

## 2024-04-01 MED ORDER — IOPAMIDOL (ISOVUE-300) INJECTION 61%
100.0000 mL | Freq: Once | INTRAVENOUS | Status: AC | PRN
  Administered 2024-04-01: 100 mL via INTRAVENOUS

## 2024-04-14 ENCOUNTER — Ambulatory Visit: Admitting: Internal Medicine

## 2024-04-15 ENCOUNTER — Ambulatory Visit (INDEPENDENT_AMBULATORY_CARE_PROVIDER_SITE_OTHER): Admitting: Internal Medicine

## 2024-04-15 ENCOUNTER — Encounter: Payer: Self-pay | Admitting: Internal Medicine

## 2024-04-15 VITALS — BP 118/70 | HR 64 | Temp 98.2°F | Ht 63.0 in | Wt 185.0 lb

## 2024-04-15 DIAGNOSIS — I10 Essential (primary) hypertension: Secondary | ICD-10-CM | POA: Diagnosis not present

## 2024-04-15 DIAGNOSIS — E559 Vitamin D deficiency, unspecified: Secondary | ICD-10-CM | POA: Diagnosis not present

## 2024-04-15 DIAGNOSIS — F4321 Adjustment disorder with depressed mood: Secondary | ICD-10-CM

## 2024-04-15 DIAGNOSIS — R1084 Generalized abdominal pain: Secondary | ICD-10-CM | POA: Diagnosis not present

## 2024-04-15 DIAGNOSIS — F419 Anxiety disorder, unspecified: Secondary | ICD-10-CM

## 2024-04-15 MED ORDER — PANTOPRAZOLE SODIUM 40 MG PO TBEC: 40.0000 mg | DELAYED_RELEASE_TABLET | Freq: Two times a day (BID) | ORAL | 5 refills | Status: DC

## 2024-04-15 MED ORDER — PHENTERMINE HCL 37.5 MG PO TABS: 37.5000 mg | ORAL_TABLET | Freq: Every day | ORAL | 2 refills | Status: DC

## 2024-04-15 NOTE — Progress Notes (Addendum)
 Subjective:  Patient ID: Jane Mooney, female    DOB: April 15, 1951  Age: 73 y.o. MRN: [id number]  CC: Medical Management of Chronic Issues (2 WEEK F/U )   HPI Jane Mooney presents for RLQ and epigastric pain C/o wt C/o gastric pain  Outpatient Medications Prior to Visit  Medication Sig Dispense Refill   acetaminophen  (TYLENOL ) 500 MG tablet Take 1,000 mg by mouth every 6 (six) hours as needed for moderate pain or headache.     amLODipine  (NORVASC ) 2.5 MG tablet Take 1 tablet (2.5 mg total) by mouth daily. 90 tablet 3   aspirin  EC 81 MG tablet Take 81 mg by mouth daily. Swallow whole.     cholecalciferol (VITAMIN D ) 1000 units tablet Take 2 tablets (2,000 Units total) by mouth daily. (Patient taking differently: Take 1,000 Units by mouth daily.) 100 tablet 11   Cyanocobalamin  (VITAMIN B-12) 1000 MCG SUBL Place 1 tablet (1,000 mcg total) under the tongue daily. 100 tablet 3   famotidine  (PEPCID ) 20 MG tablet Take 1 tablet (20 mg total) by mouth daily. 90 tablet 2   fish oil-omega-3 fatty acids 1000 MG capsule Take 1 g by mouth daily.     fluticasone  (FLONASE ) 50 MCG/ACT nasal spray Place 1 spray into both nostrils daily as needed for allergies. 48 g 3   hydrOXYzine  (ATARAX ) 25 MG tablet Take 1-2 tablets at night as needed for itching. Caution as this medication can make you drowsy 180 tablet 3   ipratropium (ATROVENT ) 0.03 % nasal spray Place 2 sprays into both nostrils 4 (four) times daily. 90 mL 1   loratadine  (CLARITIN ) 10 MG tablet Take 1 tablet by mouth daily.     LORazepam  (ATIVAN ) 1 MG tablet TAKE 1 TABLET BY MOUTH TWICE A DAY AS NEEDED FOR ANXIETY 60 tablet 2   ondansetron  (ZOFRAN ) 4 MG tablet Take 1 tablet (4 mg total) by mouth every 8 (eight) hours as needed for nausea or vomiting. 20 tablet 0   Probiotic Product (ALIGN) 4 MG CAPS Take 1 capsule (4 mg total) by mouth daily. 30 capsule 1   rosuvastatin  (CRESTOR ) 5 MG tablet Take 1 tablet (5 mg total) by mouth daily. 90 tablet 3    telmisartan  (MICARDIS ) 40 MG tablet Take 1 tablet (40 mg total) by mouth daily. 90 tablet 3   triamcinolone  ointment (KENALOG ) 0.1 % APPLY 1 APPLICATION TOPICALLY 4 TIMES A DAY 80 g 1   triamterene -hydrochlorothiazide (MAXZIDE-25) 37.5-25 MG tablet Take 0.5 tablets by mouth daily. 90 tablet 3   venlafaxine  XR (EFFEXOR -XR) 75 MG 24 hr capsule Take 1 capsule (75 mg total) by mouth daily with breakfast. 90 capsule 1   No facility-administered medications prior to visit.    ROS: Review of Systems  Constitutional:  Negative for activity change, appetite change, chills, fatigue and unexpected weight change.  HENT:  Negative for congestion, mouth sores and sinus pressure.   Eyes:  Negative for visual disturbance.  Respiratory:  Negative for cough and chest tightness.   Cardiovascular:  Negative for leg swelling.  Gastrointestinal:  Positive for abdominal pain. Negative for abdominal distention, diarrhea, nausea and vomiting.  Genitourinary:  Negative for difficulty urinating, frequency and vaginal pain.  Musculoskeletal:  Positive for arthralgias and gait problem. Negative for back pain.  Skin:  Negative for pallor and rash.  Neurological:  Negative for dizziness, tremors, weakness, numbness and headaches.  Psychiatric/Behavioral:  Negative for confusion and sleep disturbance.     Objective:  BP 118/70  Pulse 64   Temp 98.2 F (36.8 C) (Oral)   Ht 5' 3 (1.6 m)   Wt 185 lb (83.9 kg)   SpO2 98%   BMI 32.77 kg/m   BP Readings from Last 3 Encounters:  04/15/24 118/70  03/31/24 138/70  01/09/24 118/78    Wt Readings from Last 3 Encounters:  04/15/24 185 lb (83.9 kg)  03/31/24 186 lb 9.6 oz (84.6 kg)  01/09/24 185 lb 6.4 oz (84.1 kg)    Physical Exam Constitutional:      General: She is not in acute distress.    Appearance: She is well-developed. She is obese.  HENT:     Head: Normocephalic.     Right Ear: External ear normal.     Left Ear: External ear normal.     Nose:  Nose normal.   Eyes:     General:        Right eye: No discharge.        Left eye: No discharge.     Conjunctiva/sclera: Conjunctivae normal.     Pupils: Pupils are equal, round, and reactive to light.   Neck:     Thyroid : No thyromegaly.     Vascular: No JVD.     Trachea: No tracheal deviation.   Cardiovascular:     Rate and Rhythm: Normal rate and regular rhythm.     Heart sounds: Normal heart sounds.  Pulmonary:     Effort: No respiratory distress.     Breath sounds: No stridor. No wheezing.  Abdominal:     General: Bowel sounds are normal. There is no distension.     Palpations: Abdomen is soft. There is no mass.     Tenderness: There is abdominal tenderness. There is no guarding or rebound.   Musculoskeletal:        General: Tenderness present.     Cervical back: Normal range of motion and neck supple. No rigidity.  Lymphadenopathy:     Cervical: No cervical adenopathy.   Skin:    Findings: No erythema or rash.   Neurological:     Mental Status: She is oriented to person, place, and time.     Cranial Nerves: No cranial nerve deficit.     Motor: No abnormal muscle tone.     Coordination: Coordination normal.     Deep Tendon Reflexes: Reflexes normal.   Psychiatric:        Behavior: Behavior normal.        Thought Content: Thought content normal.        Judgment: Judgment normal.   Upper one half abdomen is sensitive to palpation.  No mass.  No rebound  Lab Results  Component Value Date   WBC 5.3 03/31/2024   HGB 12.4 03/31/2024   HCT 38.4 03/31/2024   PLT 193.0 03/31/2024   GLUCOSE 103 (H) 03/31/2024   CHOL 131 04/20/2022   TRIG 82.0 04/20/2022   HDL 51.70 04/20/2022   LDLDIRECT 153.6 07/06/2013   LDLCALC 63 04/20/2022   ALT 12 03/31/2024   AST 21 03/31/2024   NA 140 03/31/2024   K 4.2 03/31/2024   CL 102 03/31/2024   CREATININE 1.07 03/31/2024   BUN 14 03/31/2024   CO2 28 03/31/2024   TSH 3.36 09/23/2023   INR 1.1 (H) 09/23/2023   HGBA1C 5.9  09/23/2023    CT ABDOMEN PELVIS W CONTRAST Result Date: 04/01/2024 CLINICAL DATA:  RIGHT lower quadrant abdominal pain for 2 weeks. Nausea. EXAM: CT ABDOMEN AND PELVIS WITH  CONTRAST TECHNIQUE: Multidetector CT imaging of the abdomen and pelvis was performed using the standard protocol following bolus administration of intravenous contrast. RADIATION DOSE REDUCTION: This exam was performed according to the departmental dose-optimization program which includes automated exposure control, adjustment of the mA and/or kV according to patient size and/or use of iterative reconstruction technique. CONTRAST:  ISOVUE -300 IOPAMIDOL  (ISOVUE -300) INJECTION 61% COMPARISON:  None available FINDINGS: Lower chest: Partially visualized 8 mm nodule in the RIGHT middle lobe is unchanged since 10/11/2021, consistent with benign etiology. No acute abnormality of the lung bases. Hepatobiliary: No focal liver abnormality is seen. No gallstones, gallbladder wall thickening, or biliary dilatation. Pancreas: Unremarkable. No pancreatic ductal dilatation or surrounding inflammatory changes. Spleen: Normal in size without focal abnormality. Adrenals/Urinary Tract: Adrenal glands are normal. 3.9 cm simple cyst in the lower pole the RIGHT kidney does not require dedicated imaging follow-up. Kidneys, ureters, and bladder otherwise normal. Stomach/Bowel: Stomach is within normal limits. Appendix is not definitively visualized. No evidence of bowel wall thickening, distention, or inflammatory changes. Vascular/Lymphatic: Aortic atherosclerosis. No enlarged abdominal or pelvic lymph nodes. Reproductive: Status post hysterectomy. No adnexal masses. Other: Small fat containing umbilical hernia. Musculoskeletal: No acute or significant osseous findings. IMPRESSION: No acute abnormality of the abdomen or pelvis. Electronically Signed   By: Elester Grim M.D.   On: 04/01/2024 13:31    Assessment & Plan:   Problem List Items Addressed This  Visit     Vitamin D  deficiency   Continue vitamin D       Anxiety disorder   On Effexor  XR Lorazepam  prn  Potential benefits of a long term benzodiazepines  use as well as potential risks  and complications were explained to the patient and were aknowledged.      Situational depression   Continue on Effexor  75 mg/d      Essential hypertension   Micardis  40 mg/day      Abdominal pain - Primary   Abdominal pain: Unclear etiology.  Probable IBS.  Probable constipation-treat constipation.  Abdominal CT report was reviewed with the patient Increase pantoprazole  to twice a day         Meds ordered this encounter  Medications   pantoprazole  (PROTONIX ) 40 MG tablet    Sig: Take 1 tablet (40 mg total) by mouth 2 (two) times daily.    Dispense:  60 tablet    Refill:  5   phentermine  (ADIPEX-P ) 37.5 MG tablet    Sig: Take 1 tablet (37.5 mg total) by mouth daily before breakfast.    Dispense:  30 tablet    Refill:  2      Follow-up: Return in about 3 months (around 07/16/2024) for a follow-up visit.  Anitra Barn, MD

## 2024-04-15 NOTE — Patient Instructions (Signed)
Stop Crestor for now

## 2024-04-22 ENCOUNTER — Other Ambulatory Visit (HOSPITAL_BASED_OUTPATIENT_CLINIC_OR_DEPARTMENT_OTHER)

## 2024-04-22 ENCOUNTER — Ambulatory Visit (HOSPITAL_BASED_OUTPATIENT_CLINIC_OR_DEPARTMENT_OTHER)
Admission: RE | Admit: 2024-04-22 | Discharge: 2024-04-22 | Disposition: A | Discharge status: Home / Self Care | Source: Ambulatory Visit | Attending: Internal Medicine | Admitting: Internal Medicine

## 2024-04-22 ENCOUNTER — Encounter (HOSPITAL_BASED_OUTPATIENT_CLINIC_OR_DEPARTMENT_OTHER): Payer: Self-pay

## 2024-04-22 ENCOUNTER — Other Ambulatory Visit: Payer: Medicare HMO

## 2024-04-22 DIAGNOSIS — Z780 Asymptomatic menopausal state: Secondary | ICD-10-CM

## 2024-04-22 DIAGNOSIS — Z0000 Encounter for general adult medical examination without abnormal findings: Secondary | ICD-10-CM

## 2024-04-26 NOTE — Assessment & Plan Note (Signed)
 On Effexor XR Lorazepam prn  Potential benefits of a long term benzodiazepines  use as well as potential risks  and complications were explained to the patient and were aknowledged.

## 2024-04-26 NOTE — Assessment & Plan Note (Signed)
 Micardis  40 mg/day

## 2024-04-26 NOTE — Assessment & Plan Note (Addendum)
 Continue on Effexor  75 mg/d

## 2024-04-26 NOTE — Assessment & Plan Note (Addendum)
 Abdominal pain: Unclear etiology.  Probable IBS.  Probable constipation-treat constipation.  Abdominal CT report was reviewed with the patient Increase pantoprazole  to twice a day

## 2024-04-26 NOTE — Assessment & Plan Note (Signed)
 Continue vitamin D.

## 2024-04-29 ENCOUNTER — Encounter: Payer: Self-pay | Admitting: Internal Medicine

## 2024-05-04 ENCOUNTER — Other Ambulatory Visit: Payer: Self-pay | Admitting: Internal Medicine

## 2024-05-04 DIAGNOSIS — Z860100 Personal history of colon polyps, unspecified: Secondary | ICD-10-CM

## 2024-06-02 ENCOUNTER — Ambulatory Visit (INDEPENDENT_AMBULATORY_CARE_PROVIDER_SITE_OTHER)

## 2024-06-02 ENCOUNTER — Other Ambulatory Visit: Payer: Self-pay

## 2024-06-02 ENCOUNTER — Telehealth: Payer: Self-pay

## 2024-06-02 ENCOUNTER — Ambulatory Visit: Admitting: Family Medicine

## 2024-06-02 VITALS — BP 124/72 | HR 87 | Ht 63.0 in | Wt 184.0 lb

## 2024-06-02 DIAGNOSIS — G8929 Other chronic pain: Secondary | ICD-10-CM | POA: Diagnosis not present

## 2024-06-02 DIAGNOSIS — M25561 Pain in right knee: Secondary | ICD-10-CM

## 2024-06-02 DIAGNOSIS — M1711 Unilateral primary osteoarthritis, right knee: Secondary | ICD-10-CM

## 2024-06-02 NOTE — Telephone Encounter (Signed)
 Please work to [employer] gel shots, RIGHT knee *note these injection will be w/ Dr. Leonce*

## 2024-06-02 NOTE — Patient Instructions (Addendum)
 Thank you for coming in today.   Please get an Xray today before you leave   We will work to authorize gel shots. Once we get approval from your insurance company, you will here from our office about scheduling.

## 2024-06-02 NOTE — Progress Notes (Signed)
   LILLETTE Ileana Collet, PhD, LAT, ATC acting as a scribe for Artist Lloyd, MD.  [name] is a 73 y.o. female who presents to [facility] at [facility] today for R knee pain. Pt was previously seen by Dr. Lloyd in 2021 for bilat knee OA. Hx of L TKR 2021.  Today, pt c/o R knee pain progressively worsening over the last year and a half. She is wondering about the injections she has seen on TV that report to build back bone and cartilage in the knee. She is not interested in undergoing another knee replacement surgery.  Knee swelling: yes- into anterior lower leg Mechanical symptoms: yes Aggravates: down stairs, transitioning to stand Treatments tried: Tylenol , roll-on Lidocaine   Dx imaging: 07/10/22 R & L knee XR  Pertinent review of systems: No fevers or chills.  Relevant historical information: Total left knee replacement history. Patient is caring for her husband who has dementia at home and cannot have surgery now unless it is an absolute emergency.   Exam:  BP 124/72   Pulse 87   Ht 5' 3 (1.6 m)   Wt 184 lb (83.5 kg)   SpO2 99%   BMI 32.59 kg/m  General: Well Developed, well nourished, and in no acute distress.   MSK: Right knee mild effusion normal-appearing otherwise.  Normal motion with crepitation.    Lab and Radiology Results  X-ray images right knee obtained today personally and independently interpreted. Moderate medial compartment DJD. Await formal radiology review    Assessment and Plan: 73 y.o. female with right knee pain due to DJD.  She is interested in trying the gel injections.  Will work on authorization for this and have her schedule with my partner Dr. Leonce to do the gel injection or injections once authorized as I will likely be out of the clinic for medical leave during that time..  Will update x-ray today as it has been quite a while since her last x-ray.  We also spent time talking about genicular artery embolization which we  can organize if the gel injection does not work well.   PDMP not reviewed this encounter. Orders Placed This Encounter  Procedures   DG Knee AP/LAT W/Sunrise Right    Standing Status:   Future    Number of Occurrences:   1    Expiration Date:   07/03/2024    Reason for Exam (SYMPTOM  OR DIAGNOSIS REQUIRED):   right knee pain    Preferred imaging location?:   [facility]   No orders of the defined types were placed in this encounter.    Discussed warning signs or symptoms. Please see discharge instructions. Patient expresses understanding.   The above documentation has been reviewed and is accurate and complete Artist Lloyd, M.D.

## 2024-06-04 ENCOUNTER — Encounter: Payer: Self-pay | Admitting: Internal Medicine

## 2024-06-04 NOTE — Telephone Encounter (Signed)
 Please schedule patient when medication is stocked   Monovisc authorized for right knee Copay $15 Deductible does not apply OOP MAX $6750 has met $200 Once the OOP has been met coverage goes to 100% NO PA REQUIRED Reference number [id number]

## 2024-06-04 NOTE — Telephone Encounter (Signed)
 Ran benefits for monovisc to be done by Dr. Leonce in place of Dr. Joane. Case [phone]

## 2024-06-08 ENCOUNTER — Ambulatory Visit: Payer: Self-pay | Admitting: Family Medicine

## 2024-06-08 NOTE — Progress Notes (Signed)
 Right knee x-ray shows arthritis

## 2024-06-08 NOTE — Telephone Encounter (Signed)
 Schedule 8/5.

## 2024-06-15 NOTE — Progress Notes (Unsigned)
 [facility] [address] [place] [address] Phone: [phone]   Assessment and Plan:    1. Primary osteoarthritis of right knee 2. Chronic pain of right knee -Chronic with exacerbation, subsequent visit - Continued right knee pain consistent with flare of osteoarthritis - Patient elected for HA injection to intra-articular right knee.  Tolerated well per note below - Continue activity as tolerated - Use Tylenol  500 to 1000 mg tablets 2-3 times a day for day-to-day pain relief  -If no significant improvement with HA injection, could consider CSI versus p.o. NSAID course versus prednisone  course versus physical therapy   Procedure: Knee Joint Injection Side: Right Indication: Flare of osteoarthritis  Risks explained and consent was given verbally. The site was cleaned with alcohol prep. A needle was introduced with an anterio-lateral approach. Injection given using Monovisc 88 mg. This was well tolerated.  Needle was removed, hemostasis achieved, and post injection instructions were explained.   Pt was advised to call or return to clinic if these symptoms worsen or fail to improve as anticipated.   Pertinent previous records reviewed include none  Follow Up: As needed.If no significant improvement with HA injection, could consider CSI versus p.o. NSAID course versus prednisone  course versus physical therapy   Subjective:   I, Jane Mooney, am serving as a Neurosurgeon for Doctor Jane Mooney  Chief Complaint: right knee   HPI:   06/02/2024 Jane Mooney is a 73 y.o. female who presents to [facility] at [facility] today for R knee pain. Pt was previously seen by Dr. Joane in 2021 for bilat knee OA. Hx of L TKR 2021.   Today, pt c/o R knee pain progressively worsening over the last year and a half. She is wondering about the injections she has seen on TV that report to build back bone and cartilage in the  knee. She is not interested in undergoing another knee replacement surgery.   Knee swelling: yes- into anterior lower leg Mechanical symptoms: yes Aggravates: down stairs, transitioning to stand Treatments tried: Tylenol , roll-on Lidocaine    Dx imaging: 07/10/22 R & L knee XR   Pertinent review of systems: No fevers or chills.   Relevant historical information: Total left knee replacement history. Patient is caring for her husband who has dementia at home and cannot have surgery now unless it is an absolute emergency.     Exam:  BP 124/72   Pulse 87   Ht 5' 3 (1.6 m)   Wt 184 lb (83.5 kg)   SpO2 99%   BMI 32.59 kg/m  General: Well Developed, well nourished, and in no acute distress.    MSK: Right knee mild effusion normal-appearing otherwise.  Normal motion with crepitation.   06/16/2024 Patient states here for gel injection    Relevant Historical Information: Hypertension, GERD  Additional pertinent review of systems negative.   Current Outpatient Medications:    acetaminophen  (TYLENOL ) 500 MG tablet, Take 1,000 mg by mouth every 6 (six) hours as needed for moderate pain or headache., Disp: , Rfl:    amLODipine  (NORVASC ) 2.5 MG tablet, Take 1 tablet (2.5 mg total) by mouth daily., Disp: 90 tablet, Rfl: 3   aspirin  EC 81 MG tablet, Take 81 mg by mouth daily. Swallow whole., Disp: , Rfl:    cholecalciferol (VITAMIN D ) 1000 units tablet, Take 2 tablets (2,000 Units total) by mouth daily. (Patient taking differently: Take 1,000 Units by mouth daily.), Disp: 100  tablet, Rfl: 11   Cyanocobalamin  (VITAMIN B-12) 1000 MCG SUBL, Place 1 tablet (1,000 mcg total) under the tongue daily., Disp: 100 tablet, Rfl: 3   famotidine  (PEPCID ) 20 MG tablet, Take 1 tablet (20 mg total) by mouth daily., Disp: 90 tablet, Rfl: 2   fish oil-omega-3 fatty acids 1000 MG capsule, Take 1 g by mouth daily., Disp: , Rfl:    fluticasone  (FLONASE ) 50 MCG/ACT nasal spray, Place 1 spray into both nostrils  daily as needed for allergies., Disp: 48 g, Rfl: 3   hydrOXYzine  (ATARAX ) 25 MG tablet, Take 1-2 tablets at night as needed for itching. Caution as this medication can make you drowsy, Disp: 180 tablet, Rfl: 3   ipratropium (ATROVENT ) 0.03 % nasal spray, Place 2 sprays into both nostrils 4 (four) times daily., Disp: 90 mL, Rfl: 1   loratadine  (CLARITIN ) 10 MG tablet, Take 1 tablet by mouth daily., Disp: , Rfl:    LORazepam  (ATIVAN ) 1 MG tablet, TAKE 1 TABLET BY MOUTH TWICE A DAY AS NEEDED FOR ANXIETY, Disp: 60 tablet, Rfl: 2   ondansetron  (ZOFRAN ) 4 MG tablet, Take 1 tablet (4 mg total) by mouth every 8 (eight) hours as needed for nausea or vomiting., Disp: 20 tablet, Rfl: 0   pantoprazole  (PROTONIX ) 40 MG tablet, Take 1 tablet (40 mg total) by mouth 2 (two) times daily., Disp: 60 tablet, Rfl: 5   phentermine  (ADIPEX-P ) 37.5 MG tablet, Take 1 tablet (37.5 mg total) by mouth daily before breakfast., Disp: 30 tablet, Rfl: 2   Probiotic Product (ALIGN) 4 MG CAPS, Take 1 capsule (4 mg total) by mouth daily., Disp: 30 capsule, Rfl: 1   rosuvastatin  (CRESTOR ) 5 MG tablet, Take 1 tablet (5 mg total) by mouth daily., Disp: 90 tablet, Rfl: 3   telmisartan  (MICARDIS ) 40 MG tablet, Take 1 tablet (40 mg total) by mouth daily., Disp: 90 tablet, Rfl: 3   triamcinolone  ointment (KENALOG ) 0.1 %, APPLY 1 APPLICATION TOPICALLY 4 TIMES A DAY, Disp: 80 g, Rfl: 1   triamterene -hydrochlorothiazide (MAXZIDE-25) 37.5-25 MG tablet, Take 0.5 tablets by mouth daily., Disp: 90 tablet, Rfl: 3   venlafaxine  XR (EFFEXOR -XR) 75 MG 24 hr capsule, Take 1 capsule (75 mg total) by mouth daily with breakfast., Disp: 90 capsule, Rfl: 1   Objective:     Vitals:   06/16/24 0917  Pulse: (!) 57  SpO2: 100%  Weight: 184 lb (83.5 kg)  Height: 5' 3 (1.6 m)      Body mass index is 32.59 kg/m.    Physical Exam:    General:  awake, alert oriented, no acute distress nontoxic Skin: no suspicious lesions or rashes Neuro:sensation  intact and strength 5/5 with no deficits, no atrophy, normal muscle tone Psych: No signs of anxiety, depression or other mood disorder  Right knee: No swelling No deformity Neg fluid wave, joint milking ROM Flex 110, Ext 0 TTP medial and lateral joint line NTTP over the quad tendon, medial fem condyle, lat fem condyle, patella, plica, patella tendon, tibial tuberostiy, fibular head, posterior fossa, pes anserine bursa, gerdy's tubercle,   Gait antalgic, favoring left leg   Electronically signed by:  Odis Mooney D.[facility] 9:31 AM 06/16/24

## 2024-06-16 ENCOUNTER — Ambulatory Visit: Admitting: Sports Medicine

## 2024-06-16 VITALS — HR 57 | Ht 63.0 in | Wt 184.0 lb

## 2024-06-16 DIAGNOSIS — G8929 Other chronic pain: Secondary | ICD-10-CM

## 2024-06-16 DIAGNOSIS — M1711 Unilateral primary osteoarthritis, right knee: Secondary | ICD-10-CM | POA: Diagnosis not present

## 2024-06-16 DIAGNOSIS — M25561 Pain in right knee: Secondary | ICD-10-CM | POA: Diagnosis not present

## 2024-06-16 MED ORDER — HYALURONAN 88 MG/4ML IX SOSY
88.0000 mg | PREFILLED_SYRINGE | Freq: Once | INTRA_ARTICULAR | Status: AC
  Administered 2024-06-16: 88 mg via INTRA_ARTICULAR

## 2024-06-16 NOTE — Patient Instructions (Signed)
 As needed follow up  If no improvement 3-4 week follow up

## 2024-07-06 ENCOUNTER — Ambulatory Visit (AMBULATORY_SURGERY_CENTER)

## 2024-07-06 ENCOUNTER — Encounter: Payer: Self-pay | Admitting: Internal Medicine

## 2024-07-06 VITALS — Ht 63.0 in | Wt 182.0 lb

## 2024-07-06 DIAGNOSIS — Z1211 Encounter for screening for malignant neoplasm of colon: Secondary | ICD-10-CM

## 2024-07-06 MED ORDER — NA SULFATE-K SULFATE-MG SULF 17.5-3.13-1.6 GM/177ML PO SOLN: 1.0000 | Freq: Once | ORAL | 0 refills | Status: AC

## 2024-07-06 NOTE — Progress Notes (Signed)
 No egg or soy allergy known to patient  No issues known to pt with past sedation with any surgeries or procedures-   Severe nausea  Patient denies ever being told they had issues or difficulty with intubation  No FH of Malignant Hyperthermia Pt is not on diet pills- YES- PHENTERMINE  Pt is not on  home 02  Pt is not on blood thinners  Pt denies issues with constipation  No A fib or A flutter- IRREGULAR HEART RATE Have any cardiac testing pending--NO Pt can ambulate- INDEPENDENTLY Pt denies use of chewing tobacco Discussed diabetic I weight loss medication holds Discussed NSAID holds Checked BMI Pt instructed to use Single[employer] or GoodRx for a price reduction on prep  Patient's chart reviewed by Norleen Schillings CNRA prior to previsit and patient appropriate for the [facility].  Pre visit completed and red dot placed by patient's name on their procedure day (on provider's schedule).

## 2024-07-16 ENCOUNTER — Ambulatory Visit: Admitting: Internal Medicine

## 2024-07-16 ENCOUNTER — Encounter: Payer: Self-pay | Admitting: Internal Medicine

## 2024-07-16 VITALS — BP 132/75 | HR 66 | Temp 98.4°F | Ht 63.0 in | Wt 185.0 lb

## 2024-07-16 DIAGNOSIS — E538 Deficiency of other specified B group vitamins: Secondary | ICD-10-CM

## 2024-07-16 DIAGNOSIS — N183 Chronic kidney disease, stage 3 unspecified: Secondary | ICD-10-CM

## 2024-07-16 DIAGNOSIS — F4321 Adjustment disorder with depressed mood: Secondary | ICD-10-CM

## 2024-07-16 DIAGNOSIS — I2583 Coronary atherosclerosis due to lipid rich plaque: Secondary | ICD-10-CM

## 2024-07-16 DIAGNOSIS — L299 Pruritus, unspecified: Secondary | ICD-10-CM

## 2024-07-16 DIAGNOSIS — R739 Hyperglycemia, unspecified: Secondary | ICD-10-CM

## 2024-07-16 DIAGNOSIS — I10 Essential (primary) hypertension: Secondary | ICD-10-CM | POA: Diagnosis not present

## 2024-07-16 LAB — HEMOGLOBIN A1C: Hgb A1c MFr Bld: 6.2 % (ref 4.6–6.5)

## 2024-07-16 LAB — COMPREHENSIVE METABOLIC PANEL WITH GFR
ALT: 18 U/L (ref 0–35)
AST: 23 U/L (ref 0–37)
Albumin: 4.2 g/dL (ref 3.5–5.2)
Alkaline Phosphatase: 65 U/L (ref 39–117)
BUN: 19 mg/dL (ref 6–23)
CO2: 30 meq/L (ref 19–32)
Calcium: 9.1 mg/dL (ref 8.4–10.5)
Chloride: 104 meq/L (ref 96–112)
Creatinine, Ser: 1.09 mg/dL (ref 0.40–1.20)
GFR: 49.87 mL/min — ABNORMAL LOW (ref >60.00)
Glucose, Bld: 115 mg/dL — ABNORMAL HIGH (ref 70–99)
Potassium: 3.9 meq/L (ref 3.5–5.1)
Sodium: 142 meq/L (ref 135–145)
Total Bilirubin: 0.5 mg/dL (ref 0.2–1.2)
Total Protein: 7.3 g/dL (ref 6.0–8.3)

## 2024-07-16 LAB — CBC WITH DIFFERENTIAL/PLATELET
Basophils Absolute: 0 K/uL (ref 0.0–0.1)
Basophils Relative: 0.7 % (ref 0.0–3.0)
Eosinophils Absolute: 0.2 K/uL (ref 0.0–0.7)
Eosinophils Relative: 4.3 % (ref 0.0–5.0)
HCT: 38.3 % (ref 36.0–46.0)
Hemoglobin: 12.2 g/dL (ref 12.0–15.0)
Lymphocytes Relative: 35.2 % (ref 12.0–46.0)
Lymphs Abs: 1.7 K/uL (ref 0.7–4.0)
MCHC: 31.9 g/dL (ref 30.0–36.0)
MCV: 79.8 fl (ref 78.0–100.0)
Monocytes Absolute: 0.6 K/uL (ref 0.1–1.0)
Monocytes Relative: 12.2 % — ABNORMAL HIGH (ref 3.0–12.0)
Neutro Abs: 2.3 K/uL (ref 1.4–7.7)
Neutrophils Relative %: 47.6 % (ref 43.0–77.0)
Platelets: 175 K/uL (ref 150.0–400.0)
RBC: 4.79 Mil/uL (ref 3.87–5.11)
RDW: 14.6 % (ref 11.5–15.5)
WBC: 4.8 K/uL (ref 4.0–10.5)

## 2024-07-16 LAB — URINALYSIS
Bilirubin Urine: NEGATIVE
Hgb urine dipstick: NEGATIVE
Ketones, ur: NEGATIVE
Leukocytes,Ua: NEGATIVE
Nitrite: NEGATIVE
Specific Gravity, Urine: <=1.005 — AB (ref 1.000–1.030)
Total Protein, Urine: NEGATIVE
Urine Glucose: NEGATIVE
Urobilinogen, UA: 1 (ref 0.0–1.0)
pH: 6 (ref 5.0–8.0)

## 2024-07-16 LAB — VITAMIN B12: Vitamin B-12: 408 pg/mL (ref 211–911)

## 2024-07-16 MED ORDER — ROSUVASTATIN CALCIUM 5 MG PO TABS: 5.0000 mg | ORAL_TABLET | Freq: Every day | ORAL | 3 refills | Status: AC

## 2024-07-16 MED ORDER — TRIAMCINOLONE ACETONIDE 0.1 % EX OINT: TOPICAL_OINTMENT | Freq: Two times a day (BID) | CUTANEOUS | 1 refills | Status: AC

## 2024-07-16 MED ORDER — PANTOPRAZOLE SODIUM 40 MG PO TBEC: 40.0000 mg | DELAYED_RELEASE_TABLET | Freq: Two times a day (BID) | ORAL | 5 refills | Status: AC

## 2024-07-16 MED ORDER — AMLODIPINE BESYLATE 2.5 MG PO TABS: 2.5000 mg | ORAL_TABLET | Freq: Every day | ORAL | 3 refills | Status: AC

## 2024-07-16 MED ORDER — TRIAMTERENE-HCTZ 37.5-25 MG PO TABS: 0.5000 | ORAL_TABLET | Freq: Every day | ORAL | 3 refills | Status: AC

## 2024-07-16 MED ORDER — LORAZEPAM 1 MG PO TABS: ORAL_TABLET | ORAL | 2 refills | Status: DC

## 2024-07-16 MED ORDER — TELMISARTAN 40 MG PO TABS: 40.0000 mg | ORAL_TABLET | Freq: Every day | ORAL | 3 refills | Status: AC

## 2024-07-16 MED ORDER — FAMOTIDINE 20 MG PO TABS: 20.0000 mg | ORAL_TABLET | Freq: Every day | ORAL | 2 refills | Status: AC

## 2024-07-16 MED ORDER — VENLAFAXINE HCL ER 75 MG PO CP24: 75.0000 mg | ORAL_CAPSULE | Freq: Every day | ORAL | 1 refills | Status: AC

## 2024-07-16 NOTE — Assessment & Plan Note (Signed)
 CT:  Coronary artery atherosclerosis. Aortic Atherosclerosis  On Cresor, ASA No sx's

## 2024-07-16 NOTE — Assessment & Plan Note (Signed)
 On B12

## 2024-07-16 NOTE — Progress Notes (Signed)
 Subjective:  Patient ID: Jane Mooney, female    DOB: 1951-10-19  Age: 73 y.o. MRN: [id number]  CC: Follow-up (74mo; only concern is L shoulder blade (itching, started a couple of months ago, looks a little swollen but no redness or bumps))   HPI Jane Mooney presents for a f/u - L shoulder blade (itching, started a couple of months ago, looks a little swollen but no redness or bumps).  C/o stress w/Chris - he is losing memory F/u on anxiety, depression   Outpatient Medications Prior to Visit  Medication Sig Dispense Refill   acetaminophen  (TYLENOL ) 500 MG tablet Take 1,000 mg by mouth every 6 (six) hours as needed for moderate pain or headache.     aspirin  EC 81 MG tablet Take 81 mg by mouth daily. Swallow whole.     cholecalciferol (VITAMIN D ) 1000 units tablet Take 2 tablets (2,000 Units total) by mouth daily. 100 tablet 11   Cyanocobalamin  (VITAMIN B-12) 1000 MCG SUBL Place 1 tablet (1,000 mcg total) under the tongue daily. 100 tablet 3   fish oil-omega-3 fatty acids 1000 MG capsule Take 1 g by mouth daily.     fluticasone  (FLONASE ) 50 MCG/ACT nasal spray Place 1 spray into both nostrils daily as needed for allergies. 48 g 3   hydrOXYzine  (ATARAX ) 25 MG tablet Take 1-2 tablets at night as needed for itching. Caution as this medication can make you drowsy 180 tablet 3   loratadine  (CLARITIN ) 10 MG tablet Take 1 tablet by mouth daily.     ondansetron  (ZOFRAN ) 4 MG tablet Take 1 tablet (4 mg total) by mouth every 8 (eight) hours as needed for nausea or vomiting. 20 tablet 0   phentermine  (ADIPEX-P ) 37.5 MG tablet Take 1 tablet (37.5 mg total) by mouth daily before breakfast. 30 tablet 2   Probiotic Product (ALIGN) 4 MG CAPS Take 1 capsule (4 mg total) by mouth daily. 30 capsule 1   amLODipine  (NORVASC ) 2.5 MG tablet Take 1 tablet (2.5 mg total) by mouth daily. 90 tablet 3   famotidine  (PEPCID ) 20 MG tablet Take 1 tablet (20 mg total) by mouth daily. 90 tablet 2   LORazepam  (ATIVAN ) 1  MG tablet TAKE 1 TABLET BY MOUTH TWICE A DAY AS NEEDED FOR ANXIETY 60 tablet 2   pantoprazole  (PROTONIX ) 40 MG tablet Take 1 tablet (40 mg total) by mouth 2 (two) times daily. 60 tablet 5   rosuvastatin  (CRESTOR ) 5 MG tablet Take 1 tablet (5 mg total) by mouth daily. 90 tablet 3   telmisartan  (MICARDIS ) 40 MG tablet Take 1 tablet (40 mg total) by mouth daily. 90 tablet 3   triamterene -hydrochlorothiazide (MAXZIDE-25) 37.5-25 MG tablet Take 0.5 tablets by mouth daily. 90 tablet 3   venlafaxine  XR (EFFEXOR -XR) 75 MG 24 hr capsule Take 1 capsule (75 mg total) by mouth daily with breakfast. 90 capsule 1   ipratropium (ATROVENT ) 0.03 % nasal spray Place 2 sprays into both nostrils 4 (four) times daily. (Patient not taking: Reported on 07/16/2024) 90 mL 1   triamcinolone  ointment (KENALOG ) 0.1 % APPLY 1 APPLICATION TOPICALLY 4 TIMES A DAY (Patient not taking: Reported on 07/16/2024) 80 g 1   No facility-administered medications prior to visit.    ROS: Review of Systems  Constitutional:  Positive for fatigue. Negative for activity change, appetite change, chills and unexpected weight change.  HENT:  Negative for congestion, mouth sores and sinus pressure.   Eyes:  Negative for visual disturbance.  Respiratory:  Negative for cough and chest tightness.   Gastrointestinal:  Negative for abdominal pain and nausea.  Genitourinary:  Negative for difficulty urinating, frequency and vaginal pain.  Musculoskeletal:  Positive for arthralgias and gait problem. Negative for back pain.  Skin:  Negative for pallor and rash.  Neurological:  Negative for dizziness, tremors, weakness, numbness and headaches.  Psychiatric/Behavioral:  Positive for dysphoric mood. Negative for confusion, sleep disturbance and suicidal ideas. The patient is nervous/anxious.     Objective:  BP 132/75   Pulse 66   Temp 98.4 F (36.9 C)   Ht 5' 3 (1.6 m)   Wt 185 lb (83.9 kg)   SpO2 99%   BMI 32.77 kg/m   BP Readings from Last 3  Encounters:  07/16/24 132/75  06/02/24 124/72  04/15/24 118/70    Wt Readings from Last 3 Encounters:  07/16/24 185 lb (83.9 kg)  07/06/24 182 lb (82.6 kg)  06/16/24 184 lb (83.5 kg)    Physical Exam Constitutional:      General: She is not in acute distress.    Appearance: She is well-developed. She is obese.  HENT:     Head: Normocephalic.     Right Ear: External ear normal.     Left Ear: External ear normal.     Nose: Nose normal.  Eyes:     General:        Right eye: No discharge.        Left eye: No discharge.     Conjunctiva/sclera: Conjunctivae normal.     Pupils: Pupils are equal, round, and reactive to light.  Neck:     Thyroid : No thyromegaly.     Vascular: No JVD.     Trachea: No tracheal deviation.  Cardiovascular:     Rate and Rhythm: Normal rate and regular rhythm.     Heart sounds: Normal heart sounds.  Pulmonary:     Effort: No respiratory distress.     Breath sounds: No stridor. No wheezing.  Abdominal:     General: Bowel sounds are normal. There is no distension.     Palpations: Abdomen is soft. There is no mass.     Tenderness: There is no abdominal tenderness. There is no guarding or rebound.  Musculoskeletal:        General: No tenderness.     Cervical back: Normal range of motion and neck supple. No rigidity.     Right lower leg: No edema.     Left lower leg: No edema.  Lymphadenopathy:     Cervical: No cervical adenopathy.  Skin:    Findings: No erythema or rash.  Neurological:     Mental Status: She is oriented to person, place, and time.     Cranial Nerves: No cranial nerve deficit.     Motor: No abnormal muscle tone.     Coordination: Coordination normal.     Gait: Gait abnormal.     Deep Tendon Reflexes: Reflexes normal.  Psychiatric:        Behavior: Behavior normal.        Thought Content: Thought content normal.        Judgment: Judgment normal.    Antalgic gait No rash Tearful  Lab Results  Component Value Date   WBC  5.3 03/31/2024   HGB 12.4 03/31/2024   HCT 38.4 03/31/2024   PLT 193.0 03/31/2024   GLUCOSE 103 (H) 03/31/2024   CHOL 131 04/20/2022   TRIG 82.0 04/20/2022   HDL 51.70 04/20/2022  LDLDIRECT 153.6 07/06/2013   LDLCALC 63 04/20/2022   ALT 12 03/31/2024   AST 21 03/31/2024   NA 140 03/31/2024   K 4.2 03/31/2024   CL 102 03/31/2024   CREATININE 1.07 03/31/2024   BUN 14 03/31/2024   CO2 28 03/31/2024   TSH 3.36 09/23/2023   INR 1.1 (H) 09/23/2023   HGBA1C 5.9 09/23/2023    No results found.  Assessment & Plan:   Problem List Items Addressed This Visit     Coronary atherosclerosis   CT:  Coronary artery atherosclerosis. Aortic Atherosclerosis  On Cresor, ASA No sx's      Relevant Medications   amLODipine  (NORVASC ) 2.5 MG tablet   rosuvastatin  (CRESTOR ) 5 MG tablet   telmisartan  (MICARDIS ) 40 MG tablet   triamterene -hydrochlorothiazide (MAXZIDE-25) 37.5-25 MG tablet   CRI (chronic renal insufficiency), stage 3 (moderate) (HCC)   Monitor labs Avoid NSAIDs.  Hydrate well       Relevant Orders   Comprehensive metabolic panel with GFR   CBC with Differential/Platelet   Urinalysis   Hemoglobin A1c   Essential hypertension   Micardis  40 mg/day      Relevant Medications   amLODipine  (NORVASC ) 2.5 MG tablet   rosuvastatin  (CRESTOR ) 5 MG tablet   telmisartan  (MICARDIS ) 40 MG tablet   triamterene -hydrochlorothiazide (MAXZIDE-25) 37.5-25 MG tablet   Pruritus   L shoulder itching ?etiology Triamcinolone  bid      Relevant Orders   Hemoglobin A1c   Situational depression - Primary    Ongoing stress w/Chris - he is losing memory  Effexor  to 75 mg/d      Relevant Medications   LORazepam  (ATIVAN ) 1 MG tablet   venlafaxine  XR (EFFEXOR -XR) 75 MG 24 hr capsule   Vitamin B 12 deficiency   On B12      Relevant Orders   Vitamin B12   Other Visit Diagnoses       Hyperglycemia       Relevant Orders   Comprehensive metabolic panel with GFR   Hemoglobin A1c          Meds ordered this encounter  Medications   amLODipine  (NORVASC ) 2.5 MG tablet    Sig: Take 1 tablet (2.5 mg total) by mouth daily.    Dispense:  90 tablet    Refill:  3   famotidine  (PEPCID ) 20 MG tablet    Sig: Take 1 tablet (20 mg total) by mouth daily.    Dispense:  90 tablet    Refill:  2   LORazepam  (ATIVAN ) 1 MG tablet    Sig: TAKE 1 TABLET BY MOUTH TWICE A DAY AS NEEDED FOR ANXIETY    Dispense:  60 tablet    Refill:  2   pantoprazole  (PROTONIX ) 40 MG tablet    Sig: Take 1 tablet (40 mg total) by mouth 2 (two) times daily.    Dispense:  60 tablet    Refill:  5   rosuvastatin  (CRESTOR ) 5 MG tablet    Sig: Take 1 tablet (5 mg total) by mouth daily.    Dispense:  90 tablet    Refill:  3   telmisartan  (MICARDIS ) 40 MG tablet    Sig: Take 1 tablet (40 mg total) by mouth daily.    Dispense:  90 tablet    Refill:  3   triamcinolone  ointment (KENALOG ) 0.1 %    Sig: Apply topically 2 (two) times daily.    Dispense:  80 g    Refill:  1   triamterene -hydrochlorothiazide (  MAXZIDE-25) 37.5-25 MG tablet    Sig: Take 0.5 tablets by mouth daily.    Dispense:  90 tablet    Refill:  3   venlafaxine  XR (EFFEXOR -XR) 75 MG 24 hr capsule    Sig: Take 1 capsule (75 mg total) by mouth daily with breakfast.    Dispense:  90 capsule    Refill:  1      Follow-up: Return in about 3 months (around 10/15/2024) for a follow-up visit.  Marolyn Noel, MD

## 2024-07-16 NOTE — Assessment & Plan Note (Signed)
 Ongoing stress w/Jane Mooney - he is losing memory  Effexor  to 75 mg/d

## 2024-07-16 NOTE — Assessment & Plan Note (Signed)
Monitor labs Avoid NSAIDs.  Hydrate well 

## 2024-07-16 NOTE — Assessment & Plan Note (Signed)
 L shoulder itching ?etiology Triamcinolone  bid

## 2024-07-16 NOTE — Assessment & Plan Note (Signed)
 Micardis  40 mg/day

## 2024-07-20 ENCOUNTER — Ambulatory Visit: Admitting: Internal Medicine

## 2024-07-20 ENCOUNTER — Encounter: Payer: Self-pay | Admitting: Internal Medicine

## 2024-07-20 VITALS — BP 156/64 | HR 45 | Temp 97.2°F | Resp 14 | Ht 63.0 in | Wt 182.0 lb

## 2024-07-20 DIAGNOSIS — D123 Benign neoplasm of transverse colon: Secondary | ICD-10-CM | POA: Diagnosis not present

## 2024-07-20 DIAGNOSIS — Z1211 Encounter for screening for malignant neoplasm of colon: Secondary | ICD-10-CM | POA: Diagnosis not present

## 2024-07-20 DIAGNOSIS — I493 Ventricular premature depolarization: Secondary | ICD-10-CM | POA: Diagnosis not present

## 2024-07-20 DIAGNOSIS — D120 Benign neoplasm of cecum: Secondary | ICD-10-CM

## 2024-07-20 DIAGNOSIS — F419 Anxiety disorder, unspecified: Secondary | ICD-10-CM | POA: Diagnosis not present

## 2024-07-20 DIAGNOSIS — E785 Hyperlipidemia, unspecified: Secondary | ICD-10-CM | POA: Diagnosis not present

## 2024-07-20 DIAGNOSIS — D125 Benign neoplasm of sigmoid colon: Secondary | ICD-10-CM | POA: Diagnosis not present

## 2024-07-20 DIAGNOSIS — F32A Depression, unspecified: Secondary | ICD-10-CM | POA: Diagnosis not present

## 2024-07-20 DIAGNOSIS — K635 Polyp of colon: Secondary | ICD-10-CM | POA: Diagnosis not present

## 2024-07-20 DIAGNOSIS — K648 Other hemorrhoids: Secondary | ICD-10-CM | POA: Diagnosis not present

## 2024-07-20 MED ORDER — SODIUM CHLORIDE 0.9 % IV SOLN: 500.0000 mL | INTRAVENOUS | Status: DC

## 2024-07-20 NOTE — Progress Notes (Signed)
 Pt resting comfortably. VSS. Airway intact. SBAR complete to RN. All questions answered.

## 2024-07-20 NOTE — Progress Notes (Signed)
 Called to room to assist during endoscopic procedure.  Patient ID and intended procedure confirmed with present staff. Received instructions for my participation in the procedure from the performing physician.

## 2024-07-20 NOTE — Patient Instructions (Addendum)
 Await pathology Please continue your normal medications Please read over handouts provided   YOU HAD AN ENDOSCOPIC PROCEDURE TODAY AT THE [facility]:   Refer to the procedure report that was given to you for any specific questions about what was found during the examination.  If the procedure report does not answer your questions, please call your gastroenterologist to clarify.  If you requested that your care partner not be given the details of your procedure findings, then the procedure report has been included in a sealed envelope for you to review at your convenience later.  YOU SHOULD EXPECT: Some feelings of bloating in the abdomen. Passage of more gas than usual.  Walking can help get rid of the air that was put into your GI tract during the procedure and reduce the bloating. If you had a lower endoscopy (such as a colonoscopy or flexible sigmoidoscopy) you may notice spotting of blood in your stool or on the toilet paper. If you underwent a bowel prep for your procedure, you may not have a normal bowel movement for a few days.  Please Note:  You might notice some irritation and congestion in your nose or some drainage.  This is from the oxygen used during your procedure.  There is no need for concern and it should clear up in a day or so.  SYMPTOMS TO REPORT IMMEDIATELY:  Following lower endoscopy (colonoscopy or flexible sigmoidoscopy):  Excessive amounts of blood in the stool  Significant tenderness or worsening of abdominal pains  Swelling of the abdomen that is new, acute  Fever of 100F or higher  For urgent or emergent issues, a gastroenterologist can be reached at any hour by calling [phone]. Do not use MyChart messaging for urgent concerns.    DIET:  We do recommend a small meal at first, but then you may proceed to your regular diet.  Drink plenty of fluids but you should avoid alcoholic beverages for 24 hours.  ACTIVITY:  You should plan to take it easy  for the rest of today and you should NOT DRIVE or use heavy machinery until tomorrow (because of the sedation medicines used during the test).    FOLLOW UP: Our staff will call the number listed on your records the next business day following your procedure.  We will call around 7:15- 8:00 am to check on you and address any questions or concerns that you may have regarding the information given to you following your procedure. If we do not reach you, we will leave a message.     If any biopsies were taken you will be contacted by phone or by letter within the next 1-3 weeks.  Please call us  at [phone] if you have not heard about the biopsies in 3 weeks.    SIGNATURES/CONFIDENTIALITY: You and/or your care partner have signed paperwork which will be entered into your electronic medical record.  These signatures attest to the fact that that the information above on your After Visit Summary has been reviewed and is understood.  Full responsibility of the confidentiality of this discharge information lies with you and/or your care-partner.

## 2024-07-20 NOTE — Progress Notes (Signed)
 Pt's states no medical or surgical changes since previsit or office visit.

## 2024-07-20 NOTE — Op Note (Signed)
 [facility] Patient Name: Jane Mooney Procedure Date: 07/20/2024 10:04 AM MRN: [id number] Endoscopist: Rosario Estefana Kidney , , [id number] Age: 73 Referring MD:  Date of Birth: Mar 24, 1951 Gender: Female Account #: [id number] Procedure:                Colonoscopy Indications:              Screening for colorectal malignant neoplasm Medicines:                Monitored Anesthesia Care Procedure:                Pre-Anesthesia Assessment:                           - Prior to the procedure, a History and Physical                            was performed, and patient medications and                            allergies were reviewed. The patient's tolerance of                            previous anesthesia was also reviewed. The risks                            and benefits of the procedure and the sedation                            options and risks were discussed with the patient.                            All questions were answered, and informed consent                            was obtained. Prior Anticoagulants: The patient has                            taken no anticoagulant or antiplatelet agents. ASA                            Grade Assessment: II - A patient with mild systemic                            disease. After reviewing the risks and benefits,                            the patient was deemed in satisfactory condition to                            undergo the procedure.                           After obtaining informed consent, the colonoscope  was passed under direct vision. Throughout the                            procedure, the patient's blood pressure, pulse, and                            oxygen saturations were monitored continuously. The                            Olympus Scope SN [id number] was introduced through the                            anus and advanced to the the cecum, identified by                            the  ileocecal valve. The colonoscopy was performed                            without difficulty. The patient tolerated the                            procedure well. The quality of the bowel                            preparation was good. The terminal ileum, ileocecal                            valve, appendiceal orifice, and rectum were                            photographed. Scope In: 10:26:03 AM Scope Out: 10:47:47 AM Scope Withdrawal Time: 0 hours 12 minutes 10 seconds  Total Procedure Duration: 0 hours 21 minutes 44 seconds  Findings:                 The terminal ileum appeared normal.                           Two sessile polyps were found in the transverse                            colon and cecum. The polyps were 3 to 5 mm in size.                            These polyps were removed with a cold snare.                            Resection and retrieval were complete.                           A 3 mm polyp was found in the sigmoid colon. The                            polyp was sessile. The  polyp was removed with a                            cold snare. Resection and retrieval were complete.                           Non-bleeding internal hemorrhoids were found during                            retroflexion. Complications:            No immediate complications. Estimated Blood Loss:     Estimated blood loss was minimal. Impression:               - The examined portion of the ileum was normal.                           - Two 3 to 5 mm polyps in the transverse colon and                            in the cecum, removed with a cold snare. Resected                            and retrieved.                           - One 3 mm polyp in the sigmoid colon, removed with                            a cold snare. Resected and retrieved.                           - Non-bleeding internal hemorrhoids. Recommendation:           - Discharge patient to home (with escort).                            - Await pathology results.                           - The findings and recommendations were discussed                            with the patient. Dr Estefana Federico Rosario Estefana Federico,  07/20/2024 10:51:02 AM

## 2024-07-20 NOTE — Addendum Note (Signed)
 Addended by: ELEANORA NEPTUNE B on: 07/20/2024 11:27 AM   Modules accepted: Orders

## 2024-07-20 NOTE — Progress Notes (Signed)
 GASTROENTEROLOGY PROCEDURE H&P NOTE   Primary Care Physician: Garald Karlynn GAILS, MD    Reason for Procedure:   Colon cancer screening  Plan:    Colonoscopy  Patient is appropriate for endoscopic procedure(s) in the ambulatory ([facility]) setting.  The nature of the procedure, as well as the risks, benefits, and alternatives were carefully and thoroughly reviewed with the patient. Ample time for discussion and questions allowed. The patient understood, was satisfied, and agreed to proceed.     HPI: Jane Mooney is a 73 y.o. female who presents for colonoscopy for colon cancer screening. Denies blood in stools, changes in bowel habits, or unintentional weight loss. Denies family history of colon cancer.  Colonoscopy 12/23/13:  Path: Surgical [P], ascending, polyp - HYPERPLASTIC POLYP. - THERE IS NO EVIDENCE OF MALIGNANCY.  Past Medical History:  Diagnosis Date   Allergic rhinitis    Anemia    Anxiety 2010   Chronic kidney disease    renal insufficiency    Depression    DJD (degenerative joint disease)    Left knee   Heart murmur    as a child    Hemorrhoid    HTN (hypertension)    STABLE   Hyperlipidemia    LBP (low back pain)    MVP (mitral valve prolapse)    PONV (postoperative nausea and vomiting)    Symptomatic PVCs    Urticaria    recurrent    Past Surgical History:  Procedure Laterality Date   ABDOMINAL HYSTERECTOMY  1984   VAGINAL HYSTERECTOMY   CATARACT EXTRACTION  02/09/2022   CATARACT EXTRACTION  02/10/2023   HEMORRHOID SURGERY     KNEE ARTHROSCOPY WITH MEDIAL MENISECTOMY Right 05/21/2014   Procedure: RIGHT KNEE ARTHROSCOPY WITH DEBRIDEMENT/SHAVING, MEDIAL MENISECTOMY;  Surgeon: Evalene JONETTA Chancy, MD;  Location: [facility];  Service: Orthopedics;  Laterality: Right;   KNEE SURGERY  03/2007   Left Knee Arthr- Dr Melita   right bunionectomy      ROTATOR CUFF REPAIR  2004   Left    TONSILLECTOMY     TOTAL KNEE ARTHROPLASTY Left  07/05/2020   Procedure: TOTAL KNEE ARTHROPLASTY;  Surgeon: Chancy Evalene JONETTA, MD;  Location: [facility];  Service: Orthopedics;  Laterality: Left;   [place]      Prior to Admission medications   Medication Sig Start Date End Date Taking? Authorizing Provider  amLODipine  (NORVASC ) 2.5 MG tablet Take 1 tablet (2.5 mg total) by mouth daily. 07/16/24  Yes Plotnikov, Karlynn GAILS, MD  aspirin  EC 81 MG tablet Take 81 mg by mouth daily. Swallow whole.   Yes [provider]  cholecalciferol (VITAMIN D ) 1000 units tablet Take 2 tablets (2,000 Units total) by mouth daily. 02/07/16  Yes Plotnikov, Aleksei V, MD  Cyanocobalamin  (VITAMIN B-12) 1000 MCG SUBL Place 1 tablet (1,000 mcg total) under the tongue daily. 09/23/23  Yes Plotnikov, Karlynn GAILS, MD  famotidine  (PEPCID ) 20 MG tablet Take 1 tablet (20 mg total) by mouth daily. 07/16/24  Yes Plotnikov, Aleksei V, MD  fish oil-omega-3 fatty acids 1000 MG capsule Take 1 g by mouth daily.   Yes [provider]  LORazepam  (ATIVAN ) 1 MG tablet TAKE 1 TABLET BY MOUTH TWICE A DAY AS NEEDED FOR ANXIETY 07/16/24  Yes Plotnikov, Karlynn GAILS, MD  ondansetron  (ZOFRAN ) 4 MG tablet Take 1 tablet (4 mg total) by mouth every 8 (eight) hours as needed for nausea or vomiting. 03/31/24  Yes Plotnikov, Karlynn GAILS, MD  pantoprazole  (PROTONIX )  40 MG tablet Take 1 tablet (40 mg total) by mouth 2 (two) times daily. 07/16/24  Yes Plotnikov, Karlynn GAILS, MD  rosuvastatin  (CRESTOR ) 5 MG tablet Take 1 tablet (5 mg total) by mouth daily. 07/16/24  Yes Plotnikov, Karlynn GAILS, MD  telmisartan  (MICARDIS ) 40 MG tablet Take 1 tablet (40 mg total) by mouth daily. 07/16/24  Yes Plotnikov, Karlynn GAILS, MD  triamcinolone  ointment (KENALOG ) 0.1 % Apply topically 2 (two) times daily. 07/16/24  Yes Plotnikov, Aleksei V, MD  triamterene -hydrochlorothiazide (MAXZIDE-25) 37.5-25 MG tablet Take 0.5 tablets by mouth daily. 07/16/24  Yes Plotnikov, Karlynn GAILS, MD  acetaminophen  (TYLENOL ) 500 MG tablet Take 1,000 mg by  mouth every 6 (six) hours as needed for moderate pain or headache.    [provider]  fluticasone  (FLONASE ) 50 MCG/ACT nasal spray Place 1 spray into both nostrils daily as needed for allergies. 09/05/23   Iva Marty Saltness, MD  hydrOXYzine  (ATARAX ) 25 MG tablet Take 1-2 tablets at night as needed for itching. Caution as this medication can make you drowsy 09/05/23   Iva Marty Saltness, MD  ipratropium (ATROVENT ) 0.03 % nasal spray Place 2 sprays into both nostrils 4 (four) times daily. Patient not taking: Reported on 07/06/2024 09/23/23   Plotnikov, Karlynn GAILS, MD  loratadine  (CLARITIN ) 10 MG tablet Take 1 tablet by mouth daily.    [provider]  phentermine  (ADIPEX-P ) 37.5 MG tablet Take 1 tablet (37.5 mg total) by mouth daily before breakfast. 04/15/24 07/16/24  Plotnikov, Aleksei V, MD  Probiotic Product (ALIGN) 4 MG CAPS Take 1 capsule (4 mg total) by mouth daily. 09/23/23   Plotnikov, Aleksei V, MD  venlafaxine  XR (EFFEXOR -XR) 75 MG 24 hr capsule Take 1 capsule (75 mg total) by mouth daily with breakfast. 07/16/24   Plotnikov, Karlynn GAILS, MD    Current Outpatient Medications  Medication Sig Dispense Refill   amLODipine  (NORVASC ) 2.5 MG tablet Take 1 tablet (2.5 mg total) by mouth daily. 90 tablet 3   aspirin  EC 81 MG tablet Take 81 mg by mouth daily. Swallow whole.     cholecalciferol (VITAMIN D ) 1000 units tablet Take 2 tablets (2,000 Units total) by mouth daily. 100 tablet 11   Cyanocobalamin  (VITAMIN B-12) 1000 MCG SUBL Place 1 tablet (1,000 mcg total) under the tongue daily. 100 tablet 3   famotidine  (PEPCID ) 20 MG tablet Take 1 tablet (20 mg total) by mouth daily. 90 tablet 2   fish oil-omega-3 fatty acids 1000 MG capsule Take 1 g by mouth daily.     LORazepam  (ATIVAN ) 1 MG tablet TAKE 1 TABLET BY MOUTH TWICE A DAY AS NEEDED FOR ANXIETY 60 tablet 2   ondansetron  (ZOFRAN ) 4 MG tablet Take 1 tablet (4 mg total) by mouth every 8 (eight) hours as needed for nausea or  vomiting. 20 tablet 0   pantoprazole  (PROTONIX ) 40 MG tablet Take 1 tablet (40 mg total) by mouth 2 (two) times daily. 60 tablet 5   rosuvastatin  (CRESTOR ) 5 MG tablet Take 1 tablet (5 mg total) by mouth daily. 90 tablet 3   telmisartan  (MICARDIS ) 40 MG tablet Take 1 tablet (40 mg total) by mouth daily. 90 tablet 3   triamcinolone  ointment (KENALOG ) 0.1 % Apply topically 2 (two) times daily. 80 g 1   triamterene -hydrochlorothiazide (MAXZIDE-25) 37.5-25 MG tablet Take 0.5 tablets by mouth daily. 90 tablet 3   acetaminophen  (TYLENOL ) 500 MG tablet Take 1,000 mg by mouth every 6 (six) hours as needed for moderate pain or headache.  fluticasone  (FLONASE ) 50 MCG/ACT nasal spray Place 1 spray into both nostrils daily as needed for allergies. 48 g 3   hydrOXYzine  (ATARAX ) 25 MG tablet Take 1-2 tablets at night as needed for itching. Caution as this medication can make you drowsy 180 tablet 3   ipratropium (ATROVENT ) 0.03 % nasal spray Place 2 sprays into both nostrils 4 (four) times daily. (Patient not taking: Reported on 07/06/2024) 90 mL 1   loratadine  (CLARITIN ) 10 MG tablet Take 1 tablet by mouth daily.     phentermine  (ADIPEX-P ) 37.5 MG tablet Take 1 tablet (37.5 mg total) by mouth daily before breakfast. 30 tablet 2   Probiotic Product (ALIGN) 4 MG CAPS Take 1 capsule (4 mg total) by mouth daily. 30 capsule 1   venlafaxine  XR (EFFEXOR -XR) 75 MG 24 hr capsule Take 1 capsule (75 mg total) by mouth daily with breakfast. 90 capsule 1   Current Facility-Administered Medications  Medication Dose Route Frequency Provider Last Rate Last Admin   0.9 %  sodium chloride  infusion  500 mL Intravenous Continuous Federico Rosario BROCKS, MD        Allergies as of 07/20/2024 - Review Complete 07/20/2024  Allergen Reaction Noted   Citalopram  Other (See Comments) 05/03/2011   Mirtazapine Other (See Comments) 09/30/2007   Quinapril hcl Cough 09/30/2007    Family History  Problem Relation Age of Onset    Hypertension Mother    Bone cancer Maternal Aunt    Breast cancer Maternal Grandmother    Diabetes Other    Hypertension Other    Breast cancer Other    Cancer Other        breast    Colon cancer Neg Hx    Esophageal cancer Neg Hx    Stomach cancer Neg Hx    Rectal cancer Neg Hx     Social History   Socioeconomic History   Marital status: Married    Spouse name: Garment/textile technologist   Number of children: 2   Years of education: Not on file   Highest education level: Associate degree: occupational, Scientist, product/process development, or vocational program  Occupational History   Occupation: Retired Teacher, adult education:     Comment: 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   Social Drivers of Corporate investment banker Strain: Low Risk  (07/13/2024)   Overall Financial Resource Strain (CARDIA)    Difficulty of Paying Living Expenses: Not very hard  Food Insecurity: No Food Insecurity (07/13/2024)   Hunger Vital Sign    Worried About Running Out of Food in the Last Year: Never true    Ran Out of Food in the Last Year: Never true  Transportation Needs: No Transportation Needs (07/13/2024)   PRAPARE - Administrator, Civil Service (Medical): No    Lack of Transportation (Non-Medical): No  Physical Activity: Insufficiently Active (07/13/2024)   Exercise Vital Sign    Days of Exercise per Week: 3 days    Minutes of Exercise per Session: 20 min  Stress: Stress Concern Present (07/13/2024)   [employer] of Occupational Health - Occupational Stress Questionnaire  Feeling of Stress: Very much  Social Connections: Socially Integrated (07/13/2024)   Social Connection  and Isolation Panel    Frequency of Communication with Friends and Family: Twice a week    Frequency of Social Gatherings with Friends and Family: Once a week    Attends Religious Services: More than 4 times per year    Active Member of [employer] or Organizations: Yes    Attends Engineer, structural: More than 4 times per year    Marital Status: Married  Catering manager Violence: Not At Risk (09/19/2023)   Humiliation, Afraid, Rape, and Kick questionnaire    Fear of Current or Ex-Partner: No    Emotionally Abused: No    Physically Abused: No    Sexually Abused: No    Physical Exam: Vital signs in last 24 hours: BP 139/65   Pulse (!) 56   Temp (!) 97.2 F (36.2 C) (Temporal)   Ht 5' 3 (1.6 m)   Wt 182 lb (82.6 kg)   SpO2 100%   BMI 32.24 kg/m  GEN: NAD EYE: Sclerae anicteric ENT: MMM CV: Non-tachycardic Pulm: No increased work of breathing GI: Soft, NT/ND NEURO:  Alert & Oriented   Estefana Kidney, MD [facility]  07/20/2024 10:13 AM

## 2024-07-21 ENCOUNTER — Ambulatory Visit: Payer: Self-pay | Admitting: Internal Medicine

## 2024-07-21 ENCOUNTER — Telehealth: Payer: Self-pay

## 2024-07-21 NOTE — Telephone Encounter (Signed)
  Follow up Call-     07/20/2024    9:48 AM  Call back number  Post procedure Call Back phone  # [phone]  Permission to leave phone message Yes     Patient questions:  Do you have a fever, pain , or abdominal swelling? No. Pain Score  0 *  Have you tolerated food without any problems? Yes.    Have you been able to return to your normal activities? Yes.    Do you have any questions about your discharge instructions: Diet   No. Medications  No. Follow up visit  No.  Do you have questions or concerns about your Care? No.  Actions: * If pain score is 4 or above: No action needed, pain <4.

## 2024-07-23 ENCOUNTER — Ambulatory Visit: Payer: Self-pay | Admitting: Internal Medicine

## 2024-07-23 LAB — SURGICAL PATHOLOGY

## 2024-08-14 ENCOUNTER — Other Ambulatory Visit (HOSPITAL_BASED_OUTPATIENT_CLINIC_OR_DEPARTMENT_OTHER): Payer: Self-pay

## 2024-08-14 MED ORDER — FLUZONE HIGH-DOSE 0.5 ML IM SUSY
0.5000 mL | PREFILLED_SYRINGE | Freq: Once | INTRAMUSCULAR | 0 refills | Status: AC
  Filled 2024-08-14: qty 0.5, 1d supply, fill #0

## 2024-09-03 ENCOUNTER — Ambulatory Visit: Payer: Medicare HMO | Admitting: Allergy & Immunology

## 2024-09-17 ENCOUNTER — Ambulatory Visit (HOSPITAL_BASED_OUTPATIENT_CLINIC_OR_DEPARTMENT_OTHER)
Admission: RE | Admit: 2024-09-17 | Discharge: 2024-09-17 | Disposition: A | Discharge status: Home / Self Care | Source: Ambulatory Visit | Attending: Internal Medicine | Admitting: Internal Medicine

## 2024-09-17 DIAGNOSIS — Z0000 Encounter for general adult medical examination without abnormal findings: Secondary | ICD-10-CM | POA: Diagnosis not present

## 2024-09-17 DIAGNOSIS — Z780 Asymptomatic menopausal state: Secondary | ICD-10-CM | POA: Insufficient documentation

## 2024-09-17 DIAGNOSIS — M810 Age-related osteoporosis without current pathological fracture: Secondary | ICD-10-CM | POA: Diagnosis not present

## 2024-09-20 ENCOUNTER — Other Ambulatory Visit: Payer: Self-pay | Admitting: Internal Medicine

## 2024-09-22 ENCOUNTER — Encounter: Payer: Self-pay | Admitting: Allergy & Immunology

## 2024-09-22 ENCOUNTER — Other Ambulatory Visit: Payer: Self-pay | Admitting: Internal Medicine

## 2024-09-22 ENCOUNTER — Other Ambulatory Visit: Payer: Self-pay

## 2024-09-22 ENCOUNTER — Ambulatory Visit: Admitting: Allergy & Immunology

## 2024-09-22 ENCOUNTER — Ambulatory Visit: Payer: Medicare HMO

## 2024-09-22 VITALS — BP 110/62 | HR 66 | Temp 97.9°F | Resp 18 | Ht 62.6 in | Wt 185.4 lb

## 2024-09-22 VITALS — Ht 62.5 in | Wt 185.0 lb

## 2024-09-22 DIAGNOSIS — J302 Other seasonal allergic rhinitis: Secondary | ICD-10-CM | POA: Diagnosis not present

## 2024-09-22 DIAGNOSIS — Z1231 Encounter for screening mammogram for malignant neoplasm of breast: Secondary | ICD-10-CM

## 2024-09-22 DIAGNOSIS — J3089 Other allergic rhinitis: Secondary | ICD-10-CM | POA: Diagnosis not present

## 2024-09-22 DIAGNOSIS — L500 Allergic urticaria: Secondary | ICD-10-CM | POA: Diagnosis not present

## 2024-09-22 DIAGNOSIS — Z0000 Encounter for general adult medical examination without abnormal findings: Secondary | ICD-10-CM | POA: Diagnosis not present

## 2024-09-22 MED ORDER — HYDROXYZINE HCL 25 MG PO TABS: ORAL_TABLET | ORAL | 3 refills | Status: AC

## 2024-09-22 NOTE — Progress Notes (Signed)
 FOLLOW UP  Date of Service/Encounter:  09/22/24   Assessment:   Seasonal and perennial allergic rhinitis (grasses, ragweed, weeds, trees, indoor molds, outdoor molds, dust mites and cat) - well controlled with medications alone   Allergic urticaria - decreasing antihistamines over time   Pulmonary nodules - monitored by Dr. Kassie (got regular chest CTs and they did not change, therefore no further workup needed)    Plan/Recommendations:   1. Seasonal and perennial allergic rhinitis (grasses, ragweed, weeds, trees, indoor molds, outdoor molds, dust mites and cat) - Continue with: Zyrtec  (cetirizine ) 10mg  once daily and Flonase  (fluticasone ) one spray per nostril daily - You could consider doing a humidifier in your room to help with the tickle, but this will make the dust mite allergy worse, so I hate to recommend that.  - Continue with drinking the water  at night as needed.     2. Allergic urticaria - Previous lab workup was fairly normal.  - Morning: Zyrtec  (cetirizine ) 10mg  (one tablet)   - Evening: hydroxyzine  1-2 tablets at night - Continue with the Eucerin as you are doing.   3. Return in about 1 year (around 09/22/2025). You can have the follow up appointment with Dr. Iva or a Nurse Practicioner (our Nurse Practitioners are excellent and always have Physician oversight!).   Subjective:   Jane Mooney is a 73 y.o. female presenting today for follow up of  Chief Complaint  Patient presents with   Follow-up    Jane Mooney has a history of the following: Patient Active Problem List   Diagnosis Date Noted   Pruritus 07/16/2024   Abdominal pain 03/31/2024   Breast pain in female 01/09/2024   Preop exam for internal medicine 09/23/2023   Tennis elbow 09/23/2023   Wheezing 06/03/2023   Bronchitis 05/24/2023   Fatigue 12/19/2022   Meteorism 12/19/2022   Edema 03/15/2022   Anserine bursitis 03/15/2022   Aortic atherosclerosis 12/12/2021   Coronary  atherosclerosis 12/12/2021   Nodule of middle lobe of right lung 10/17/2021   Restrictive lung disease 09/27/2021   Diffusion capacity of lung (dl), decreased 88/83/7977   Ankle pain, right 09/05/2021   Statin declined 09/05/2021   Cough 05/02/2021   Goiter 05/02/2021   Right hip pain 02/10/2021   Achilles tendinitis 12/20/2020   Memory problem 12/20/2020   Pain in surgical scar 09/20/2020   Seasonal and perennial allergic rhinitis 03/31/2020   CRI (chronic renal insufficiency), stage 3 (moderate) 09/26/2019   Abscess of face 06/13/2017   Constipation 02/22/2017   Chest pain, atypical 11/30/2016   GERD (gastroesophageal reflux disease) 11/30/2016   Menopause 10/23/2016   Abscess of finger, left 08/17/2016   Thoracic back pain 09/29/2015   Osteoarthritis of both knees 09/29/2015   Allergic urticaria 03/29/2014   Grief 03/22/2014   Well adult exam 07/02/2012   Obesity 03/06/2012   Cervical pain (neck) 12/28/2011   Neoplasm of uncertain behavior of skin 08/03/2011   Onychomycosis 08/03/2011   Vitamin B 12 deficiency 05/07/2011   Hemorrhoid 05/03/2011   Dyslipidemia 10/31/2010   TRIGGER FINGER 03/30/2010   WEIGHT LOSS 12/15/2009   LOW BACK PAIN 10/25/2009   HYPERGLYCEMIA 10/25/2009   Vitamin D  deficiency 02/24/2009   VERTIGO 04/09/2008   KNEE PAIN 03/30/2008   Rhinitis, allergic 02/13/2008   Pain in joint, shoulder region 02/13/2008   Anxiety disorder 09/30/2007   Situational depression 09/30/2007   EAR PAIN 09/30/2007   Essential hypertension 09/30/2007    History obtained from: chart review  and patient.  Discussed the use of AI scribe software for clinical note transcription with the patient and/or guardian, who gave verbal consent to proceed.  Jane Mooney is a 73 y.o. female presenting for a follow up visit. She was last seen in October 2024. At that time, we continued with cetirizine  as well as Flonase . For her urticaria, we continued with cetirizine  in the morning and  hydroxyzine  at night.   Since last visit, she has been very stable.  She has no new breathing issues and describes her breathing as 'good'. She previously underwent chest CTs every six months for a year, but no further imaging has been required since then. She occasionally experiences a 'dry tickle' in her throat, particularly at night, which she manages by drinking water . She does not use a humidifier.  No recent sinus infections, ear infections, or pneumonias. She experiences an itching sensation on her shoulder, which she treats with Eucerin skin calming cream, finding it effective. She continues to take Zyrtec  (cetirizine ) once daily and hydroxyzine  at night.  Her social history includes recent caregiving for her husband following his hip replacement surgery, which she found exhausting. She is retiring and typically spends her days socializing with friends and attending meetings.      Otherwise, there have been no changes to her past medical history, surgical history, family history, or social history.    Review of systems otherwise negative other than that mentioned in the HPI.    Objective:   Blood pressure 110/62, pulse 66, temperature 97.9 F (36.6 C), temperature source Temporal, resp. rate 18, height 5' 2.6 (1.59 m), weight 185 lb 6.4 oz (84.1 kg), SpO2 99%. Body mass index is 33.27 kg/m.    Physical Exam Vitals reviewed.  Constitutional:      Appearance: She is well-developed.     Comments: Pleasant talkative female. Cooperative with the exam.   HENT:     Head: Normocephalic and atraumatic.     Right Ear: Tympanic membrane, ear canal and external ear normal.     Left Ear: Tympanic membrane, ear canal and external ear normal.     Nose: No nasal deformity, septal deviation, mucosal edema or rhinorrhea.     Right Turbinates: Enlarged, swollen and pale.     Left Turbinates: Enlarged, swollen and pale.     Right Sinus: No maxillary sinus tenderness or frontal sinus  tenderness.     Left Sinus: No maxillary sinus tenderness or frontal sinus tenderness.     Comments: No polyps.    Mouth/Throat:     Mouth: Mucous membranes are not pale and not dry.     Pharynx: Uvula midline.  Eyes:     General:        Right eye: No discharge.        Left eye: No discharge.     Conjunctiva/sclera: Conjunctivae normal.     Right eye: Right conjunctiva is not injected. No chemosis.    Left eye: Left conjunctiva is not injected. No chemosis.    Pupils: Pupils are equal, round, and reactive to light.  Cardiovascular:     Rate and Rhythm: Normal rate and regular rhythm.     Heart sounds: Normal heart sounds.  Pulmonary:     Effort: Pulmonary effort is normal. No tachypnea, accessory muscle usage or respiratory distress.     Breath sounds: Normal breath sounds. No wheezing, rhonchi or rales.  Chest:     Chest wall: No tenderness.  Lymphadenopathy:     Cervical:  No cervical adenopathy.  Skin:    General: Skin is warm.     Capillary Refill: Capillary refill takes less than 2 seconds.     Coloration: Skin is not pale.     Findings: No abrasion, erythema, petechiae or rash. Rash is not papular, urticarial or vesicular.     Comments: No hives.  Neurological:     Mental Status: She is alert.  Psychiatric:        Behavior: Behavior is cooperative.      Diagnostic studies: none      Marty Shaggy, MD  [facility]  

## 2024-09-22 NOTE — Patient Instructions (Addendum)
 1. Seasonal and perennial allergic rhinitis (grasses, ragweed, weeds, trees, indoor molds, outdoor molds, dust mites and cat) - Continue with: Zyrtec  (cetirizine ) 10mg  once daily and Flonase  (fluticasone ) one spray per nostril daily - You could consider doing a humidifier in your room to help with the tickle, but this will make the dust mite allergy worse, so I hate to recommend that.  - Continue with drinking the water  at night as needed.     2. Allergic urticaria - Previous lab workup was fairly normal.  - Morning: Zyrtec  (cetirizine ) 10mg  (one tablet)   - Evening: hydroxyzine  1-2 tablets at night - Continue with the Eucerin as you are doing.   3. Return in about 1 year (around 09/22/2025). You can have the follow up appointment with Dr. Iva or a Nurse Practicioner (our Nurse Practitioners are excellent and always have Physician oversight!).    Please inform us  of any Emergency Department visits, hospitalizations, or changes in symptoms. Call us  before going to the ED for breathing or allergy symptoms since we might be able to fit you in for a sick visit. Feel free to contact us  anytime with any questions, problems, or concerns.  It was a pleasure to see you again today!  Websites that have reliable patient information: 1. American Academy of Asthma, Allergy, and Immunology: www.aaaai.org 2. Food Allergy Research and Education (FARE): foodallergy.org 3. Mothers of Asthmatics: http://www.asthmacommunitynetwork.org 4. American College of Allergy, Asthma, and Immunology: www.acaai.org      "Like" us  on Facebook and Instagram for our latest updates!      A healthy democracy works best when [employer] participate! Make sure you are registered to vote! If you have moved or changed any of your contact information, you will need to get this updated before voting! Scan the QR codes below to learn more!

## 2024-09-22 NOTE — Progress Notes (Cosign Needed Addendum)
 Subjective:   Jane Mooney is a 73 y.o. female who presents for a Medicare Annual Wellness Visit.  I connected with  Jane Mooney on 09/22/24 by a audio enabled telemedicine application and verified that I am speaking with the correct person using two identifiers.  Patient Location: Home  Provider Location: Office/Clinic  Persons Participating in Visit: Patient.  I discussed the limitations of evaluation and management by telemedicine. The patient expressed understanding and agreed to proceed.  Vital Signs: Because this visit was a virtual/telehealth visit, some criteria may be missing or patient reported. Any vitals not documented were not able to be obtained and vitals that have been documented are patient reported.   Allergies (verified) Citalopram , Mirtazapine, and Quinapril hcl   History: Past Medical History:  Diagnosis Date   Allergic rhinitis    Anemia    Anxiety 2010   Chronic kidney disease    renal insufficiency    Depression    DJD (degenerative joint disease)    Left knee   Heart murmur    as a child    Hemorrhoid    HTN (hypertension)    STABLE   Hyperlipidemia    LBP (low back pain)    MVP (mitral valve prolapse)    PONV (postoperative nausea and vomiting)    Symptomatic PVCs    Urticaria    recurrent   Past Surgical History:  Procedure Laterality Date   ABDOMINAL HYSTERECTOMY  1984   VAGINAL HYSTERECTOMY   CATARACT EXTRACTION  02/09/2022   CATARACT EXTRACTION  02/10/2023   HEMORRHOID SURGERY     KNEE ARTHROSCOPY WITH MEDIAL MENISECTOMY Right 05/21/2014   Procedure: RIGHT KNEE ARTHROSCOPY WITH DEBRIDEMENT/SHAVING, MEDIAL MENISECTOMY;  Surgeon: Evalene JONETTA Chancy, MD;  Location: [facility];  Service: Orthopedics;  Laterality: Right;   KNEE SURGERY  03/2007   Left Knee Arthr- Dr Melita   right bunionectomy      ROTATOR CUFF REPAIR  2004   Left    TONSILLECTOMY     TOTAL KNEE ARTHROPLASTY Left 07/05/2020   Procedure: TOTAL KNEE  ARTHROPLASTY;  Surgeon: Chancy Evalene JONETTA, MD;  Location: [facility];  Service: Orthopedics;  Laterality: Left;   [place]     Family History  Problem Relation Age of Onset   Hypertension Mother    Bone cancer Maternal Aunt    Breast cancer Maternal Grandmother    Diabetes Other    Hypertension Other    Breast cancer Other    Cancer Other        breast    Colon cancer Neg Hx    Esophageal cancer Neg Hx    Stomach cancer Neg Hx    Rectal cancer Neg Hx    Social History   Occupational History   Occupation: Retired Teacher, Adult Education: [employer]    Comment: 01/2010  Tobacco Use   Smoking status: Never   Smokeless tobacco: Never  Vaping Use   Vaping status: Never Used  Substance and Sexual Activity   Alcohol use: Not Currently   Drug use: No   Sexual activity: Yes    Birth control/protection: Surgical    Comment: Vaginal hysterectomy   Tobacco Counseling Counseling given: Not Answered  SDOH Screenings   Food Insecurity: No Food Insecurity (09/22/2024)  Housing: Low Risk  (09/22/2024)  Transportation Needs: No Transportation Needs (09/22/2024)  Utilities: Not At Risk (09/22/2024)  Alcohol Screen: Low Risk  (11/01/2022)  Depression (PHQ2-9): Low Risk  (09/22/2024)  Recent Concern: Depression (PHQ2-9) - Medium Risk (07/16/2024)  Financial Resource Strain: Low Risk  (09/21/2024)  Physical Activity: Insufficiently Active (09/22/2024)  Social Connections: Socially Integrated (09/22/2024)  Stress: Stress Concern Present (09/22/2024)  Tobacco Use: Low Risk  (09/22/2024)  Health Literacy: Adequate Health Literacy (09/22/2024)   Depression Screen    09/22/2024   11:40 AM 07/16/2024    8:56 AM 09/19/2023   11:26 AM 06/19/2023    9:30 AM 03/19/2023    9:17 AM 03/11/2023    3:12 PM 12/19/2022    8:29 AM  PHQ 2/9 Scores  PHQ - 2 Score 0 2 5 3 1 1  0  PHQ- 9 Score 0 7  15  9     0      Data saved with a previous flowsheet row definition     Goals Addressed               This  Visit's Progress     Patient Stated (pt-stated)        Patient stated she plans to continue walking but knee discomfort slows her down       Visit info / Clinical Intake: Medicare Wellness Visit Type:: Subsequent Annual Wellness Visit Persons participating in visit:: patient Medicare Wellness Visit Mode:: Telephone If telephone:: video declined Because this visit was a virtual/telehealth visit:: vitals recorded from last visit If Telephone or Video please confirm:: I connected with the patient using audio enabled telemedicine application and verified that I am speaking with the correct person using two identifiers; I discussed the limitations of evaluation and management by telemedicine; The patient expressed understanding and agreed to proceed Patient Location:: Home Provider Location:: Office Information given by:: patient Interpreter Needed?: No Pre-visit prep was completed: yes AWV questionnaire completed by patient prior to visit?: yes Date:: 09/21/24 Living arrangements:: lives with spouse/significant other Patient's Overall Health Status Rating: good Typical amount of pain: none Does pain affect daily life?: no Are you currently prescribed opioids?: no  Dietary Habits and Nutritional Risks How many meals a day?: 2 Eats fruit and vegetables daily?: yes Most meals are obtained by: preparing own meals In the last 2 weeks, have you had any of the following?: none Diabetic:: no  Functional Status Activities of Daily Living (to include ambulation/medication): Independent Ambulation: Independent with device- listed below Home Assistive Devices/Equipment: Eyeglasses; Dentures (specify type) Medication Administration: Independent Home Management: Independent Manage your own finances?: yes Primary transportation is: driving Concerns about vision?: no *vision screening is required for [facility]* Concerns about hearing?: no  Fall Screening Falls in the past year?: 0 Number of  falls in past year: 0 Was there an injury with Fall?: 0 Fall Risk Category Calculator: 0 Patient Fall Risk Level: Low Fall Risk  Fall Risk Patient at Risk for Falls Due to: No Fall Risks Fall risk Follow up: Falls evaluation completed; Falls prevention discussed  Home and Transportation Safety: All rugs have non-skid backing?: yes All stairs or steps have railings?: yes Grab bars in the bathtub or shower?: (!) no Have non-skid surface in bathtub or shower?: yes Good home lighting?: yes Regular seat belt use?: yes Hospital stays in the last year:: no  Cognitive Assessment Difficulty concentrating, remembering, or making decisions? : no Will 6CIT or Mini Cog be Completed: yes What year is it?: 0 points What month is it?: 0 points Give patient an address phrase to remember (5 components): [address] [place], [place] About what time is it?: 0 points Count backwards from 20 to  1: 0 points Say the months of the year in reverse: 0 points Repeat the address phrase from earlier: 0 points 6 CIT Score: 0 points  Advance Directives (For Healthcare) Does Patient Have a Medical Advance Directive?: Yes Does patient want to make changes to medical advance directive?: Yes (Inpatient - patient requests chaplain consult to change a medical advance directive) Type of Advance Directive: Healthcare Power of [place]; Living will Copy of Healthcare Power of Attorney in Chart?: No - copy requested Copy of Living Will in Chart?: No - copy requested  Reviewed/Updated  Reviewed/Updated: Reviewed All (Medical, Surgical, Family, Medications, Allergies, Care Teams, Patient Goals)        Objective:    Today's Vitals   09/22/24 1133  Weight: 185 lb (83.9 kg)  Height: 5' 2.5 (1.588 m)   Body mass index is 33.3 kg/m.  Current Medications (verified) Outpatient Encounter Medications as of 09/22/2024  Medication Sig   acetaminophen  (TYLENOL ) 500 MG tablet Take 1,000 mg by mouth every 6 (six)  hours as needed for moderate pain or headache.   amLODipine  (NORVASC ) 2.5 MG tablet Take 1 tablet (2.5 mg total) by mouth daily.   aspirin  EC 81 MG tablet Take 81 mg by mouth daily. Swallow whole.   cholecalciferol (VITAMIN D ) 1000 units tablet Take 2 tablets (2,000 Units total) by mouth daily.   Cyanocobalamin  (VITAMIN B-12) 1000 MCG SUBL Place 1 tablet (1,000 mcg total) under the tongue daily.   famotidine  (PEPCID ) 20 MG tablet Take 1 tablet (20 mg total) by mouth daily.   fish oil-omega-3 fatty acids 1000 MG capsule Take 1 g by mouth daily.   fluticasone  (FLONASE ) 50 MCG/ACT nasal spray Place 1 spray into both nostrils daily as needed for allergies.   hydrOXYzine  (ATARAX ) 25 MG tablet Take 1-2 tablets at night as needed for itching. Caution as this medication can make you drowsy   loratadine  (CLARITIN ) 10 MG tablet Take 1 tablet by mouth daily.   LORazepam  (ATIVAN ) 1 MG tablet TAKE 1 TABLET BY MOUTH TWICE A DAY AS NEEDED FOR ANXIETY   ondansetron  (ZOFRAN ) 4 MG tablet Take 1 tablet (4 mg total) by mouth every 8 (eight) hours as needed for nausea or vomiting.   pantoprazole  (PROTONIX ) 40 MG tablet Take 1 tablet (40 mg total) by mouth 2 (two) times daily.   phentermine  (ADIPEX-P ) 37.5 MG tablet TAKE 1 TABLET BY MOUTH EVERY DAY BEFORE BREAKFAST   Probiotic Product (ALIGN) 4 MG CAPS Take 1 capsule (4 mg total) by mouth daily.   rosuvastatin  (CRESTOR ) 5 MG tablet Take 1 tablet (5 mg total) by mouth daily.   telmisartan  (MICARDIS ) 40 MG tablet Take 1 tablet (40 mg total) by mouth daily.   triamcinolone  ointment (KENALOG ) 0.1 % Apply topically 2 (two) times daily.   triamterene -hydrochlorothiazide (MAXZIDE-25) 37.5-25 MG tablet Take 0.5 tablets by mouth daily.   venlafaxine  XR (EFFEXOR -XR) 75 MG 24 hr capsule Take 1 capsule (75 mg total) by mouth daily with breakfast.   ipratropium (ATROVENT ) 0.03 % nasal spray Place 2 sprays into both nostrils 4 (four) times daily. (Patient not taking: Reported on  09/22/2024)   No facility-administered encounter medications on file as of 09/22/2024.   Hearing/Vision screen Hearing Screening - Comments:: Denies hearing difficulties   Vision Screening - Comments:: Wears rx glasses - up to date with routine eye exams with [facility] Glendia Gaudy Immunizations and Health Maintenance Health Maintenance  Topic Date Due   DTaP/Tdap/Td (2 - Td or Tdap) 07/02/2022  COVID-19 Vaccine (8 - 2025-26 season) 07/13/2024   Medicare Annual Wellness (AWV)  09/22/2025   Colonoscopy  07/21/2031   Pneumococcal Vaccine: 50+ Years  Completed   Influenza Vaccine  Completed   DEXA SCAN  Completed   Hepatitis C Screening  Completed   Zoster Vaccines- Shingrix   Completed   Meningococcal B Vaccine  Aged Out   Mammogram  Discontinued        Assessment/Plan:  This is a routine wellness examination for U[place].  Patient Care Team: Plotnikov, Karlynn GAILS, MD as PCP - General Obie, Princella HERO, MD (Inactive) (Gastroenterology) Lomax, Carlin ORN, MD (Inactive) as Surgeon (Obstetrics and Gynecology) Key, Hargis HERO, NP as Nurse Practitioner (Gynecology) Beverley Evalene BIRCH, MD as Attending Physician (Orthopedic Surgery) Joane Artist RAMAN, MD as Consulting Physician (Sports Medicine) Jane Charleston, MD as Consulting Physician (Orthopedic Surgery) Octavia Charleston, MD as Consulting Physician (Ophthalmology) Kassie Acquanetta Bradley, MD as Consulting Physician (Pulmonary Disease)  I have personally reviewed and noted the following in the patient's chart:   Medical and social history Use of alcohol, tobacco or illicit drugs  Current medications and supplements including opioid prescriptions. Functional ability and status Nutritional status Physical activity Advanced directives List of other physicians Hospitalizations, surgeries, and ER visits in previous 12 months Vitals Screenings to include cognitive, depression, and falls Referrals and appointments  No orders of the defined types were  placed in this encounter.  In addition, I have reviewed and discussed with patient certain preventive protocols, quality metrics, and best practice recommendations. A written personalized care plan for preventive services as well as general preventive health recommendations were provided to patient.   Verdie HERO Saba, CMA   09/22/2024   Return in 1 year (on 09/22/2025).  After Visit Summary: (MyChart) Due to this being a telephonic visit, the after visit summary with patients personalized plan was offered to patient via MyChart   Nurse Notes: Scheduled 2026 AWV/CPE appts   Medical screening examination/treatment/procedure(s) were performed by non-physician practitioner and as supervising physician I was immediately available for consultation/collaboration.  I agree with above. Karlynn Noel, MD

## 2024-09-22 NOTE — Patient Instructions (Addendum)
 Jane Mooney,  Thank you for taking the time for your Medicare Wellness Visit. I appreciate your continued commitment to your health goals. Please review the care plan we discussed, and feel free to reach out if I can assist you further.  Please note that Annual Wellness Visits do not include a physical exam. Some assessments may be limited, especially if the visit was conducted virtually. If needed, we may recommend an in-person follow-up with your provider.  Ongoing Care Seeing your primary care provider every 3 to 6 months helps us  monitor your health and provide consistent, personalized care.   Referrals If a referral was made during today's visit and you haven't received any updates within two weeks, please contact the referred provider directly to check on the status.  Recommended Screenings:  Health Maintenance  Topic Date Due   DTaP/Tdap/Td vaccine (2 - Td or Tdap) 07/02/2022   COVID-19 Vaccine (8 - 2025-26 season) 07/13/2024   Medicare Annual Wellness Visit  09/22/2025   Colon Cancer Screening  07/21/2031   Pneumococcal Vaccine for age over 63  Completed   Flu Shot  Completed   DEXA scan (bone density measurement)  Completed   Hepatitis C Screening  Completed   Zoster (Shingles) Vaccine  Completed   Meningitis B Vaccine  Aged Out   Breast Cancer Screening  Discontinued       09/22/2024   11:34 AM  Advanced Directives  Does Patient Have a Medical Advance Directive? Yes  Type of Estate Agent of [place];Living will  Does patient want to make changes to medical advance directive? Yes (Inpatient - patient requests chaplain consult to change a medical advance directive)  Copy of Healthcare Power of Attorney in Chart? No - copy requested    Vision: Annual vision screenings are recommended for early detection of glaucoma, cataracts, and diabetic retinopathy. These exams can also reveal signs of chronic conditions such as diabetes and high blood  pressure.  Dental: Annual dental screenings help detect early signs of oral cancer, gum disease, and other conditions linked to overall health, including heart disease and diabetes.

## 2024-09-24 ENCOUNTER — Other Ambulatory Visit (HOSPITAL_BASED_OUTPATIENT_CLINIC_OR_DEPARTMENT_OTHER): Payer: Self-pay

## 2024-09-24 MED ORDER — BOOSTRIX 5-2.5-18.5 LF-MCG/0.5 IM SUSY
0.5000 mL | PREFILLED_SYRINGE | INTRAMUSCULAR | 0 refills | Status: AC
  Filled 2024-09-24: qty 0.5, 1d supply, fill #0

## 2024-09-25 ENCOUNTER — Other Ambulatory Visit (HOSPITAL_BASED_OUTPATIENT_CLINIC_OR_DEPARTMENT_OTHER): Payer: Self-pay

## 2024-09-29 DIAGNOSIS — R011 Cardiac murmur, unspecified: Secondary | ICD-10-CM | POA: Diagnosis not present

## 2024-09-29 DIAGNOSIS — F321 Major depressive disorder, single episode, moderate: Secondary | ICD-10-CM | POA: Diagnosis not present

## 2024-09-29 DIAGNOSIS — I129 Hypertensive chronic kidney disease with stage 1 through stage 4 chronic kidney disease, or unspecified chronic kidney disease: Secondary | ICD-10-CM | POA: Diagnosis not present

## 2024-09-29 DIAGNOSIS — F411 Generalized anxiety disorder: Secondary | ICD-10-CM | POA: Diagnosis not present

## 2024-09-29 DIAGNOSIS — E785 Hyperlipidemia, unspecified: Secondary | ICD-10-CM | POA: Diagnosis not present

## 2024-09-29 DIAGNOSIS — Z7982 Long term (current) use of aspirin: Secondary | ICD-10-CM | POA: Diagnosis not present

## 2024-09-29 DIAGNOSIS — K219 Gastro-esophageal reflux disease without esophagitis: Secondary | ICD-10-CM | POA: Diagnosis not present

## 2024-09-29 DIAGNOSIS — E669 Obesity, unspecified: Secondary | ICD-10-CM | POA: Diagnosis not present

## 2024-09-29 DIAGNOSIS — N1831 Chronic kidney disease, stage 3a: Secondary | ICD-10-CM | POA: Diagnosis not present

## 2024-10-01 ENCOUNTER — Encounter: Payer: Self-pay | Admitting: Internal Medicine

## 2024-10-01 NOTE — Progress Notes (Signed)
 Pharmacy Quality Measure Review  This patient is appearing on a report for being at risk of failing the adherence measure for hypertension (ACEi/ARB) medications this calendar year.   Medication: Telmisartan  40mg  Last fill date: 11/08 for 90 day supply  Insurance report was not up to date. No action needed at this time.   Angela Baalmann, PharmD [facility] Pharmacist

## 2024-10-15 ENCOUNTER — Encounter: Payer: Self-pay | Admitting: Internal Medicine

## 2024-10-15 ENCOUNTER — Ambulatory Visit: Admitting: Internal Medicine

## 2024-10-15 VITALS — BP 142/80 | HR 68 | Temp 98.9°F | Ht 62.5 in | Wt 184.2 lb

## 2024-10-15 DIAGNOSIS — E6609 Other obesity due to excess calories: Secondary | ICD-10-CM

## 2024-10-15 DIAGNOSIS — E538 Deficiency of other specified B group vitamins: Secondary | ICD-10-CM | POA: Diagnosis not present

## 2024-10-15 DIAGNOSIS — Z6832 Body mass index (BMI) 32.0-32.9, adult: Secondary | ICD-10-CM | POA: Diagnosis not present

## 2024-10-15 DIAGNOSIS — E559 Vitamin D deficiency, unspecified: Secondary | ICD-10-CM | POA: Diagnosis not present

## 2024-10-15 DIAGNOSIS — J40 Bronchitis, not specified as acute or chronic: Secondary | ICD-10-CM | POA: Diagnosis not present

## 2024-10-15 DIAGNOSIS — I10 Essential (primary) hypertension: Secondary | ICD-10-CM

## 2024-10-15 DIAGNOSIS — E66811 Obesity, class 1: Secondary | ICD-10-CM

## 2024-10-15 DIAGNOSIS — Z1152 Encounter for screening for COVID-19: Secondary | ICD-10-CM

## 2024-10-15 DIAGNOSIS — F419 Anxiety disorder, unspecified: Secondary | ICD-10-CM

## 2024-10-15 LAB — POC COVID19 BINAXNOW: SARS Coronavirus 2 Ag: NEGATIVE

## 2024-10-15 MED ORDER — AZITHROMYCIN 250 MG PO TABS: ORAL_TABLET | ORAL | 0 refills | Status: AC

## 2024-10-15 MED ORDER — HYDROCODONE BIT-HOMATROP MBR 5-1.5 MG/5ML PO SOLN: 5.0000 mL | Freq: Three times a day (TID) | ORAL | 0 refills | Status: AC | PRN

## 2024-10-15 NOTE — Progress Notes (Signed)
 Subjective:  Patient ID: Jane Mooney, female    DOB: 01-10-1951  Age: 73 y.o. MRN: [id number]  CC: Medical Management of Chronic Issues (3 Month follow up. New URI type symptoms starting Tuesday (sore throat, constant sneezing, dry cough, chills, body ache, headache))   HPI DAUN RENS presents for new URI type symptoms starting Tuesday (sore throat, constant sneezing, dry cough, chills, body ache, headache.  Outpatient Medications Prior to Visit  Medication Sig Dispense Refill   acetaminophen  (TYLENOL ) 500 MG tablet Take 1,000 mg by mouth every 6 (six) hours as needed for moderate pain or headache.     amLODipine  (NORVASC ) 2.5 MG tablet Take 1 tablet (2.5 mg total) by mouth daily. 90 tablet 3   aspirin  EC 81 MG tablet Take 81 mg by mouth daily. Swallow whole.     cholecalciferol (VITAMIN D ) 1000 units tablet Take 2 tablets (2,000 Units total) by mouth daily. 100 tablet 11   Cyanocobalamin  (VITAMIN B-12) 1000 MCG SUBL Place 1 tablet (1,000 mcg total) under the tongue daily. 100 tablet 3   famotidine  (PEPCID ) 20 MG tablet Take 1 tablet (20 mg total) by mouth daily. 90 tablet 2   fish oil-omega-3 fatty acids 1000 MG capsule Take 1 g by mouth daily.     fluticasone  (FLONASE ) 50 MCG/ACT nasal spray Place 1 spray into both nostrils daily as needed for allergies. 48 g 3   hydrOXYzine  (ATARAX ) 25 MG tablet Take 1-2 tablets at night as needed for itching. Caution as this medication can make you drowsy 180 tablet 3   loratadine  (CLARITIN ) 10 MG tablet Take 1 tablet by mouth daily.     LORazepam  (ATIVAN ) 1 MG tablet TAKE 1 TABLET BY MOUTH TWICE A DAY AS NEEDED FOR ANXIETY 60 tablet 2   ondansetron  (ZOFRAN ) 4 MG tablet Take 1 tablet (4 mg total) by mouth every 8 (eight) hours as needed for nausea or vomiting. 20 tablet 0   pantoprazole  (PROTONIX ) 40 MG tablet Take 1 tablet (40 mg total) by mouth 2 (two) times daily. 60 tablet 5   Probiotic Product (ALIGN) 4 MG CAPS Take 1 capsule (4 mg total) by  mouth daily. 30 capsule 1   rosuvastatin  (CRESTOR ) 5 MG tablet Take 1 tablet (5 mg total) by mouth daily. 90 tablet 3   Tdap (BOOSTRIX ) 5-2.5-18.5 LF-MCG/0.5 injection Inject 0.5 mLs into the muscle. 0.5 mL 0   telmisartan  (MICARDIS ) 40 MG tablet Take 1 tablet (40 mg total) by mouth daily. 90 tablet 3   triamcinolone  ointment (KENALOG ) 0.1 % Apply topically 2 (two) times daily. 80 g 1   triamterene -hydrochlorothiazide (MAXZIDE-25) 37.5-25 MG tablet Take 0.5 tablets by mouth daily. 90 tablet 3   venlafaxine  XR (EFFEXOR -XR) 75 MG 24 hr capsule Take 1 capsule (75 mg total) by mouth daily with breakfast. 90 capsule 1   phentermine  (ADIPEX-P ) 37.5 MG tablet TAKE 1 TABLET BY MOUTH EVERY DAY BEFORE BREAKFAST 30 tablet 2   ipratropium (ATROVENT ) 0.03 % nasal spray Place 2 sprays into both nostrils 4 (four) times daily. (Patient not taking: Reported on 10/15/2024) 90 mL 1   No facility-administered medications prior to visit.    ROS: Review of Systems  Constitutional:  Positive for chills and fatigue. Negative for activity change, appetite change, fever and unexpected weight change.  HENT:  Positive for congestion, rhinorrhea and sore throat. Negative for mouth sores, sinus pressure, sinus pain and voice change.   Eyes:  Negative for visual disturbance.  Respiratory:  Positive  for cough. Negative for chest tightness.   Cardiovascular:  Negative for chest pain.  Gastrointestinal:  Negative for abdominal pain and nausea.  Genitourinary:  Negative for difficulty urinating, frequency and vaginal pain.  Musculoskeletal:  Negative for back pain and gait problem.  Skin:  Negative for pallor and rash.  Neurological:  Positive for headaches. Negative for dizziness, tremors, weakness and numbness.  Psychiatric/Behavioral:  Negative for confusion and sleep disturbance.     Objective:  BP (!) 142/80   Pulse 68   Temp 98.9 F (37.2 C)   Ht 5' 2.5 (1.588 m)   Wt 184 lb 3.2 oz (83.6 kg)   SpO2 97%   BMI  33.15 kg/m   BP Readings from Last 3 Encounters:  10/15/24 (!) 142/80  09/22/24 110/62  07/20/24 (!) 156/64    Wt Readings from Last 3 Encounters:  10/15/24 184 lb 3.2 oz (83.6 kg)  09/22/24 185 lb (83.9 kg)  09/22/24 185 lb 6.4 oz (84.1 kg)    Physical Exam Constitutional:      General: She is not in acute distress.    Appearance: She is well-developed. She is obese. She is not toxic-appearing.  HENT:     Head: Normocephalic.     Right Ear: External ear normal.     Left Ear: External ear normal.     Nose: Nose normal.  Eyes:     General:        Right eye: No discharge.        Left eye: No discharge.     Conjunctiva/sclera: Conjunctivae normal.     Pupils: Pupils are equal, round, and reactive to light.  Neck:     Thyroid : No thyromegaly.     Vascular: No JVD.     Trachea: No tracheal deviation.  Cardiovascular:     Rate and Rhythm: Normal rate and regular rhythm.     Heart sounds: Normal heart sounds.  Pulmonary:     Effort: No respiratory distress.     Breath sounds: No stridor. No wheezing.  Abdominal:     General: Bowel sounds are normal. There is no distension.     Palpations: Abdomen is soft. There is no mass.     Tenderness: There is no abdominal tenderness. There is no guarding or rebound.  Musculoskeletal:        General: No tenderness.     Cervical back: Normal range of motion and neck supple. No rigidity.  Lymphadenopathy:     Cervical: No cervical adenopathy.  Skin:    Findings: No erythema or rash.  Neurological:     Cranial Nerves: No cranial nerve deficit.     Motor: No abnormal muscle tone.     Coordination: Coordination normal.     Deep Tendon Reflexes: Reflexes normal.  Psychiatric:        Behavior: Behavior normal.        Thought Content: Thought content normal.        Judgment: Judgment normal.   Looks tired  Lab Results  Component Value Date   WBC 4.8 07/16/2024   HGB 12.2 07/16/2024   HCT 38.3 07/16/2024   PLT 175.0 07/16/2024    GLUCOSE 115 (H) 07/16/2024   CHOL 131 04/20/2022   TRIG 82.0 04/20/2022   HDL 51.70 04/20/2022   LDLDIRECT 153.6 07/06/2013   LDLCALC 63 04/20/2022   ALT 18 07/16/2024   AST 23 07/16/2024   NA 142 07/16/2024   K 3.9 07/16/2024   CL 104 07/16/2024  CREATININE 1.09 07/16/2024   BUN 19 07/16/2024   CO2 30 07/16/2024   TSH 3.36 09/23/2023   INR 1.1 (H) 09/23/2023   HGBA1C 6.2 07/16/2024    DG Bone Density Result Date: 09/17/2024 EXAM: DUAL X-RAY ABSORPTIOMETRY (DXA) FOR BONE MINERAL DENSITY 09/17/2024 1:31 pm CLINICAL DATA:  73 year old Female Postmenopausal. Postmenopausal Patient is or has been on glucocorticoid therapy. TECHNIQUE: An axial (e.g., hips, spine) and/or appendicular (e.g., radius) exam was performed, as appropriate, using GE Secretary/administrator at [employer]. Images are obtained for bone mineral density measurement and are not obtained for diagnostic purposes. [id number] Exclusions: None. COMPARISON:  None. New baseline. FINDINGS: Scan quality: Good. LUMBAR SPINE (L1-L4): BMD (in g/cm2): 0.845 T-score: -2.8 Z-score: -1.7 LEFT FEMORAL NECK: BMD (in g/cm2): 0.955 T-score: -0.6 Z-score: 0.9 LEFT TOTAL HIP: BMD (in g/cm2): 1.006 T-score: 0.0 Z-score: 1.3 RIGHT FEMORAL NECK: BMD (in g/cm2): 0.961 T-score: -0.6 Z-score: 1.0 RIGHT TOTAL HIP: BMD (in g/cm2): 0.994 T-score: -0.1 Z-score: 1.2 LEFT FOREARM (RADIUS 33%): BMD (in g/cm2): 0.888 T-score: 0.1 Z-score: 2.4 FRAX 10-YEAR PROBABILITY OF FRACTURE: FRAX not reported as the lowest BMD is not in the osteopenia range. IMPRESSION: Osteoporosis based on BMD. Fracture risk is increased. Increased risk is based on low BMD. RECOMMENDATIONS: 1. All patients should optimize calcium  and vitamin D  intake. 2. Consider FDA-approved medical therapies in postmenopausal women and men aged 64 years and older, based on the following: - A hip or vertebral (clinical or morphometric) fracture - T-score less than or equal to -2.5  and secondary causes have been excluded. - Low bone mass (T-score between -1.0 and -2.5) and a 10-year probability of a hip fracture greater than or equal to 3% or a 10-year probability of a major osteoporosis-related fracture greater than or equal to 20% based on the [place] -adapted WHO algorithm. - Clinician judgment and/or patient preferences may indicate treatment for people with 10-year fracture probabilities above or below these levels 3. Patients with diagnosis of osteoporosis or at high risk for fracture should have regular bone mineral density tests. For patients eligible for Medicare, routine testing is allowed once every 2 years. The testing frequency can be increased to one year for patients who have rapidly progressing disease, those who are receiving or discontinuing medical therapy to restore bone mass, or have additional risk factors. Electronically Signed   By: Debby Satterfield M.D.   On: 09/17/2024 14:58    Assessment & Plan:   Problem List Items Addressed This Visit     Anxiety disorder   On Effexor  XR Lorazepam  prn  Potential benefits of a long term benzodiazepines  use as well as potential risks  and complications were explained to the patient and were aknowledged.      Bronchitis - Primary   Zpack Hycodan syr  COVID (-) POC      Essential hypertension   Micardis  40 mg/day Recheck BP at home      Obesity   D/c Phentermine  - not helping      Vitamin B 12 deficiency   On B12      Vitamin D  deficiency   Continue vitamin D          Meds ordered this encounter  Medications   azithromycin  (ZITHROMAX  Z-PAK) 250 MG tablet    Sig: As directed    Dispense:  6 tablet    Refill:  0   HYDROcodone  bit-homatropine (HYCODAN) 5-1.5 MG/5ML syrup    Sig: Take 5 mLs by mouth  every 8 (eight) hours as needed for cough.    Dispense:  240 mL    Refill:  0      Follow-up: Return in about 3 months (around 01/13/2025) for a follow-up visit.  Marolyn Noel, MD

## 2024-10-15 NOTE — Assessment & Plan Note (Signed)
 D/c Phentermine  - not helping

## 2024-10-15 NOTE — Assessment & Plan Note (Signed)
 On Effexor XR Lorazepam prn  Potential benefits of a long term benzodiazepines  use as well as potential risks  and complications were explained to the patient and were aknowledged.

## 2024-10-15 NOTE — Assessment & Plan Note (Signed)
 Micardis  40 mg/day Recheck BP at home

## 2024-10-15 NOTE — Addendum Note (Signed)
 Addended byBETHA LUCETTA CLEATRICE LELON on: 10/15/2024 09:51 AM   Modules accepted: Orders

## 2024-10-15 NOTE — Assessment & Plan Note (Signed)
 Zpack Hycodan syr  COVID (-) POC

## 2024-10-15 NOTE — Assessment & Plan Note (Signed)
 Continue vitamin D.

## 2024-10-15 NOTE — Assessment & Plan Note (Signed)
 On B12

## 2024-10-19 ENCOUNTER — Ambulatory Visit

## 2024-10-27 ENCOUNTER — Encounter (HOSPITAL_BASED_OUTPATIENT_CLINIC_OR_DEPARTMENT_OTHER): Payer: Self-pay | Admitting: Pulmonary Disease

## 2024-10-27 ENCOUNTER — Ambulatory Visit (INDEPENDENT_AMBULATORY_CARE_PROVIDER_SITE_OTHER): Admitting: Pulmonary Disease

## 2024-10-27 VITALS — BP 149/73 | HR 67 | Temp 98.9°F | Ht 62.0 in | Wt 183.6 lb

## 2024-10-27 DIAGNOSIS — R911 Solitary pulmonary nodule: Secondary | ICD-10-CM

## 2024-10-27 NOTE — Patient Instructions (Signed)
 Incidental RML nodule 8mm --Reviewed available CT imaging. Nodule stable >2 years. Low chance of malignancy at this point  Mild restrictive defect with reduced DLCO - resolved Normalized PFTs. Suspect this is erroneous or resolved defect CT chest with no evidence of ILD --No further PFTs needed

## 2024-10-27 NOTE — Progress Notes (Signed)
 Subjective:   PATIENT ID: Jane Mooney GENDER: female DOB: 1951-03-22, MRN: [id number]   HPI  Chief Complaint  Patient presents with   Follow-up    Rt nodule   Reason for Visit: Follow-up lung nodule, chronic cough  Jane Mooney is a 73 year old female with allergic rhinitis, history of recurrent urticaria, hypertension and CKD who presents for follow-up.  Synopsis 09/26/21 For the last six months she gradually developed chronic cough that is unproductive. Denies preceding illness. Denies wheezing or shortness of breath. Occurs mostly at night which wakes her up with episodes that last for few minutes until she takes a ricola. No daytime cough. Drinks hot tea or water  at bedtime. The cough is severe enough to cause her a sore throat later. She has been following Dr. Iva in [facility]. Compliant with her current regimen with zyrtec . Still has some post-nasal drainage. No recent urticaria >1 year. Her PCP started on her pepcid  3-4 months ago.   10/17/21 She presents to review CT scan. She is anxious and was not able to sleep last night. She does report improvement in her cough and has not coughed in the last two days.  04/23/22 Since our last visit her nonproductive cough is present but not severe. Occasionally still takes cough syrup and compliant with her H2 blocker and antihistamines per Allergy. Had CT chest completed last week and awaiting discussion on results. Denies shortness of breath, wheezing, hemoptysis. No changes in appetite, weight. No night sweats.   12/18/22 Since our last visit she continues to have mild nonproductive cough that worsens when laying down at night. Does have nasal congestion. On pepcid  nightly. Denies shortness of breath, cough or wheezing.  10/23/23 Since our last visit she reports mild cough. No wheezing or shortness of breath  10/27/24 Since our last visit she reports overall doing well. Occasional cough and episode of bronchitis two weeks ago  that has completely resolved after azithromycin .  Social History: Never smoker No pets Retired Product/process Development Scientist exposures: None. Denies dust or industrial exposures.  Past Medical History:  Diagnosis Date   Allergic rhinitis    Allergy    Anemia    Anxiety 2010   Chronic kidney disease    renal insufficiency    Depression    DJD (degenerative joint disease)    Left knee   GERD (gastroesophageal reflux disease)    Heart murmur    as a child    Hemorrhoid    HTN (hypertension)    STABLE   Hyperlipidemia    LBP (low back pain)    MVP (mitral valve prolapse)    PONV (postoperative nausea and vomiting)    Symptomatic PVCs    Urticaria    recurrent     Family History  Problem Relation Age of Onset   Hypertension Mother    Bone cancer Maternal Aunt    Breast cancer Maternal Grandmother    Diabetes Other    Hypertension Other    Breast cancer Other    Cancer Other        breast    Diabetes Maternal Grandfather    Hypertension Brother    Colon cancer Neg Hx    Esophageal cancer Neg Hx    Stomach cancer Neg Hx    Rectal cancer Neg Hx      Social History   Occupational History   Occupation: Retired Teacher, Adult Education: [employer]    Comment: 01/2010  Tobacco Use  Smoking status: Never   Smokeless tobacco: Never  Vaping Use   Vaping status: Never Used  Substance and Sexual Activity   Alcohol use: Not Currently   Drug use: No   Sexual activity: Yes    Birth control/protection: Surgical    Comment: Vaginal hysterectomy    Allergies  Allergen Reactions   Citalopram  Other (See Comments)    weak   Mirtazapine Other (See Comments)    Made pt very groggy   Quinapril Hcl Cough     Outpatient Medications Prior to Visit  Medication Sig Dispense Refill   acetaminophen  (TYLENOL ) 500 MG tablet Take 1,000 mg by mouth every 6 (six) hours as needed for moderate pain or headache.     amLODipine  (NORVASC ) 2.5 MG tablet Take 1 tablet (2.5 mg total) by mouth  daily. 90 tablet 3   cholecalciferol (VITAMIN D ) 1000 units tablet Take 2 tablets (2,000 Units total) by mouth daily. 100 tablet 11   Cyanocobalamin  (VITAMIN B-12) 1000 MCG SUBL Place 1 tablet (1,000 mcg total) under the tongue daily. 100 tablet 3   famotidine  (PEPCID ) 20 MG tablet Take 1 tablet (20 mg total) by mouth daily. 90 tablet 2   fish oil-omega-3 fatty acids 1000 MG capsule Take 1 g by mouth daily.     fluticasone  (FLONASE ) 50 MCG/ACT nasal spray Place 1 spray into both nostrils daily as needed for allergies. 48 g 3   HYDROcodone  bit-homatropine (HYCODAN) 5-1.5 MG/5ML syrup Take 5 mLs by mouth every 8 (eight) hours as needed for cough. 240 mL 0   hydrOXYzine  (ATARAX ) 25 MG tablet Take 1-2 tablets at night as needed for itching. Caution as this medication can make you drowsy 180 tablet 3   loratadine  (CLARITIN ) 10 MG tablet Take 1 tablet by mouth daily.     LORazepam  (ATIVAN ) 1 MG tablet TAKE 1 TABLET BY MOUTH TWICE A DAY AS NEEDED FOR ANXIETY 60 tablet 2   pantoprazole  (PROTONIX ) 40 MG tablet Take 1 tablet (40 mg total) by mouth 2 (two) times daily. 60 tablet 5   Probiotic Product (ALIGN) 4 MG CAPS Take 1 capsule (4 mg total) by mouth daily. 30 capsule 1   rosuvastatin  (CRESTOR ) 5 MG tablet Take 1 tablet (5 mg total) by mouth daily. 90 tablet 3   Tdap (BOOSTRIX ) 5-2.5-18.5 LF-MCG/0.5 injection Inject 0.5 mLs into the muscle. 0.5 mL 0   telmisartan  (MICARDIS ) 40 MG tablet Take 1 tablet (40 mg total) by mouth daily. 90 tablet 3   triamcinolone  ointment (KENALOG ) 0.1 % Apply topically 2 (two) times daily. 80 g 1   triamterene -hydrochlorothiazide (MAXZIDE-25) 37.5-25 MG tablet Take 0.5 tablets by mouth daily. 90 tablet 3   venlafaxine  XR (EFFEXOR -XR) 75 MG 24 hr capsule Take 1 capsule (75 mg total) by mouth daily with breakfast. 90 capsule 1   aspirin  EC 81 MG tablet Take 81 mg by mouth daily. Swallow whole.     azithromycin  (ZITHROMAX  Z-PAK) 250 MG tablet As directed (Patient not taking:  Reported on 10/27/2024) 6 tablet 0   ipratropium (ATROVENT ) 0.03 % nasal spray Place 2 sprays into both nostrils 4 (four) times daily. (Patient not taking: Reported on 10/27/2024) 90 mL 1   ondansetron  (ZOFRAN ) 4 MG tablet Take 1 tablet (4 mg total) by mouth every 8 (eight) hours as needed for nausea or vomiting. (Patient not taking: Reported on 10/27/2024) 20 tablet 0   No facility-administered medications prior to visit.    Review of Systems  Constitutional:  Negative for  chills, diaphoresis, fever, malaise/fatigue and weight loss.  HENT:  Negative for congestion.   Respiratory:  Negative for cough, hemoptysis, sputum production, shortness of breath and wheezing.   Cardiovascular:  Negative for chest pain, palpitations and leg swelling.     Objective:   Vitals:   10/27/24 1049  BP: (!) 149/73  Pulse: 67  Temp: 98.9 F (37.2 C)  SpO2: 100%  Weight: 183 lb 9.6 oz (83.3 kg)  Height: 5' 2 (1.575 m)   SpO2: 100 %  Physical Exam: General: Well-appearing, no acute distress HENT: [place], AT Eyes: EOMI, no scleral icterus Respiratory: Clear to auscultation bilaterally.  No crackles, wheezing or rales Cardiovascular: RRR, -M/R/G, no JVD Extremities:-Edema,-tenderness Neuro: AAO x4, CNII-XII grossly intact Psych: Normal mood, normal affect    Data Reviewed:  Imaging: CXR 05/02/2021-normal chest x-ray.  No infiltrate, effusion or edema.  No overt parenchymal abnormalities CT Chest HR 10/11/21 - No evidence of interstitial lung disease. RML nodule measuring 8 x 7mm. No prior CT to compare. CT Chest 04/20/22 - Unchanged 8 mm RML nodule CT Chest 10/15/22 - Stable 8 mm RML nodule CT Chest 06/18/23 - Stable 8 x 7 cm RML nodule CT Abd/Pelvis 04/01/24 - Stable 8 mm nodule in RML   PFT: 09/26/2021 FVC 2.49 (113%) FEV1 2.19 (129%) Ratio 76 TLC 74% DLCO 74% Interpretation: No obstructive defect.  Mild restrictive defect with mildly reduced gas exchange.  No significant bronchodilator  response.  Normal F-V loops.  12/18/22 FVC 2.52 (91%) FEV1 2.23 (107%) Ratio 85  TLC 88% DLCO 86% Interpretation: Normal PFTs   Labs: CBC    Component Value Date/Time   WBC 4.8 07/16/2024 0938   RBC 4.79 07/16/2024 0938   HGB 12.2 07/16/2024 0938   HCT 38.3 07/16/2024 0938   PLT 175.0 07/16/2024 0938   MCV 79.8 07/16/2024 0938   MCH 26.3 06/23/2020 0922   MCHC 31.9 07/16/2024 0938   RDW 14.6 07/16/2024 0938   LYMPHSABS 1.7 07/16/2024 0938   MONOABS 0.6 07/16/2024 0938   EOSABS 0.2 07/16/2024 0938   BASOSABS 0.0 07/16/2024 0938   Absolute eos  08/30/2021-200 12/12/21 - 100 07/16/24 -200    Assessment & Plan:   Discussion: 73 year old female never smoker with allergic rhinitis, hx of recurrent urticaria, HTN and CKD who presents for pulmonary nodule follow-up. Reviewed CT imaging with patient. Addressed questions and concerns. Nodule stable. No further imaging required.  Incidental RML nodule 8mm --Reviewed available CT imaging. Nodule stable >2 years. Low chance of malignancy at this point  Mild restrictive defect with reduced DLCO - resolved Normalized PFTs. Suspect this is erroneous or resolved defect CT chest with no evidence of ILD --No further PFTs needed  Health Maintenance Immunization History  Administered Date(s) Administered   Fluad  Quad(high Dose 65+) 07/29/2019, 09/20/2020, 07/18/2021, 08/24/2022   Fluad  Trivalent(High Dose 65+) 08/20/2023   INFLUENZA, HIGH DOSE SEASONAL PF 08/17/2016, 07/23/2017, 09/19/2018, 08/14/2024   Influenza Split 08/03/2011, 09/09/2012   Influenza Whole 07/05/2010   Influenza,inj,Quad PF,6+ Mos 07/10/2013, 07/28/2014, 09/29/2015   PFIZER Comirnaty (Gray Top)Covid-19 Tri-Sucrose Vaccine 03/29/2021   PFIZER(Purple Top)SARS-COV-2 Vaccination 12/26/2019, 01/18/2020, 08/27/2020   PNEUMOCOCCAL CONJUGATE-20 09/23/2023   Pfizer Covid-19 Vaccine Bivalent Booster 62yrs & up 08/01/2021   Pfizer(Comirnaty )Fall Seasonal Vaccine 12 years and  older 08/24/2022, 08/20/2023   Pneumococcal Conjugate-13 03/31/2015   Pneumococcal Polysaccharide-23 11/15/2014   Tdap 07/02/2012, 09/24/2024   Zoster Recombinant(Shingrix ) 12/07/2021, 02/02/2022   Zoster, Live 07/15/2012   CT Lung Screen- not indicated. Never smoker  No orders of the defined types were placed in this encounter.  No orders of the defined types were placed in this encounter.  Return if symptoms worsen or fail to improve.  I have spent a total time of 20-minutes on the day of the appointment including chart review, data review, collecting history, coordinating care and discussing medical diagnosis and plan with the patient/family. Past medical history, allergies, medications were reviewed. Pertinent imaging, labs and tests included in this note have been reviewed and interpreted independently by me.   Jane Mooney Staff, MD [facility] 10/27/2024 11:10 AM

## 2024-11-13 ENCOUNTER — Ambulatory Visit
Admission: RE | Admit: 2024-11-13 | Discharge: 2024-11-13 | Disposition: A | Discharge status: Home / Self Care | Source: Ambulatory Visit | Attending: Internal Medicine | Admitting: Internal Medicine

## 2024-11-13 DIAGNOSIS — Z1231 Encounter for screening mammogram for malignant neoplasm of breast: Secondary | ICD-10-CM

## 2024-12-04 ENCOUNTER — Other Ambulatory Visit: Payer: Self-pay | Admitting: Internal Medicine

## 2024-12-08 ENCOUNTER — Ambulatory Visit: Payer: Self-pay | Admitting: Internal Medicine

## 2025-01-13 ENCOUNTER — Ambulatory Visit: Admitting: Internal Medicine

## 2025-09-21 ENCOUNTER — Ambulatory Visit: Admitting: Allergy & Immunology

## 2025-10-18 ENCOUNTER — Ambulatory Visit

## 2025-10-18 ENCOUNTER — Encounter: Admitting: Internal Medicine
